# Patient Record
Sex: Male | Born: 1950 | Race: White | Hispanic: No | Marital: Married | Smoking: Current every day smoker
Health system: Southern US, Community
[De-identification: ages and names within clinical notes are randomized; demographics above are authoritative.]

## PROBLEM LIST (undated history)

## (undated) DIAGNOSIS — I10 Essential (primary) hypertension: Secondary | ICD-10-CM

## (undated) DIAGNOSIS — F32A Depression, unspecified: Secondary | ICD-10-CM

## (undated) DIAGNOSIS — J151 Pneumonia due to Pseudomonas: Secondary | ICD-10-CM

## (undated) DIAGNOSIS — R55 Syncope and collapse: Secondary | ICD-10-CM

## (undated) DIAGNOSIS — J449 Chronic obstructive pulmonary disease, unspecified: Secondary | ICD-10-CM

## (undated) DIAGNOSIS — F329 Major depressive disorder, single episode, unspecified: Secondary | ICD-10-CM

## (undated) DIAGNOSIS — E782 Mixed hyperlipidemia: Secondary | ICD-10-CM

## (undated) DIAGNOSIS — I480 Paroxysmal atrial fibrillation: Secondary | ICD-10-CM

## (undated) DIAGNOSIS — I6529 Occlusion and stenosis of unspecified carotid artery: Secondary | ICD-10-CM

## (undated) DIAGNOSIS — J9621 Acute and chronic respiratory failure with hypoxia: Secondary | ICD-10-CM

## (undated) DIAGNOSIS — I2510 Atherosclerotic heart disease of native coronary artery without angina pectoris: Secondary | ICD-10-CM

## (undated) DIAGNOSIS — R410 Disorientation, unspecified: Secondary | ICD-10-CM

## (undated) DIAGNOSIS — K219 Gastro-esophageal reflux disease without esophagitis: Secondary | ICD-10-CM

## (undated) HISTORY — DX: Essential (primary) hypertension: I10

## (undated) HISTORY — PX: INGUINAL HERNIA REPAIR: SHX194

## (undated) HISTORY — DX: Gastro-esophageal reflux disease without esophagitis: K21.9

## (undated) HISTORY — DX: Atherosclerotic heart disease of native coronary artery without angina pectoris: I25.10

## (undated) HISTORY — DX: Occlusion and stenosis of unspecified carotid artery: I65.29

## (undated) HISTORY — PX: ESOPHAGOGASTRODUODENOSCOPY (EGD) WITH ESOPHAGEAL DILATION: SHX5812

## (undated) HISTORY — PX: LEFT HEART CATH: SHX5946

## (undated) HISTORY — DX: Depression, unspecified: F32.A

## (undated) HISTORY — DX: Mixed hyperlipidemia: E78.2

## (undated) HISTORY — PX: VASECTOMY: SHX75

## (undated) HISTORY — DX: Syncope and collapse: R55

## (undated) HISTORY — DX: Chronic obstructive pulmonary disease, unspecified: J44.9

## (undated) HISTORY — PX: OTHER SURGICAL HISTORY: SHX169

## (undated) HISTORY — PX: CARDIAC CATHETERIZATION: SHX172

## (undated) HISTORY — DX: Major depressive disorder, single episode, unspecified: F32.9

---

## 1999-08-30 ENCOUNTER — Inpatient Hospital Stay (HOSPITAL_COMMUNITY): Admission: EM | Admit: 1999-08-30 | Discharge: 1999-09-03 | Payer: Self-pay | Admitting: *Deleted

## 2003-02-08 HISTORY — PX: ESOPHAGOGASTRODUODENOSCOPY (EGD) WITH ESOPHAGEAL DILATION: SHX5812

## 2003-02-08 HISTORY — PX: COLONOSCOPY: SHX174

## 2003-11-21 ENCOUNTER — Ambulatory Visit (HOSPITAL_COMMUNITY): Admission: RE | Admit: 2003-11-21 | Discharge: 2003-11-21 | Payer: Self-pay | Admitting: Internal Medicine

## 2003-12-24 ENCOUNTER — Ambulatory Visit: Payer: Self-pay | Admitting: Internal Medicine

## 2003-12-31 ENCOUNTER — Ambulatory Visit: Payer: Self-pay | Admitting: Internal Medicine

## 2004-01-07 ENCOUNTER — Ambulatory Visit: Payer: Self-pay | Admitting: Internal Medicine

## 2004-10-25 ENCOUNTER — Encounter: Payer: Self-pay | Admitting: Physician Assistant

## 2004-10-26 ENCOUNTER — Ambulatory Visit: Payer: Self-pay | Admitting: Cardiology

## 2004-10-27 ENCOUNTER — Inpatient Hospital Stay (HOSPITAL_COMMUNITY): Admission: AD | Admit: 2004-10-27 | Discharge: 2004-10-29 | Payer: Self-pay | Admitting: Internal Medicine

## 2004-10-28 ENCOUNTER — Ambulatory Visit: Payer: Self-pay | Admitting: Cardiology

## 2004-12-27 ENCOUNTER — Ambulatory Visit: Payer: Self-pay | Admitting: Cardiology

## 2005-12-27 ENCOUNTER — Encounter: Payer: Self-pay | Admitting: Cardiology

## 2005-12-27 ENCOUNTER — Ambulatory Visit: Payer: Self-pay | Admitting: Cardiology

## 2006-01-09 ENCOUNTER — Encounter: Payer: Self-pay | Admitting: Cardiology

## 2006-04-10 ENCOUNTER — Ambulatory Visit: Payer: Self-pay | Admitting: Cardiology

## 2007-03-21 ENCOUNTER — Encounter: Payer: Self-pay | Admitting: Cardiology

## 2008-11-19 ENCOUNTER — Encounter (INDEPENDENT_AMBULATORY_CARE_PROVIDER_SITE_OTHER): Payer: Self-pay | Admitting: *Deleted

## 2010-02-10 ENCOUNTER — Encounter: Payer: Self-pay | Admitting: Cardiology

## 2010-02-17 DIAGNOSIS — I10 Essential (primary) hypertension: Secondary | ICD-10-CM | POA: Insufficient documentation

## 2010-02-17 DIAGNOSIS — E785 Hyperlipidemia, unspecified: Secondary | ICD-10-CM | POA: Insufficient documentation

## 2010-02-18 ENCOUNTER — Ambulatory Visit
Admission: RE | Admit: 2010-02-18 | Discharge: 2010-02-18 | Payer: Self-pay | Source: Home / Self Care | Attending: Cardiology | Admitting: Cardiology

## 2010-02-18 ENCOUNTER — Encounter: Payer: Self-pay | Admitting: Cardiology

## 2010-02-18 ENCOUNTER — Encounter (INDEPENDENT_AMBULATORY_CARE_PROVIDER_SITE_OTHER): Payer: Self-pay | Admitting: *Deleted

## 2010-02-18 DIAGNOSIS — I200 Unstable angina: Secondary | ICD-10-CM | POA: Insufficient documentation

## 2010-02-18 DIAGNOSIS — I2510 Atherosclerotic heart disease of native coronary artery without angina pectoris: Secondary | ICD-10-CM | POA: Insufficient documentation

## 2010-02-18 DIAGNOSIS — F17200 Nicotine dependence, unspecified, uncomplicated: Secondary | ICD-10-CM | POA: Insufficient documentation

## 2010-02-19 ENCOUNTER — Encounter: Payer: Self-pay | Admitting: Cardiology

## 2010-02-25 ENCOUNTER — Encounter: Payer: Self-pay | Admitting: Cardiology

## 2010-02-25 ENCOUNTER — Ambulatory Visit
Admission: RE | Admit: 2010-02-25 | Payer: Self-pay | Source: Home / Self Care | Attending: Cardiology | Admitting: Cardiology

## 2010-03-02 ENCOUNTER — Telehealth (INDEPENDENT_AMBULATORY_CARE_PROVIDER_SITE_OTHER): Payer: Self-pay | Admitting: *Deleted

## 2010-03-02 ENCOUNTER — Encounter (INDEPENDENT_AMBULATORY_CARE_PROVIDER_SITE_OTHER): Payer: Self-pay | Admitting: *Deleted

## 2010-03-04 NOTE — H&P (Addendum)
  NAME:  Kyle Lowery, Kyle Lowery               ACCOUNT NO.:  [id number]  MEDICAL RECORD NO.:  [id number]          PATIENT TYPE:  OIB  LOCATION:  1965                         FACILITY:  [facility]  PHYSICIAN:  Rollene Rotunda, MD, FACCDATE OF BIRTH:  1950/06/16  DATE OF ADMISSION:  02/25/2010 DATE OF DISCHARGE:  02/25/2010                             HISTORY & PHYSICAL   PRIMARY:  Dr. Barbara Cower.  CARDIOLOGIST:  Jonelle Sidle, MD  PROCEDURE:  Left heart catheterization/coronary arteriography.  INDICATION:  Evaluate patient with chest pain and previous nonobstructive LAD stenosis.  PROCEDURE NOTE:  Left heart catheterization was performed via the right femoral artery.  The artery was cannulated using anterior wall puncture. A #4-French arterial sheath was inserted via the modified Seldinger technique.  Preformed Judkins and pigtail catheter were utilized.  The patient tolerated the procedure well, left the lab in stable condition. RESULTS:  Hemodynamics:  AO 109/78, LV 109/11.  Coronaries:  Left main was normal.  The LAD had a proximal 50% stenosis at the takeoff of a large first diagonal.  There was a long 25% mid to distal stenosis. First diagonal was large with ostial 80% stenosis.  The circumflex had a long proximal 60% stenosis in the AV groove.  This was after a small first obtuse marginal.  Obtuse marginal 1 and obtuse marginal 2 were small and normal.  Posterolateral was moderate-sized and normal.  The right coronary artery was dominant.  It was moderate-sized.  There was a long proximal 40% stenosis.  There was mid and distal long scattered 30% lesions.  The PDA was moderate size and normal.  Left ventriculogram: The left ventriculogram was obtained in the RAO projection.  The EF was 70% and normal.  CONCLUSION:  Single-vessel coronary artery disease at the diagonal branch.  Of note, I reviewed the previous catheterization.  There appeared to be a similar diagonal lesion though it was  difficult to view in the old images.  The LAD lesion itself appeared unchanged.  PLAN:  At this point, it would be very difficult to treat this ostial diagonal lesion with other than a cutting balloon.  The possibility for restenosis is high.  It would be prudent to further evaluate to see if there is ischemia in this distribution before proceeding with any percutaneous revascularization. If there is no ischemia on stress perfusion imaging, I would continue with aggressive medical management and only consider intervention if his symptoms worsen.     Rollene Rotunda, MD, [facility]     JH/MEDQ  D:  02/25/2010  T:  02/26/2010  Job:  [id number]  cc:   Jonelle Sidle, MD Dr. Barbara Cower  Electronically Signed by Rollene Rotunda MD [facility] on 03/04/2010 12:30:03 PM

## 2010-03-11 ENCOUNTER — Encounter: Payer: Self-pay | Admitting: Cardiology

## 2010-03-11 DIAGNOSIS — I2510 Atherosclerotic heart disease of native coronary artery without angina pectoris: Secondary | ICD-10-CM

## 2010-03-11 NOTE — Progress Notes (Signed)
Summary: PHONE:  Adrian Prows   Phone Note From Other Clinic   Caller: Trixie Rude CATH  Reason for Call: Schedule Patient Appt Summary of Call: Recv voice mail from [place] in [facility] cath Lab stating Dr. Antoine Poche wants to order Lexiscan cardiolite on Mr. Toto before his appt. on Feb. 14th with Dr. Diona Browner.  Initial call taken by: Zachary George,  March 02, 2010 11:05 AM

## 2010-03-11 NOTE — Letter (Signed)
Summary: Internal Correspondence/ FAXED PRE-CATH ORDER  Internal Correspondence/ FAXED PRE-CATH ORDER   Imported By: Dorise Hiss 02/26/2010 14:48:39  _____________________________________________________________________  External Attachment:    Type:   Image     Comment:   External Document

## 2010-03-11 NOTE — Cardiovascular Report (Signed)
Summary: CATHETERIZATION/ HISTORY & PHYSICAL  CATHETERIZATION/ HISTORY & PHYSICAL   Imported By: Zachary George 03/02/2010 10:50:07  _____________________________________________________________________  External Attachment:    Type:   Image     Comment:   External Document

## 2010-03-11 NOTE — Letter (Signed)
Summary: Lexiscan or Dobutamine Pharmacist, community at [facility]  [address]   [place], [place] [address]   Phone: [phone]  Fax: [phone]      [facility] Cardiovascular Services  Lexiscan or Dobutamine Cardiolite Strss Test    [place]  Appointment Date:_  Appointment Time:_  Your doctor has ordered a CARDIOLITE STRESS TEST using a medication to stimulate exercise so that you will not have to walk on the treadmill to determine the condition of your heart during stress. If you take blood pressure medication, ask your doctor if you should take it the day of your test. You should not have anything to eat or drink at least 4 hours before your test is scheduled, and no caffeine, including decaffeinated tea and coffee, chocolate, and soft drinks for 24 hours before your test.  You will need to register at the Outpatient/Main Entrance at the hospital 15 minutes before your appointment time. It is a good idea to bring a copy of your order with you. They will direct you to the Diagnostic Imaging (Radiology) Department.  You will be asked to undress from the waist up and given a hospital gown to wear, so dress comfortably from the waist down for example: Sweat pants, shorts, or skirt Rubber soled lace up shoes (tennis shoes)  Plan on about three hours from registration to release from the hospital  Hold Lisinopril/HCTZ  am of test. Otherwise take all meds with a sip of water.

## 2010-03-11 NOTE — Cardiovascular Report (Signed)
Summary: Cardiac Cath   Cardiac Cath   Imported By: Zachary George 02/11/2010 16:33:51  _____________________________________________________________________  External Attachment:    Type:   Image     Comment:   External Document

## 2010-03-11 NOTE — Consult Note (Signed)
Summary: CARDIOLOGY CONSULT-[facility]  [facility]ARDIOLOGY CONSULT-[facility]   Imported By: Zachary George 02/11/2010 16:33:16  _____________________________________________________________________  External Attachment:    Type:   Image     Comment:   External Document

## 2010-03-11 NOTE — Letter (Signed)
Summary: LOWELL NAPIER,MD - OFFICE NOTE  LOWELL NAPIER,MD - OFFICE NOTE   Imported By: Claudette Laws 02/12/2010 08:28:13  _____________________________________________________________________  External Attachment:    Type:   Image     Comment:   External Document

## 2010-03-11 NOTE — Letter (Signed)
Summary: Cardiac Cath Instructions - [facility]  [facility] [address]   [place], [place] [address]   Phone: [phone]  Fax: [phone]     02/18/2010 MRN: [id number]  SHAFT CORIGLIANO [address] [place], [place]  [address]  Dear [name],   You are scheduled for a Cardiac Catheterization on Thursday, February 25, 2010 with Dr. Riley Kill.  Please arrive to the 1st floor of the Heart and Vascular Center at [facility] at 0830 am on the day of your procedure. Please do not arrive before 6:30 a.m. Call the Heart and Vascular Center at [phone] if you are unable to make your appointment. The Code to get into the parking garage under the building is 0030. Take the elevators to the 1st floor. You must have someone to drive you home. Someone must be with you for the first 24 hours after you arrive home. Please wear clothes that are easy to get on and off and wear slip-on shoes. Do not eat or drink after midnight except water with your medications that morning. Bring all your medications and current insurance cards with you.  _X__ DO NOT take these medications before your procedure: Hold Lisinporil/HCTZ am of cath.  _X__ Make sure you take your aspirin.  _X__ You may take all of your remaining medications with water that morning.  ___ DO NOT take ANY medications before your procedure.  ___ Pre-med instructions:  ________________________________________________________________________  The usual length of stay after your procedure is 2 to 3 hours. This can vary.  If you have any questions, please call the office at the number listed above.  Cyril Loosen, RN, BSN           Directions to the [facility] Center [facility]  Please Note : Park in [place] under the building not the parking deck.  From [employer]: Turn onto [address] Left onto [place] (1st stoplight) Right at the brick entrance to the  hospital (Main circle drive) Bear to the right and you will see a blue sign "Heart and Vascular Center" Parking garage is a sharp right'to get through the gate out in the code _0030______. Once you park, take the elevator to the first floor. Please do not arrive before 0630am. The building will be dark before that time.   From [address] Turn onto [address] Turn left into the brick entrance to the hospital (Main circle drive) [place] to the right and you will see a blue sign "Heart and Vascular Center" Parking garage is a sharp right, to get thru the gate put in the code _0030___. Once you park, take the elevator to the first floor. Please do not arrive before 0630am. The building will be dark before that time

## 2010-03-11 NOTE — Assessment & Plan Note (Signed)
Summary: est - last seen 2008   Visit Type:  New patient visit Primary Provider:  Dr. Art Buff   History of Present Illness: 60 year old male presents to reestablish follow up. He was previously seen in our clinic, last in May of 2008 and recently established with Dr. Barbara Cower. He previously saw Dr. Sherryll Burger, although has had no regular medical followup for quite some time, on no medications at all for the last few years, reporting no health insurance until recently.  Recent followup labs showed cholesterol 235, HDL 47, LDL 165, triglycerides 113, creatinine 0.8, BUN 13, potassium 5.1, SGOT 19, SGPT 14, sodium 140, hemoglobin 15.9, platelets 266. All the medications outlined below were just started by Dr. Barbara Cower.  He reports a history of progressive shortness of breath with activity, and within the last 2 months increasing frequency in episodes of chest pain. He describes a "sharp stabbing" sensation on the left side of his chest, often with activity, however not exclusively. Symptoms last typically for a few minutes, up to 10 minutes at most, an are of moderate to severe intensity.   Prior cardiac catheterization results from 2006 are reviewed below, showing a 50% lesion within the proximal LAD, otherwise relatively mild disease.  He continues to smoke cigarettes heavily. We discussed smoking cessation. He has not been able to make any effective quit attempts as yet.  Significant blood pressure elevation also seems to be a more recent issue, the patient reporting no major problems controlling his blood pressure in the past, although as noted he has not had any regular followup until recently.  Preventive Screening-Counseling & Management  Alcohol-Tobacco     Smoking Status: current     Smoking Cessation Counseling: yes     Packs/Day: 2 PPD  Current Medications (verified): 1)  Lisinopril-Hydrochlorothiazide 20-12.5 Mg Tabs (Lisinopril-Hydrochlorothiazide) .... Take 1 Tablet By Mouth Once A  Day 2)  Nexium 40 Mg Cpdr (Esomeprazole Magnesium) .... Take 1 Tablet By Mouth Once A Day 3)  Simvastatin 20 Mg Tabs (Simvastatin) .... Take 1 Tablet By Mouth Once A Day  Allergies (verified): No Known Drug Allergies  Comments:  Nurse/Medical Assistant: The patient's medication bottles and allergies were reviewed with the patient and were updated in the Medication and Allergy Lists.  Past History:  Family History: Last updated: 02/18/2010 Father: suicide age 59 Mother: living age 19, hypertension and skin cancer Siblings: brother with hypertension  Social History: Last updated: 02/18/2010 Disabled  Married - third marriage Tobacco Use - Yes.   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  Packs/Day:  2 PPD  Review of Systems       The patient complains of chest pain, dyspnea on exertion, and headaches.  The patient denies anorexia, fever, weight gain, syncope, hemoptysis, abdominal pain, melena, and hematochezia.         Reports problems with regular indigestion, arthritis, migraine headaches, mild asthma, irritable bowel symptoms, depression. Just started on Nexium. No reported bleeding diathesis. No palpitations or syncope.   Vital Signs:  Patient profile:   60 year old male Height:  67 inches Weight:      135 pounds BMI:     21.22 Pulse rate:   99 / minute BP sitting:   162 / 95  (left arm) Cuff size:   regular  Vitals Entered By: Carlye Grippe (February 18, 2010 2:46 PM)  Physical Exam  Additional  Exam:  Chronically ill-appearing, disheveled male in no acute distress. Smells of cigarettes. HEENT: Conjunctiva and lids normal, oropharynx with poor dentition. Neck: Supple, no elevated JVP, no carotid bruits or thyromegaly. Lungs: Diminished breath sounds, and no wheezing, nonlabored breathing. Cardiac: Regular rate and rhythm, no S3 gallop or pericardial rub. S4.  Abdomen: Soft, nontender, bowel sounds present. Skin: Warm and dry. Extremities: No pitting edema, distal pulses one plus. Musculoskeletal: No kyphosis. Neuropsychiatric: Alert and oriented x3, affect appropriate.   Cardiac Cath  Procedure date:  10/28/2004  Findings:      Left main coronary artery:  The left main coronary artery was free of  significant disease.  Left anterior descending artery:  The left anterior descending gave rise to  a diagonal branch and two septal perforators.  There was 50% narrowing in  the proximal LAD.  Circumflex artery:  The circumflex artery gave rise to a marginal branch, a  second marginal branch and three posterolateral branches.  These vessels  were free of significant disease.  Right coronary artery:  The right coronary artery was a co-dominant vessel  that gave rise to a conus branch, a right ventricular branch and a posterior  descending branch.  There was 30% narrowing in the proximal right coronary  artery.   Left ventriculogram:  The left ventriculogram performed in the RAO  projection showed good wall motion with no areas of hypokinesis.  The  estimated ejection fraction was 60%.  EKG  Procedure date:  02/10/2010  Findings:      Sinus rhythm at 78 beats per minutes with increased voltage, nonspecific T wave changes.  Impression & Recommendations:  Problem # 1:  UNSTABLE ANGINA (ICD-411.1)  Progressive dyspnea on exertion and chest discomfort as outlined, symptoms increasing in frequency and intensity over the last few months to recent weeks. This is in the setting  of significant active cardiac risk factors including gender, tobacco abuse, uncontrolled hypertension, hyperlipidemia, and prior diagnosis of mild to moderate coronary artery disease by cardiac catheterization in 2006. Basic medical therapy has recently been initiated and symptoms continue. ECG is abnormal, however relatively nonspecific with evidence of increased voltage and nonspecific T wave changes. We discussed a variety of issues today including followup cardiac testing, however the main question is whether he has had progression in coronary atherosclerosis over the last several years that might benefit from revascularization in addition to resumption of basic medical therapy. Plan to add aspirin beginning at 81 mg daily, and also initiate Lopressor 25 mg p.o. b.i.d., with nitroglycerin as needed. We discussed the risk benefit profile of proceeding on to a diagnostic cardiac catheterization as an outpatient in the near future, and he is in agreement to proceed pending followup lab work. Recent chest x-ray noted.  Encouraged to seek more urgent medical attention in the ER if his symptoms suddenly worsen or become more prolonged in the interim.  His updated medication list for this problem includes:    Lisinopril-hydrochlorothiazide 20-12.5 Mg Tabs (Lisinopril-hydrochlorothiazide) .Marland Kitchen... Take 1 tablet by mouth once a day  Problem # 2:  TOBACCO ABUSE (ICD-305.1)  Importance of smoking cessation was discussed. He is not yet ready to consider a  focused quit attempt.  Problem # 3:  HYPERTENSION (ICD-401.9)  As yet not well-controlled, recently placed on medical therapy. As noted above, Lopressor is also being added. Sodium restriction discussed.  His updated medication list for this problem includes:    Lisinopril-hydrochlorothiazide 20-12.5 Mg Tabs (Lisinopril-hydrochlorothiazide) .Marland Kitchen... Take 1 tablet by mouth once a day    Metoprolol Tartrate 25 Mg Tabs (Metoprolol tartrate) .Marland Kitchen... Take one tablet by  mouth twice a day    Aspirin 81 Mg Tbec (Aspirin) .Marland Kitchen... Take one tablet by mouth daily  Problem # 4:  HYPERLIPIDEMIA (ICD-272.4)  Recent LDL 165, placed on statin therapy just recently by Dr. Barbara Cower.  His updated medication list for this problem includes:    Simvastatin 20 Mg Tabs (Simvastatin) .Marland Kitchen... Take 1 tablet by mouth once a day  Other Orders: T-Basic Metabolic Panel [id number]) T-CBC No Diff ([id number]) T-PTT ([id number]) T-Protime, Auto ([id number]) Cardiac Catheterization (Cardiac Cath)  Patient Instructions: 1)  Your physician recommends that you go to the [facility] for lab work. Do labs today, if possible. 2)  Your physician has requested that you have a cardiac catheterization.  Cardiac catheterization is used to diagnose and/or treat various heart conditions. Doctors may recommend this procedure for a number of different reasons. The most common reason is to evaluate chest pain. Chest pain can be a symptom of coronary artery disease (CAD), and cardiac catheterization can show whether plaque is narrowing or blocking your heart's arteries. This procedure is also used to evaluate the valves, as well as measure the blood flow and oxygen levels in different parts of your heart.  For further information please visit [email or web address]  Please follow instruction sheet, as given. 3)  Your physician discussed the risks, benefits and indications for preventive aspirin therapy. It is recommended that you start (or continue) taking 81 mg of aspirin a day. 4)  Your physician recommended you take 1 tablet (or 1 spray) under tongue at onset of chest pain; you may repeat every 5 minutes for up to 3 doses. If 3 or more doses are required, call 911 and proceed to the ER immediately. 5)  Start Lopressor (metoprolol) 25mg  by mouth two times a day. Prescriptions: METOPROLOL TARTRATE 25 MG TABS (METOPROLOL TARTRATE) Take one tablet by mouth twice a day  #60 x 3   Entered by:   Cyril Loosen, RN, BSN   Authorized by:   Loreli Slot, MD, [facility]   Signed by:   Cyril Loosen, RN, BSN on 02/18/2010   Method used:   Electronically to        [facility] (retail)       [address]       [place], [place]  [address]       Ph: [phone]       Fax: [phone]   RxID:   [id number]   Handout requested.

## 2010-03-15 ENCOUNTER — Telehealth (INDEPENDENT_AMBULATORY_CARE_PROVIDER_SITE_OTHER): Payer: Self-pay | Admitting: *Deleted

## 2010-03-17 NOTE — Letter (Signed)
Summary: Internal Other/ PATIENT HISTORY FORM  Internal Other/ PATIENT HISTORY FORM   Imported By: Dorise Hiss 03/10/2010 12:12:35  _____________________________________________________________________  External Attachment:    Type:   Image     Comment:   External Document

## 2010-03-23 ENCOUNTER — Encounter: Payer: Self-pay | Admitting: Cardiology

## 2010-03-23 ENCOUNTER — Ambulatory Visit (INDEPENDENT_AMBULATORY_CARE_PROVIDER_SITE_OTHER): Payer: Medicare Other | Admitting: Cardiology

## 2010-03-23 DIAGNOSIS — I10 Essential (primary) hypertension: Secondary | ICD-10-CM

## 2010-03-23 DIAGNOSIS — F17200 Nicotine dependence, unspecified, uncomplicated: Secondary | ICD-10-CM

## 2010-03-23 DIAGNOSIS — I2510 Atherosclerotic heart disease of native coronary artery without angina pectoris: Secondary | ICD-10-CM

## 2010-03-23 DIAGNOSIS — E782 Mixed hyperlipidemia: Secondary | ICD-10-CM

## 2010-03-25 NOTE — Progress Notes (Signed)
Summary: PHONE: WOULD LIKE TO S/W JENNI  Phone Note Call from Patient   Caller: CHARLOTTE-WIFE Reason for Call: Talk to Nurse Summary of Call: Would like to talk with Antony Contras regarding husbands test and phone call he received this am. [phone]  cell #  Initial call taken by: Zachary George,  March 15, 2010 12:19 PM  Follow-up for Phone Call        Pt's wife notified of results and verbalized understanding.  Follow-up by: Cyril Loosen, RN, BSN,  March 15, 2010 1:38 PM

## 2010-03-31 NOTE — Assessment & Plan Note (Signed)
Summary: eph post cath 1/19/ac   Visit Type:  Follow-up Primary Provider:  Dr. Art Buff   History of Present Illness: 60 year old male presents for followup. He was seen back in mid January with progressive shortness of breath and intermittent chest pain concerning for possible unstable angina in the setting of previously diagnosed mild to moderate coronary artery disease in 2006. He was referred for a diagnostic cardiac catheterization, outlined below. This study revealed essentially single-vessel disease involving a first diagonal branch with ostial 80% stenosis, otherwise continued evidence of mild to moderate atherosclerosis. He was referred for a followup Cardiolite to assess for potential ischemia that might respond to revascularization, and this revealed no large ischemic territories, suggesting medical therapy at this point.  Today I discussed the results of the patient's catheterization and his stress testing. He reports no progressive symptoms, and indicates a better understanding after our discussion. We reviewed the importance of medical therapy. He is cutting back smoking, now down to 4 cigarettes in 24 hours. I encouraged him to work towards complete cessation.  He has had some difficulty with medication adjustments, seemingly related to relative orthostasis and dizziness. We discussed modifications to be made, including a change in his statin regimen given ongoing use of amlodipine.  Preventive Screening-Counseling & Management  Alcohol-Tobacco     Smoking Status: current     Packs/Day: 0.25  Current Medications (verified): 1)  Lisinopril-Hydrochlorothiazide 20-12.5 Mg Tabs (Lisinopril-Hydrochlorothiazide) .... Take 1 Tablet By Mouth Once A Day 2)  Nexium 40 Mg Cpdr (Esomeprazole Magnesium) .... Take 1 Tablet By Mouth Once A Day 3)  Simvastatin 20 Mg Tabs (Simvastatin) .... Take 1 Tablet By Mouth Once A Day 4)  Metoprolol Tartrate 25 Mg Tabs (Metoprolol Tartrate) .... Take  One Tablet By Mouth Twice A Day 5)  Aspirin 81 Mg Tbec (Aspirin) .... Take One Tablet By Mouth Daily 6)  Nitrostat 0.4 Mg Subl (Nitroglycerin) .Marland Kitchen.. 1 Tablet Under Tongue At Onset of Chest Pain; You May Repeat Every 5 Minutes For Up To 3 Doses. 7)  Amlodipine Besylate 5 Mg Tabs (Amlodipine Besylate) .... Take 1 Tablet By Mouth Two Times A Day  Allergies (verified): No Known Drug Allergies  Comments:  Nurse/Medical Assistant: The patient's medications and allergies were reviewed with the patient and were updated in the Medication and Allergy Lists. Tammi Romine CMA (March 23, 2010 4:03 PM)  Past History:  Social History: Last updated: 02/18/2010 Disabled  Married - third marriage Tobacco Use - Yes.   Past Medical History: Mild to moderate nonobstructive CAD Hypertension Depression - prior suicide attempt G E R D - esophageal dilatation Possible neurocardiogenic syncope C O P D Hyperlipidemia  Social History: Packs/Day:  0.25  Review of Systems       The patient complains of dyspnea on exertion.  The patient denies anorexia, fever, weight loss, chest pain, syncope, peripheral edema, prolonged cough, melena, and hematochezia.         Otherwise reviewed and negative except as outlined.  Vital Signs:  Patient profile:   60 year old male Height:      67 inches Weight:      138 pounds BMI:     21.69 Pulse rate:   73 / minute BP sitting:   116 / 77  (left arm)  Vitals Entered By: Fuller Plan CMA (March 23, 2010 4:04 PM)  Physical Exam  Additional Exam:  Chronically ill-appearing male in no acute distress. HEENT: Conjunctiva and lids normal, oropharynx with poor dentition. Neck:  Supple, no elevated JVP, no carotid bruits or thyromegaly. Lungs: Diminished breath sounds, and no wheezing, nonlabored breathing. Cardiac: Regular rate and rhythm, no S3 gallop or pericardial rub. S4.  Abdomen: Soft, nontender, bowel sounds present. Skin: Warm and dry. Extremities: No  pitting edema, distal pulses one plus. Musculoskeletal: No kyphosis. Neuropsychiatric: Alert and oriented x3, affect appropriate.   Cardiac Cath  Procedure date:  02/26/2010  Findings:       PROCEDURE NOTE:  Left heart catheterization was performed via the right   femoral artery.  The artery was cannulated using anterior wall puncture.   A #4-French arterial sheath was inserted via the modified Seldinger   technique.  Preformed Judkins and pigtail catheter were utilized.  The   patient tolerated the procedure well, left the lab in stable condition.   RESULTS:  Hemodynamics:  AO 109/78, LV 109/11.  Coronaries:  Left main   was normal.  The LAD had a proximal 50% stenosis at the takeoff of a   large first diagonal.  There was a long 25% mid to distal stenosis.   First diagonal was large with ostial 80% stenosis.  The circumflex had a   long proximal 60% stenosis in the AV groove.  This was after a small   first obtuse marginal.  Obtuse marginal 1 and obtuse marginal 2 were   small and normal.  Posterolateral was moderate-sized and normal.  The   right coronary artery was dominant.  It was moderate-sized.  There was a   long proximal 40% stenosis.  There was mid and distal long scattered 30%   lesions.  The PDA was moderate size and normal.  Left ventriculogram:   The left ventriculogram was obtained in the RAO projection.  The EF was   70% and normal.   Nuclear Study  Procedure date:  03/11/2010  Findings:      Lexiscan Cardiolite demonstrating no diagnostic ST segment changes. Small, fixed defects noted in the mid anterior wall as well as basal to mid inferior wall, associated with normal wall motion, and LVEF 56%. No large ischemic zones were described.  Impression & Recommendations:  Problem # 1:  CORONARY ATHEROSCLEROSIS NATIVE CORONARY ARTERY (ICD-414.01)  At this point plan medical therapy. No clear indication for revascularization. He is symptomatically stable. I would  like to reduce his amlodipine to 5 mg once daily, and change Lopressor to Toprol-XL 25 mg once daily. Office followup will be arranged for the next 3 months.  The following medications were removed from the medication list:    Metoprolol Tartrate 25 Mg Tabs (Metoprolol tartrate) .Marland Kitchen... Take one tablet by mouth twice a day His updated medication list for this problem includes:    Lisinopril-hydrochlorothiazide 20-12.5 Mg Tabs (Lisinopril-hydrochlorothiazide) .Marland Kitchen... Take 1 tablet by mouth once a day    Aspirin 81 Mg Tbec (Aspirin) .Marland Kitchen... Take one tablet by mouth daily    Nitrostat 0.4 Mg Subl (Nitroglycerin) .Marland Kitchen... 1 tablet under tongue at onset of chest pain; you may repeat every 5 minutes for up to 3 doses.    Amlodipine Besylate 5 Mg Tabs (Amlodipine besylate) .Marland Kitchen... Take 1 tablet by mouth once daily.    Metoprolol Succinate 25 Mg Xr24h-tab (Metoprolol succinate) .Marland Kitchen... Take one tablet by mouth daily  Problem # 2:  TOBACCO ABUSE (ICD-305.1)  Discussed again today. Continue to work towards complete cessation.  Problem # 3:  HYPERLIPIDEMIA (ICD-272.4)  Given concurrent use of Norvasc, would suggest changing from simvastatin to generic Lipitor. Prescription is  provided. Can followup lipid profile liver function tests around the time of his next visit.  The following medications were removed from the medication list:    Simvastatin 20 Mg Tabs (Simvastatin) .Marland Kitchen... Take 1 tablet by mouth once a day His updated medication list for this problem includes:    Lipitor 10 Mg Tabs (Atorvastatin calcium) .Marland Kitchen... Take one tablet by mouth at bedtime.  Problem # 4:  HYPERTENSION (ICD-401.9)  Blood pressure is well-controlled.  The following medications were removed from the medication list:    Metoprolol Tartrate 25 Mg Tabs (Metoprolol tartrate) .Marland Kitchen... Take one tablet by mouth twice a day His updated medication list for this problem includes:    Lisinopril-hydrochlorothiazide 20-12.5 Mg Tabs  (Lisinopril-hydrochlorothiazide) .Marland Kitchen... Take 1 tablet by mouth once a day    Aspirin 81 Mg Tbec (Aspirin) .Marland Kitchen... Take one tablet by mouth daily    Amlodipine Besylate 5 Mg Tabs (Amlodipine besylate) .Marland Kitchen... Take 1 tablet by mouth once daily.    Metoprolol Succinate 25 Mg Xr24h-tab (Metoprolol succinate) .Marland Kitchen... Take one tablet by mouth daily  Patient Instructions: 1)  Change Norvasc (amlopidine) to 5mg  by mouth once daily. 2)  Finish current supply of Zocor (simvastatin) and then start Lipitor (atorvastatin) 10mg  by mouth at bedtime. 3)  Stop Lopressor (metoprolol tartrate) and start Toprol XL (metoprolol ER or succinate) 25mg  by mouth once daily. 4)  Follow up with Dr. Diona Browner on Tuesday, Jun 22, 2010 at 1:20pm. Prescriptions: METOPROLOL SUCCINATE 25 MG XR24H-TAB (METOPROLOL SUCCINATE) Take one tablet by mouth daily  #30 x 6   Entered by:   Cyril Loosen, RN, BSN   Authorized by:   Loreli Slot, MD, [facility]   Signed by:   Cyril Loosen, RN, BSN on 03/23/2010   Method used:   Electronically to        [facility] (retail)       [address]       [place], [place]  [address]       Ph: [phone]       Fax: [phone]   RxID:   [id number] LIPITOR 10 MG TABS (ATORVASTATIN CALCIUM) Take one tablet by mouth at bedtime.  #30 x 6   Entered by:   Cyril Loosen, RN, BSN   Authorized by:   Loreli Slot, MD, [facility]   Signed by:   Cyril Loosen, RN, BSN on 03/23/2010   Method used:   Electronically to        [facility] (retail)       [address]       [place], [place]  [address]       Ph: [phone]       Fax: [phone]   RxID:   [id number] AMLODIPINE BESYLATE 5 MG TABS (AMLODIPINE BESYLATE) Take 1 tablet by mouth once daily.  #30 x 6   Entered by:   Cyril Loosen, RN, BSN   Authorized by:   Loreli Slot, MD, [facility]   Signed by:   Cyril Loosen, RN, BSN on 03/23/2010   Method used:    Electronically to        [facility] (retail)       [address]       [place], [place]  [address]       Ph:  [phone]       Fax: [phone]   RxID:   [id number] NITROSTAT 0.4 MG SUBL (NITROGLYCERIN) 1 tablet under tongue at onset of chest pain; you may repeat every 5 minutes for up to 3 doses.  #25 x 3   Entered by:   Cyril Loosen, RN, BSN   Authorized by:   Loreli Slot, MD, [facility]   Signed by:   Cyril Loosen, RN, BSN on 03/23/2010   Method used:   Electronically to        [facility] (retail)       [address]       [place], [place]  [address]       Ph: [phone]       Fax: [phone]   RxID:   [id number]

## 2010-06-21 ENCOUNTER — Encounter: Payer: Self-pay | Admitting: Cardiology

## 2010-06-22 ENCOUNTER — Ambulatory Visit: Payer: Medicare Other | Admitting: Cardiology

## 2010-06-25 ENCOUNTER — Encounter: Payer: Self-pay | Admitting: Cardiology

## 2010-06-25 ENCOUNTER — Ambulatory Visit (INDEPENDENT_AMBULATORY_CARE_PROVIDER_SITE_OTHER): Payer: Medicare Other | Admitting: Cardiology

## 2010-06-25 VITALS — BP 129/85 | HR 90 | Ht 67.0 in | Wt 136.0 lb

## 2010-06-25 DIAGNOSIS — I2510 Atherosclerotic heart disease of native coronary artery without angina pectoris: Secondary | ICD-10-CM

## 2010-06-25 DIAGNOSIS — I10 Essential (primary) hypertension: Secondary | ICD-10-CM

## 2010-06-25 DIAGNOSIS — E785 Hyperlipidemia, unspecified: Secondary | ICD-10-CM

## 2010-06-25 DIAGNOSIS — F17200 Nicotine dependence, unspecified, uncomplicated: Secondary | ICD-10-CM

## 2010-06-25 MED ORDER — PRAVASTATIN SODIUM 20 MG PO TABS: 20.0000 mg | ORAL_TABLET | Freq: Every evening | ORAL | Status: DC
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
## 2010-06-25 NOTE — H&P (Signed)
[facility]  Patient:    Kyle Lowery, Kyle Lowery                        MRN: [id number] Adm. Date:  [id number] Attending:  Otilio Saber                   Psychiatric Admission Assessment  NO DICTATION. DD:  08/31/99 TD:  09/01/99 Job: [id number]

## 2010-06-25 NOTE — Discharge Summary (Signed)
NAMEBEVERLEY, SHERRARD               ACCOUNT NO.:  [id number]   MEDICAL RECORD NO.:  [id number]          PATIENT TYPE:  INP   LOCATION:  2008                         FACILITY:  [facility]   PHYSICIAN:  Charlies Constable, M.D. [facility] DATE OF BIRTH:  May 16, 1950   DATE OF ADMISSION:  10/27/2004  DATE OF DISCHARGE:  10/29/2004                                 DISCHARGE SUMMARY   PROCEDURES:  1.  Cardiac catheterization.  2.  Coronary arteriogram.  3.  Left ventriculogram.  4.  Lower extremity ultrasound.   DISCHARGE DIAGNOSES:  1.  Chest pain, no critical coronary artery disease by cath, LAD 50% and RCA      30%.  2.  Preserved left ventricular function with an ejection fraction of 60% at      cath.  3.  Hypertension.  4.  History of gastroesophageal reflux disease and stricture, status post      dilatation.  5.  History of depression and previous suicide attempts.  6.  Probable chronic obstructive pulmonary disease.  7.  Greater than 50 pack-year history of tobacco use.  8.  Status post left inguinal hernia repair and left orchiectomy.  9.  Urine drug screen positive for cannabinoids.  10. Neck discomfort.  11. Mild renal insufficiency with a creatinine of 1.7 at [facility] and 0.9 at      discharge.   HOSPITAL COURSE:  Mr. Kimmey is a 60 year old male with no known history  of coronary artery disease.  He went to [facility] for chest pain and  was evaluated there by Dr. Diona Browner.  His symptoms were concerning for  anginal pain and he was transferred to [facility] for further  evaluation and treatment.   His cardiac enzymes were negative for MI and a cardiac catheterization was  performed to further evaluate his anatomy.  Cardiac catheterization showed a  50% LAD and a 30% RCA with a  normal EF and no stenosis on the abdominal  aortogram.   Mr. Safran's chest pain resolved.  He had complaints of right groin pain  and numbness, although pinprick test was within normal  limits.  An  ultrasound was performed which showed no pseudoaneurysm or AV fistula.  His  pain had improved somewhat and he was ambulating but stated it still got  very sore when he was walking or when he coughed.  He was evaluated and  considered stable for discharge on October 29, 2004 with outpatient follow-  up arranged.   DISCHARGE INSTRUCTIONS:   ACTIVITY:  His activity level is to be per the cath discharge instruction  sheet.   FOLLOW UP:  He is to call our office for any problems with the cath site.  He is follow up with Arnette Felts for Dr. Diona Browner on October 4 at 2:15 and  with Dr. Clelia Croft as well.   DISCHARGE MEDICATIONS:  1.  AcipHex daily.  2.  Celexa daily.  3.  Lisinopril 10 mg daily.  4.  Toprol XL one half tab daily for 4 days and then increase to one tab      daily.  5.  Zocor 40 mg daily.      Theodore Demark, P.A. [facility]    ______________________________  Charlies Constable, M.D. [facility]    RB/MEDQ  D:  10/29/2004  T:  10/31/2004  Job:  [id number]   cc:   Dr. Magnus Ivan  [place], [place]

## 2010-06-25 NOTE — Assessment & Plan Note (Signed)
Continue to work towards smoking cessation. We discussed nicotine replacement, also the Quit line.

## 2010-06-25 NOTE — Assessment & Plan Note (Signed)
Blood pressure control is reasonable today. No changes made. 

## 2010-06-25 NOTE — Consult Note (Signed)
NAME:  Kyle Lowery, Kyle Lowery NO.:  [id number]   MEDICAL RECORD NO.:  [id number]           PATIENT TYPE:   LOCATION:                                 FACILITY:   PHYSICIAN:  Lionel December, M.D.    DATE OF BIRTH:  1950-11-08   DATE OF CONSULTATION:  10/30/2003  DATE OF DISCHARGE:                                   CONSULTATION   PHYSICIAN REQUESTING CONSULTATION:  Earleen Newport, Rochel Brome., with [facility].   REASON FOR CONSULTATION:  Hematochezia.   HISTORY OF PRESENT ILLNESS:  Kyle Lowery is a 60 year old Caucasian gentleman who  presents with a nine month history of hematochezia.  He states that  sometimes it is moderate in volume.  He has noted bleeding fresh blood with  bowel movements, even when his bowels are soft.  He often has sharp, ripping  type pain in the rectum with bowel movements.  He notes blood in his  undergarments at time.  He recently had a prostate exam by Dr. Jerre Simon and  said that he bled quite a bit after this exam and had a lot of pain with  this exam.  He denies any constipation, diarrhea.  He has lower abdominal  discomfort at times, which he feels is related to urinary retention.  He has  urinary hesitancy.  He is seeing Dr. Jerre Simon for this.  He also has chronic  gastroesophageal reflux of more than 10 years duration.  He has been on  Prevacid, most recently, which did not seem to help.  He currently is using  Alka-Seltzer, Rolaids or Tums p.r.n.  He denies any dysphagia or  odynophagia.  He has heartburn on a daily basis.  He denies any hematemesis,  nausea or vomiting.  He has never had an EGD or colonoscopy.  He consumes  approximately a six pack of beer weekly.  He says that previously he was a  heavy alcohol user.   CURRENT MEDICATIONS:  1.  Hydrochlorothiazide 25 mg q.d.  2.  Propranolol 10 mg q. 4h.  3.  Covera HS 240 mg q.d.  4.  Zoloft 100 mg q.d.  5.  BC Powders three weekly.   ALLERGIES:  No known drug  allergies.   PAST MEDICAL HISTORY:  1.  Hypertension.  2.  COPD.  3.  Depression.  4.  Angina.  5.  History of alcohol abuse.  6.  Status post two left inguinal hernia repairs.  7.  He had his left testicle removed after complications due to a vasectomy.   FAMILY HISTORY:  Negative for colorectal cancer or chronic GI illnesses.   SOCIAL HISTORY:  He is separated.  He is disabled due to pain related from  his testicular surgery.  He smokes two packs of cigarettes daily.  He  consumes three to four beers two times a week.   REVIEW OF SYSTEMS:  Please see HPI for GI.  CARDIOPULMONARY:  Denies any  chest pain.  Denies any shortness of breath.  GENITOURINARY:  Complains of  urinary hesitancy.  No hematuria.   PHYSICAL EXAMINATION:  VITAL SIGNS:  Weight 152.5; height 5 ft 8 in.;  temperature 98.1; blood pressure 128/82; pulse 84.  GENERAL:  Pleasant, well-nourished, well-developed, Caucasian male in no  acute distress.  SKIN:  Warm and dry.  No jaundice.  HEENT:  Pupils equal, round and reactive to light.  Conjunctivae are pink.  Sclerae nonicteric.  Oropharyngeal mucosa moist and pink.  No lesions,  erythema or exudate.  No lymphadenopathy, thyromegaly.  CHEST:  Lungs are clear to auscultation.  CARDIAC:  Regular rate and rhythm.  Normal S1, S2.  No murmurs, rubs or  gallops.  ABDOMEN:  Positive bowel sounds.  Soft, nondistended.  He has mild to  moderate tenderness in the epigastric region to deep palpation.  No  organomegaly or masses.  EXTREMITIES:  No edema.  RECTAL:  Deferred to time of colonoscopy.   LABORATORIES:  Jun 23, 2003 Sodium 140, potassium 3.9, BUN 8, creatinine  0.9.  LFTs normal.   IMPRESSION:  Kyle Lowery is a 60 year old gentleman with a nine month history of  hematochezia.  Based on symptoms of rectal pain with defecation as well as  rectal pain at time of prostate examination recently, I suspect that he may  have an anal/rectal fissure.  Given the chronicity of  his symptoms I highly  recommend colonoscopy for further evaluation.  The patient is agreeable.  In  addition, he has chronic gastroesophageal reflux disease with symptoms  poorly controlled on Prevacid.  He also has epigastric pain.  With ongoing  alcohol use, we would have to be concerned about alcoholic gastritis.  He is  also on [facility] Powders regularly.  I cannot rule out peptic ulcer disease.  I  also need to evaluate for complications such as Barrett's esophagus.   PLAN:  1.  EGD and colonoscopy in the near future.  2.  Aciphex 20 mg daily, No. 4 boxes of samples given.  3.  [facility] apply anorectally b.i.d., No. 2 samples given.  4.  Further recommendations to follow.     ____________________  Lionel December, M.D.     ____________________  Tana Coast, P.A.      ________________________________________  Tana Coast, P.A.  ___________________________________________  Lionel December, M.D.    LL/MEDQ  D:  10/30/2003  T:  10/30/2003  Job:  [id number]   cc:   Earleen Newport, P.A.  [facility]

## 2010-06-25 NOTE — Assessment & Plan Note (Signed)
[facility]                          [facility] OFFICE NOTE   NAME:Kyle Lowery, Kyle Lowery                      MRN:          [id number]  DATE:04/10/2006                            DOB:          [birth year]    PRIMARY CARE PHYSICIAN:  Kirstie Peri, M.D.   REASON FOR VISIT:  General cardiac followup.   HISTORY OF PRESENT ILLNESS:  Mr. Boakye has not been seen since  November 2006.  He was scheduled for routine visit with a prior  documented history of moderate coronary artery disease and previous  history of syncope.  He was felt to have possible neurocardiogenic  syncope in the past.  He states that he had a black out episode back  in November 2007.  He was apparently standing and became dizzy, running  into a wall according to his wife and ultimately passing out.  He was  reportedly seen in the emergency department and diagnosed with  anxiety, although he was also noted to have a low blood pressure and  was taken off of his blood pressure medications at that time.  His wife  states that she has made him stop drinking alcohol.  And, reports that  he has actually done very well in the last several months.  He denies  any problems with angina or dyspnea.  He has a 22-year-old grandson that  he chases around without limiting symptoms.  He is bothered by reflux  in the mornings.  His electrocardiogram today shows a sinus rhythm with  left atrial enlargement.  Today, we discussed his prior history of  cardiovascular disease and recommendations for continued efforts at risk  factor modification.  I let him know that I would sent recommendations  to Dr. Sherryll Burger for followup.   ALLERGIES:  NO KNOWN DRUG ALLERGIES.   PRESENT MEDICATIONS:  1. Nexium 40 mg p.o. every day.  2. Paxil 20 mg p.o. every day.  3. He uses Tylenol p.r.n.   REVIEW OF SYSTEMS:  As described in the history of present illness.   PHYSICAL EXAMINATION:  VITAL SIGNS:  Blood pressure today  is 135/96,  heart rate is 81, weight is 134 pounds.  GENERAL:  The patient is comfortable and in no acute distress.  HEENT:  Conjunctivae and lids are normal.  Pharynx is clear.  NECK:  Supple without elevated jugular venous pressure .  No bruits.  No  thyromegaly.  LUNGS:  Clear without labored breathing.  CARDIAC:  Reveals a regular rate and rhythm without rub, murmur, or  gallop.  No pericardial rub.  ABDOMEN;  Soft, nontender.  No bruits.  EXTREMITIES:  Show no significant pitting edema.   IMPRESSION/RECOMMENDATION:  1. Previously documented history of coronary artery disease, moderate      with normal ejection fraction in the 60% range.  I note a      Cardiolite study from November 2007, interpreted by Dr. Myrtis Ser to      show an ejection fraction of 64%, with a questionable area of      inferior basal ischemia, although felt not to be a marked  abnormality.  In the absence of any active chest pain and based on      his previously documented history of coronary atherosclerosis, I      would recommend general risk factor modification at this point.      Smoking cessation was discussed and I congratulated the patient on      discontinuing alcohol use.  I did ask him to follow up with Dr.      Sherryll Burger regarding his blood pressure as it may well be that he needs      further medical therapy.  He was previously taking Lisinopril HCTZ      and Toprol XL but was taken off of these medicines due to reported      hypotension.  I also think following up on his lipid profile and      considering Statin therapy as well as a low dose aspirin would be      quite reasonable.  We will otherwise plan a general followup in 59 year's time.  He will continue to follow with Dr. Sherryll Burger in the      meanwhile.  2. Possible history of neurocardiogenic syncope as discussed      previously.     Jonelle Sidle, MD  Electronically Signed    SGM/MedQ  DD: 04/10/2006  DT: 04/10/2006  Job #:  [id number]   cc:   Kirstie Peri, MD

## 2010-06-25 NOTE — Assessment & Plan Note (Signed)
Will review labs from Dr. Barbara Cower. Plan to discontinue Lipitor and try Pravachol 20 mg daily to see if he tolerates this better. If so, followup fasting lipid profile and liver function tests down the road.

## 2010-06-25 NOTE — Discharge Summary (Signed)
[facility]  Patient:    Kyle Lowery, Kyle Lowery                        MRN: [id number] Adm. Date:  [id number] Disc. Date: [id number] Attending:  Otilio Saber                           Discharge Summary  INTRODUCTION:  Nino Amano is a 60 year old white divorced male from [place] area.  Patient was admitted because of symptoms of depression, having suicidal thoughts and not being able to function.  The details of admission circumstances are available in the patients chart.  Patient prior to admission was drinking heavily and was admitted with the idea of detoxifying him from alcohol and treating him for depression.  HOSPITAL COURSE:  After admitting him to the ward, patient was placed on special observation he was started on a Librium detox regimen, from 25 mg four times a day to 25 mg daily on the fourth day.  He also received thiamine IM 100 mg and later thiamine 100 mg daily and multivitamins.  At the beginning of hospitalization, patients vital signs were slightly elevated with blood pressure ranging from 165/97 to 150/93.  In the course of hospitalization, however, patient started doing better and withdrawal symptoms subsided and his blood pressure normalized to the level of 120/80.  In the second day in the hospital, I started the patient on Prozac 20 mg daily, which gradually started helping with depressive symptoms.  Because of insomnia, he was started on Trazodone 50 mg at bedtime, which was also helpful.  Gradually, patient got better, did not have any more passive death wishes, and felt that he could handle his difficulties better.  On the day of discharge, he denied any dangerous ideations, was free from psychotic features, and showed more insight into his condition as well as willingness to stay sober.  MEDICAL INVOLVEMENT:  Patient has history of hypertension and peptic ulcer disease.  While on the unit, he developed acute abdominal pain  which warranted emergency evaluation by medical staff.  The conclusion was that patient suffers from gastroesophageal reflux disease and appropriate medications were prescribed.  Gradually, he got better on a combination of Zantac, Prilosec and Protonix.  Prior to discharge, I started patient on Revia for management of alcohol dependence, and patient tolerated this medication well.  During this stay, patient had an upper GI test done, the results of which are still pending, and will be sent to patients family doctor after discharge.  CBC was essentially normal, with increased of MCV to 102.2.  Electrolytes and liver function tests were normal.  Thyroid function tests were normal with a slightly low level of T4 at 4.3, with normal range between 4.5 and 10.9. Comprehensive metabolic panel was normal.  Patient had amylase lipase level and CKMB which showed normal results.  EKG was essentially within normal limits.  DISCHARGE RECOMMENDATIONS:  The patient has an appointment with [facility] on Tuesday, September 07, 1999 at 1:30 p.m.  He also is already involved with [facility] drug and alcohol treatment facility. He is supposed to follow with his family doctor, Dr. Anne Hahn, after discharge to check blood work, especially liver panel, since he is on Revia, within the next two to three weeks.  If any deterioration, he should call mental health center.  Patient should also attend AA meetings.  DISCHARGE MEDICATIONS:  Prescriptions were  given for the following medications: 1. Prozac 20 one in the morning. 2. Trazodone 50 mg one at bedtime. 3. Revia 50 mg daily. 4. Protonix 40 mg daily. 5. Verapamil sustained release 120 mg daily. 6. Patient has own supply of his blood pressure medication Biso5/HCTV 6.25 1    daily and Zantac 150 mg twice a day.  He may return to work on August 1.  Patient was informed about side effects of medications and about being careful to  operating motor vehicles on these medications.  DISCHARGE DIAGNOSES: Axis I:    1. Major depression, recurrent, moderate to severe.            2. Alcohol dependence. Axis II:   No diagnosis. Axis III:  Arterial hypertension, peptic ulcer disease, gastroesophageal            reflux disease. Axis IV:   Psychosocial stressors severe, problems with primary support group            and economic problems. Axis V:    Global assessment of function at discharge 50, maximum estimated            for past year was 50, upon admission 35.  PROGNOSIS:  Guarded and largely depends on patient compliance with sobriety program and compliance with prescribed treatment. DD:  09/03/99 TD:  09/06/99 Job: [id number]

## 2010-06-25 NOTE — Assessment & Plan Note (Signed)
Symptomatically stable on medical therapy. We continue to discuss diet and exercise, smoking cessation, as well as the importance of medical therapy. Continue observation.

## 2010-06-25 NOTE — Progress Notes (Signed)
Clinical Summary Kyle Lowery is a 60 y.o.male presenting for routine followup. He reports no progressive chest pain or shortness of breath. Indicates followup with Dr. Barbara Cower with lab work since our last visit.   He does report concerns about weakness and fatigue, some joint pain on low-dose Lipitor.  Followup Cardiolite is noted below. Plan is to continue medical therapy at this point for CAD.   He continues to smoke cigarettes, has cut back to a half pack per day. We continue to address strategies for smoking cessation.   No Known Allergies  Current outpatient prescriptions:amLODipine (NORVASC) 5 MG tablet, Take 5 mg by mouth daily.  , Disp: , Rfl: ;  aspirin 81 MG tablet, Take 81 mg by mouth daily.  , Disp: , Rfl: ;  doxycycline (VIBRAMYCIN) 100 MG capsule, Take 100 mg by mouth 2 (two) times daily.  , Disp: , Rfl: ;  esomeprazole (NEXIUM) 40 MG capsule, Take 40 mg by mouth daily before breakfast.  , Disp: , Rfl:  lisinopril-hydrochlorothiazide (PRINZIDE,ZESTORETIC) 20-12.5 MG per tablet, Take 1 tablet by mouth daily.  , Disp: , Rfl: ;  metoprolol succinate (TOPROL-XL) 25 MG 24 hr tablet, Take 25 mg by mouth daily.  , Disp: , Rfl: ;  nitroGLYCERIN (NITROSTAT) 0.4 MG SL tablet, Place 0.4 mg under the tongue every 5 (five) minutes as needed.  , Disp: , Rfl:  promethazine (PHENERGAN) 25 MG tablet, Take 25 mg by mouth every 6 (six) hours as needed.  , Disp: , Rfl: ;  DISCONTD: atorvastatin (LIPITOR) 10 MG tablet, Take 10 mg by mouth daily.  , Disp: , Rfl: ;  pravastatin (PRAVACHOL) 20 MG tablet, Take 1 tablet (20 mg total) by mouth every evening., Disp: 30 tablet, Rfl: 6  Past Medical History  Diagnosis Date  . Coronary atherosclerosis of native coronary artery     Mild to moderate NOCAD with 80% ostial diagonal 1/12  . Essential hypertension, benign   . Depression     Prior suicide attempt  . GERD (gastroesophageal reflux disease)     Esophageal dilatation  . Syncope     Neurocardiogenic    . COPD (chronic obstructive pulmonary disease)   . Mixed hyperlipidemia     Social History Mr. Patry reports that he has been smoking Cigarettes.  He has a 19 pack-year smoking history. He has never used smokeless tobacco. Mr. Coker reports that he does not drink alcohol.  Review of Systems No reported bleeding problems, palpitations, dizziness, syncope. Otherwise reviewed and negative except as outlined.  Physical Examination Filed Vitals:   06/25/10 0848  BP: 129/85  Pulse: 90  Chronically ill-appearing male in no acute distress.  HEENT: Conjunctiva and lids normal, oropharynx with poor dentition.  Neck: Supple, no elevated JVP, no carotid bruits or thyromegaly.  Lungs: Diminished breath sounds, and no wheezing, nonlabored breathing.  Cardiac: Regular rate and rhythm, no S3 gallop or pericardial rub. S4.  Abdomen: Soft, nontender, bowel sounds present.  Skin: Warm and dry.  Extremities: No pitting edema, distal pulses one plus.  Musculoskeletal: No kyphosis.  Neuropsychiatric: Alert and oriented x3, affect appropriate.   Studies Lexiscan Cardiolite 03/11/2010: No diagnostic ST segment changes. Small, fixed defects noted in the mid anterior wall as well as basal to mid inferior wall, associated with normal wall motion, and LVEF 56%. No large ischemic zones were described.   Problem List and Plan

## 2010-06-25 NOTE — Op Note (Signed)
NAME:  Kyle Lowery, Kyle Lowery               ACCOUNT NO.:  [id number]   MEDICAL RECORD NO.:  [id number]          PATIENT TYPE:  AMB   LOCATION:  DAY                           FACILITY:  [facility]   PHYSICIAN:  Lionel December, M.D.    DATE OF BIRTH:  1950-10-14   DATE OF PROCEDURE:  11/21/2003  DATE OF DISCHARGE:                                 OPERATIVE REPORT   PROCEDURES:  Esophagogastroduodenoscopy with esophageal dilation, followed  by colonoscopy with polypectomy.   INDICATIONS:  Fain is a 60 year old Caucasian male with a nine-month history  of intermittent hematochezia.  He also has chronic GERD.  He has been on  four different PPIs, which have not helped.  He also complains of  intermittent solid food dysphagia.  He is therefore undergoing both these  studies.  The procedure risks were reviewed with the patient.  Informed  consent was obtained.   PREMEDICATION:  Cetacaine spray for pharyngeal topical anesthesia, Demerol  50 mg IV, Versed 7 mg IV in divided dose.   FINDINGS:  Procedure performed in endoscopy suite.  The patient's vital  signs and O2 saturation were monitored during the procedure and remained  stable.   PROCEDURE #1:  Esophagogastroduodenoscopy.   The patient was placed in the left lateral recumbent position and the  Olympus video scope was passed via oropharynx without any difficulty into  esophagus.   Esophagus:  Mucosa of the esophagus was normal.  Squamocolumnar junction was  unremarkable.  No hernia was noted.  Also did not see any ring or stricture.   Stomach:  It was empty and distended very well with insufflation.  Folds of  the proximal stomach were normal.  Examination of the mucosa revealed antral  erythema and markedly swollen prepyloric fold extending into pyloric  channel.  There was no ulcer in this area.  The pyloric channel was patent,  though I was able to pass a scope across it without any difficulty.  Angularis, fundus and cardia were examined by  retroflexing the scope and  were normal.   Duodenum:  Examination of the bulb revealed normal mucosa.  The scope was  passed to the second part of the duodenum, where the mucosa and folds were  normal.  The endoscope was withdrawn.   The esophagus was dilated by passing 56 [place] Maloney dilator to full  insertion.  As the dilator was withdrawn, the endoscope was passed again and  there was no mucosal injury noted at esophagus.  Endoscope was withdrawn and  patient prepared for procedure #2.   PROCEDURE #2:  Colonoscopy.   Rectal examination performed.  No abnormality noted on external or digital  exam.  The Olympus video scope was placed in rectum and advanced under  vision into sigmoid colon and beyond.  Preparation was satisfactory.  The  scope was passed to the cecum, which was identified by the ileocecal valve  and appendiceal orifice.  Pictures taken for the record.  As the scope was  withdrawn, colonic mucosa was carefully examined.  There were two small  polyps at midtransverse colon.  One was round,  another one was somewhat  elongated.  The smaller one was about 5 mm, another one was about 4 x 6.  Both of these were snared and retrieved for histologic examination.  There  was a third polyp in the midsigmoid colon, which was cold snared.  This was  also small, 3-4 cm.  The mucosa of the rest of the colon was normal.  Rectal  mucosa similarly was normal.  The scope was retroflexed to examine the  anorectal junction, and small hemorrhoids were noted below the dentate line.  The endoscope was straightened and withdrawn.  The patient tolerated the  procedures well.   FINAL DIAGNOSES:  1.  Normal examination of the esophagus.  Esophagus dilated by passing 56      [place] Maloney dilator, given history of solid food dysphagia.  2.  Antral gastritis with markedly swollen, erythematous fold.  Pyloric      channel was also involved in this inflammatory process, possibly benign.       Biopsy taken from this fold.  3.  Three small polyps snared, two were hot snared from transverse colon and      a third one was cold snared from sigmoid colon.  4.  Small external hemorrhoids, felt to be source of intermittent      hematochezia.   RECOMMENDATIONS:  1.  Antireflux measures stressed with the patient.  2.  No ASA for 10 days.  3.  Will try him on Protonix 40 mg p.o. b.i.d.  He will pick up samples from      the office.  Note that he has already tried other PPIs and he tells me      that they have not helped.  4.  H. pylori serology will be checked today.     Naje   NR/MEDQ  D:  11/21/2003  T:  11/21/2003  Job:  [id number]   cc:   Earleen Newport, P.A.  [facility] Department

## 2010-06-25 NOTE — Patient Instructions (Signed)
Your physician wants you to follow-up in: 4 months. You will receive a reminder letter in the mail one-two months in advance. If you don't receive a letter, please call our office to schedule the follow-up appointment. Stop Lipitor. Start Pravachol (pravastatin) 20mg  every night. Your physician discussed the hazards of tobacco use. Tobacco use cessation is recommended and techniques and options to help you quit were discussed.

## 2010-06-25 NOTE — Cardiovascular Report (Signed)
NAME:  JUVENTINO, PAVONE NO.:  [id number]   MEDICAL RECORD NO.:  [id number]          PATIENT TYPE:  INP   LOCATION:  2008                         FACILITY:  [facility]   PHYSICIAN:  Charlies Constable, M.D. [facility] DATE OF BIRTH:  01-19-1951   DATE OF PROCEDURE:  10/28/2004  DATE OF DISCHARGE:                              CARDIAC CATHETERIZATION   CLINICAL HISTORY:  Mr. Jacobsen is 60 years old, has no prior history of  known heart disease.  He was mowing his lawn and developed chest pain and  then blacked out.  He was seen by Suszanne Conners. Julious Oka., and Jonelle Sidle, M.D., at [facility].  His troponin was borderline at 0.8.  His ECG was normal.  He had an echocardiogram, which showed good LV  function.  He was transferred here for further evaluation with angiography.   PROCEDURE:  The procedure was performed via the right femoral artery using  an arterial sheath and 6 French preformed coronary catheters.  A front-wall  anterior puncture was performed and Omnipaque contrast was used.  The right  femoral artery was closed with Angio-Seal at the end of the procedure.  The  patient tolerated the procedure well and left the laboratory in satisfactory  condition.  A distal aortogram was performed to rule out renovascular causes  for hypertension.  The patient tolerated the procedure well and left the  laboratory in satisfactory condition.   RESULTS:  The aortic pressure was 174/100 with a mean of 132 and left  ventricular pressure was 174/21.   Left main coronary artery:  The left main coronary artery was free of  significant disease.  Left anterior descending artery:  The left anterior descending gave rise to  a diagonal branch and two septal perforators.  There was 50% narrowing in  the proximal LAD.  Circumflex artery:  The circumflex artery gave rise to a marginal branch, a  second marginal branch and three posterolateral branches.  These vessels  were free of  significant disease.  Right coronary artery:  The right coronary artery was a co-dominant vessel  that gave rise to a conus branch, a right ventricular branch and a posterior  descending branch.  There was 30% narrowing in the proximal right coronary  artery.   Left ventriculogram:  The left ventriculogram performed in the RAO  projection showed good wall motion with no areas of hypokinesis.  The  estimated ejection fraction was 60%.   Distal aortogram:  A distal aortogram was performed, which showed patent  renal arteries and no significant aortoiliac obstruction.   CONCLUSION:  Nonobstructive coronary artery disease with 50% narrowing in  the proximal left anterior descending artery, no significant obstruction in  the circumflex artery, 30% narrowing in the proximal right coronary artery,  and normal left ventricular function.   RECOMMENDATIONS:  In view of these findings, I think the patient's recent  symptoms were not ischemic.  He most probably had a vasovagal syncope.  Will  plan reassurance and plan discharge tomorrow.           ______________________________  Smitty Cords  Juanda Chance, M.D. [facility]     BB/MEDQ  D:  10/28/2004  T:  10/29/2004  Job:  [id number]   cc:   Kirstie Peri, MD  [address]  [place] [address]  Fax: [phone]   Jonelle Sidle, M.D. [facility]  [address]  [place]  [place] [address]   [facility]   Cardiopulmonary Lab   Arvilla Meres, M.D. [facility]  [facility]  [address]  [place]  [place] [address]

## 2010-07-15 DIAGNOSIS — R079 Chest pain, unspecified: Secondary | ICD-10-CM

## 2010-08-13 ENCOUNTER — Encounter: Payer: Self-pay | Admitting: Cardiology

## 2010-08-13 ENCOUNTER — Ambulatory Visit (INDEPENDENT_AMBULATORY_CARE_PROVIDER_SITE_OTHER): Payer: Medicare Other | Admitting: Cardiology

## 2010-08-13 VITALS — BP 94/60 | HR 69 | Ht 68.0 in | Wt 141.4 lb

## 2010-08-13 DIAGNOSIS — F17200 Nicotine dependence, unspecified, uncomplicated: Secondary | ICD-10-CM

## 2010-08-13 DIAGNOSIS — I2510 Atherosclerotic heart disease of native coronary artery without angina pectoris: Secondary | ICD-10-CM

## 2010-08-13 DIAGNOSIS — I10 Essential (primary) hypertension: Secondary | ICD-10-CM

## 2010-08-13 NOTE — Assessment & Plan Note (Signed)
Blood pressure well-controlled today. 

## 2010-08-13 NOTE — Progress Notes (Signed)
Clinical Summary Kyle Lowery is a 60 y.o.male presenting for post hospital followup. He was seen in consultation at [facility] in June with atypical chest pain in the setting of known CAD. He ruled out for myocardial infarction and was referred for cardiac testing.  Lexiscan and Myoview from June 7 showed probable soft tissue attenuation LVEF 64%, no active ischemia. Carotid Dopplers demonstrated moderate plaque within the right internal carotid artery of 50-69% an approximately 50% on the left. I reviewed this with the patient and his wife today.  Today I again reminded him about the importance of smoking cessation. He reports compliance with medications.  No Known Allergies  Current outpatient prescriptions:amLODipine (NORVASC) 5 MG tablet, Take 5 mg by mouth daily.  , Disp: , Rfl: ;  aspirin 81 MG tablet, Take 81 mg by mouth daily.  , Disp: , Rfl: ;  esomeprazole (NEXIUM) 40 MG capsule, Take 40 mg by mouth daily before breakfast.  , Disp: , Rfl: ;  lisinopril-hydrochlorothiazide (PRINZIDE,ZESTORETIC) 20-12.5 MG per tablet, Take 1 tablet by mouth daily.  , Disp: , Rfl:  metoprolol succinate (TOPROL-XL) 25 MG 24 hr tablet, Take 25 mg by mouth daily.  , Disp: , Rfl: ;  PARoxetine (PAXIL) 20 MG tablet, Take 20 mg by mouth every morning.  , Disp: , Rfl: ;  potassium chloride (KLOR-CON) 10 MEQ CR tablet, Take 10 mEq by mouth daily.  , Disp: , Rfl: ;  pravastatin (PRAVACHOL) 20 MG tablet, Take 1 tablet (20 mg total) by mouth every evening., Disp: 30 tablet, Rfl: 6 nitroGLYCERIN (NITROSTAT) 0.4 MG SL tablet, Place 0.4 mg under the tongue every 5 (five) minutes as needed.  , Disp: , Rfl: ;  DISCONTD: promethazine (PHENERGAN) 25 MG tablet, Take 25 mg by mouth every 6 (six) hours as needed.  , Disp: , Rfl:   Past Medical History  Diagnosis Date  . Coronary atherosclerosis of native coronary artery     Mild to moderate NOCAD with 80% ostial diagonal 1/12  . Essential hypertension, benign   . Depression    Prior suicide attempt  . GERD (gastroesophageal reflux disease)     Esophageal dilatation  . Syncope     Neurocardiogenic  . COPD (chronic obstructive pulmonary disease)   . Mixed hyperlipidemia     Social History Kyle Lowery reports that he has been smoking Cigarettes.  He has a 19 pack-year smoking history. He has never used smokeless tobacco. Kyle Lowery reports that he does not drink alcohol.  Review of Systems No reported bleeding problems. Stable appetite. Otherwise negative.  Physical Examination Filed Vitals:   08/13/10 1506  BP: 94/60  Pulse: 69  Chronically ill-appearing male in no acute distress.  HEENT: Conjunctiva and lids normal, oropharynx with poor dentition.  Neck: Supple, no elevated JVP, no carotid bruits or thyromegaly.  Lungs: Diminished breath sounds, and no wheezing, nonlabored breathing.  Cardiac: Regular rate and rhythm, no S3 gallop or pericardial rub. S4.  Abdomen: Soft, nontender, bowel sounds present.  Skin: Warm and dry.  Extremities: No pitting edema, distal pulses one plus.  Musculoskeletal: No kyphosis.  Neuropsychiatric: Alert and oriented x3, affect appropriate.      Problem List and Plan

## 2010-08-13 NOTE — Assessment & Plan Note (Signed)
Smoking cessation was again discussed 

## 2010-08-13 NOTE — Assessment & Plan Note (Signed)
Symptomatically stable on medical therapy. Recent followup Cardiolite was low risk. Continue observation.

## 2010-08-13 NOTE — Patient Instructions (Signed)
Continue all current medications.  Labs - fasting lipid panel & liver function just before next visit.  A reminder will be mailed to you when time to do this.   Your physician wants you to follow up in: 6 months.  You will receive a reminder letter in the mail one-two months in advance.  If you don't receive a letter, please call our office to schedule the follow up appointment

## 2010-11-02 ENCOUNTER — Other Ambulatory Visit: Payer: Self-pay | Admitting: Cardiology

## 2010-11-18 ENCOUNTER — Other Ambulatory Visit: Payer: Self-pay | Admitting: Cardiology

## 2011-02-28 ENCOUNTER — Other Ambulatory Visit: Payer: Self-pay | Admitting: *Deleted

## 2011-02-28 DIAGNOSIS — E785 Hyperlipidemia, unspecified: Secondary | ICD-10-CM

## 2011-02-28 DIAGNOSIS — Z79899 Other long term (current) drug therapy: Secondary | ICD-10-CM

## 2011-03-10 ENCOUNTER — Ambulatory Visit: Payer: BC Managed Care – PPO | Admitting: Cardiology

## 2011-03-11 ENCOUNTER — Encounter: Payer: Medicare Other | Admitting: *Deleted

## 2011-03-11 ENCOUNTER — Ambulatory Visit (INDEPENDENT_AMBULATORY_CARE_PROVIDER_SITE_OTHER): Payer: Medicare Other | Admitting: Cardiology

## 2011-03-11 ENCOUNTER — Encounter: Payer: Self-pay | Admitting: Cardiology

## 2011-03-11 VITALS — BP 201/114 | HR 73 | Ht 68.0 in | Wt 137.0 lb

## 2011-03-11 DIAGNOSIS — I2510 Atherosclerotic heart disease of native coronary artery without angina pectoris: Secondary | ICD-10-CM

## 2011-03-11 DIAGNOSIS — R519 Headache, unspecified: Secondary | ICD-10-CM | POA: Insufficient documentation

## 2011-03-11 DIAGNOSIS — R51 Headache: Secondary | ICD-10-CM | POA: Insufficient documentation

## 2011-03-11 DIAGNOSIS — E785 Hyperlipidemia, unspecified: Secondary | ICD-10-CM

## 2011-03-11 DIAGNOSIS — I10 Essential (primary) hypertension: Secondary | ICD-10-CM

## 2011-03-11 DIAGNOSIS — R55 Syncope and collapse: Secondary | ICD-10-CM | POA: Insufficient documentation

## 2011-03-11 DIAGNOSIS — F17200 Nicotine dependence, unspecified, uncomplicated: Secondary | ICD-10-CM

## 2011-03-11 MED ORDER — LISINOPRIL 10 MG PO TABS: 10.0000 mg | ORAL_TABLET | Freq: Every day | ORAL | Status: DC

## 2011-03-11 MED ORDER — PRAVASTATIN SODIUM 20 MG PO TABS: 20.0000 mg | ORAL_TABLET | Freq: Every evening | ORAL | Status: DC

## 2011-03-11 MED ORDER — AMLODIPINE BESYLATE 5 MG PO TABS: 5.0000 mg | ORAL_TABLET | Freq: Every day | ORAL | Status: DC

## 2011-03-11 NOTE — Assessment & Plan Note (Signed)
No recurrence. 

## 2011-03-11 NOTE — Assessment & Plan Note (Signed)
Patient reports chronic problems with headaches. States that he has seen a neurologist in the past, although not regularly. Will make a referral to Dr. Gerilyn Pilgrim going forward.

## 2011-03-11 NOTE — Assessment & Plan Note (Signed)
We have discussed smoking cessation again.

## 2011-03-11 NOTE — Assessment & Plan Note (Signed)
Refill provided for Pravachol.

## 2011-03-11 NOTE — Assessment & Plan Note (Signed)
Blood pressure is significantly elevated. I suspect he is going to require more baseline antihypertensive therapy. Plan to reinstitute Norvasc at 5 mg daily which he had previously tolerated well. Will also start lisinopril at 10 mg daily, which is half the dose of his previous therapy, and not use concurrent HCTZ. Nurse blood pressure check in 2 weeks.

## 2011-03-11 NOTE — Assessment & Plan Note (Signed)
Clinically stable without active angina. ECG is normal. Continue observation and medical therapy.

## 2011-03-11 NOTE — Patient Instructions (Signed)
   Refills sent to pharmacy for Pravachol, Norvasc, Lisinopril  Nurse visit in 2 weeks for blood pressure check - see above  Referral to Dr. Gerilyn Pilgrim in [place] for chronic headache Follow up in  2 months - see above

## 2011-03-11 NOTE — Progress Notes (Signed)
   Clinical Summary Mr. Bassford is a 61 y.o.male presenting for followup. He was seen in July 2012. Record review finds admission to [facility] back in November following an episode of syncope that occurred after defecating. He has a known history of vasovagal syncope. He was noted to have acute renal insufficiency, most of his blood pressure medications were discontinued, and he was hydrated. Cardiac markers were normal at that time. We did not see him in consultation.  He reports no chest pain symptoms. Has had no further syncope. Blood pressure is significantly elevated today.  Recent lab work showed AST 14, ALT 9, triglycerides 97, cholesterol 157, HDL 37, LDL 101.  He has run out of his Pravachol.  He also reports problems with chronic headaches.   No Known Allergies  Current Outpatient Prescriptions  Medication Sig Dispense Refill  . aspirin 81 MG tablet Take 81 mg by mouth daily.        Marland Kitchen esomeprazole (NEXIUM) 40 MG capsule Take 40 mg by mouth daily before breakfast.        . metoprolol succinate (TOPROL-XL) 25 MG 24 hr tablet       . mirtazapine (REMERON) 30 MG tablet Take 30 mg by mouth at bedtime.      . nitroGLYCERIN (NITROSTAT) 0.4 MG SL tablet Place 0.4 mg under the tongue every 5 (five) minutes as needed.       . pravastatin (PRAVACHOL) 20 MG tablet Take 1 tablet (20 mg total) by mouth every evening.  30 tablet  6  . amLODipine (NORVASC) 5 MG tablet Take 1 tablet (5 mg total) by mouth daily.  30 tablet  6  . lisinopril (PRINIVIL,ZESTRIL) 10 MG tablet Take 1 tablet (10 mg total) by mouth daily.  30 tablet  6    Past Medical History  Diagnosis Date  . Coronary atherosclerosis of native coronary artery     Mild to moderate NOCAD with 80% ostial diagonal 1/12  . Essential hypertension, benign   . Depression     Prior suicide attempt  . GERD (gastroesophageal reflux disease)     Esophageal dilatation  . Syncope     Neurocardiogenic  . COPD (chronic obstructive pulmonary  disease)   . Mixed hyperlipidemia     Social History Mr. Veltre reports that he has been smoking Cigarettes.  He has a 38 pack-year smoking history. He has never used smokeless tobacco. Mr. Hinch reports that he does not drink alcohol.  Review of Systems No palpitations. No reported bleeding problems. Stable appetite. Still smoking cigarettes, has not been able to quit. Otherwise reviewed and negative.  Physical Examination Filed Vitals:   03/11/11 1339  BP: 201/114  Pulse: 73   Chronically ill-appearing male in no acute distress.  HEENT: Conjunctiva and lids normal, oropharynx with poor dentition.  Neck: Supple, no elevated JVP, no carotid bruits or thyromegaly.  Lungs: Diminished breath sounds, and no wheezing, nonlabored breathing.  Cardiac: Regular rate and rhythm, no S3 gallop or pericardial rub. S4.  Abdomen: Soft, nontender, bowel sounds present.  Skin: Warm and dry.  Extremities: No pitting edema, distal pulses one plus.  Musculoskeletal: No kyphosis.  Neuropsychiatric: Alert and oriented x3, affect appropriate. No nystagmus, moves all extremities equally.  ECG Normal sinus rhythm at 78.   Problem List and Plan

## 2011-03-16 ENCOUNTER — Telehealth: Payer: Self-pay | Admitting: *Deleted

## 2011-03-16 NOTE — Telephone Encounter (Signed)
Pt referred to Dr. Gerilyn Pilgrim regarding chronic headaches. Irving Burton from Dr. Ronal Fear office left message on voicemail stating pt is scheduled for 03/29/2011 at 4pm. Spoke with pt who states he was notified by Dr. Ronal Fear office.

## 2011-03-25 ENCOUNTER — Ambulatory Visit (INDEPENDENT_AMBULATORY_CARE_PROVIDER_SITE_OTHER): Payer: Medicare Other | Admitting: *Deleted

## 2011-03-25 VITALS — BP 133/89 | HR 78

## 2011-03-25 DIAGNOSIS — I10 Essential (primary) hypertension: Secondary | ICD-10-CM | POA: Diagnosis not present

## 2011-03-25 NOTE — Progress Notes (Signed)
Pt presents for BP check this am as requested at recent office visit. Pt states he did not have time to take his medications this morning.

## 2011-05-09 ENCOUNTER — Ambulatory Visit (INDEPENDENT_AMBULATORY_CARE_PROVIDER_SITE_OTHER): Payer: Medicare Other | Admitting: Cardiology

## 2011-05-09 ENCOUNTER — Encounter: Payer: Self-pay | Admitting: Cardiology

## 2011-05-09 VITALS — BP 133/81 | HR 64 | Ht 68.0 in | Wt 138.8 lb

## 2011-05-09 DIAGNOSIS — I2510 Atherosclerotic heart disease of native coronary artery without angina pectoris: Secondary | ICD-10-CM

## 2011-05-09 DIAGNOSIS — E785 Hyperlipidemia, unspecified: Secondary | ICD-10-CM | POA: Diagnosis not present

## 2011-05-09 DIAGNOSIS — I10 Essential (primary) hypertension: Secondary | ICD-10-CM

## 2011-05-09 DIAGNOSIS — F17200 Nicotine dependence, unspecified, uncomplicated: Secondary | ICD-10-CM

## 2011-05-09 NOTE — Assessment & Plan Note (Signed)
We continue to address smoking cessation strategies.

## 2011-05-09 NOTE — Assessment & Plan Note (Signed)
Followup FLP and LFT for next visit. Continue Pravachol.

## 2011-05-09 NOTE — Progress Notes (Signed)
   Clinical Summary Kyle Lowery is a 61 y.o.male presenting for followup. He was seen in February of this year. He reports no problems with syncope or chest pain in the interim. States that he has been compliant with his medications.  Patient had nursing blood pressure check following last visit, 133/89. Blood pressure trend continues to be better following medication adjustment at the last visit.  He ultimately decided to cancel his appointment with Dr. Gerilyn Lowery with his history of chronic headaches.  Also continues to struggle with smoking cessation. We discussed this again today.   No Known Allergies  Current Outpatient Prescriptions  Medication Sig Dispense Refill  . amLODipine (NORVASC) 5 MG tablet Take 1 tablet (5 mg total) by mouth daily.  30 tablet  6  . aspirin 81 MG tablet Take 81 mg by mouth daily.        Marland Kitchen esomeprazole (NEXIUM) 40 MG capsule Take 40 mg by mouth daily before breakfast.        . lisinopril (PRINIVIL,ZESTRIL) 10 MG tablet Take 1 tablet (10 mg total) by mouth daily.  30 tablet  6  . metoprolol succinate (TOPROL-XL) 25 MG 24 hr tablet Take 25 mg by mouth daily.       . mirtazapine (REMERON) 30 MG tablet Take 15 mg by mouth at bedtime as needed.       . pravastatin (PRAVACHOL) 20 MG tablet Take 1 tablet (20 mg total) by mouth every evening.  30 tablet  6  . nitroGLYCERIN (NITROSTAT) 0.4 MG SL tablet Place 0.4 mg under the tongue every 5 (five) minutes as needed.         Past Medical History  Diagnosis Date  . Coronary atherosclerosis of native coronary artery     Mild to moderate NOCAD with 80% ostial diagonal 1/12  . Essential hypertension, benign   . Depression     Prior suicide attempt  . GERD (gastroesophageal reflux disease)     Esophageal dilatation  . Syncope     Neurocardiogenic  . COPD (chronic obstructive pulmonary disease)   . Mixed hyperlipidemia     Social History Kyle Lowery reports that he has been smoking Cigarettes.  He has a 38  pack-year smoking history. He has never used smokeless tobacco. Kyle Lowery reports that he does not drink alcohol.  Review of Systems No progressive headaches, syncope, palpitations. Otherwise negative.  Physical Examination Filed Vitals:   05/09/11 0839  BP: 133/81  Pulse: 64    Chronically ill-appearing male in no acute distress.  HEENT: Conjunctiva and lids normal, oropharynx with poor dentition.  Neck: Supple, no elevated JVP, no carotid bruits or thyromegaly.  Lungs: Diminished breath sounds, and no wheezing, nonlabored breathing.  Cardiac: Regular rate and rhythm, no S3 gallop or pericardial rub. S4.  Abdomen: Soft, nontender, bowel sounds present.  Skin: Warm and dry.  Extremities: No pitting edema, distal pulses one plus.  Musculoskeletal: No kyphosis.  Neuropsychiatric: Alert and oriented x3, affect appropriate.   Problem List and Plan

## 2011-05-09 NOTE — Patient Instructions (Signed)
Continue all current medications. Your physician wants you to follow up in: 6 months.  You will receive a reminder letter in the mail one-two months in advance.  If you don't receive a letter, please call our office to schedule the follow up appointment   

## 2011-05-09 NOTE — Assessment & Plan Note (Signed)
Blood pressure trend is significantly better following recent medication adjustments. Continue same for now.

## 2011-05-09 NOTE — Assessment & Plan Note (Signed)
Symptomatically stable on medical therapy. Continue observation. Followup arranged. 

## 2011-06-30 ENCOUNTER — Other Ambulatory Visit: Payer: Self-pay | Admitting: Cardiology

## 2011-07-21 DIAGNOSIS — I10 Essential (primary) hypertension: Secondary | ICD-10-CM | POA: Diagnosis not present

## 2011-07-21 DIAGNOSIS — E7800 Pure hypercholesterolemia, unspecified: Secondary | ICD-10-CM | POA: Diagnosis not present

## 2011-07-21 DIAGNOSIS — I2510 Atherosclerotic heart disease of native coronary artery without angina pectoris: Secondary | ICD-10-CM | POA: Diagnosis not present

## 2011-10-20 DIAGNOSIS — E7800 Pure hypercholesterolemia, unspecified: Secondary | ICD-10-CM | POA: Diagnosis not present

## 2011-10-20 DIAGNOSIS — I10 Essential (primary) hypertension: Secondary | ICD-10-CM | POA: Diagnosis not present

## 2011-10-20 DIAGNOSIS — I2510 Atherosclerotic heart disease of native coronary artery without angina pectoris: Secondary | ICD-10-CM | POA: Diagnosis not present

## 2011-10-24 ENCOUNTER — Other Ambulatory Visit: Payer: Self-pay | Admitting: Cardiology

## 2011-11-07 ENCOUNTER — Other Ambulatory Visit: Payer: Self-pay | Admitting: Cardiology

## 2011-12-01 DIAGNOSIS — Z23 Encounter for immunization: Secondary | ICD-10-CM | POA: Diagnosis not present

## 2012-01-11 DIAGNOSIS — I2510 Atherosclerotic heart disease of native coronary artery without angina pectoris: Secondary | ICD-10-CM | POA: Diagnosis not present

## 2012-01-11 DIAGNOSIS — E7800 Pure hypercholesterolemia, unspecified: Secondary | ICD-10-CM | POA: Diagnosis not present

## 2012-01-11 DIAGNOSIS — I10 Essential (primary) hypertension: Secondary | ICD-10-CM | POA: Diagnosis not present

## 2012-01-19 DIAGNOSIS — I2510 Atherosclerotic heart disease of native coronary artery without angina pectoris: Secondary | ICD-10-CM | POA: Diagnosis not present

## 2012-01-19 DIAGNOSIS — I10 Essential (primary) hypertension: Secondary | ICD-10-CM | POA: Diagnosis not present

## 2012-01-23 ENCOUNTER — Ambulatory Visit (INDEPENDENT_AMBULATORY_CARE_PROVIDER_SITE_OTHER): Payer: Medicare Other | Admitting: Cardiology

## 2012-01-23 ENCOUNTER — Encounter: Payer: Self-pay | Admitting: Cardiology

## 2012-01-23 VITALS — BP 135/75 | HR 85 | Ht 68.0 in | Wt 131.0 lb

## 2012-01-23 DIAGNOSIS — I2510 Atherosclerotic heart disease of native coronary artery without angina pectoris: Secondary | ICD-10-CM

## 2012-01-23 DIAGNOSIS — I10 Essential (primary) hypertension: Secondary | ICD-10-CM

## 2012-01-23 DIAGNOSIS — F17200 Nicotine dependence, unspecified, uncomplicated: Secondary | ICD-10-CM | POA: Diagnosis not present

## 2012-01-23 DIAGNOSIS — E785 Hyperlipidemia, unspecified: Secondary | ICD-10-CM | POA: Diagnosis not present

## 2012-01-23 NOTE — Patient Instructions (Addendum)

## 2012-01-23 NOTE — Assessment & Plan Note (Signed)
No change to current antihypertensive regimen. Keep followup with Dr. Felecia Shelling.

## 2012-01-23 NOTE — Assessment & Plan Note (Signed)
Recent LDL 98. Continue current regimen.

## 2012-01-23 NOTE — Assessment & Plan Note (Signed)
Largely nonobstructive disease as outlined above. No progressive angina symptoms. Continue medical therapy and observation for now, six-month visit arranged.

## 2012-01-23 NOTE — Assessment & Plan Note (Signed)
We continue to address smoking cessation. He has not been able to quit so far.

## 2012-01-23 NOTE — Progress Notes (Signed)
   Clinical Summary Mr. Housholder is a 61 y.o.male presenting for followup. He was seen in April. He reports no progressive angina symptoms, stable dyspnea on exertion. Medications reviewed, he reports compliance.  Recent lab work reviewed from 12/4 finding BUN 11, creatinine 0.8, AST 17, ALT 13, cholesterol 156, triglycerides 81, HDL 42, LDL 98, potassium 4.1.  We completed a form regarding his cardiovascular status for the state in terms of his license to drive. Other information completed by primary care provider.  He has not been able to quit smoking. We continue to discuss this. Still remains under a lot of stress.   No Known Allergies  Current Outpatient Prescriptions  Medication Sig Dispense Refill  . amLODipine (NORVASC) 2.5 MG tablet Take 2.5 mg by mouth daily.      Marland Kitchen aspirin 81 MG tablet Take 81 mg by mouth daily.        Marland Kitchen esomeprazole (NEXIUM) 40 MG capsule Take 40 mg by mouth daily before breakfast.        . lisinopril-hydrochlorothiazide (PRINZIDE,ZESTORETIC) 20-12.5 MG per tablet Take 1 tablet by mouth daily.      . metoprolol succinate (TOPROL-XL) 25 MG 24 hr tablet TAKE ONE TABLET BY MOUTH EVERY DAY  30 tablet  6  . nitroGLYCERIN (NITROSTAT) 0.4 MG SL tablet Place 0.4 mg under the tongue every 5 (five) minutes as needed.       . pravastatin (PRAVACHOL) 20 MG tablet TAKE ONE TABLET BY MOUTH IN THE EVENING  30 tablet  5    Past Medical History  Diagnosis Date  . Coronary atherosclerosis of native coronary artery     Mild to moderate NOCAD with 80% ostial diagonal 1/12  . Essential hypertension, benign   . Depression     Prior suicide attempt  . GERD (gastroesophageal reflux disease)     Esophageal dilatation  . Syncope     Neurocardiogenic  . COPD (chronic obstructive pulmonary disease)   . Mixed hyperlipidemia     Social History Mr. Earnest reports that he has been smoking Cigarettes.  He has a 38 pack-year smoking history. He has never used smokeless  tobacco. Mr. Stecklein reports that he does not drink alcohol.  Review of Systems No palpitations, no syncope. No hospitalizations. Otherwise negative.  Physical Examination Filed Vitals:   01/23/12 0915  BP: 135/75  Pulse: 85   Filed Weights   01/23/12 0915  Weight: 131 lb (59.421 kg)    Chronically ill-appearing male in no acute distress.  HEENT: Conjunctiva and lids normal, oropharynx with poor dentition.  Neck: Supple, no elevated JVP, no carotid bruits or thyromegaly.  Lungs: Diminished breath sounds, and no wheezing, nonlabored breathing.  Cardiac: Regular rate and rhythm, no S3 gallop or pericardial rub. S4.  Abdomen: Soft, nontender, bowel sounds present.  Skin: Warm and dry.  Extremities: No pitting edema, distal pulses one plus.    Problem List and Plan   CORONARY ATHEROSCLEROSIS NATIVE CORONARY ARTERY Largely nonobstructive disease as outlined above. No progressive angina symptoms. Continue medical therapy and observation for now, six-month visit arranged.  Essential hypertension, benign No change to current antihypertensive regimen. Keep followup with Dr. Felecia Shelling.  HYPERLIPIDEMIA Recent LDL 98. Continue current regimen.  TOBACCO ABUSE We continue to address smoking cessation. He has not been able to quit so far.    Jonelle Sidle, M.D., F.A.C.C.

## 2012-01-24 ENCOUNTER — Ambulatory Visit: Payer: Medicare Other | Admitting: Cardiology

## 2012-04-26 DIAGNOSIS — E7800 Pure hypercholesterolemia, unspecified: Secondary | ICD-10-CM | POA: Diagnosis not present

## 2012-04-26 DIAGNOSIS — I2510 Atherosclerotic heart disease of native coronary artery without angina pectoris: Secondary | ICD-10-CM | POA: Diagnosis not present

## 2012-04-26 DIAGNOSIS — I10 Essential (primary) hypertension: Secondary | ICD-10-CM | POA: Diagnosis not present

## 2012-06-07 DIAGNOSIS — R109 Unspecified abdominal pain: Secondary | ICD-10-CM | POA: Diagnosis not present

## 2012-06-07 DIAGNOSIS — Z7982 Long term (current) use of aspirin: Secondary | ICD-10-CM | POA: Diagnosis not present

## 2012-06-07 DIAGNOSIS — E861 Hypovolemia: Secondary | ICD-10-CM | POA: Diagnosis not present

## 2012-06-07 DIAGNOSIS — I2510 Atherosclerotic heart disease of native coronary artery without angina pectoris: Secondary | ICD-10-CM | POA: Diagnosis not present

## 2012-06-07 DIAGNOSIS — Z538 Procedure and treatment not carried out for other reasons: Secondary | ICD-10-CM | POA: Diagnosis not present

## 2012-06-07 DIAGNOSIS — R630 Anorexia: Secondary | ICD-10-CM | POA: Diagnosis not present

## 2012-06-07 DIAGNOSIS — R1310 Dysphagia, unspecified: Secondary | ICD-10-CM | POA: Diagnosis not present

## 2012-06-07 DIAGNOSIS — Z79899 Other long term (current) drug therapy: Secondary | ICD-10-CM | POA: Diagnosis not present

## 2012-06-07 DIAGNOSIS — F17200 Nicotine dependence, unspecified, uncomplicated: Secondary | ICD-10-CM | POA: Diagnosis not present

## 2012-06-07 DIAGNOSIS — I959 Hypotension, unspecified: Secondary | ICD-10-CM | POA: Diagnosis not present

## 2012-06-07 DIAGNOSIS — J449 Chronic obstructive pulmonary disease, unspecified: Secondary | ICD-10-CM | POA: Diagnosis not present

## 2012-06-07 DIAGNOSIS — E785 Hyperlipidemia, unspecified: Secondary | ICD-10-CM | POA: Diagnosis not present

## 2012-06-07 DIAGNOSIS — R5381 Other malaise: Secondary | ICD-10-CM | POA: Diagnosis not present

## 2012-06-07 DIAGNOSIS — I10 Essential (primary) hypertension: Secondary | ICD-10-CM | POA: Diagnosis not present

## 2012-06-07 DIAGNOSIS — I9589 Other hypotension: Secondary | ICD-10-CM | POA: Diagnosis not present

## 2012-06-07 DIAGNOSIS — K219 Gastro-esophageal reflux disease without esophagitis: Secondary | ICD-10-CM | POA: Diagnosis not present

## 2012-06-08 DIAGNOSIS — R5381 Other malaise: Secondary | ICD-10-CM | POA: Diagnosis not present

## 2012-06-08 DIAGNOSIS — R1013 Epigastric pain: Secondary | ICD-10-CM | POA: Diagnosis not present

## 2012-06-08 DIAGNOSIS — I959 Hypotension, unspecified: Secondary | ICD-10-CM | POA: Diagnosis not present

## 2012-06-08 DIAGNOSIS — R1310 Dysphagia, unspecified: Secondary | ICD-10-CM | POA: Diagnosis not present

## 2012-07-04 ENCOUNTER — Other Ambulatory Visit: Payer: Self-pay | Admitting: Cardiology

## 2012-08-03 ENCOUNTER — Ambulatory Visit: Payer: BC Managed Care – PPO | Admitting: Cardiology

## 2012-08-03 ENCOUNTER — Ambulatory Visit (INDEPENDENT_AMBULATORY_CARE_PROVIDER_SITE_OTHER): Payer: Medicare Other | Admitting: Cardiology

## 2012-08-03 ENCOUNTER — Encounter: Payer: Self-pay | Admitting: Cardiology

## 2012-08-03 VITALS — BP 117/75 | HR 70 | Ht 68.0 in | Wt 124.8 lb

## 2012-08-03 DIAGNOSIS — I10 Essential (primary) hypertension: Secondary | ICD-10-CM | POA: Diagnosis not present

## 2012-08-03 DIAGNOSIS — F17200 Nicotine dependence, unspecified, uncomplicated: Secondary | ICD-10-CM

## 2012-08-03 DIAGNOSIS — I2510 Atherosclerotic heart disease of native coronary artery without angina pectoris: Secondary | ICD-10-CM

## 2012-08-03 NOTE — Assessment & Plan Note (Signed)
We continue to discuss smoking cessation. He has not been able to quit.

## 2012-08-03 NOTE — Progress Notes (Signed)
   Clinical Summary Mr. Ohalloran is a 62 y.o.male last seen in December 2013. He reports no regular angina symptoms. We reviewed his medications, he reports compliance.  He still struggles to quit smoking, although has cut back some.  ECG today shows normal sinus rhythm.  No Known Allergies  Current Outpatient Prescriptions  Medication Sig Dispense Refill  . aspirin 81 MG tablet Take 81 mg by mouth daily.        Marland Kitchen esomeprazole (NEXIUM) 40 MG capsule Take 40 mg by mouth daily before breakfast.        . lisinopril-hydrochlorothiazide (PRINZIDE,ZESTORETIC) 20-12.5 MG per tablet Take 1 tablet by mouth daily.      . metoprolol succinate (TOPROL-XL) 25 MG 24 hr tablet TAKE ONE TABLET BY MOUTH EVERY DAY  30 tablet  0  . nitroGLYCERIN (NITROSTAT) 0.4 MG SL tablet Place 0.4 mg under the tongue every 5 (five) minutes as needed.       . pravastatin (PRAVACHOL) 20 MG tablet TAKE ONE TABLET BY MOUTH IN THE EVENING  30 tablet  5   No current facility-administered medications for this visit.    Past Medical History  Diagnosis Date  . Coronary atherosclerosis of native coronary artery     Mild to moderate NOCAD with 80% ostial diagonal 1/12  . Essential hypertension, benign   . Depression     Prior suicide attempt  . GERD (gastroesophageal reflux disease)     Esophageal dilatation  . Syncope     Neurocardiogenic  . COPD (chronic obstructive pulmonary disease)   . Mixed hyperlipidemia     Social History Mr. Teutsch reports that he has been smoking Cigarettes.  He has a 38 pack-year smoking history. He has never used smokeless tobacco. Mr. Yohannes reports that he does not drink alcohol.  Review of Systems No claudication. No palpitations. No reported bleeding episodes. Otherwise negative.  Physical Examination Filed Vitals:   08/03/12 0831  BP: 117/75  Pulse: 70   Filed Weights   08/03/12 0831  Weight: 124 lb 12.8 oz (56.609 kg)    Chronically ill-appearing male, comfortable at  rest.  HEENT: Conjunctiva and lids normal, oropharynx with poor dentition.  Neck: Supple, no elevated JVP, no carotid bruits or thyromegaly.  Lungs: Diminished breath sounds, and no wheezing, nonlabored breathing.  Cardiac: Regular rate and rhythm, no S3 gallop or pericardial rub. S4.  Abdomen: Soft, nontender, bowel sounds present.  Skin: Warm and dry.  Extremities: No pitting edema, distal pulses one plus.    Problem List and Plan   CORONARY ATHEROSCLEROSIS NATIVE CORONARY ARTERY Plan to continue medical therapy and observation.  Essential hypertension, benign Blood pressure is normal today.  TOBACCO ABUSE We continue to discuss smoking cessation. He has not been able to quit.    Jonelle Sidle, M.D., F.A.C.C.

## 2012-08-03 NOTE — Assessment & Plan Note (Addendum)
Blood pressure is normal today. 

## 2012-08-03 NOTE — Patient Instructions (Addendum)

## 2012-08-03 NOTE — Assessment & Plan Note (Signed)
Plan to continue medical therapy and observation.

## 2012-08-13 ENCOUNTER — Other Ambulatory Visit: Payer: Self-pay | Admitting: Cardiology

## 2012-08-13 MED ORDER — METOPROLOL SUCCINATE ER 25 MG PO TB24: 25.0000 mg | ORAL_TABLET | Freq: Every day | ORAL | Status: DC

## 2012-08-16 DIAGNOSIS — I10 Essential (primary) hypertension: Secondary | ICD-10-CM | POA: Diagnosis not present

## 2012-08-16 DIAGNOSIS — K219 Gastro-esophageal reflux disease without esophagitis: Secondary | ICD-10-CM | POA: Diagnosis not present

## 2012-08-16 DIAGNOSIS — E7800 Pure hypercholesterolemia, unspecified: Secondary | ICD-10-CM | POA: Diagnosis not present

## 2012-11-02 DIAGNOSIS — Z23 Encounter for immunization: Secondary | ICD-10-CM | POA: Diagnosis not present

## 2013-02-26 DIAGNOSIS — E7800 Pure hypercholesterolemia, unspecified: Secondary | ICD-10-CM | POA: Diagnosis not present

## 2013-02-26 DIAGNOSIS — I10 Essential (primary) hypertension: Secondary | ICD-10-CM | POA: Diagnosis not present

## 2013-04-18 DIAGNOSIS — J449 Chronic obstructive pulmonary disease, unspecified: Secondary | ICD-10-CM | POA: Diagnosis not present

## 2013-04-18 DIAGNOSIS — J189 Pneumonia, unspecified organism: Secondary | ICD-10-CM | POA: Diagnosis not present

## 2013-04-19 ENCOUNTER — Inpatient Hospital Stay (HOSPITAL_COMMUNITY): Payer: BC Managed Care – PPO

## 2013-04-19 ENCOUNTER — Encounter (HOSPITAL_COMMUNITY): Payer: Self-pay

## 2013-04-19 ENCOUNTER — Inpatient Hospital Stay (HOSPITAL_COMMUNITY)
Admission: AD | Admit: 2013-04-19 | Discharge: 2013-04-23 | DRG: 195 | Disposition: A | Discharge status: Home / Self Care | Payer: BC Managed Care – PPO | Source: Ambulatory Visit | Attending: Internal Medicine | Admitting: Internal Medicine

## 2013-04-19 DIAGNOSIS — I10 Essential (primary) hypertension: Secondary | ICD-10-CM | POA: Diagnosis not present

## 2013-04-19 DIAGNOSIS — J449 Chronic obstructive pulmonary disease, unspecified: Secondary | ICD-10-CM | POA: Diagnosis present

## 2013-04-19 DIAGNOSIS — E876 Hypokalemia: Secondary | ICD-10-CM | POA: Diagnosis present

## 2013-04-19 DIAGNOSIS — J4489 Other specified chronic obstructive pulmonary disease: Secondary | ICD-10-CM | POA: Diagnosis not present

## 2013-04-19 DIAGNOSIS — F17200 Nicotine dependence, unspecified, uncomplicated: Secondary | ICD-10-CM | POA: Diagnosis not present

## 2013-04-19 DIAGNOSIS — K219 Gastro-esophageal reflux disease without esophagitis: Secondary | ICD-10-CM | POA: Diagnosis present

## 2013-04-19 DIAGNOSIS — Z7982 Long term (current) use of aspirin: Secondary | ICD-10-CM

## 2013-04-19 DIAGNOSIS — J189 Pneumonia, unspecified organism: Principal | ICD-10-CM | POA: Diagnosis present

## 2013-04-19 DIAGNOSIS — E782 Mixed hyperlipidemia: Secondary | ICD-10-CM | POA: Diagnosis not present

## 2013-04-19 DIAGNOSIS — Z8249 Family history of ischemic heart disease and other diseases of the circulatory system: Secondary | ICD-10-CM | POA: Diagnosis not present

## 2013-04-19 DIAGNOSIS — I2510 Atherosclerotic heart disease of native coronary artery without angina pectoris: Secondary | ICD-10-CM | POA: Diagnosis not present

## 2013-04-19 LAB — BASIC METABOLIC PANEL
BUN: 17 mg/dL (ref 6–23)
CALCIUM: 8.8 mg/dL (ref 8.4–10.5)
CO2: 31 mEq/L (ref 19–32)
CREATININE: 0.91 mg/dL (ref 0.50–1.35)
Chloride: 101 mEq/L (ref 96–112)
GFR calc Af Amer: >90 mL/min (ref >90)
GFR calc non Af Amer: 90 mL/min — ABNORMAL LOW (ref >90)
GLUCOSE: 113 mg/dL — AB (ref 70–99)
Potassium: 2.9 mEq/L — CL (ref 3.7–5.3)
Sodium: 143 mEq/L (ref 137–147)

## 2013-04-19 LAB — CBC WITH DIFFERENTIAL/PLATELET
BASOS ABS: 0 10*3/uL (ref 0.0–0.1)
Basophils Relative: 0 % (ref 0–1)
EOS PCT: 0 % (ref 0–5)
Eosinophils Absolute: 0 10*3/uL (ref 0.0–0.7)
HCT: 38.1 % — ABNORMAL LOW (ref 39.0–52.0)
Hemoglobin: 13.2 g/dL (ref 13.0–17.0)
Lymphocytes Relative: 27 % (ref 12–46)
Lymphs Abs: 2.9 10*3/uL (ref 0.7–4.0)
MCH: 33.9 pg (ref 26.0–34.0)
MCHC: 34.6 g/dL (ref 30.0–36.0)
MCV: 97.9 fL (ref 78.0–100.0)
MONO ABS: 0.8 10*3/uL (ref 0.1–1.0)
Monocytes Relative: 8 % (ref 3–12)
Neutro Abs: 7 10*3/uL (ref 1.7–7.7)
Neutrophils Relative %: 65 % (ref 43–77)
PLATELETS: 403 10*3/uL — AB (ref 150–400)
RBC: 3.89 MIL/uL — ABNORMAL LOW (ref 4.22–5.81)
RDW: 13.6 % (ref 11.5–15.5)
WBC: 10.8 10*3/uL — ABNORMAL HIGH (ref 4.0–10.5)

## 2013-04-19 MED ORDER — ASPIRIN 81 MG PO CHEW
81.0000 mg | CHEWABLE_TABLET | Freq: Every day | ORAL | Status: DC
  Administered 2013-04-20 – 2013-04-23 (×4): 81 mg via ORAL
  Filled 2013-04-19 (×5): qty 1

## 2013-04-19 MED ORDER — DEXTROSE 5 % IV SOLN
500.0000 mg | INTRAVENOUS | Status: DC
  Administered 2013-04-19 – 2013-04-20 (×2): 500 mg via INTRAVENOUS
  Filled 2013-04-19 (×3): qty 500

## 2013-04-19 MED ORDER — POLYVINYL ALCOHOL 1.4 % OP SOLN
1.0000 [drp] | OPHTHALMIC | Status: DC | PRN
  Administered 2013-04-21: 1 [drp] via OPHTHALMIC
  Filled 2013-04-19: qty 15

## 2013-04-19 MED ORDER — DEXTROSE 5 % IV SOLN
1.0000 g | INTRAVENOUS | Status: DC
  Administered 2013-04-19 – 2013-04-22 (×4): 1 g via INTRAVENOUS
  Filled 2013-04-19 (×4): qty 10

## 2013-04-19 MED ORDER — HYDROCHLOROTHIAZIDE 12.5 MG PO CAPS
12.5000 mg | ORAL_CAPSULE | Freq: Every day | ORAL | Status: DC
  Administered 2013-04-20 – 2013-04-23 (×4): 12.5 mg via ORAL
  Filled 2013-04-19 (×4): qty 1

## 2013-04-19 MED ORDER — PREDNISONE 20 MG PO TABS
30.0000 mg | ORAL_TABLET | Freq: Every day | ORAL | Status: DC
  Administered 2013-04-21 – 2013-04-22 (×2): 30 mg via ORAL
  Filled 2013-04-19 (×4): qty 1

## 2013-04-19 MED ORDER — PREDNISONE 20 MG PO TABS: 20.0000 mg | ORAL_TABLET | Freq: Every day | ORAL | Status: DC

## 2013-04-19 MED ORDER — IPRATROPIUM-ALBUTEROL 0.5-2.5 (3) MG/3ML IN SOLN
3.0000 mL | RESPIRATORY_TRACT | Status: DC
  Administered 2013-04-19: 3 mL via RESPIRATORY_TRACT
  Filled 2013-04-19: qty 3

## 2013-04-19 MED ORDER — PANTOPRAZOLE SODIUM 40 MG PO TBEC
80.0000 mg | DELAYED_RELEASE_TABLET | Freq: Every day | ORAL | Status: DC
  Administered 2013-04-20 – 2013-04-23 (×4): 80 mg via ORAL
  Filled 2013-04-19 (×4): qty 2

## 2013-04-19 MED ORDER — IPRATROPIUM-ALBUTEROL 0.5-2.5 (3) MG/3ML IN SOLN: 3.0000 mL | RESPIRATORY_TRACT | Status: DC | PRN

## 2013-04-19 MED ORDER — NITROGLYCERIN 0.4 MG SL SUBL: 0.4000 mg | SUBLINGUAL_TABLET | SUBLINGUAL | Status: DC | PRN

## 2013-04-19 MED ORDER — POTASSIUM CHLORIDE 10 MEQ/100ML IV SOLN
10.0000 meq | INTRAVENOUS | Status: AC
  Administered 2013-04-19 – 2013-04-20 (×4): 10 meq via INTRAVENOUS
  Filled 2013-04-19 (×3): qty 100

## 2013-04-19 MED ORDER — LISINOPRIL 10 MG PO TABS
20.0000 mg | ORAL_TABLET | Freq: Every day | ORAL | Status: DC
  Administered 2013-04-20 – 2013-04-23 (×4): 20 mg via ORAL
  Filled 2013-04-19 (×4): qty 2

## 2013-04-19 MED ORDER — PREDNISONE 20 MG PO TABS
40.0000 mg | ORAL_TABLET | Freq: Every day | ORAL | Status: AC
  Administered 2013-04-19 – 2013-04-20 (×2): 40 mg via ORAL
  Filled 2013-04-19: qty 2
  Filled 2013-04-19: qty 4

## 2013-04-19 MED ORDER — ENOXAPARIN SODIUM 40 MG/0.4ML ~~LOC~~ SOLN
40.0000 mg | Freq: Every day | SUBCUTANEOUS | Status: DC
  Administered 2013-04-19 – 2013-04-22 (×4): 40 mg via SUBCUTANEOUS
  Filled 2013-04-19 (×4): qty 0.4

## 2013-04-19 MED ORDER — IPRATROPIUM-ALBUTEROL 0.5-2.5 (3) MG/3ML IN SOLN
3.0000 mL | Freq: Three times a day (TID) | RESPIRATORY_TRACT | Status: DC
  Administered 2013-04-19 – 2013-04-23 (×11): 3 mL via RESPIRATORY_TRACT
  Filled 2013-04-19 (×11): qty 3

## 2013-04-19 MED ORDER — SIMVASTATIN 10 MG PO TABS
10.0000 mg | ORAL_TABLET | Freq: Every day | ORAL | Status: DC
Start: 2013-04-20 — End: 2013-04-23
  Administered 2013-04-20 – 2013-04-22 (×3): 10 mg via ORAL
  Filled 2013-04-19 (×3): qty 1

## 2013-04-19 MED ORDER — NICOTINE 21 MG/24HR TD PT24
21.0000 mg | MEDICATED_PATCH | Freq: Every day | TRANSDERMAL | Status: DC
  Administered 2013-04-19 – 2013-04-23 (×5): 21 mg via TRANSDERMAL
  Filled 2013-04-19 (×5): qty 1

## 2013-04-19 MED ORDER — PREDNISONE 10 MG PO TABS: 10.0000 mg | ORAL_TABLET | Freq: Every day | ORAL | Status: DC

## 2013-04-19 MED ORDER — LISINOPRIL-HYDROCHLOROTHIAZIDE 20-12.5 MG PO TABS: 1.0000 | ORAL_TABLET | Freq: Every day | ORAL | Status: DC

## 2013-04-19 MED ORDER — PREDNISONE 10 MG PO TABS
10.0000 mg | ORAL_TABLET | Freq: Every day | ORAL | Status: DC
Start: 2013-04-19 — End: 2013-04-19

## 2013-04-19 MED ORDER — HYPROMELLOSE (GONIOSCOPIC) 2.5 % OP SOLN: 1.0000 [drp] | Freq: Two times a day (BID) | OPHTHALMIC | Status: DC

## 2013-04-19 MED ORDER — GUAIFENESIN 100 MG/5ML PO SOLN
200.0000 mg | Freq: Three times a day (TID) | ORAL | Status: DC | PRN
  Administered 2013-04-20 – 2013-04-21 (×2): 200 mg via ORAL
  Filled 2013-04-19 (×3): qty 5

## 2013-04-19 NOTE — Progress Notes (Addendum)
CRITICAL VALUE ALERT  Critical value received:  K  2.9  Date of notification:  04/19/13  Time of notification:  3151  Critical value read back:yes  Nurse who received alert:  j Sireen Halk rn  MD notified (1st page):  fanta  Time of first page:  1557  Responding MD:  Legrand Rams  Time MD responded:  7616  Also, read report from Chest Xray to MD

## 2013-04-20 LAB — BASIC METABOLIC PANEL
BUN: 15 mg/dL (ref 6–23)
CALCIUM: 8.5 mg/dL (ref 8.4–10.5)
CO2: 26 mEq/L (ref 19–32)
Chloride: 106 mEq/L (ref 96–112)
Creatinine, Ser: 0.92 mg/dL (ref 0.50–1.35)
GFR, EST NON AFRICAN AMERICAN: 89 mL/min — AB (ref >90)
GLUCOSE: 134 mg/dL — AB (ref 70–99)
Potassium: 4.1 mEq/L (ref 3.7–5.3)
Sodium: 143 mEq/L (ref 137–147)

## 2013-04-20 LAB — CBC
HCT: 38 % — ABNORMAL LOW (ref 39.0–52.0)
HEMOGLOBIN: 13.1 g/dL (ref 13.0–17.0)
MCH: 33.9 pg (ref 26.0–34.0)
MCHC: 34.5 g/dL (ref 30.0–36.0)
MCV: 98.2 fL (ref 78.0–100.0)
PLATELETS: 383 10*3/uL (ref 150–400)
RBC: 3.87 MIL/uL — ABNORMAL LOW (ref 4.22–5.81)
RDW: 13.9 % (ref 11.5–15.5)
WBC: 7.1 10*3/uL (ref 4.0–10.5)

## 2013-04-20 MED ORDER — HYDROCODONE-ACETAMINOPHEN 5-325 MG PO TABS
1.0000 | ORAL_TABLET | ORAL | Status: DC | PRN
  Administered 2013-04-20 – 2013-04-22 (×7): 1 via ORAL
  Filled 2013-04-20 (×6): qty 1

## 2013-04-20 NOTE — Progress Notes (Signed)
Pharmacy Consult: Lovenox for VTE prophylaxis.  Patient Measurements: Height: 5\' 8"  (172.7 cm) Weight: 120 lb 14.4 oz (54.84 kg) IBW/kg (Calculated) : 68.4 Body mass index is 18.39 kg/(m^2).   VITALS Filed Vitals:   04/20/13 0500  BP: 131/80  Pulse: 87  Temp: 97.7 F (36.5 C)  Resp: 20    INR Last Three Days: No results found for this basename: INR,  in the last 72 hours  CBC:    Component Value Date/Time   WBC 7.1 04/20/2013 0603   RBC 3.87* 04/20/2013 0603   HGB 13.1 04/20/2013 0603   HCT 38.0* 04/20/2013 0603   PLT 383 04/20/2013 0603   MCV 98.2 04/20/2013 0603   MCH 33.9 04/20/2013 0603   MCHC 34.5 04/20/2013 0603   RDW 13.9 04/20/2013 0603   LYMPHSABS 2.9 04/19/2013 1446   MONOABS 0.8 04/19/2013 1446   EOSABS 0.0 04/19/2013 1446   BASOSABS 0.0 04/19/2013 1446    RENAL FUNCTION: Estimated Creatinine Clearance: 65.4 ml/min (by C-G formula based on Cr of 0.92).  Assessment: Dose stable for age, weight, renal function and indication.  Plan: Sign off.  Pricilla Larsson, [facility] 04/20/2013 11:50 AM

## 2013-04-20 NOTE — H&P (Signed)
GENEVA PALLAS MRN: [id number] DOB/AGE: August 31, 1950 63 y.o. Primary [place], MD Admit date: 04/19/2013 Chief Complaint:  cough and congestion HPI: This is 63 years old male patient with history of multiple medical illnesses was seen in the office. He was started on oral antibiotics and inhalers. Patient is a heavy tobacco smoker. His chest x-ray showed rt middle lobe infiltrate. Patient was admitted and was started on combination Iv antibiotics. No fever, chills, chest pain, nausea, vomiting or abdominal pain. He has wheezing and shortness of breath.  Past Medical History  Diagnosis Date  . Coronary atherosclerosis of native coronary artery     Mild to moderate NOCAD with 80% ostial diagonal 1/12  . Essential hypertension, benign   . Depression     Prior suicide attempt  . GERD (gastroesophageal reflux disease)     Esophageal dilatation  . Syncope     Neurocardiogenic  . COPD (chronic obstructive pulmonary disease)   . Mixed hyperlipidemia    Past Surgical History  Procedure Laterality Date  . Inguinal hernia repair    . Vasectomy    . Left orchiectomy          Family History  Problem Relation Age of Onset  . Depression Father     Suicide age 37  . Hypertension Mother   . Hypertension Brother     Social History:  reports that he has been smoking Cigarettes.  He has a 38 pack-year smoking history. He has never used smokeless tobacco. He reports that he does not drink alcohol or use illicit drugs.   Allergies: No Known Allergies  Medications Prior to Admission  Medication Sig Dispense Refill  . albuterol (PROVENTIL HFA;VENTOLIN HFA) 108 (90 BASE) MCG/ACT inhaler Inhale 2 puffs into the lungs every 6 (six) hours as needed for wheezing or shortness of breath.      Marland Kitchen aspirin 81 MG tablet Take 81 mg by mouth daily.        . Aspirin-Acetaminophen (GOODY BODY PAIN) 500-325 MG PACK Take 1 packet by mouth every 6 (six) hours as needed (Pain).      Marland Kitchen  azithromycin (ZITHROMAX) 250 MG tablet Take 250-500 mg by mouth daily. Take 2 tablets on day 1 then 1 tablet daily for 4 days.      Marland Kitchen esomeprazole (NEXIUM) 40 MG capsule Take 40 mg by mouth daily before breakfast.        . guaifenesin (ROBITUSSIN) 100 MG/5ML syrup Take 200 mg by mouth 3 (three) times daily as needed for cough.      . hydroxypropyl methylcellulose (ISOPTO TEARS) 2.5 % ophthalmic solution Place 1 drop into both eyes 2 (two) times daily.      Marland Kitchen lisinopril-hydrochlorothiazide (PRINZIDE,ZESTORETIC) 20-12.5 MG per tablet Take 1 tablet by mouth daily.      . pravastatin (PRAVACHOL) 20 MG tablet Take 20 mg by mouth daily.      . predniSONE (DELTASONE) 10 MG tablet Take 10-40 mg by mouth at bedtime. Take 4 tabs daily for 3 days, then 3 tablets daily for 3 days, then 2 tabs daily for 3 days, then 1 tablet daily for 3 days.      . nitroGLYCERIN (NITROSTAT) 0.4 MG SL tablet Place 0.4 mg under the tongue every 5 (five) minutes as needed.            BJS:EGBTD from the symptoms mentioned above,there are no other symptoms referable to all systems reviewed.  Physical Exam: Blood pressure 131/80, pulse 87, temperature 97.7 F (36.5  C), temperature source Oral, resp. rate 20, height 5\' 8"  (1.727 m), weight 120 lb 14.4 oz (54.84 kg), SpO2 94.00%. General Condition Sick looking but in distress HE ENT- pupils equal and reactive, neck supple, Respiratory- decreased air entry, bilateral expiratory wheezes and rhonchi CVS- S1 and S2 heard, no murmur or gallop ABD- soft and lax, bowel sound ++ EXT- no leg edema    Recent Labs  04/19/13 1446 04/20/13 0603  WBC 10.8* 7.1  NEUTROABS 7.0  --   HGB 13.2 13.1  HCT 38.1* 38.0*  MCV 97.9 98.2  PLT 403* 383    Recent Labs  04/19/13 1446 04/20/13 0603  NA 143 143  K 2.9* 4.1  CL 101 106  CO2 31 26  GLUCOSE 113* 134*  BUN 17 15  CREATININE 0.91 0.92  CALCIUM 8.8 8.5  lablast2(ast:2,ALT:2,alkphos:2,bilitot:2,prot:2,albumin:2)@    No  results found for this or any previous visit (from the past 240 hour(s)).   Dg Chest 2 View  04/19/2013   CLINICAL DATA:  Pneumonia.  EXAM: CHEST  2 VIEW  COMPARISON:  DG CHEST 2V dated 04/18/2013  FINDINGS: Mediastinum and hilar structures are normal. Persistent infiltrate present in the right middle lobe. No interim clearing. Heart size and pulmonary vascularity stable. No pleural effusion or pneumothorax. Apical pleural thickening noted consistent with scarring. No acute osseous abnormality.  IMPRESSION: Persistent unchanged right middle lobe infiltrate consistent with pneumonia.   Electronically Signed   By: Marcello Moores  Register   On: 04/19/2013 15:33   Impression: 1. Community acquired Pneumonia 2. COPD 3. CAD 4. Hypertension 5. GERD 6. Tobacco smoking  Active Problems:   * No active hospital problems. *     Plan: Medications reviewed Will continue combination IV antibiotic Continue Nebulizer Continur regular treatment      Christella App   04/20/2013, 9:18 AM

## 2013-04-20 NOTE — Progress Notes (Signed)
Subjective:  Patient was admitted due rt middle lobe pneumonia. Patient is started on antibiotics.  Objective: Vital signs in last 24 hours: Temp:  [97.6 F (36.4 C)-98.2 F (36.8 C)] 97.7 F (36.5 C) (03/14 0500) Pulse Rate:  [75-91] 87 (03/14 0500) Resp:  [20] 20 (03/14 0500) BP: (125-137)/(63-80) 131/80 mmHg (03/14 0500) SpO2:  [94 %-100 %] 94 % (03/14 0725) Weight:  [120 lb 14.4 oz (54.84 kg)] 120 lb 14.4 oz (54.84 kg) (03/14 0500) Weight change:  Last BM Date: 04/18/13  Intake/Output from previous day: 03/13 0701 - 03/14 0700 In: 480 [P.O.:480] Out: -   PHYSICAL EXAM General appearance: alert and no distress Resp: diminished breath sounds bilaterally and wheezes bilaterally Cardio: S1, S2 normal GI: soft, non-tender; bowel sounds normal; no masses,  no organomegaly Extremities: extremities normal, atraumatic, no cyanosis or edema  Lab Results:    @labtest @ ABGS No results found for this basename: PHART, PCO2, PO2ART, TCO2, HCO3,  in the last 72 hours CULTURES No results found for this or any previous visit (from the past 240 hour(s)). Studies/Results: Dg Chest 2 View  04/19/2013   CLINICAL DATA:  Pneumonia.  EXAM: CHEST  2 VIEW  COMPARISON:  DG CHEST 2V dated 04/18/2013  FINDINGS: Mediastinum and hilar structures are normal. Persistent infiltrate present in the right middle lobe. No interim clearing. Heart size and pulmonary vascularity stable. No pleural effusion or pneumothorax. Apical pleural thickening noted consistent with scarring. No acute osseous abnormality.  IMPRESSION: Persistent unchanged right middle lobe infiltrate consistent with pneumonia.   Electronically Signed   By: Marcello Moores  Register   On: 04/19/2013 15:33    Medications: I have reviewed the patient's current medications.  Assesment: 1. Community acquired Pneumonia  2. COPD  3. CAD  4. Hypertension  5. GERD  6. Tobacco smoking  7. hypokalemia   Active Problems:   * No active hospital  problems. *    Plan: Continue iv antibiotics K+ replaced Continue nebulizer and oral steroid   LOS: 1 day   Delisha Peaden 04/20/2013, 9:31 AM

## 2013-04-21 MED ORDER — AZITHROMYCIN 250 MG PO TABS
500.0000 mg | ORAL_TABLET | Freq: Every day | ORAL | Status: DC
  Administered 2013-04-21 – 2013-04-23 (×3): 500 mg via ORAL
  Filled 2013-04-21 (×3): qty 2

## 2013-04-21 MED ORDER — GUAIFENESIN ER 600 MG PO TB12
1200.0000 mg | ORAL_TABLET | Freq: Two times a day (BID) | ORAL | Status: DC
  Administered 2013-04-21 – 2013-04-23 (×4): 1200 mg via ORAL
  Filled 2013-04-21 (×4): qty 2

## 2013-04-21 NOTE — Progress Notes (Signed)
Subjective: Patient feels better today. His cough and congestion is better. No fever or chills.  He is still wheezing.  Objective: Vital signs in last 24 hours: Temp:  [97.6 F (36.4 C)-98.3 F (36.8 C)] 97.6 F (36.4 C) (03/15 0441) Pulse Rate:  [66-87] 66 (03/15 0441) Resp:  [20] 20 (03/15 0441) BP: (132-159)/(70-80) 132/70 mmHg (03/15 0441) SpO2:  [92 %-96 %] 92 % (03/15 0745) Weight change:  Last BM Date: 04/20/13  Intake/Output from previous day: 03/14 0701 - 03/15 0700 In: 720 [P.O.:720] Out: -   PHYSICAL EXAM General appearance: alert and no distress Resp: diminished breath sounds bilaterally and wheezes bilaterally Cardio: S1, S2 normal GI: soft, non-tender; bowel sounds normal; no masses,  no organomegaly Extremities: extremities normal, atraumatic, no cyanosis or edema  Lab Results:    @labtest @ ABGS No results found for this basename: PHART, PCO2, PO2ART, TCO2, HCO3,  in the last 72 hours CULTURES No results found for this or any previous visit (from the past 240 hour(s)). Studies/Results: Dg Chest 2 View  04/19/2013   CLINICAL DATA:  Pneumonia.  EXAM: CHEST  2 VIEW  COMPARISON:  DG CHEST 2V dated 04/18/2013  FINDINGS: Mediastinum and hilar structures are normal. Persistent infiltrate present in the right middle lobe. No interim clearing. Heart size and pulmonary vascularity stable. No pleural effusion or pneumothorax. Apical pleural thickening noted consistent with scarring. No acute osseous abnormality.  IMPRESSION: Persistent unchanged right middle lobe infiltrate consistent with pneumonia.   Electronically Signed   By: Marcello Moores  Register   On: 04/19/2013 15:33    Medications: I have reviewed the patient's current medications.  Assesment: 1. Community acquired Pneumonia  2. COPD  3. CAD  4. Hypertension  5. GERD  6. Tobacco smoking  7. hypokalemia   Active Problems:   * No active hospital problems. *    Plan: Continue iv antibiotics Continue  nebulizer and oral steroid Continue regular treatment   LOS: 2 days   Kyle Lowery 04/21/2013, 8:42 AM

## 2013-04-21 NOTE — Progress Notes (Signed)
PHARMACIST - PHYSICIAN COMMUNICATION CONCERNING: Antibiotic IV to Oral Route Change Policy  RECOMMENDATION: This patient is receiving Zithromax by the intravenous route.  Based on criteria approved by the Pharmacy and Therapeutics Committee, the antibiotic(s) is/are being converted to the equivalent oral dose form(s).   DESCRIPTION: These criteria include:  Patient being treated for a respiratory tract infection, urinary tract infection, cellulitis or clostridium difficile associated diarrhea if on metronidazole  The patient is not neutropenic and does not exhibit a GI malabsorption state  The patient is eating (either orally or via tube) and/or has been taking other orally administered medications for a least 24 hours  The patient is improving clinically and has a Tmax < 100.5  If you have questions about this conversion, please contact the Pharmacy Department  [x]   [phone] )  [facility] []   [phone] )  [facility]  []   [phone] )  [facility] []   [phone] )  [place], [place], [facility] 04/21/2013 2:03 PM

## 2013-04-21 NOTE — Progress Notes (Signed)
Utilization review Completed Xoey Warmoth RN BSN   

## 2013-04-22 DIAGNOSIS — J449 Chronic obstructive pulmonary disease, unspecified: Secondary | ICD-10-CM | POA: Diagnosis present

## 2013-04-22 NOTE — Progress Notes (Signed)
Subjective:  Patient is over all improving. The cough and wheezing is less. Objective: Vital signs in last 24 hours: Temp:  [97.3 F (36.3 C)-98 F (36.7 C)] 97.7 F (36.5 C) (03/16 0456) Pulse Rate:  [85-88] 85 (03/16 0456) Resp:  [20] 20 (03/16 0456) BP: (97-151)/(69-74) 137/69 mmHg (03/16 0456) SpO2:  [94 %-97 %] 97 % (03/16 0456) Weight change:  Last BM Date: 04/20/13  Intake/Output from previous day: 03/15 0701 - 03/16 0700 In: 720 [P.O.:720] Out: -   PHYSICAL EXAM General appearance: alert and no distress Resp: diminished breath sounds bilaterally and wheezes bilaterally Cardio: S1, S2 normal GI: soft, non-tender; bowel sounds normal; no masses,  no organomegaly Extremities: extremities normal, atraumatic, no cyanosis or edema  Lab Results:    @labtest @ ABGS No results found for this basename: PHART, PCO2, PO2ART, TCO2, HCO3,  in the last 72 hours CULTURES No results found for this or any previous visit (from the past 240 hour(s)). Studies/Results: No results found.  Medications: I have reviewed the patient's current medications.  Assesment: 1. Community acquired Pneumonia  2. COPD  3. CAD  4. Hypertension  5. GERD  6. Tobacco smoking  7. hypokalemia   Active Problems:   COPD (chronic obstructive pulmonary disease)    Plan: Continue iv antibiotics Continue nebulizer and oral steroid Continue regular treatment   LOS: 3 days   Derwin Reddy 04/22/2013, 8:18 AM

## 2013-04-22 NOTE — Care Management Note (Addendum)
    Page 1 of 1   04/23/2013     9:03:39 AM   CARE MANAGEMENT NOTE 04/23/2013  Patient:  Kyle Lowery, Kyle Lowery   Account Number:  [id number]  Date Initiated:  04/22/2013  Documentation initiated by:  Theophilus Kinds  Subjective/Objective Assessment:   Pt admitted from home with pneumonia. Pt lives with his wife and will return home at discharge. Pt is independent with ADL's.     Action/Plan:   No CM needs noted.   Anticipated DC Date:  04/24/2013   Anticipated DC Plan:  [place]  CM consult      Choice offered to / List presented to:             Status of service:  Completed, signed off Medicare Important Message given?  YES (If response is "NO", the following Medicare IM given date fields will be blank) Date Medicare IM given:  04/23/2013 Date Additional Medicare IM given:    Discharge Disposition:  HOME/SELF CARE  Per UR Regulation:    If discussed at Long Length of Stay Meetings, dates discussed:    Comments:  04/23/13 0900 Christinia Gully, RN BSN CM Pt discharged home today. no Cm needs noted.  04/22/13 [place], RN BSN CM

## 2013-04-23 MED ORDER — PREDNISONE 10 MG PO TABS: 10.0000 mg | ORAL_TABLET | Freq: Every day | ORAL | Status: DC

## 2013-04-23 MED ORDER — PREDNISONE 20 MG PO TABS: 20.0000 mg | ORAL_TABLET | Freq: Every day | ORAL | Status: DC

## 2013-04-23 MED ORDER — AMOXICILLIN-POT CLAVULANATE 500-125 MG PO TABS: 1.0000 | ORAL_TABLET | Freq: Three times a day (TID) | ORAL | Status: DC

## 2013-04-23 NOTE — Discharge Summary (Signed)
Physician Discharge Summary  Patient ID: Kyle Lowery MRN: [id number] DOB/AGE: 62-03-1950 62 y.o. Primary Care Physician:Nimah Uphoff, MD Admit date: 04/19/2013 Discharge date: 04/23/2013    Discharge Diagnoses:  1. Community acquired Pneumonia  2. COPD  3. CAD  4. Hypertension  5. GERD  6. Tobacco smoking  7. hypokalemia  Active Problems:   COPD (chronic obstructive pulmonary disease)     Medication List    STOP taking these medications       azithromycin 250 MG tablet  Commonly known as:  ZITHROMAX      TAKE these medications       albuterol 108 (90 BASE) MCG/ACT inhaler  Commonly known as:  PROVENTIL HFA;VENTOLIN HFA  Inhale 2 puffs into the lungs every 6 (six) hours as needed for wheezing or shortness of breath.     amoxicillin-clavulanate 500-125 MG per tablet  Commonly known as:  AUGMENTIN  Take 1 tablet (500 mg total) by mouth 3 (three) times daily.     aspirin 81 MG tablet  Take 81 mg by mouth daily.     esomeprazole 40 MG capsule  Commonly known as:  NEXIUM  Take 40 mg by mouth daily before breakfast.     GOODY BODY PAIN 500-325 MG Pack  Generic drug:  Aspirin-Acetaminophen  Take 1 packet by mouth every 6 (six) hours as needed (Pain).     guaifenesin 100 MG/5ML syrup  Commonly known as:  ROBITUSSIN  Take 200 mg by mouth 3 (three) times daily as needed for cough.     hydroxypropyl methylcellulose 2.5 % ophthalmic solution  Commonly known as:  ISOPTO TEARS  Place 1 drop into both eyes 2 (two) times daily.     lisinopril-hydrochlorothiazide 20-12.5 MG per tablet  Commonly known as:  PRINZIDE,ZESTORETIC  Take 1 tablet by mouth daily.     nitroGLYCERIN 0.4 MG SL tablet  Commonly known as:  NITROSTAT  Place 0.4 mg under the tongue every 5 (five) minutes as needed.     pravastatin 20 MG tablet  Commonly known as:  PRAVACHOL  Take 20 mg by mouth daily.     predniSONE 10 MG tablet  Commonly known as:  DELTASONE  Take 10-40 mg by mouth at  bedtime. Take 4 tabs daily for 3 days, then 3 tablets daily for 3 days, then 2 tabs daily for 3 days, then 1 tablet daily for 3 days.     predniSONE 20 MG tablet  Commonly known as:  DELTASONE  Take 1 tablet (20 mg total) by mouth at bedtime.  Start taking on:  04/24/2013     predniSONE 10 MG tablet  Commonly known as:  DELTASONE  Take 1 tablet (10 mg total) by mouth at bedtime.  Start taking on:  04/27/2013        Discharged Condition: improved    Consults: none  Significant Diagnostic Studies: Dg Chest 2 View  04/19/2013   CLINICAL DATA:  Pneumonia.  EXAM: CHEST  2 VIEW  COMPARISON:  DG CHEST 2V dated 04/18/2013  FINDINGS: Mediastinum and hilar structures are normal. Persistent infiltrate present in the right middle lobe. No interim clearing. Heart size and pulmonary vascularity stable. No pleural effusion or pneumothorax. Apical pleural thickening noted consistent with scarring. No acute osseous abnormality.  IMPRESSION: Persistent unchanged right middle lobe infiltrate consistent with pneumonia.   Electronically Signed   By: Marcello Moores  Register   On: 04/19/2013 15:33    Lab Results: Basic Metabolic Panel: No results found for this basename:  NA, K, CL, CO2, GLUCOSE, BUN, CREATININE, CALCIUM, MG, PHOS,  in the last 72 hours Liver Function Tests: No results found for this basename: AST, ALT, ALKPHOS, BILITOT, PROT, ALBUMIN,  in the last 72 hours   CBC: No results found for this basename: WBC, NEUTROABS, HGB, HCT, MCV, PLT,  in the last 72 hours  No results found for this or any previous visit (from the past 240 hour(s)).   Hospital Course:  This is a 63 years old male patient with history of multiple medical illnesses was admitted due to rt middle lobe pneumonia. Patient received combinations Iv antibiotics and improved.  Discharge Exam: Blood pressure 130/92, pulse 77, temperature 97.5 F (36.4 C), temperature source Oral, resp. rate 20, height 5\' 8"  (1.727 m), weight 120 lb  14.4 oz (54.84 kg), SpO2 95.00%.   Disposition:  Home        Follow-up Information   Follow up with [facility], MD In 1 week.   Specialty:  Internal Medicine   Contact information:   [address] [address] [phone]       Signed: Rosita Fire   04/23/2013, 8:22 AM

## 2013-04-23 NOTE — Progress Notes (Signed)
Discharge summary sent to payer through [facility]  

## 2013-07-10 ENCOUNTER — Ambulatory Visit (INDEPENDENT_AMBULATORY_CARE_PROVIDER_SITE_OTHER): Payer: BC Managed Care – PPO | Admitting: Cardiology

## 2013-07-10 ENCOUNTER — Encounter: Payer: Self-pay | Admitting: Cardiology

## 2013-07-10 VITALS — BP 93/60 | HR 71 | Ht 68.0 in | Wt 122.4 lb

## 2013-07-10 DIAGNOSIS — E785 Hyperlipidemia, unspecified: Secondary | ICD-10-CM | POA: Diagnosis not present

## 2013-07-10 DIAGNOSIS — I10 Essential (primary) hypertension: Secondary | ICD-10-CM | POA: Diagnosis not present

## 2013-07-10 DIAGNOSIS — I2510 Atherosclerotic heart disease of native coronary artery without angina pectoris: Secondary | ICD-10-CM | POA: Diagnosis not present

## 2013-07-10 DIAGNOSIS — F17200 Nicotine dependence, unspecified, uncomplicated: Secondary | ICD-10-CM

## 2013-07-10 MED ORDER — LISINOPRIL-HYDROCHLOROTHIAZIDE 20-12.5 MG PO TABS: 0.5000 | ORAL_TABLET | Freq: Every day | ORAL | Status: DC

## 2013-07-10 NOTE — Assessment & Plan Note (Signed)
Blood pressure has been low, he reports intermittent dizziness. Cut Prinzide dose to half a pill a day.

## 2013-07-10 NOTE — Progress Notes (Signed)
Clinical Summary  [name] is a 63 y.o.male last seen in June 2014. Record review finds hospitalization at [facility] in March of this year with community-acquired pneumonia and COPD. and he states that he is back to baseline. Still smoking, has not been able to quit. He has tried to cut back. He does report being dizzy when he stands, blood pressure is somewhat low today. He has had to cut back on antihypertensives with Dr. Legrand Rams.  ECG from March of this year showed sinus rhythm with borderline prolonged QT.  He is not reporting any angina symptoms or nitroglycerin use.  No Known Allergies  Current Outpatient Prescriptions  Medication Sig Dispense Refill  . albuterol (PROVENTIL HFA;VENTOLIN HFA) 108 (90 BASE) MCG/ACT inhaler Inhale 2 puffs into the lungs every 6 (six) hours as needed for wheezing or shortness of breath.      Marland Kitchen aspirin 81 MG tablet Take 81 mg by mouth daily.        Marland Kitchen esomeprazole (NEXIUM) 40 MG capsule Take 40 mg by mouth daily before breakfast.        . hydroxypropyl methylcellulose (ISOPTO TEARS) 2.5 % ophthalmic solution Place 1 drop into both eyes 2 (two) times daily.      Marland Kitchen lisinopril-hydrochlorothiazide (PRINZIDE,ZESTORETIC) 20-12.5 MG per tablet Take 1 tablet by mouth daily.      . nitroGLYCERIN (NITROSTAT) 0.4 MG SL tablet Place 0.4 mg under the tongue every 5 (five) minutes as needed.       . pravastatin (PRAVACHOL) 20 MG tablet Take 20 mg by mouth daily.       No current facility-administered medications for this visit.    Past Medical History  Diagnosis Date  . Coronary atherosclerosis of native coronary artery     Mild to moderate NOCAD with 80% ostial diagonal 1/12  . Essential hypertension, benign   . Depression     Prior suicide attempt  . GERD (gastroesophageal reflux disease)     Esophageal dilatation  . Syncope     Neurocardiogenic  . COPD (chronic obstructive pulmonary disease)   . Mixed hyperlipidemia     Social History Kyle Lowery  reports that he has been smoking Cigarettes.  He has a 38 pack-year smoking history. He has never used smokeless tobacco. Kyle Lowery reports that he does not drink alcohol.  Review of Systems No palpitations or syncope. No bleeding problems. Stable appetite. Otherwise as outlined.  Physical Examination Filed Vitals:   07/10/13 1028  BP: 93/60  Pulse: 71   Filed Weights   07/10/13 1028  Weight: 122 lb 6.4 oz (55.52 kg)    Chronically ill-appearing male, comfortable at rest.  HEENT: Conjunctiva and lids normal, oropharynx with poor dentition.  Neck: Supple, no elevated JVP, no carotid bruits or thyromegaly.  Lungs: Diminished breath sounds, and no wheezing, nonlabored breathing.  Cardiac: Regular rate and rhythm, no S3 gallop or pericardial rub. S4.  Abdomen: Soft, nontender, bowel sounds present.  Skin: Warm and dry.  Extremities: No pitting edema, distal pulses one plus.    Problem List and Plan   CORONARY ATHEROSCLEROSIS NATIVE CORONARY ARTERY Symptomatically stable on medical therapy, ECG from March reviewed. Continue observation.  Essential hypertension, benign Blood pressure has been low, he reports intermittent dizziness. Cut Prinzide dose to half a pill a day.  TOBACCO ABUSE Has not been able to quit smoking. We continue to discuss smoking cessation strategies.  HYPERLIPIDEMIA He continues on Pravachol, followed by Dr. Legrand Rams.    Kyle Lowery  Kyle Lowery, M.D., F.A.C.C.

## 2013-07-10 NOTE — Addendum Note (Signed)
Addended by: Merlene Laughter on: 07/10/2013 10:54 AM   Modules accepted: Orders

## 2013-07-10 NOTE — Assessment & Plan Note (Signed)
Has not been able to quit smoking. We continue to discuss smoking cessation strategies.

## 2013-07-10 NOTE — Assessment & Plan Note (Signed)
Symptomatically stable on medical therapy, ECG from March reviewed. Continue observation.

## 2013-07-10 NOTE — Patient Instructions (Signed)
Your physician recommends that you schedule a follow-up appointment in: 6 months. You will receive a reminder letter in the mail in about 4 months reminding you to call and schedule your appointment. If you don't receive this letter, please contact our office. Your physician has recommended you make the following change in your medication:  Decrease your lisinopril/hct 20/12.5 mg to half tablet daily. Your new prescription was sent to your pharmacy. Continue all other medications the same.

## 2013-07-10 NOTE — Assessment & Plan Note (Signed)
He continues on Pravachol, followed by Dr. Legrand Rams.

## 2013-11-13 DIAGNOSIS — Z23 Encounter for immunization: Secondary | ICD-10-CM | POA: Diagnosis not present

## 2014-04-22 ENCOUNTER — Encounter: Payer: Self-pay | Admitting: Cardiology

## 2014-04-22 ENCOUNTER — Ambulatory Visit (INDEPENDENT_AMBULATORY_CARE_PROVIDER_SITE_OTHER): Payer: BLUE CROSS/BLUE SHIELD | Admitting: Cardiology

## 2014-04-22 VITALS — BP 122/68 | HR 71 | Ht 68.0 in | Wt 124.0 lb

## 2014-04-22 DIAGNOSIS — E782 Mixed hyperlipidemia: Secondary | ICD-10-CM

## 2014-04-22 DIAGNOSIS — Z720 Tobacco use: Secondary | ICD-10-CM

## 2014-04-22 DIAGNOSIS — I6529 Occlusion and stenosis of unspecified carotid artery: Secondary | ICD-10-CM

## 2014-04-22 DIAGNOSIS — I10 Essential (primary) hypertension: Secondary | ICD-10-CM | POA: Diagnosis not present

## 2014-04-22 DIAGNOSIS — I2510 Atherosclerotic heart disease of native coronary artery without angina pectoris: Secondary | ICD-10-CM | POA: Diagnosis not present

## 2014-04-22 MED ORDER — NITROGLYCERIN 0.4 MG SL SUBL: 0.4000 mg | SUBLINGUAL_TABLET | SUBLINGUAL | Status: DC | PRN

## 2014-04-22 NOTE — Patient Instructions (Signed)
Your physician recommends that you schedule a follow-up appointment in: 6 months. You will receive a reminder letter in the mail in about 4 months reminding you to call and schedule your appointment. If you don't receive this letter, please contact our office. Your physician recommends that you continue on your current medications as directed. Please refer to the Current Medication list given to you today. Your physician has requested that you have a carotid duplex. This test is an ultrasound of the carotid arteries in your neck. It looks at blood flow through these arteries that supply the brain with blood. Allow one hour for this exam. There are no restrictions or special instructions.

## 2014-04-22 NOTE — Progress Notes (Signed)
Cardiology Office Note  Date: 04/22/2014   ID: Kyle Lowery, DOB [birth year], MRN [id number]  PCP: Rosita Fire, MD  Primary Cardiologist: Rozann Lesches, MD   Chief Complaint  Patient presents with  . Coronary Artery Disease  . Hypertension  . Hyperlipidemia    History of Present Illness: Kyle Lowery is a 64 y.o. male last seen in June 2015. He presents for a routine follow-up visit. Reports no significant angina symptoms or nitroglycerin use, he has an old bottle. We talked about getting a refill. ECG done today and reviewed below shows normal sinus rhythm. He reports no change in his medications since last visit. At the last visit, Prinzide dose was cut in half, due to dizziness and relatively low blood pressure. Blood pressure is normal today. He states he will be seeing Dr. Legrand Rams in the next one to 2 months for a physical with lab work.  Today we reviewed his cardiac testing and carotid Dopplers from 2012, outlined below. He is due for a follow-up carotid Doppler which we will arrange.  We continue to address smoking cessation, he has not been able to quit however.  He continues on Pravachol, has tolerated this without side effects, and will be having a follow-up lipid panel when he sees Dr. Legrand Rams.  Past Medical History  Diagnosis Date  . Coronary atherosclerosis of native coronary artery     Mild to moderate NOCAD with 80% ostial diagonal 1/12  . Essential hypertension, benign   . Depression     Prior suicide attempt  . GERD (gastroesophageal reflux disease)     Esophageal dilatation  . Syncope     Neurocardiogenic  . COPD (chronic obstructive pulmonary disease)   . Mixed hyperlipidemia     Past Surgical History  Procedure Laterality Date  . Inguinal hernia repair    . Vasectomy    . Left orchiectomy      Current Outpatient Prescriptions  Medication Sig Dispense Refill  . albuterol (PROVENTIL HFA;VENTOLIN HFA) 108 (90 BASE) MCG/ACT inhaler Inhale 2  puffs into the lungs every 6 (six) hours as needed for wheezing or shortness of breath.    Marland Kitchen aspirin 81 MG tablet Take 81 mg by mouth daily.      Marland Kitchen esomeprazole (NEXIUM) 40 MG capsule Take 40 mg by mouth daily before breakfast.      . hydroxypropyl methylcellulose (ISOPTO TEARS) 2.5 % ophthalmic solution Place 1 drop into both eyes 2 (two) times daily.    Marland Kitchen lisinopril-hydrochlorothiazide (PRINZIDE,ZESTORETIC) 20-12.5 MG per tablet Take 0.5 tablets by mouth daily. 45 tablet 3  . nitroGLYCERIN (NITROSTAT) 0.4 MG SL tablet Place 1 tablet (0.4 mg total) under the tongue every 5 (five) minutes x 3 doses as needed. 25 tablet 3  . pravastatin (PRAVACHOL) 20 MG tablet Take 20 mg by mouth daily.     No current facility-administered medications for this visit.    Allergies:  Review of patient's allergies indicates no known allergies.   Social History: The patient  reports that he has been smoking Cigarettes.  He has a 38 pack-year smoking history. He has never used smokeless tobacco. He reports that he does not drink alcohol or use illicit drugs.    ROS:  Please see the history of present illness. Otherwise, complete review of systems is positive for occasional headaches, aches and pains.  All other systems are reviewed and negative.    Physical Exam: VS:  BP 122/68 mmHg  Pulse 71  Ht 5\' 8"  (  1.727 m)  Wt 124 lb (56.246 kg)  BMI 18.86 kg/m2  SpO2 99%, BMI Body mass index is 18.86 kg/(m^2).  Wt Readings from Last 3 Encounters:  04/22/14 124 lb (56.246 kg)  07/10/13 122 lb 6.4 oz (55.52 kg)  04/20/13 120 lb 14.4 oz (54.84 kg)     Appears comfortable at rest.  HEENT: Conjunctiva and lids normal, oropharynx with poor dentition.  Neck: Supple, no elevated JVP, no carotid bruits or thyromegaly.  Lungs: Diminished breath sounds, and no wheezing, nonlabored breathing.  Cardiac: Regular rate and rhythm, no S3 gallop or pericardial rub. S4.  Abdomen: Soft, nontender, bowel sounds present.   Skin: Warm and dry.  Extremities: No pitting edema, distal pulses one plus.  Musculoskeletal: No kyphosis. Neuropsychiatric: Alert and oriented 3, affect appropriate.   ECG: ECG is ordered today and reviewed showing normal sinus rhythm.   Recent Labwork:  Lab work from March 2015 showed potassium 4.1, BUN 15, creatinine 0.9, hemoglobin 13.1, platelets 383.  Other Studies Reviewed Today:  Carotid Dopplers ([facility]) from June 3149 showed 70% LICA stenosis and 26-37% RCA stenosis.  Lexiscan Cardiolite [facility]) 07/15/2010 showed no diagnostic ST segment changes, inferior attenuation artifact without ischemia, LVEF 64%.  Assessment and Plan:  1. Symptomatically stable CAD as documented above. Continue medical therapy, refill given for fresh bottle of nitroglycerin.  2. Carotid artery disease, moderate by carotid Dopplers in 2012. Repeat study will be obtained.  3. Ongoing tobacco abuse. We continue to discuss smoking cessation.  4. Hyperlipidemia, on Pravachol. He will get repeat lipid panel when he sees Dr. Legrand Rams.  5. Blood pressure well controlled. No change in current regimen.  Current medicines are reviewed at length with the patient today.  The patient does not have concerns regarding medicines.   Orders Placed This Encounter  Procedures  . EKG 12-Lead  . Doppler carotid    Disposition: FU with me in 6 months.   Signed, Satira Sark, MD, [facility] 04/22/2014 9:27 AM    [place] at [place], [place], [place] [address] Phone: [phone]; Fax: [phone]

## 2014-04-30 ENCOUNTER — Encounter (INDEPENDENT_AMBULATORY_CARE_PROVIDER_SITE_OTHER): Payer: BLUE CROSS/BLUE SHIELD

## 2014-04-30 DIAGNOSIS — I6523 Occlusion and stenosis of bilateral carotid arteries: Secondary | ICD-10-CM | POA: Diagnosis not present

## 2014-04-30 DIAGNOSIS — I6529 Occlusion and stenosis of unspecified carotid artery: Secondary | ICD-10-CM

## 2014-05-02 ENCOUNTER — Telehealth: Payer: Self-pay | Admitting: *Deleted

## 2014-05-02 NOTE — Telephone Encounter (Signed)
-----   Message from Satira Sark, MD sent at 04/30/2014 11:59 AM EDT ----- Reviewed report. There has been progression in carotid atherosclerosis since 2012. There is 40-59% RICA stenosis and 08-81% LICA stenosis. Continue medical therapy for now, he will need to have a follow-up carotid Doppler in about 6 months.

## 2014-05-02 NOTE — Telephone Encounter (Signed)
Patient informed. 

## 2014-08-27 ENCOUNTER — Other Ambulatory Visit: Payer: Self-pay | Admitting: Cardiology

## 2014-08-27 DIAGNOSIS — I6523 Occlusion and stenosis of bilateral carotid arteries: Secondary | ICD-10-CM

## 2014-10-08 DIAGNOSIS — J449 Chronic obstructive pulmonary disease, unspecified: Secondary | ICD-10-CM | POA: Diagnosis not present

## 2014-10-08 DIAGNOSIS — I2510 Atherosclerotic heart disease of native coronary artery without angina pectoris: Secondary | ICD-10-CM | POA: Diagnosis not present

## 2014-10-08 DIAGNOSIS — F17200 Nicotine dependence, unspecified, uncomplicated: Secondary | ICD-10-CM | POA: Diagnosis not present

## 2014-10-08 DIAGNOSIS — I10 Essential (primary) hypertension: Secondary | ICD-10-CM | POA: Diagnosis not present

## 2014-10-15 ENCOUNTER — Ambulatory Visit (INDEPENDENT_AMBULATORY_CARE_PROVIDER_SITE_OTHER): Payer: BLUE CROSS/BLUE SHIELD

## 2014-10-15 DIAGNOSIS — I6523 Occlusion and stenosis of bilateral carotid arteries: Secondary | ICD-10-CM

## 2014-10-16 ENCOUNTER — Telehealth: Payer: Self-pay | Admitting: *Deleted

## 2014-10-16 NOTE — Telephone Encounter (Signed)
-----   Message from Satira Sark, MD sent at 10/16/2014  9:39 AM EDT ----- Reviewed report. Stable, 40-59% bilateral ICA stenoses. Continue medical therapy.

## 2014-10-16 NOTE — Telephone Encounter (Signed)
Notes Recorded by Laurine Blazer, LPN on 08/16/9870 at 1:58 PM Patient notified. Copy fwd to pmd. 6 mo recall due this September. Scheduled for 10/31/2014 with Dr. Domenic Polite.

## 2014-10-31 ENCOUNTER — Encounter: Payer: Self-pay | Admitting: *Deleted

## 2014-10-31 ENCOUNTER — Encounter: Payer: Self-pay | Admitting: Cardiology

## 2014-10-31 ENCOUNTER — Ambulatory Visit (INDEPENDENT_AMBULATORY_CARE_PROVIDER_SITE_OTHER): Payer: BLUE CROSS/BLUE SHIELD | Admitting: Cardiology

## 2014-10-31 VITALS — BP 122/78 | HR 81 | Ht 68.0 in | Wt 126.0 lb

## 2014-10-31 DIAGNOSIS — I10 Essential (primary) hypertension: Secondary | ICD-10-CM

## 2014-10-31 DIAGNOSIS — E782 Mixed hyperlipidemia: Secondary | ICD-10-CM | POA: Diagnosis not present

## 2014-10-31 DIAGNOSIS — I2510 Atherosclerotic heart disease of native coronary artery without angina pectoris: Secondary | ICD-10-CM | POA: Diagnosis not present

## 2014-10-31 DIAGNOSIS — I6523 Occlusion and stenosis of bilateral carotid arteries: Secondary | ICD-10-CM

## 2014-10-31 NOTE — Progress Notes (Signed)
Cardiology Office Note  Date: 10/31/2014   ID: Kyle Lowery, DOB 05/11/1950, MRN [id number]  PCP: Rosita Fire, MD  Primary Cardiologist: Rozann Lesches, MD   Chief Complaint  Patient presents with  . Coronary Artery Disease    History of Present Illness: Kyle Lowery is a 64 y.o. male last seen in March. He presents for routine follow-up visit. Reports no angina symptoms with any increasing frequency. He has not used any nitroglycerin recently.  We reviewed his medications, stable from a cardiac perspective. He reports having recent lab work with Dr. Legrand Rams with assessment of lipids. He continues on Pravachol.  Follow-up carotid Dopplers were recently done showing stable bilateral ICA disease of moderate degree.  Still struggles with trying to quit smoking cigarettes.   Past Medical History  Diagnosis Date  . Coronary atherosclerosis of native coronary artery     Mild to moderate NOCAD with 80% ostial diagonal 1/12  . Essential hypertension, benign   . Depression     Prior suicide attempt  . GERD (gastroesophageal reflux disease)     Esophageal dilatation  . Syncope     Neurocardiogenic  . COPD (chronic obstructive pulmonary disease)   . Mixed hyperlipidemia     Current Outpatient Prescriptions  Medication Sig Dispense Refill  . albuterol (PROVENTIL HFA;VENTOLIN HFA) 108 (90 BASE) MCG/ACT inhaler Inhale 2 puffs into the lungs every 6 (six) hours as needed for wheezing or shortness of breath.    Marland Kitchen aspirin 81 MG tablet Take 81 mg by mouth daily.      . hydroxypropyl methylcellulose (ISOPTO TEARS) 2.5 % ophthalmic solution Place 1 drop into both eyes 2 (two) times daily.    Marland Kitchen lisinopril-hydrochlorothiazide (PRINZIDE,ZESTORETIC) 20-12.5 MG per tablet Take 0.5 tablets by mouth daily. 45 tablet 3  . nitroGLYCERIN (NITROSTAT) 0.4 MG SL tablet Place 1 tablet (0.4 mg total) under the tongue every 5 (five) minutes x 3 doses as needed. 25 tablet 3  . omeprazole  (PRILOSEC) 20 MG capsule Take 20 mg by mouth daily.    . pravastatin (PRAVACHOL) 20 MG tablet Take 20 mg by mouth daily.     No current facility-administered medications for this visit.    Allergies:  Review of patient's allergies indicates no known allergies.   Social History: The patient  reports that he has been smoking Cigarettes.  He has a 38 pack-year smoking history. He has never used smokeless tobacco. He reports that he does not drink alcohol or use illicit drugs.   ROS:  Please see the history of present illness. Otherwise, complete review of systems is positive for none.  All other systems are reviewed and negative.   Physical Exam: VS:  BP 122/78 mmHg  Pulse 81  Ht 5\' 8"  (1.727 m)  Wt 126 lb (57.153 kg)  BMI 19.16 kg/m2  SpO2 99%, BMI Body mass index is 19.16 kg/(m^2).  Wt Readings from Last 3 Encounters:  10/31/14 126 lb (57.153 kg)  04/22/14 124 lb (56.246 kg)  07/10/13 122 lb 6.4 oz (55.52 kg)     Appears comfortable at rest.  HEENT: Conjunctiva and lids normal, oropharynx with poor dentition.  Neck: Supple, no elevated JVP, no carotid bruits or thyromegaly.  Lungs: Diminished breath sounds, and no wheezing, nonlabored breathing.  Cardiac: Regular rate and rhythm, no S3 gallop or pericardial rub. S4.  Abdomen: Soft, nontender, bowel sounds present.  Skin: Warm and dry.  Extremities: No pitting edema, distal pulses one plus.  Musculoskeletal: No kyphosis. Neuropsychiatric:  Alert and oriented 3, affect appropriate.   ECG: ECG is not ordered today.  Other Studies Reviewed Today:  Lexiscan Cardiolite [facility]) 07/15/2010 showed no diagnostic ST segment changes, inferior attenuation artifact without ischemia, LVEF 64%.  Carotid Dopplers 10/15/2014 revealed stable 40-59% bilateral ICA stenoses.  Assessment and Plan:  1. History of CAD as outlined above, largely nonobstructive, and symptomatically stable at this time. Plan is to continue medical  therapy.  2. Hyperlipidemia, on Pravachol. Requesting most recent lab work from Dr. Legrand Rams.  3. Essential hypertension, blood pressure control is good today.  Current medicines were reviewed with the patient today.  Disposition: FU with me in 6 months.   Signed, Satira Sark, MD, [facility] 10/31/2014 10:54 AM    [place] at [place], [place], [place] [address] Phone: [phone]; Fax: [phone]

## 2014-10-31 NOTE — Patient Instructions (Signed)
Your physician recommends that you continue on your current medications as directed. Please refer to the Current Medication list given to you today. Your physician recommends that you schedule a follow-up appointment in: 6 months. You will receive a reminder letter in the mail in about 4 months reminding you to call and schedule your appointment. If you don't receive this letter, please contact our office. 

## 2015-04-07 DIAGNOSIS — H25813 Combined forms of age-related cataract, bilateral: Secondary | ICD-10-CM | POA: Diagnosis not present

## 2015-04-16 DIAGNOSIS — H2512 Age-related nuclear cataract, left eye: Secondary | ICD-10-CM | POA: Diagnosis not present

## 2015-04-16 DIAGNOSIS — H25812 Combined forms of age-related cataract, left eye: Secondary | ICD-10-CM | POA: Diagnosis not present

## 2015-12-23 DIAGNOSIS — Z23 Encounter for immunization: Secondary | ICD-10-CM | POA: Diagnosis not present

## 2016-01-12 ENCOUNTER — Other Ambulatory Visit (HOSPITAL_COMMUNITY): Payer: Self-pay | Admitting: Internal Medicine

## 2016-01-12 DIAGNOSIS — F17200 Nicotine dependence, unspecified, uncomplicated: Secondary | ICD-10-CM

## 2016-01-12 DIAGNOSIS — Z23 Encounter for immunization: Secondary | ICD-10-CM | POA: Diagnosis not present

## 2016-01-12 DIAGNOSIS — E784 Other hyperlipidemia: Secondary | ICD-10-CM | POA: Diagnosis not present

## 2016-01-12 DIAGNOSIS — I2510 Atherosclerotic heart disease of native coronary artery without angina pectoris: Secondary | ICD-10-CM | POA: Diagnosis not present

## 2016-01-12 DIAGNOSIS — J449 Chronic obstructive pulmonary disease, unspecified: Secondary | ICD-10-CM | POA: Diagnosis not present

## 2016-01-12 DIAGNOSIS — I10 Essential (primary) hypertension: Secondary | ICD-10-CM | POA: Diagnosis not present

## 2016-01-12 DIAGNOSIS — Z0001 Encounter for general adult medical examination with abnormal findings: Secondary | ICD-10-CM | POA: Diagnosis not present

## 2016-01-21 ENCOUNTER — Ambulatory Visit (HOSPITAL_COMMUNITY)
Admission: RE | Admit: 2016-01-21 | Discharge: 2016-01-21 | Disposition: A | Discharge status: Home / Self Care | Payer: Medicare Other | Source: Ambulatory Visit | Attending: Internal Medicine | Admitting: Internal Medicine

## 2016-01-21 DIAGNOSIS — Z87891 Personal history of nicotine dependence: Secondary | ICD-10-CM | POA: Diagnosis not present

## 2016-01-21 DIAGNOSIS — R918 Other nonspecific abnormal finding of lung field: Secondary | ICD-10-CM | POA: Diagnosis not present

## 2016-01-21 DIAGNOSIS — J439 Emphysema, unspecified: Secondary | ICD-10-CM | POA: Diagnosis not present

## 2016-01-21 DIAGNOSIS — I2510 Atherosclerotic heart disease of native coronary artery without angina pectoris: Secondary | ICD-10-CM | POA: Diagnosis not present

## 2016-01-21 DIAGNOSIS — F17200 Nicotine dependence, unspecified, uncomplicated: Secondary | ICD-10-CM | POA: Diagnosis not present

## 2016-01-21 DIAGNOSIS — I700 Atherosclerosis of aorta: Secondary | ICD-10-CM | POA: Insufficient documentation

## 2016-01-22 DIAGNOSIS — F17200 Nicotine dependence, unspecified, uncomplicated: Secondary | ICD-10-CM | POA: Diagnosis not present

## 2016-01-22 DIAGNOSIS — R634 Abnormal weight loss: Secondary | ICD-10-CM | POA: Diagnosis not present

## 2016-01-22 DIAGNOSIS — J984 Other disorders of lung: Secondary | ICD-10-CM | POA: Diagnosis not present

## 2016-01-22 DIAGNOSIS — J449 Chronic obstructive pulmonary disease, unspecified: Secondary | ICD-10-CM | POA: Diagnosis not present

## 2016-02-12 ENCOUNTER — Encounter: Payer: Self-pay | Admitting: Pulmonary Disease

## 2016-02-12 ENCOUNTER — Telehealth: Payer: Self-pay | Admitting: Pulmonary Disease

## 2016-02-12 ENCOUNTER — Other Ambulatory Visit: Payer: Medicare Other

## 2016-02-12 ENCOUNTER — Ambulatory Visit (INDEPENDENT_AMBULATORY_CARE_PROVIDER_SITE_OTHER): Payer: BLUE CROSS/BLUE SHIELD | Admitting: Pulmonary Disease

## 2016-02-12 VITALS — BP 132/84 | HR 53 | Ht 68.0 in | Wt 130.0 lb

## 2016-02-12 DIAGNOSIS — F17200 Nicotine dependence, unspecified, uncomplicated: Secondary | ICD-10-CM

## 2016-02-12 DIAGNOSIS — R1310 Dysphagia, unspecified: Secondary | ICD-10-CM

## 2016-02-12 DIAGNOSIS — F32A Depression, unspecified: Secondary | ICD-10-CM | POA: Insufficient documentation

## 2016-02-12 DIAGNOSIS — R1319 Other dysphagia: Secondary | ICD-10-CM

## 2016-02-12 DIAGNOSIS — J439 Emphysema, unspecified: Secondary | ICD-10-CM | POA: Diagnosis not present

## 2016-02-12 DIAGNOSIS — K219 Gastro-esophageal reflux disease without esophagitis: Secondary | ICD-10-CM | POA: Diagnosis not present

## 2016-02-12 DIAGNOSIS — R918 Other nonspecific abnormal finding of lung field: Secondary | ICD-10-CM | POA: Diagnosis not present

## 2016-02-12 DIAGNOSIS — F329 Major depressive disorder, single episode, unspecified: Secondary | ICD-10-CM | POA: Insufficient documentation

## 2016-02-12 DIAGNOSIS — F17210 Nicotine dependence, cigarettes, uncomplicated: Secondary | ICD-10-CM

## 2016-02-12 NOTE — Patient Instructions (Signed)
   Try using Nicotine 14mg  patches daily along with nicotine gum for your intermittent nicotine cravings to help you quit.  I will see you back after you have had your chest CT scan done and your breathing test done.  I'm also checking a blood test day to see if that may be the cause for the emphysema I am seen on your CT scan.  I will see you back in 6 weeks but call if you have any questions or concerns.  TESTS ORDERED: 1. LD CT CHEST W/O in 4 weeks 2. Serum Alpha-1 Antitrypsin Phenotype today 3. Full PFTs before followup appointment

## 2016-02-12 NOTE — Progress Notes (Signed)
Subjective:    Patient ID: Kyle Lowery, male    DOB: 12-03-1950, 66 y.o.   MRN: [id number]  HPI He reports he was seen in early December 2017 for repeat cases of "bronchitis" starting in November. He was treated with Prednisone, an inhaler and an antibiotic. He improved somewhat from November but then became sick again. He was not treated with his episode but was for the second. He reports he was wheezing and had a cough productive of a "dark yellow-green" phlegm. He denies any hemoptysis. He does feel his cough has improved but he still is producing a "yellow-green" mucus. He is still wheezing some. He reports only occasional dyspnea on exertion. No chest pain, tightness, or pressure. No fever, chills, or sweats. He has lost 15 pounds since November. No joint swelling, stiffness, or pain. No rashes but does bruise easily. He reports occasional headaches. No focal weakness, numbness, or tingling. He uses his albuterol inhaler 1-2 times a day for his cough but it doesn't seem to help. Doesn't recall ever having been diagnosed with COPD. He reports he previously tried patches to help him quit.   Review of Systems No adenopathy in his neck, groin, or axilla. Does have occasional nausea. No abdominal pain or diarrhea. He reports he has had dysphagia and has prior esophageal dilation. His last dilation was approximately 2 years ago. Does have recurrent reflux. A pertinent 14 point review of systems is negative except as per the history of presenting illness.  No Known Allergies  Current Outpatient Prescriptions on File Prior to Visit  Medication Sig Dispense Refill  . albuterol (PROVENTIL HFA;VENTOLIN HFA) 108 (90 BASE) MCG/ACT inhaler Inhale 2 puffs into the lungs every 6 (six) hours as needed for wheezing or shortness of breath.    Marland Kitchen aspirin 81 MG tablet Take 81 mg by mouth daily.      Marland Kitchen lisinopril-hydrochlorothiazide (PRINZIDE,ZESTORETIC) 20-12.5 MG per tablet Take 0.5 tablets by mouth daily. 45  tablet 3  . nitroGLYCERIN (NITROSTAT) 0.4 MG SL tablet Place 1 tablet (0.4 mg total) under the tongue every 5 (five) minutes x 3 doses as needed. 25 tablet 3  . omeprazole (PRILOSEC) 20 MG capsule Take 20 mg by mouth daily.    . pravastatin (PRAVACHOL) 20 MG tablet Take 20 mg by mouth daily.     No current facility-administered medications on file prior to visit.     Past Medical History:  Diagnosis Date  . Carotid artery stenosis   . COPD (chronic obstructive pulmonary disease) ([place])   . Coronary atherosclerosis of native coronary artery    Mild to moderate NOCAD with 80% ostial diagonal 1/12  . Depression    Prior suicide attempt  . Essential hypertension, benign   . GERD (gastroesophageal reflux disease)    Esophageal dilatation  . Mixed hyperlipidemia   . Syncope    Neurocardiogenic    Past Surgical History:  Procedure Laterality Date  . ESOPHAGOGASTRODUODENOSCOPY (EGD) WITH ESOPHAGEAL DILATION    . INGUINAL HERNIA REPAIR    . LEFT HEART CATH     Unable to Stent Blockage  . Left orchiectomy    . VASECTOMY      Family History  Problem Relation Age of Onset  . Depression Father     Suicide age 12  . Hypercholesterolemia Father   . Hypertension Father   . Hypertension Mother   . Melanoma Mother   . Hypertension Brother   . Lung disease Neg Hx  Social History   Social History  . Marital status: Married    Spouse name: N/A  . Number of children: N/A  . Years of education: N/A   Occupational History  . Disabled    Social History Main Topics  . Smoking status: Current Every Day Smoker    Packs/day: 1.00    Years: 53.00    Types: Cigarettes    Start date: 08/23/1963  . Smokeless tobacco: Never Used     Comment: Peak rate of 2ppd  . Alcohol use No  . Drug use: No  . Sexual activity: Not Asked   Other Topics Concern  . None   Social History Narrative   Disabled - mainly Architect    Married - third marriage   Tobacco Use - Yes.    1 child       [facility] (02/12/16):   Originally from [place]. He has lived in [place] most of his life. Previously has worked as a Dealer and also in Architect. Unsure of asbestos exposure. No mold exposure. Currently has an outside cat. No bird exposure. Denies any travel.          Objective:   Physical Exam Pulse (!) 53   SpO2 96%  General:  Awake. Alert. No acute distress. Thin, Caucasian male. Integument:  Warm & dry. No rash on exposed skin. No bruising on exposed skin. Extremities:  No cyanosis or clubbing.  Lymphatics:  No appreciated cervical or supraclavicular lymphadenoapthy. HEENT:  Moist mucus membranes. No oral ulcers. No scleral injection or icterus. Poor dentition.  Cardiovascular:  Regular rate. No edema. No appreciable JVD.  Pulmonary:  Good aeration & clear to auscultation bilaterally. Symmetric chest wall expansion. No accessory muscle use. Abdomen: Soft. Normal bowel sounds. Nondistended. Grossly nontender. Musculoskeletal:  Normal bulk and tone. Hand grip strength 5/5 bilaterally. No joint deformity or effusion appreciated. Neurological:  CN 2-12 grossly in tact. No meningismus. Moving all 4 extremities equally. Symmetric brachioradialis deep tendon reflexes. Psychiatric:  Mood and affect congruent. Speech normal rhythm, rate & tone.   IMAGING CT CHEST LD W/O 01/21/16 (personally reviewed by me):  No pleural effusion or thickening. No pericardial effusion. No pathologic mediastinal adenopathy. Bilateral lung nodules with partial groundglass component that her centrilobular and most prominent within the mid lung zones. Nodules at times appear cavitary. Largest nodule measures approximately 12 mm in the right lower lobe. Mild centrilobular & paraseptal emphysema with diffuse bronchial wall thickening that is apical predominant. There is also some subpleural bleb formation in bilateral apices.    Assessment & Plan:  66 y.o. male with long-standing history of tobacco use. With his  prior "bronchitis" around the time of his CT imaging I do question whether or not an infectious process may be causing more were seeing regarding his nodularity. Alternatively, he could be having some underlying smoking-related lung disease changes. Certainly the eczema on his CT imaging could be consistent with tobacco use but he will need to be screened for alpha-1 antitrypsin deficiency. I did spend over 3 minutes counseling the patient and the need for complete tobacco cessation to prevent worsening of his underlying lung function. I also cautioned him against the potential for chest discomfort and worsening/new angina with excess of negative exposure and has known underlying coronary artery disease. I instructed the patient contact my office if he had any new breathing problems or questions before his next appointment.   1. Multiple Lung Nodules: Plan for repeat low dose chest CT imaging without  contrast in 4 weeks. Holding off on further serum workup or biopsy at this time. 2. Emphysema: Screening for alpha-1 antitrypsin deficiency. Checking full pulmonary function testing before next appointment. 3. Tobacco Use Disorder: Spent over 3 minutes counseling the patient on the need for complete tobacco cessation. Recommended nicotine replacement with patches and gum for intermittent cravings. Patient constant against potential worsening/new angina with coronary artery disease. 4. Dysphagia/GERD: Currently on Prilosec. Plan to discuss further at his follow-up appointment and consider esophagram for imaging. 5. Follow-up: Return to clinic in 6 weeks or sooner if needed.  Sonia Baller Ashok Cordia, M.D. [facility] Pager:  [phone] After 3pm or if no response, call [phone] 10:58 AM 02/12/16

## 2016-02-12 NOTE — Telephone Encounter (Signed)
IMAGING CT CHEST LD W/O 01/21/16 (personally reviewed by me):  No pleural effusion or thickening. No pericardial effusion. No pathologic mediastinal adenopathy. Bilateral lung nodules with partial groundglass component that her centrilobular and most prominent within the mid lung zones. Nodules at times appear cavitary. Largest nodule measures approximately 12 mm in the right lower lobe. Mild centrilobular & paraseptal emphysema with diffuse bronchial wall thickening that is apical predominant. There is also some subpleural bleb formation in bilateral apices.

## 2016-02-17 LAB — ALPHA-1 ANTITRYPSIN PHENOTYPE: A-1 Antitrypsin: 187 mg/dL (ref 83–199)

## 2016-02-29 ENCOUNTER — Telehealth: Payer: Self-pay | Admitting: Cardiology

## 2016-02-29 NOTE — Telephone Encounter (Signed)
Numerous attempts to contact patient with recall letters. Unable to reach by telephone. with no success.    User: Time: StatusChanda Lowery [id number] 11/05/2014 12:00 PM New [10]   [System] 08/08/2015 11:00 PM Notification Sent [20]   Kyle Lowery [id number] 10/08/2015 1:58 PM Notification Sent [20]   Kyle Lowery [id number] 10/15/2015 4:33 PM Notification Sent [20]   Kyle Lowery [id number] 01/28/2016 10:08 AM Notification Sent [20]   Kyle Lowery [id number] 01/28/2016 10:11 AM Notification Sent [20

## 2016-03-11 ENCOUNTER — Ambulatory Visit (HOSPITAL_COMMUNITY): Admission: RE | Admit: 2016-03-11 | Payer: Medicare Other | Source: Ambulatory Visit

## 2016-03-11 ENCOUNTER — Ambulatory Visit (HOSPITAL_COMMUNITY)
Admission: RE | Admit: 2016-03-11 | Discharge: 2016-03-11 | Disposition: A | Discharge status: Home / Self Care | Payer: BLUE CROSS/BLUE SHIELD | Source: Ambulatory Visit | Attending: Pulmonary Disease | Admitting: Pulmonary Disease

## 2016-03-11 DIAGNOSIS — J439 Emphysema, unspecified: Secondary | ICD-10-CM | POA: Diagnosis not present

## 2016-03-11 DIAGNOSIS — R918 Other nonspecific abnormal finding of lung field: Secondary | ICD-10-CM | POA: Insufficient documentation

## 2016-03-11 DIAGNOSIS — F17200 Nicotine dependence, unspecified, uncomplicated: Secondary | ICD-10-CM | POA: Diagnosis not present

## 2016-03-11 LAB — PULMONARY FUNCTION TEST
DL/VA % pred: 53 %
DL/VA: 2.32 ml/min/mmHg/L
DLCO unc % pred: 50 %
DLCO unc: 14.08 ml/min/mmHg
FEF 25-75 Post: 1.84 L/sec
FEF 25-75 Pre: 1.12 L/sec
FEF2575-%Change-Post: 63 %
FEF2575-%Pred-Post: 75 %
FEF2575-%Pred-Pre: 46 %
FEV1-%Change-Post: 21 %
FEV1-%Pred-Post: 73 %
FEV1-%Pred-Pre: 60 %
FEV1-POST: 2.22 L
FEV1-PRE: 1.82 L
FEV1FVC-%Change-Post: -3 %
FEV1FVC-%Pred-Pre: 85 %
FEV6-%Change-Post: 19 %
FEV6-%PRED-PRE: 73 %
FEV6-%Pred-Post: 88 %
FEV6-POST: 3.39 L
FEV6-Pre: 2.83 L
FEV6FVC-%CHANGE-POST: -4 %
FEV6FVC-%PRED-POST: 99 %
FEV6FVC-%Pred-Pre: 104 %
FVC-%Change-Post: 26 %
FVC-%Pred-Post: 88 %
FVC-%Pred-Pre: 70 %
FVC-PRE: 2.85 L
FVC-Post: 3.6 L
POST FEV6/FVC RATIO: 94 %
PRE FEV6/FVC RATIO: 99 %
Post FEV1/FVC ratio: 62 %
Pre FEV1/FVC ratio: 64 %
RV % PRED: 241 %
RV: 5.18 L
TLC % PRED: 131 %
TLC: 8.3 L

## 2016-03-11 MED ORDER — ALBUTEROL SULFATE (2.5 MG/3ML) 0.083% IN NEBU
2.5000 mg | INHALATION_SOLUTION | Freq: Once | RESPIRATORY_TRACT | Status: AC
  Administered 2016-03-11: 2.5 mg via RESPIRATORY_TRACT

## 2016-03-20 ENCOUNTER — Telehealth: Payer: Self-pay | Admitting: Pulmonary Disease

## 2016-03-20 NOTE — Telephone Encounter (Signed)
PFT 03/11/16: FVC 2.85 L (70%) FEV1 1.82 L (60%) FEV1/FVC 0.64 FEF 25-55 1.12 L per this 46%) positive bronchodilator response TLC 8.30 L (131%) RV 241% ERV 87% DLCO uncorrected 50%  LABS 02/12/16 Alpha-1 antitrypsin: MM (187)

## 2016-03-22 ENCOUNTER — Encounter: Payer: Self-pay | Admitting: Pulmonary Disease

## 2016-03-22 ENCOUNTER — Ambulatory Visit (INDEPENDENT_AMBULATORY_CARE_PROVIDER_SITE_OTHER): Payer: BLUE CROSS/BLUE SHIELD | Admitting: Pulmonary Disease

## 2016-03-22 VITALS — BP 150/64 | HR 57 | Ht 68.0 in | Wt 122.2 lb

## 2016-03-22 DIAGNOSIS — R918 Other nonspecific abnormal finding of lung field: Secondary | ICD-10-CM

## 2016-03-22 DIAGNOSIS — K219 Gastro-esophageal reflux disease without esophagitis: Secondary | ICD-10-CM

## 2016-03-22 DIAGNOSIS — F17200 Nicotine dependence, unspecified, uncomplicated: Secondary | ICD-10-CM | POA: Diagnosis not present

## 2016-03-22 DIAGNOSIS — R1319 Other dysphagia: Secondary | ICD-10-CM

## 2016-03-22 DIAGNOSIS — Z23 Encounter for immunization: Secondary | ICD-10-CM

## 2016-03-22 DIAGNOSIS — J449 Chronic obstructive pulmonary disease, unspecified: Secondary | ICD-10-CM

## 2016-03-22 MED ORDER — PREDNISONE 20 MG PO TABS: 40.0000 mg | ORAL_TABLET | Freq: Every day | ORAL | 0 refills | Status: DC

## 2016-03-22 MED ORDER — BUDESONIDE-FORMOTEROL FUMARATE 160-4.5 MCG/ACT IN AERO: 2.0000 | INHALATION_SPRAY | Freq: Two times a day (BID) | RESPIRATORY_TRACT | 0 refills | Status: DC

## 2016-03-22 NOTE — Progress Notes (Signed)
Subjective:    Patient ID: Kyle Lowery, male    DOB: May 22, 1950, 66 y.o.   MRN: [id number]  C.C.:  Follow-up for Moderate COPD w/ Emphysema, Multiple Lung Nodules, Tobacco Use Disorder, & GERD w/ Dysphagia.   HPI Moderate COPD w/ Emphysema:  He reports his breathing did seem to improve with the bronchodilator challenge during his PFTs. He does have intermittent. He reports his cough produces a minimal amount of mucus. He doe wake up at night coughing & wheezing at times. Previously was on Spiriva but unable to afford it. Neither he nor his wife felt it helped significantly.   Multiple Lung Nodules:  Seen on low dose chest CT in December 2017 but patient was told not to get his repeat CT.   Tobacco Use Disorder:  He continues to smoke cigarettes. He is using 1ppd. He hasn't yet tried nicotine replacement.   GERD w/ Dysphagia:  He reports continued intermittent reflux & dyspepsia. He is continuing to have dysphagia and intemrittent odynophagia.   Review of Systems The patient reports he has had increased sinus congestion & drainage lately. He has also had some low-grade fever. No chills or sweats. No chest pain or tightness.  No Known Allergies  Current Outpatient Prescriptions on File Prior to Visit  Medication Sig Dispense Refill  . albuterol (PROVENTIL HFA;VENTOLIN HFA) 108 (90 BASE) MCG/ACT inhaler Inhale 2 puffs into the lungs every 6 (six) hours as needed for wheezing or shortness of breath.    Marland Kitchen aspirin 81 MG tablet Take 81 mg by mouth daily.      Marland Kitchen lisinopril-hydrochlorothiazide (PRINZIDE,ZESTORETIC) 20-12.5 MG per tablet Take 0.5 tablets by mouth daily. 45 tablet 3  . metoprolol succinate (TOPROL-XL) 25 MG 24 hr tablet Take 25 mg by mouth daily.    . nitroGLYCERIN (NITROSTAT) 0.4 MG SL tablet Place 1 tablet (0.4 mg total) under the tongue every 5 (five) minutes x 3 doses as needed. 25 tablet 3  . omeprazole (PRILOSEC) 20 MG capsule Take 20 mg by mouth daily.    Marland Kitchen PARoxetine  (PAXIL) 20 MG tablet Take 20 mg by mouth daily.    . pravastatin (PRAVACHOL) 20 MG tablet Take 20 mg by mouth daily.     No current facility-administered medications on file prior to visit.     Past Medical History:  Diagnosis Date  . Carotid artery stenosis   . COPD (chronic obstructive pulmonary disease) ([place])   . Coronary atherosclerosis of native coronary artery    Mild to moderate NOCAD with 80% ostial diagonal 1/12  . Depression    Prior suicide attempt  . Essential hypertension, benign   . GERD (gastroesophageal reflux disease)    Esophageal dilatation  . Mixed hyperlipidemia   . Syncope    Neurocardiogenic    Past Surgical History:  Procedure Laterality Date  . ESOPHAGOGASTRODUODENOSCOPY (EGD) WITH ESOPHAGEAL DILATION    . INGUINAL HERNIA REPAIR    . LEFT HEART CATH     Unable to Stent Blockage  . Left orchiectomy    . VASECTOMY      Family History  Problem Relation Age of Onset  . Depression Father     Suicide age 29  . Hypercholesterolemia Father   . Hypertension Father   . Hypertension Mother   . Melanoma Mother   . Hypertension Brother   . Lung disease Neg Hx     Social History   Social History  . Marital status: Married    Spouse name:  N/A  . Number of children: N/A  . Years of education: N/A   Occupational History  . Disabled    Social History Main Topics  . Smoking status: Current Every Day Smoker    Packs/day: 1.00    Years: 53.00    Types: Cigarettes    Start date: 08/23/1963  . Smokeless tobacco: Never Used     Comment: Peak rate of 2ppd  . Alcohol use No  . Drug use: No  . Sexual activity: Not Asked   Other Topics Concern  . None   Social History Narrative   Disabled - mainly Architect    Married - third marriage   Tobacco Use - Yes.    1 child      [facility] (02/12/16):   Originally from [place]. He has lived in [place] most of his life. Previously has worked as a Dealer and also in Architect. Unsure of asbestos  exposure. No mold exposure. Currently has an outside cat. No bird exposure. Denies any travel.          Objective:   Physical Exam BP (!) 150/64 (BP Location: Right Arm, Patient Position: Sitting, Cuff Size: Normal)   Pulse (!) 57   Ht 5\' 8"  (1.727 m)   Wt 122 lb 3.2 oz (55.4 kg)   SpO2 99%   BMI 18.58 kg/m   General:  Awake. Alert. Thin, Caucasian male. Integument:  Warm & dry. No rash on exposed skin.  Extremities:  No cyanosis or clubbing.  HEENT:  Moist mucus membranes. No oral ulcers. Poor dentition. Cardiovascular:  Regular rate and rhythm. No edema. No appreciable JVD.  Pulmonary:  Good aeration bilaterally. Patient does have expiratory wheezing in the mid and upper lung zones bilaterally. No accessory muscle use with normal work of breathing on room air. Abdomen: Soft. Normal bowel sounds. Nondistended.  Musculoskeletal:  Normal bulk and tone. No joint deformity or effusion appreciated.  PFT 03/11/16: FVC 2.85 L (70%) FEV1 1.82 L (60%) FEV1/FVC 0.64 FEF 25-55 1.12 L (46%) positive bronchodilator response TLC 8.30 L (131%) RV 241% ERV 87% DLCO uncorrected 50%  IMAGING CT CHEST LD W/O 01/21/16 (previously reviewed by me):  No pleural effusion or thickening. No pericardial effusion. No pathologic mediastinal adenopathy. Bilateral lung nodules with partial groundglass component that her centrilobular and most prominent within the mid lung zones. Nodules at times appear cavitary. Largest nodule measures approximately 12 mm in the right lower lobe. Mild centrilobular & paraseptal emphysema with diffuse bronchial wall thickening that is apical predominant. There is also some subpleural bleb formation in bilateral apices.  LABS 02/12/16 Alpha-1 antitrypsin: MM (187)    Assessment & Plan:  66 y.o. male with moderate COPD with emphysema by my review of his pulmonary function testing. Patient does have a significant bronchodilator response and I believe would benefit from inhaled  bronchodilator therapy. With lack of symptomatic response on Spiriva previously he may require inhaled corticosteroid therapy. I reviewed his serum test for alpha-1 antitrypsin which was normal. We did spend over 3 minutes discussing his need for tobacco cessation to prevent worsening of his lung function any further. It's unclear to me who discontinued his previous chest CT scan but this will be reordered to reassess his multiple lung nodules some of which were cavitary. With his ongoing dysphagia I do question whether or not he could have an esophageal stricture or possible mass with his chronic tobacco use and history of stricture with requirement of diet aeration. I instructed the  patient contact my office if he had any new breathing problems or questions before his next appointment.   1. Moderate COPD w/ Emphysema:  Patient with mild exacerbation today. Prescribing prednisone 40 mg by mouth daily 4 days. Starting the patient on Symbicort 160/4.5 sample. Patient to contact my office for a prescription and papers for drug assistance if this helps his symptoms significantly. 2. Multiple Lung Nodules: Repeat low dose CT chest without contrast now. Holding on further serum workup and biopsy.  3. Tobacco Use Disorder:   Again counseled the patient for over 3 minutes on the need for complete tobacco cessation. Again recommended nicotine replacement with 14 mg patches and nicotine gum for intermittent cravings. 4. Dysphagia/GERD: Continuing Prilosec. Checking esophagogram/barium swallow. 5. Health Maintenance:  Administering Prevnar 13 Vaccine today. Reports previously had Pneumovax. Had Influenza Vaccine in November 2017. 6. Follow-up: Return to clinic in 2 months or sooner if needed.  Sonia Baller Ashok Cordia, M.D. [facility] Pager:  [phone] After 3pm or if no response, call [phone] 5:22 PM 03/22/16

## 2016-03-22 NOTE — Addendum Note (Signed)
Addended by: Collier Salina on: 03/22/2016 06:00 PM   Modules accepted: Orders

## 2016-03-22 NOTE — Progress Notes (Signed)
Patient seen in the office today and instructed on use of Symbicort 160.  Patient expressed understanding and demonstrated technique. 

## 2016-03-22 NOTE — Patient Instructions (Addendum)
   Try using the Symbicort inhaler we are giving you today. Inhale 2 puffs twice daily to see if it helps. Remember to rinse, gargle, brush your teeth, brush your tongue & spit afterward to keep from getting thrush.  If the Symbicort seems to help let me know and we will get you some prescription assistance paperwork to decrease the cost of the inhaler for you.  You can continue to use your Albuterol inhaler as needed.  Let me know if your cough doesn't get better after the Prednisone.  I will see you back in 2 months or sooner if needed.  TESTS ORDERED: 1. Esophagram 2. Low Dose CT Chest w/o ASAP

## 2016-03-22 NOTE — Addendum Note (Signed)
Addended by: Tyson Dense on: 03/22/2016 06:01 PM   Modules accepted: Orders

## 2016-03-23 NOTE — Addendum Note (Signed)
Addended by: Len Blalock on: 03/23/2016 08:55 AM   Modules accepted: Orders

## 2016-03-23 NOTE — Addendum Note (Signed)
Addended by: Tyson Dense on: 03/23/2016 09:16 AM   Modules accepted: Orders

## 2016-03-25 ENCOUNTER — Ambulatory Visit (HOSPITAL_COMMUNITY)
Admission: RE | Admit: 2016-03-25 | Discharge: 2016-03-25 | Disposition: A | Discharge status: Home / Self Care | Payer: Medicare Other | Source: Ambulatory Visit | Attending: Pulmonary Disease | Admitting: Pulmonary Disease

## 2016-03-25 DIAGNOSIS — I700 Atherosclerosis of aorta: Secondary | ICD-10-CM | POA: Diagnosis not present

## 2016-03-25 DIAGNOSIS — R918 Other nonspecific abnormal finding of lung field: Secondary | ICD-10-CM | POA: Diagnosis not present

## 2016-03-25 DIAGNOSIS — I2510 Atherosclerotic heart disease of native coronary artery without angina pectoris: Secondary | ICD-10-CM | POA: Diagnosis not present

## 2016-03-25 DIAGNOSIS — J439 Emphysema, unspecified: Secondary | ICD-10-CM | POA: Diagnosis not present

## 2016-03-28 ENCOUNTER — Ambulatory Visit (HOSPITAL_COMMUNITY)
Admission: RE | Admit: 2016-03-28 | Discharge: 2016-03-28 | Disposition: A | Discharge status: Home / Self Care | Payer: BLUE CROSS/BLUE SHIELD | Source: Ambulatory Visit | Attending: Pulmonary Disease | Admitting: Pulmonary Disease

## 2016-03-28 DIAGNOSIS — R1319 Other dysphagia: Secondary | ICD-10-CM

## 2016-03-28 DIAGNOSIS — K224 Dyskinesia of esophagus: Secondary | ICD-10-CM | POA: Insufficient documentation

## 2016-04-12 ENCOUNTER — Other Ambulatory Visit: Payer: Self-pay

## 2016-04-12 DIAGNOSIS — R918 Other nonspecific abnormal finding of lung field: Secondary | ICD-10-CM

## 2016-05-17 DIAGNOSIS — F17200 Nicotine dependence, unspecified, uncomplicated: Secondary | ICD-10-CM | POA: Diagnosis not present

## 2016-05-17 DIAGNOSIS — I10 Essential (primary) hypertension: Secondary | ICD-10-CM | POA: Diagnosis not present

## 2016-05-17 DIAGNOSIS — J449 Chronic obstructive pulmonary disease, unspecified: Secondary | ICD-10-CM | POA: Diagnosis not present

## 2016-05-17 DIAGNOSIS — F17210 Nicotine dependence, cigarettes, uncomplicated: Secondary | ICD-10-CM | POA: Diagnosis not present

## 2016-05-17 DIAGNOSIS — F329 Major depressive disorder, single episode, unspecified: Secondary | ICD-10-CM | POA: Diagnosis not present

## 2016-05-30 ENCOUNTER — Ambulatory Visit: Payer: BLUE CROSS/BLUE SHIELD | Admitting: Pulmonary Disease

## 2016-08-23 DIAGNOSIS — J449 Chronic obstructive pulmonary disease, unspecified: Secondary | ICD-10-CM | POA: Diagnosis not present

## 2016-08-23 DIAGNOSIS — F329 Major depressive disorder, single episode, unspecified: Secondary | ICD-10-CM | POA: Diagnosis not present

## 2016-08-23 DIAGNOSIS — I10 Essential (primary) hypertension: Secondary | ICD-10-CM | POA: Diagnosis not present

## 2016-08-23 DIAGNOSIS — I2510 Atherosclerotic heart disease of native coronary artery without angina pectoris: Secondary | ICD-10-CM | POA: Diagnosis not present

## 2016-11-24 DIAGNOSIS — F17210 Nicotine dependence, cigarettes, uncomplicated: Secondary | ICD-10-CM | POA: Diagnosis not present

## 2016-11-24 DIAGNOSIS — I10 Essential (primary) hypertension: Secondary | ICD-10-CM | POA: Diagnosis not present

## 2016-11-24 DIAGNOSIS — F17200 Nicotine dependence, unspecified, uncomplicated: Secondary | ICD-10-CM | POA: Diagnosis not present

## 2016-11-24 DIAGNOSIS — J441 Chronic obstructive pulmonary disease with (acute) exacerbation: Secondary | ICD-10-CM | POA: Diagnosis not present

## 2016-11-24 DIAGNOSIS — F339 Major depressive disorder, recurrent, unspecified: Secondary | ICD-10-CM | POA: Diagnosis not present

## 2017-02-23 DIAGNOSIS — I2510 Atherosclerotic heart disease of native coronary artery without angina pectoris: Secondary | ICD-10-CM | POA: Diagnosis not present

## 2017-02-23 DIAGNOSIS — I10 Essential (primary) hypertension: Secondary | ICD-10-CM | POA: Diagnosis not present

## 2017-02-23 DIAGNOSIS — F17200 Nicotine dependence, unspecified, uncomplicated: Secondary | ICD-10-CM | POA: Diagnosis not present

## 2017-02-23 DIAGNOSIS — Z23 Encounter for immunization: Secondary | ICD-10-CM | POA: Diagnosis not present

## 2017-02-23 DIAGNOSIS — F329 Major depressive disorder, single episode, unspecified: Secondary | ICD-10-CM | POA: Diagnosis not present

## 2017-02-23 DIAGNOSIS — Z0000 Encounter for general adult medical examination without abnormal findings: Secondary | ICD-10-CM | POA: Diagnosis not present

## 2017-02-23 DIAGNOSIS — J449 Chronic obstructive pulmonary disease, unspecified: Secondary | ICD-10-CM | POA: Diagnosis not present

## 2017-05-25 DIAGNOSIS — I2510 Atherosclerotic heart disease of native coronary artery without angina pectoris: Secondary | ICD-10-CM | POA: Diagnosis not present

## 2017-05-25 DIAGNOSIS — F17200 Nicotine dependence, unspecified, uncomplicated: Secondary | ICD-10-CM | POA: Diagnosis not present

## 2017-05-25 DIAGNOSIS — J441 Chronic obstructive pulmonary disease with (acute) exacerbation: Secondary | ICD-10-CM | POA: Diagnosis not present

## 2017-09-04 DIAGNOSIS — I2510 Atherosclerotic heart disease of native coronary artery without angina pectoris: Secondary | ICD-10-CM | POA: Diagnosis not present

## 2017-09-04 DIAGNOSIS — I10 Essential (primary) hypertension: Secondary | ICD-10-CM | POA: Diagnosis not present

## 2017-09-04 DIAGNOSIS — J449 Chronic obstructive pulmonary disease, unspecified: Secondary | ICD-10-CM | POA: Diagnosis not present

## 2017-09-04 DIAGNOSIS — F339 Major depressive disorder, recurrent, unspecified: Secondary | ICD-10-CM | POA: Diagnosis not present

## 2018-01-02 DIAGNOSIS — I10 Essential (primary) hypertension: Secondary | ICD-10-CM | POA: Diagnosis not present

## 2018-01-02 DIAGNOSIS — I2510 Atherosclerotic heart disease of native coronary artery without angina pectoris: Secondary | ICD-10-CM | POA: Diagnosis not present

## 2018-01-02 DIAGNOSIS — J449 Chronic obstructive pulmonary disease, unspecified: Secondary | ICD-10-CM | POA: Diagnosis not present

## 2018-01-02 DIAGNOSIS — Z23 Encounter for immunization: Secondary | ICD-10-CM | POA: Diagnosis not present

## 2018-01-02 DIAGNOSIS — F17210 Nicotine dependence, cigarettes, uncomplicated: Secondary | ICD-10-CM | POA: Diagnosis not present

## 2018-01-02 DIAGNOSIS — F339 Major depressive disorder, recurrent, unspecified: Secondary | ICD-10-CM | POA: Diagnosis not present

## 2018-04-03 DIAGNOSIS — I2510 Atherosclerotic heart disease of native coronary artery without angina pectoris: Secondary | ICD-10-CM | POA: Diagnosis not present

## 2018-04-03 DIAGNOSIS — I10 Essential (primary) hypertension: Secondary | ICD-10-CM | POA: Diagnosis not present

## 2018-04-03 DIAGNOSIS — F17200 Nicotine dependence, unspecified, uncomplicated: Secondary | ICD-10-CM | POA: Diagnosis not present

## 2018-04-03 DIAGNOSIS — J441 Chronic obstructive pulmonary disease with (acute) exacerbation: Secondary | ICD-10-CM | POA: Diagnosis not present

## 2018-04-17 DIAGNOSIS — I2510 Atherosclerotic heart disease of native coronary artery without angina pectoris: Secondary | ICD-10-CM | POA: Diagnosis not present

## 2018-04-17 DIAGNOSIS — E876 Hypokalemia: Secondary | ICD-10-CM | POA: Diagnosis not present

## 2018-04-17 DIAGNOSIS — J101 Influenza due to other identified influenza virus with other respiratory manifestations: Secondary | ICD-10-CM | POA: Diagnosis not present

## 2018-04-17 DIAGNOSIS — F329 Major depressive disorder, single episode, unspecified: Secondary | ICD-10-CM | POA: Diagnosis not present

## 2018-04-17 DIAGNOSIS — K219 Gastro-esophageal reflux disease without esophagitis: Secondary | ICD-10-CM | POA: Diagnosis not present

## 2018-04-17 DIAGNOSIS — F17200 Nicotine dependence, unspecified, uncomplicated: Secondary | ICD-10-CM | POA: Diagnosis not present

## 2018-04-17 DIAGNOSIS — E871 Hypo-osmolality and hyponatremia: Secondary | ICD-10-CM | POA: Diagnosis not present

## 2018-04-17 DIAGNOSIS — I10 Essential (primary) hypertension: Secondary | ICD-10-CM | POA: Diagnosis not present

## 2018-04-17 DIAGNOSIS — E878 Other disorders of electrolyte and fluid balance, not elsewhere classified: Secondary | ICD-10-CM | POA: Diagnosis not present

## 2018-04-17 DIAGNOSIS — R0902 Hypoxemia: Secondary | ICD-10-CM | POA: Diagnosis not present

## 2018-04-17 DIAGNOSIS — R112 Nausea with vomiting, unspecified: Secondary | ICD-10-CM | POA: Diagnosis not present

## 2018-04-17 DIAGNOSIS — J209 Acute bronchitis, unspecified: Secondary | ICD-10-CM | POA: Diagnosis not present

## 2018-04-17 DIAGNOSIS — E785 Hyperlipidemia, unspecified: Secondary | ICD-10-CM | POA: Diagnosis not present

## 2018-04-17 DIAGNOSIS — J111 Influenza due to unidentified influenza virus with other respiratory manifestations: Secondary | ICD-10-CM | POA: Diagnosis not present

## 2018-04-22 ENCOUNTER — Inpatient Hospital Stay (HOSPITAL_COMMUNITY): Payer: BLUE CROSS/BLUE SHIELD

## 2018-04-22 ENCOUNTER — Encounter (HOSPITAL_COMMUNITY): Payer: Self-pay | Admitting: Emergency Medicine

## 2018-04-22 ENCOUNTER — Emergency Department (HOSPITAL_COMMUNITY): Payer: BLUE CROSS/BLUE SHIELD

## 2018-04-22 ENCOUNTER — Other Ambulatory Visit: Payer: Self-pay

## 2018-04-22 ENCOUNTER — Inpatient Hospital Stay (HOSPITAL_COMMUNITY)
Admission: EM | Admit: 2018-04-22 | Discharge: 2018-05-09 | DRG: 004 | Disposition: A | Discharge status: Other Acute Inpatient Hospital | Payer: BLUE CROSS/BLUE SHIELD | Attending: Pulmonary Disease | Admitting: Pulmonary Disease

## 2018-04-22 DIAGNOSIS — R451 Restlessness and agitation: Secondary | ICD-10-CM | POA: Diagnosis not present

## 2018-04-22 DIAGNOSIS — Z4682 Encounter for fitting and adjustment of non-vascular catheter: Secondary | ICD-10-CM | POA: Diagnosis not present

## 2018-04-22 DIAGNOSIS — J9621 Acute and chronic respiratory failure with hypoxia: Secondary | ICD-10-CM | POA: Diagnosis present

## 2018-04-22 DIAGNOSIS — J189 Pneumonia, unspecified organism: Secondary | ICD-10-CM | POA: Diagnosis not present

## 2018-04-22 DIAGNOSIS — I480 Paroxysmal atrial fibrillation: Secondary | ICD-10-CM | POA: Diagnosis not present

## 2018-04-22 DIAGNOSIS — J1008 Influenza due to other identified influenza virus with other specified pneumonia: Secondary | ICD-10-CM | POA: Diagnosis not present

## 2018-04-22 DIAGNOSIS — I4892 Unspecified atrial flutter: Secondary | ICD-10-CM | POA: Diagnosis not present

## 2018-04-22 DIAGNOSIS — Z808 Family history of malignant neoplasm of other organs or systems: Secondary | ICD-10-CM

## 2018-04-22 DIAGNOSIS — Z931 Gastrostomy status: Secondary | ICD-10-CM | POA: Diagnosis not present

## 2018-04-22 DIAGNOSIS — J9622 Acute and chronic respiratory failure with hypercapnia: Secondary | ICD-10-CM | POA: Diagnosis not present

## 2018-04-22 DIAGNOSIS — E876 Hypokalemia: Secondary | ICD-10-CM | POA: Diagnosis present

## 2018-04-22 DIAGNOSIS — J9602 Acute respiratory failure with hypercapnia: Secondary | ICD-10-CM | POA: Diagnosis not present

## 2018-04-22 DIAGNOSIS — I10 Essential (primary) hypertension: Secondary | ICD-10-CM | POA: Diagnosis present

## 2018-04-22 DIAGNOSIS — J14 Pneumonia due to Hemophilus influenzae: Secondary | ICD-10-CM | POA: Diagnosis present

## 2018-04-22 DIAGNOSIS — J9690 Respiratory failure, unspecified, unspecified whether with hypoxia or hypercapnia: Secondary | ICD-10-CM

## 2018-04-22 DIAGNOSIS — R197 Diarrhea, unspecified: Secondary | ICD-10-CM | POA: Diagnosis present

## 2018-04-22 DIAGNOSIS — E782 Mixed hyperlipidemia: Secondary | ICD-10-CM | POA: Diagnosis present

## 2018-04-22 DIAGNOSIS — J151 Pneumonia due to Pseudomonas: Secondary | ICD-10-CM | POA: Diagnosis not present

## 2018-04-22 DIAGNOSIS — Z781 Physical restraint status: Secondary | ICD-10-CM

## 2018-04-22 DIAGNOSIS — R1110 Vomiting, unspecified: Secondary | ICD-10-CM | POA: Diagnosis present

## 2018-04-22 DIAGNOSIS — R531 Weakness: Secondary | ICD-10-CM | POA: Diagnosis not present

## 2018-04-22 DIAGNOSIS — J111 Influenza due to unidentified influenza virus with other respiratory manifestations: Secondary | ICD-10-CM | POA: Diagnosis not present

## 2018-04-22 DIAGNOSIS — J9600 Acute respiratory failure, unspecified whether with hypoxia or hypercapnia: Secondary | ICD-10-CM | POA: Diagnosis not present

## 2018-04-22 DIAGNOSIS — F329 Major depressive disorder, single episode, unspecified: Secondary | ICD-10-CM | POA: Diagnosis present

## 2018-04-22 DIAGNOSIS — E870 Hyperosmolality and hypernatremia: Secondary | ICD-10-CM | POA: Diagnosis not present

## 2018-04-22 DIAGNOSIS — Z915 Personal history of self-harm: Secondary | ICD-10-CM

## 2018-04-22 DIAGNOSIS — J441 Chronic obstructive pulmonary disease with (acute) exacerbation: Secondary | ICD-10-CM | POA: Diagnosis present

## 2018-04-22 DIAGNOSIS — Z978 Presence of other specified devices: Secondary | ICD-10-CM

## 2018-04-22 DIAGNOSIS — I2510 Atherosclerotic heart disease of native coronary artery without angina pectoris: Secondary | ICD-10-CM | POA: Diagnosis present

## 2018-04-22 DIAGNOSIS — R633 Feeding difficulties, unspecified: Secondary | ICD-10-CM

## 2018-04-22 DIAGNOSIS — T4275XA Adverse effect of unspecified antiepileptic and sedative-hypnotic drugs, initial encounter: Secondary | ICD-10-CM | POA: Diagnosis not present

## 2018-04-22 DIAGNOSIS — D6489 Other specified anemias: Secondary | ICD-10-CM | POA: Diagnosis not present

## 2018-04-22 DIAGNOSIS — Z930 Tracheostomy status: Secondary | ICD-10-CM

## 2018-04-22 DIAGNOSIS — Y848 Other medical procedures as the cause of abnormal reaction of the patient, or of later complication, without mention of misadventure at the time of the procedure: Secondary | ICD-10-CM | POA: Diagnosis not present

## 2018-04-22 DIAGNOSIS — G9349 Other encephalopathy: Secondary | ICD-10-CM | POA: Diagnosis not present

## 2018-04-22 DIAGNOSIS — G9341 Metabolic encephalopathy: Secondary | ICD-10-CM | POA: Diagnosis not present

## 2018-04-22 DIAGNOSIS — G9340 Encephalopathy, unspecified: Secondary | ICD-10-CM | POA: Diagnosis not present

## 2018-04-22 DIAGNOSIS — F29 Unspecified psychosis not due to a substance or known physiological condition: Secondary | ICD-10-CM | POA: Diagnosis not present

## 2018-04-22 DIAGNOSIS — Z8249 Family history of ischemic heart disease and other diseases of the circulatory system: Secondary | ICD-10-CM

## 2018-04-22 DIAGNOSIS — Z9911 Dependence on respirator [ventilator] status: Secondary | ICD-10-CM

## 2018-04-22 DIAGNOSIS — E440 Moderate protein-calorie malnutrition: Secondary | ICD-10-CM | POA: Diagnosis not present

## 2018-04-22 DIAGNOSIS — Z9981 Dependence on supplemental oxygen: Secondary | ICD-10-CM

## 2018-04-22 DIAGNOSIS — Y95 Nosocomial condition: Secondary | ICD-10-CM | POA: Diagnosis present

## 2018-04-22 DIAGNOSIS — Z8349 Family history of other endocrine, nutritional and metabolic diseases: Secondary | ICD-10-CM

## 2018-04-22 DIAGNOSIS — J440 Chronic obstructive pulmonary disease with acute lower respiratory infection: Secondary | ICD-10-CM | POA: Diagnosis present

## 2018-04-22 DIAGNOSIS — R918 Other nonspecific abnormal finding of lung field: Secondary | ICD-10-CM | POA: Diagnosis not present

## 2018-04-22 DIAGNOSIS — J9601 Acute respiratory failure with hypoxia: Secondary | ICD-10-CM

## 2018-04-22 DIAGNOSIS — I952 Hypotension due to drugs: Secondary | ICD-10-CM | POA: Diagnosis not present

## 2018-04-22 DIAGNOSIS — N179 Acute kidney failure, unspecified: Secondary | ICD-10-CM | POA: Diagnosis not present

## 2018-04-22 DIAGNOSIS — I4891 Unspecified atrial fibrillation: Secondary | ICD-10-CM | POA: Diagnosis present

## 2018-04-22 DIAGNOSIS — K219 Gastro-esophageal reflux disease without esophagitis: Secondary | ICD-10-CM | POA: Diagnosis present

## 2018-04-22 DIAGNOSIS — E872 Acidosis: Secondary | ICD-10-CM | POA: Diagnosis present

## 2018-04-22 DIAGNOSIS — F17210 Nicotine dependence, cigarettes, uncomplicated: Secondary | ICD-10-CM | POA: Diagnosis present

## 2018-04-22 DIAGNOSIS — Z0189 Encounter for other specified special examinations: Secondary | ICD-10-CM

## 2018-04-22 DIAGNOSIS — J95851 Ventilator associated pneumonia: Secondary | ICD-10-CM | POA: Diagnosis not present

## 2018-04-22 DIAGNOSIS — R002 Palpitations: Secondary | ICD-10-CM | POA: Diagnosis not present

## 2018-04-22 DIAGNOSIS — R000 Tachycardia, unspecified: Secondary | ICD-10-CM | POA: Diagnosis not present

## 2018-04-22 DIAGNOSIS — J1100 Influenza due to unidentified influenza virus with unspecified type of pneumonia: Secondary | ICD-10-CM

## 2018-04-22 DIAGNOSIS — I4821 Permanent atrial fibrillation: Secondary | ICD-10-CM | POA: Diagnosis not present

## 2018-04-22 DIAGNOSIS — Z79899 Other long term (current) drug therapy: Secondary | ICD-10-CM

## 2018-04-22 DIAGNOSIS — Z4659 Encounter for fitting and adjustment of other gastrointestinal appliance and device: Secondary | ICD-10-CM

## 2018-04-22 DIAGNOSIS — R410 Disorientation, unspecified: Secondary | ICD-10-CM | POA: Diagnosis not present

## 2018-04-22 DIAGNOSIS — Z9289 Personal history of other medical treatment: Secondary | ICD-10-CM

## 2018-04-22 DIAGNOSIS — A498 Other bacterial infections of unspecified site: Secondary | ICD-10-CM | POA: Diagnosis not present

## 2018-04-22 DIAGNOSIS — D72810 Lymphocytopenia: Secondary | ICD-10-CM | POA: Diagnosis present

## 2018-04-22 DIAGNOSIS — J129 Viral pneumonia, unspecified: Secondary | ICD-10-CM | POA: Diagnosis present

## 2018-04-22 DIAGNOSIS — I6529 Occlusion and stenosis of unspecified carotid artery: Secondary | ICD-10-CM | POA: Diagnosis present

## 2018-04-22 DIAGNOSIS — I351 Nonrheumatic aortic (valve) insufficiency: Secondary | ICD-10-CM | POA: Diagnosis present

## 2018-04-22 DIAGNOSIS — R069 Unspecified abnormalities of breathing: Secondary | ICD-10-CM

## 2018-04-22 DIAGNOSIS — I248 Other forms of acute ischemic heart disease: Secondary | ICD-10-CM | POA: Diagnosis present

## 2018-04-22 DIAGNOSIS — Z818 Family history of other mental and behavioral disorders: Secondary | ICD-10-CM

## 2018-04-22 DIAGNOSIS — Z7901 Long term (current) use of anticoagulants: Secondary | ICD-10-CM | POA: Diagnosis not present

## 2018-04-22 LAB — URINALYSIS, ROUTINE W REFLEX MICROSCOPIC
Bacteria, UA: NONE SEEN
Bilirubin Urine: NEGATIVE
Glucose, UA: NEGATIVE mg/dL
Ketones, ur: 20 mg/dL — AB
Leukocytes,Ua: NEGATIVE
Nitrite: NEGATIVE
Protein, ur: NEGATIVE mg/dL
Specific Gravity, Urine: 1.008 (ref 1.005–1.030)
pH: 6 (ref 5.0–8.0)

## 2018-04-22 LAB — INFLUENZA PANEL BY PCR (TYPE A & B)
Influenza A By PCR: POSITIVE — AB
Influenza B By PCR: NEGATIVE

## 2018-04-22 LAB — COMPREHENSIVE METABOLIC PANEL
ALT: 15 U/L (ref 0–44)
AST: 21 U/L (ref 15–41)
Albumin: 2.8 g/dL — ABNORMAL LOW (ref 3.5–5.0)
Alkaline Phosphatase: 45 U/L (ref 38–126)
Anion gap: 15 (ref 5–15)
BUN: 21 mg/dL (ref 8–23)
CO2: 25 mmol/L (ref 22–32)
Calcium: 7.9 mg/dL — ABNORMAL LOW (ref 8.9–10.3)
Chloride: 95 mmol/L — ABNORMAL LOW (ref 98–111)
Creatinine, Ser: 0.87 mg/dL (ref 0.61–1.24)
GFR calc Af Amer: >60 mL/min (ref >60)
GFR calc non Af Amer: >60 mL/min (ref >60)
GLUCOSE: 113 mg/dL — AB (ref 70–99)
Potassium: 2.5 mmol/L — CL (ref 3.5–5.1)
SODIUM: 135 mmol/L (ref 135–145)
Total Bilirubin: 1.8 mg/dL — ABNORMAL HIGH (ref 0.3–1.2)
Total Protein: 6.6 g/dL (ref 6.5–8.1)

## 2018-04-22 LAB — CBC WITH DIFFERENTIAL/PLATELET
Abs Immature Granulocytes: 0.14 10*3/uL — ABNORMAL HIGH (ref 0.00–0.07)
Basophils Absolute: 0 10*3/uL (ref 0.0–0.1)
Basophils Relative: 0 %
Eosinophils Absolute: 0 10*3/uL (ref 0.0–0.5)
Eosinophils Relative: 0 %
HCT: 40.6 % (ref 39.0–52.0)
Hemoglobin: 14.1 g/dL (ref 13.0–17.0)
Immature Granulocytes: 2 %
Lymphocytes Relative: 9 %
Lymphs Abs: 0.6 10*3/uL — ABNORMAL LOW (ref 0.7–4.0)
MCH: 33.6 pg (ref 26.0–34.0)
MCHC: 34.7 g/dL (ref 30.0–36.0)
MCV: 96.7 fL (ref 80.0–100.0)
Monocytes Absolute: 0.4 10*3/uL (ref 0.1–1.0)
Monocytes Relative: 7 %
Neutro Abs: 5.2 10*3/uL (ref 1.7–7.7)
Neutrophils Relative %: 82 %
PLATELETS: 214 10*3/uL (ref 150–400)
RBC: 4.2 MIL/uL — AB (ref 4.22–5.81)
RDW: 14.3 % (ref 11.5–15.5)
WBC: 6.4 10*3/uL (ref 4.0–10.5)
nRBC: 0.3 % — ABNORMAL HIGH (ref 0.0–0.2)

## 2018-04-22 LAB — RESPIRATORY PANEL BY PCR
Adenovirus: NOT DETECTED
Bordetella pertussis: NOT DETECTED
CORONAVIRUS NL63-RVPPCR: NOT DETECTED
Chlamydophila pneumoniae: NOT DETECTED
Coronavirus 229E: NOT DETECTED
Coronavirus HKU1: NOT DETECTED
Coronavirus OC43: NOT DETECTED
Influenza A: NOT DETECTED
Influenza B: NOT DETECTED
Metapneumovirus: NOT DETECTED
Mycoplasma pneumoniae: NOT DETECTED
PARAINFLUENZA VIRUS 2-RVPPCR: NOT DETECTED
Parainfluenza Virus 1: NOT DETECTED
Parainfluenza Virus 3: NOT DETECTED
Parainfluenza Virus 4: NOT DETECTED
Respiratory Syncytial Virus: NOT DETECTED
Rhinovirus / Enterovirus: NOT DETECTED

## 2018-04-22 LAB — BLOOD GAS, ARTERIAL
Acid-base deficit: 3.6 mmol/L — ABNORMAL HIGH (ref 0.0–2.0)
Acid-base deficit: 3.6 mmol/L — ABNORMAL HIGH (ref 0.0–2.0)
Bicarbonate: 19.5 mmol/L — ABNORMAL LOW (ref 20.0–28.0)
Bicarbonate: 20.4 mmol/L (ref 20.0–28.0)
Delivery systems: POSITIVE
Drawn by: 331001
Expiratory PAP: 6
FIO2: 50
FIO2: 100
INSPIRATORY PAP: 12
Mode: POSITIVE
O2 Saturation: 95.1 %
O2 Saturation: 98.8 %
Patient temperature: 36
Patient temperature: 37
pCO2 arterial: 68.5 mmHg (ref 32.0–48.0)
pCO2 arterial: 57.1 mmHg — ABNORMAL HIGH (ref 32.0–48.0)
pH, Arterial: 7.166 — CL (ref 7.350–7.450)
pH, Arterial: 7.228 — ABNORMAL LOW (ref 7.350–7.450)
pO2, Arterial: 103 mmHg (ref 83.0–108.0)
pO2, Arterial: 320 mmHg — ABNORMAL HIGH (ref 83.0–108.0)

## 2018-04-22 LAB — LACTIC ACID, PLASMA: Lactic Acid, Venous: 5.8 mmol/L (ref 0.5–1.9)

## 2018-04-22 LAB — TROPONIN I
TROPONIN I: 0.16 ng/mL — AB (ref <0.03)
Troponin I: 0.03 ng/mL (ref <0.03)

## 2018-04-22 LAB — BRAIN NATRIURETIC PEPTIDE: B Natriuretic Peptide: 433 pg/mL — ABNORMAL HIGH (ref 0.0–100.0)

## 2018-04-22 LAB — PROCALCITONIN: PROCALCITONIN: 2.93 ng/mL

## 2018-04-22 LAB — MAGNESIUM: Magnesium: 1.6 mg/dL — ABNORMAL LOW (ref 1.7–2.4)

## 2018-04-22 MED ORDER — POTASSIUM CHLORIDE IN NACL 40-0.9 MEQ/L-% IV SOLN
INTRAVENOUS | Status: DC
  Filled 2018-04-22: qty 1000

## 2018-04-22 MED ORDER — DILTIAZEM HCL 25 MG/5ML IV SOLN
10.0000 mg | Freq: Once | INTRAVENOUS | Status: AC
  Administered 2018-04-22: 10 mg via INTRAVENOUS

## 2018-04-22 MED ORDER — DILTIAZEM HCL 100 MG IV SOLR
INTRAVENOUS | Status: AC
  Administered 2018-04-22: 5 mg/h via INTRAVENOUS
  Filled 2018-04-22: qty 100

## 2018-04-22 MED ORDER — POTASSIUM CHLORIDE CRYS ER 20 MEQ PO TBCR
40.0000 meq | EXTENDED_RELEASE_TABLET | Freq: Once | ORAL | Status: AC
  Administered 2018-04-22: 40 meq via ORAL
  Filled 2018-04-22: qty 2

## 2018-04-22 MED ORDER — LACTATED RINGERS IV BOLUS
2000.0000 mL | Freq: Once | INTRAVENOUS | Status: AC
  Administered 2018-04-22: 2000 mL via INTRAVENOUS

## 2018-04-22 MED ORDER — FENTANYL BOLUS VIA INFUSION
25.0000 ug | INTRAVENOUS | Status: DC | PRN
  Filled 2018-04-22: qty 25

## 2018-04-22 MED ORDER — SODIUM CHLORIDE 0.9 % IV SOLN
2.0000 g | Freq: Three times a day (TID) | INTRAVENOUS | Status: DC
  Administered 2018-04-22 – 2018-04-24 (×5): 2 g via INTRAVENOUS
  Filled 2018-04-22 (×6): qty 2

## 2018-04-22 MED ORDER — SODIUM CHLORIDE 0.9 % IV BOLUS: 1000.0000 mL | Freq: Once | INTRAVENOUS | Status: DC

## 2018-04-22 MED ORDER — IPRATROPIUM-ALBUTEROL 0.5-2.5 (3) MG/3ML IN SOLN: 3.0000 mL | Freq: Four times a day (QID) | RESPIRATORY_TRACT | Status: DC

## 2018-04-22 MED ORDER — MIDAZOLAM HCL 2 MG/2ML IJ SOLN
1.0000 mg | INTRAMUSCULAR | Status: AC | PRN
  Administered 2018-04-22 (×3): 1 mg via INTRAVENOUS
  Filled 2018-04-22 (×2): qty 2

## 2018-04-22 MED ORDER — FENTANYL 2500MCG IN NS 250ML (10MCG/ML) PREMIX INFUSION
25.0000 ug/h | INTRAVENOUS | Status: DC
  Administered 2018-04-22: 50 ug/h via INTRAVENOUS
  Administered 2018-04-23: 75 ug/h via INTRAVENOUS
  Filled 2018-04-22 (×2): qty 250

## 2018-04-22 MED ORDER — IPRATROPIUM BROMIDE 0.02 % IN SOLN: 0.5000 mg | RESPIRATORY_TRACT | Status: DC | PRN

## 2018-04-22 MED ORDER — IOHEXOL 350 MG/ML SOLN: 100.0000 mL | Freq: Once | INTRAVENOUS | Status: DC | PRN

## 2018-04-22 MED ORDER — LACTATED RINGERS IV BOLUS: 1000.0000 mL | Freq: Once | INTRAVENOUS | Status: DC

## 2018-04-22 MED ORDER — NITROGLYCERIN IN D5W 200-5 MCG/ML-% IV SOLN
5.0000 ug/min | INTRAVENOUS | Status: DC
  Administered 2018-04-22: 5 ug/min via INTRAVENOUS
  Filled 2018-04-22: qty 250

## 2018-04-22 MED ORDER — FAMOTIDINE IN NACL 20-0.9 MG/50ML-% IV SOLN
20.0000 mg | Freq: Two times a day (BID) | INTRAVENOUS | Status: DC
  Administered 2018-04-22: 20 mg via INTRAVENOUS
  Filled 2018-04-22 (×3): qty 50

## 2018-04-22 MED ORDER — MAGNESIUM SULFATE 2 GM/50ML IV SOLN
2.0000 g | Freq: Once | INTRAVENOUS | Status: AC
  Administered 2018-04-22: 2 g via INTRAVENOUS
  Filled 2018-04-22: qty 50

## 2018-04-22 MED ORDER — MIDAZOLAM HCL 2 MG/2ML IJ SOLN: 1.0000 mg | INTRAMUSCULAR | Status: DC | PRN

## 2018-04-22 MED ORDER — LORAZEPAM 2 MG/ML IJ SOLN
0.5000 mg | Freq: Once | INTRAMUSCULAR | Status: AC
  Administered 2018-04-22: 0.5 mg via INTRAVENOUS
  Filled 2018-04-22: qty 1

## 2018-04-22 MED ORDER — IPRATROPIUM-ALBUTEROL 0.5-2.5 (3) MG/3ML IN SOLN
3.0000 mL | Freq: Four times a day (QID) | RESPIRATORY_TRACT | Status: DC
  Administered 2018-04-22 – 2018-04-25 (×14): 3 mL via RESPIRATORY_TRACT
  Filled 2018-04-22 (×14): qty 3

## 2018-04-22 MED ORDER — FENTANYL CITRATE (PF) 100 MCG/2ML IJ SOLN: 50.0000 ug | INTRAMUSCULAR | Status: DC | PRN

## 2018-04-22 MED ORDER — VANCOMYCIN HCL IN DEXTROSE 750-5 MG/150ML-% IV SOLN
750.0000 mg | Freq: Two times a day (BID) | INTRAVENOUS | Status: DC
  Filled 2018-04-22: qty 150

## 2018-04-22 MED ORDER — POTASSIUM CHLORIDE 10 MEQ/100ML IV SOLN
10.0000 meq | Freq: Once | INTRAVENOUS | Status: AC
  Administered 2018-04-22: 10 meq via INTRAVENOUS
  Filled 2018-04-22: qty 100

## 2018-04-22 MED ORDER — MIDAZOLAM HCL 2 MG/2ML IJ SOLN
2.0000 mg | Freq: Once | INTRAMUSCULAR | Status: AC
  Administered 2018-04-22: 2 mg via INTRAVENOUS

## 2018-04-22 MED ORDER — LORAZEPAM 2 MG/ML IJ SOLN
INTRAMUSCULAR | Status: AC
  Administered 2018-04-22: 0.5 mg
  Filled 2018-04-22: qty 1

## 2018-04-22 MED ORDER — MIDAZOLAM 50MG/50ML (1MG/ML) PREMIX INFUSION
0.5000 mg/h | INTRAVENOUS | Status: DC
  Administered 2018-04-22: 0.5 mg/h via INTRAVENOUS
  Filled 2018-04-22: qty 50

## 2018-04-22 MED ORDER — DILTIAZEM HCL 100 MG IV SOLR
5.0000 mg/h | INTRAVENOUS | Status: DC
  Administered 2018-04-22: 5 mg/h via INTRAVENOUS

## 2018-04-22 MED ORDER — VANCOMYCIN HCL 1.25 G IV SOLR
1250.0000 mg | Freq: Once | INTRAVENOUS | Status: AC
  Administered 2018-04-22: 1250 mg via INTRAVENOUS
  Filled 2018-04-22: qty 1250

## 2018-04-22 MED ORDER — PROPOFOL 1000 MG/100ML IV EMUL
5.0000 ug/kg/min | INTRAVENOUS | Status: DC
  Administered 2018-04-22: 5 ug/kg/min via INTRAVENOUS

## 2018-04-22 MED ORDER — FENTANYL CITRATE (PF) 100 MCG/2ML IJ SOLN
50.0000 ug | Freq: Once | INTRAMUSCULAR | Status: AC
  Administered 2018-04-22: 50 ug via INTRAVENOUS

## 2018-04-22 MED ORDER — FENTANYL CITRATE (PF) 100 MCG/2ML IJ SOLN
50.0000 ug | INTRAMUSCULAR | Status: DC | PRN
  Administered 2018-04-22 (×2): 50 ug via INTRAVENOUS
  Filled 2018-04-22 (×2): qty 2

## 2018-04-22 MED ORDER — SODIUM CHLORIDE 0.9 % IV BOLUS (SEPSIS)
500.0000 mL | Freq: Once | INTRAVENOUS | Status: AC
  Administered 2018-04-22: 500 mL via INTRAVENOUS

## 2018-04-22 MED ORDER — PROPOFOL 1000 MG/100ML IV EMUL
INTRAVENOUS | Status: AC
  Filled 2018-04-22: qty 100

## 2018-04-22 MED ORDER — DILTIAZEM HCL 25 MG/5ML IV SOLN: 10.0000 mg | Freq: Once | INTRAVENOUS | Status: DC

## 2018-04-22 MED ORDER — FUROSEMIDE 10 MG/ML IJ SOLN
40.0000 mg | Freq: Once | INTRAMUSCULAR | Status: AC
  Administered 2018-04-22: 40 mg via INTRAVENOUS
  Filled 2018-04-22: qty 4

## 2018-04-22 MED ORDER — DILTIAZEM HCL 25 MG/5ML IV SOLN
INTRAVENOUS | Status: AC
  Administered 2018-04-22: 10 mg via INTRAVENOUS
  Filled 2018-04-22: qty 5

## 2018-04-22 NOTE — ED Notes (Signed)
Report to Hubbard, RN [facility]

## 2018-04-22 NOTE — ED Notes (Signed)
Ativan 0.5 wasted after Dr Earnest Conroy suggested that we hold due to pulse ox of 83 per cent

## 2018-04-22 NOTE — ED Notes (Signed)
Pt has pulled out 2nd IV

## 2018-04-22 NOTE — ED Notes (Signed)
Pt intubated with 7.5 ET  23 at the lip   tital vol 550   resp rate 28   Peep 5   At 100 percent

## 2018-04-22 NOTE — ED Notes (Signed)
Bottle of profofol wasted insink  Kim RT witness

## 2018-04-22 NOTE — ED Notes (Signed)
R IV pulled out during lab draw

## 2018-04-22 NOTE — ED Notes (Signed)
CRITICAL VALUE ALERT  Critical Value:  Potassium 2.5, trop 0.03  Date & Time Notied:  1515, 04/22/2018  Provider Notified: Dr. Roderic Palau  Orders Received/Actions taken: no orders received at this time

## 2018-04-22 NOTE — H&P (Addendum)
History and Physical    SEVILLE DOWNS [id number] DOB: Feb 03, 1951 DOA: 04/22/2018  PCP: Rosita Fire, MD   Patient coming from: Home  I have personally briefly reviewed patient's old medical records in [place]  Chief Complaint: SOB, Cough, loose stools  HPI: Kyle Lowery is a 68 y.o. male with medical history significant for CAD, COPD, depression, who presented to the ED with complaints of flulike symptoms of 2 weeks duration.  Has a baseline productive cough but cough is much worse and much less productive.  Patient reports fevers, reports maximum temperature of 99, he has been taking Advil.  Reports generalized body aches, with loose stools.  He has had 3 loose stools today.  Patient saw his primary care provider and was prescribed a course of Z-Pak which he was compliant with.  But as his symptoms worsened he presented to [facility], where he was subsequently admitted from Tuesday through Thursday- 3/10- 3/12, for flulike symptoms, prescribed Tamiflu, spouse is unaware if patient was given antibiotics, but she said they were told he did not have a pneumonia. 2 grandchildren home patient was in contact with tested positive for influenza.  Patient denies travels, or contact with any other ill persons.  ED Course: Blood pressure systolic 287O to 676H.  Heart rate initially 180s.  Tachypneic to 32.  WBC 6.4, with mildly reduced absolute lymphocyte count- 0.6, relative count 9%, BNP elevated 433, no old to compare.  Potassium low 2.5.  Initial portable chest x-ray-nonspecific diffuse interstitial opacities in the lungs.  Consider atypical infection/pneumonia.  Pulmonary edema is possible as well but there is no evidence of cardiomegaly.  Clear short of breath in the ED with increased work of breathing, O2 increased to 6 L to maintain sats above 91%.  Patient placed on BiPAP.  On repeat portable chest x-ray showed mild worsening of diffuse bilateral pulmonary opacities.  An atypical  infection should be considered.  Early developing multifocal bilateral pneumonia is not completely excluded.  Patient was given IV Lasix 40 mg x 1 as patient had received 800 mils of fluid in the ED.  And also started on Cardizem drip for initial tachycardia, HR repeat EKG showed atrial fibrillation.  And subsequently converted to sinus rhythm.   Review of Systems: As per HPI all other systems reviewed and negative.  Past Medical History:  Diagnosis Date  . Carotid artery stenosis   . COPD (chronic obstructive pulmonary disease) ([place])   . Coronary atherosclerosis of native coronary artery    Mild to moderate NOCAD with 80% ostial diagonal 1/12  . Depression    Prior suicide attempt  . Essential hypertension, benign   . GERD (gastroesophageal reflux disease)    Esophageal dilatation  . Mixed hyperlipidemia   . Syncope    Neurocardiogenic    Past Surgical History:  Procedure Laterality Date  . ESOPHAGOGASTRODUODENOSCOPY (EGD) WITH ESOPHAGEAL DILATION    . INGUINAL HERNIA REPAIR    . LEFT HEART CATH     Unable to Stent Blockage  . Left orchiectomy    . VASECTOMY       reports that he has been smoking cigarettes. He started smoking about 54 years ago. He has a 53.00 pack-year smoking history. He has never used smokeless tobacco. He reports that he does not drink alcohol or use drugs.  No Known Allergies  Family History  Problem Relation Age of Onset  . Depression Father        Suicide age  33  . Hypercholesterolemia Father   . Hypertension Father   . Hypertension Mother   . Melanoma Mother   . Hypertension Brother   . Lung disease Neg Hx     Prior to Admission medications   Medication Sig Start Date End Date Taking? Authorizing Provider  albuterol (PROVENTIL HFA;VENTOLIN HFA) 108 (90 BASE) MCG/ACT inhaler Inhale 2 puffs into the lungs every 6 (six) hours as needed for wheezing or shortness of breath.   Yes [provider]  amLODipine (NORVASC) 5 MG tablet Take 5  mg by mouth daily.   Yes [provider]  lisinopril-hydrochlorothiazide (PRINZIDE,ZESTORETIC) 20-12.5 MG per tablet Take 0.5 tablets by mouth daily. Patient taking differently: Take 1 tablet by mouth daily.  07/10/13  Yes Satira Sark, MD  metoprolol succinate (TOPROL-XL) 25 MG 24 hr tablet Take 25 mg by mouth daily.   Yes [provider]  omeprazole (PRILOSEC) 20 MG capsule Take 20 mg by mouth daily.   Yes [provider]  oseltamivir (TAMIFLU) 75 MG capsule Take 75 mg by mouth 2 (two) times daily.   Yes [provider]  PARoxetine (PAXIL) 20 MG tablet Take 20 mg by mouth daily.   Yes [provider]  pravastatin (PRAVACHOL) 20 MG tablet Take 20 mg by mouth daily.   Yes [provider]    Physical Exam: Vitals:   04/22/18 1551 04/22/18 1600 04/22/18 1630 04/22/18 1700  BP:  133/71 139/81 120/70  Pulse: 100 (!) 102  93  Resp: (!) 21 17 (!) 23 (!) 29  Temp:      TempSrc:      SpO2: 97% 96%  96%  Weight:      Height:        Constitutional: Increased work of breathing moderate to severe respiratory distress Vitals:   04/22/18 1551 04/22/18 1600 04/22/18 1630 04/22/18 1700  BP:  133/71 139/81 120/70  Pulse: 100 (!) 102  93  Resp: (!) 21 17 (!) 23 (!) 29  Temp:      TempSrc:      SpO2: 97% 96%  96%  Weight:      Height:       Eyes: PERRL, lids and conjunctivae normal ENMT: Mucous membranes are moist. Posterior pharynx clear of any exudate or lesions.  Neck: normal, supple, no masses, no thyromegaly Respiratory: Marked decreased air entry bilaterally, no wheezing or crackles appreciated, accessory muscle use. Cardiovascular: Regular rate and rhythm, no murmurs / rubs / gallops. No extremity edema. 2+ pedal pulses. Abdomen: no tenderness, no masses palpated. No hepatosplenomegaly. Bowel sounds positive.  Musculoskeletal: no clubbing / cyanosis. No joint deformity upper and lower extremities. Good ROM, no contractures. Normal  muscle tone.  Skin: no rashes, lesions, ulcers. No induration Neurologic: CN 2-12 grossly intact.  Strength 5/5 in all 4.  Psychiatric: Normal judgment and insight. Alert and oriented x 3. Normal mood.   Labs on Admission: I have personally reviewed following labs and imaging studies  CBC: Recent Labs  Lab 04/22/18 1430  WBC 6.4  NEUTROABS 5.2  HGB 14.1  HCT 40.6  MCV 96.7  PLT 759   Basic Metabolic Panel: Recent Labs  Lab 04/22/18 1430  NA 135  K 2.5*  CL 95*  CO2 25  GLUCOSE 113*  BUN 21  CREATININE 0.87  CALCIUM 7.9*   Liver Function Tests: Recent Labs  Lab 04/22/18 1430  AST 21  ALT 15  ALKPHOS 45  BILITOT 1.8*  PROT 6.6  ALBUMIN 2.8*   Cardiac Enzymes: Recent Labs  Lab 04/22/18 1430  TROPONINI 0.03*    Radiological Exams on Admission: Dg Chest Portable 1 View  Result Date: 04/22/2018 CLINICAL DATA:  High heart rate. EXAM: PORTABLE CHEST 1 VIEW COMPARISON:  April 17, 2018 FINDINGS: The heart size is normal. The hila and mediastinum are unremarkable. Diffuse bilateral pulmonary interstitial opacities are identified. No focal infiltrates, nodules, or masses. No pneumothorax. No other acute abnormalities. IMPRESSION: Diffuse interstitial opacities in the lungs are nonspecific. An atypical infection/pneumonia should be considered. Pulmonary edema is possible as well but there is no evidence of cardiomegaly. Electronically Signed   By: Dorise Bullion III M.D   On: 04/22/2018 15:15    EKG: Independently reviewed.   Assessment/Plan Active Problems:   Pneumonia   Acute respiratory failure-currently requiring BiPAP.  Chest x-ray shows bilateral pulmonary opacities, worse on repeat xray.  Flulike symptoms but negative influenza, treated with Tamiflu.  WBC 6.4 , but with mild lymphopenia. Diarrhea.  Reported temperature maximum 99, taking ibuprofen. Recent hospitalization meets criteria for HCAP. Possible bacterial versus viral infectious etiology, COPD  contributing to hypoxemia.  -With concern for Viral infection, Co-VID 19, Talked to ID on call Dr. Dallie Dad not exactly meeting criteria for testing, no sick contacts, no travel history, other possible diagnosis could explain symptoms, CHF considering elevated BNP and emphysema.  Also long duration of symptoms- 2 weeks, not exactly consistent with viral etiology.  Obtain respiratory virus panel.  Continue droplet precautions. - Trial of BiPAP, failed, with worsening respiratory status while on BiPAP, increased work of breathing, appearing to be exhausted, talked to ED provider decision was made to intubate patient. - Talked to spouse at bedside, patient is a full code. - Mechanical Ventilation , Respiratory Protocol,  -PRN fentanyl and Versed gtt. -IV Famotidine -Talked to PCCM, Dr. Silvano Rusk who graciously accepted patient, he will be transferred to [facility] hospital.  Recommended procalcitonin- ordered, also agreed with droplet precautions.  Negative pressure room. -Obtain echocardiogram -Will start Broad-spectrum antibiotics IV vancomycin and cefepime - CTA chest deferred with decline in respiratory status. Reconsider pending clinical course. - CBC BMP a.m. - gentle fluids- N/s + 40kcl 50cc/hr  -Addendum- Air borne precautions ordered, Co-vid 19 testing to be done, after conversation with Dr. Baxter Flattery.  Atria Fibrillation with RVR- no prior hx,  likely driven by acute illness with infectious etiology.  Rates up to 180s converted to sinus on IV Cardizem, now off cardizem. Blood pressure soft.  -held off on anticoagulation for now - PRN Metoprolol with holding parameters  Diarrhea-around days, likely related to viral infection.  Avril days episodes today, no Abdominal pain.  Hypokalemia, mild hypomag.   - Stool C. Difficile with recent hospitalization. - Enteric precuations  COPD-on home O2. Likely  exacerbated with marked reduced air entry bilaterally, no wheezing. - Held off on  steroids at this time with possible viral etiology. - Duonebs, sch and PRN  CAD hx- initial EKG tachycardia, but nonspecific T wave changes.  Troponin  0.03 >0.16, Likely demand ischemia.  Patient denied chest pain. - Trend Troponin  Prolonged QTC, hypokalemia- 537.  Likely 2/2 hypokalemia - K- 2.5- Likely 2/2 diarrhea and possibly HCTZ. Mag- 1.6 - Replete K - BMP a.m  HTN- blood pressure range 104- 180s on cardizem and sedatives.  - Hold Lisinopril- HCTZ, metoprolol  DVT prophylaxis: Lovenox Code Status: Full Family Communication: Spouse at bedside Disposition Plan:  Per rounding team Consults called: PCCM, ID Admission status: Inpt,  Step down   Bethena Roys MD [facility]  04/22/2018, 8:57 PM

## 2018-04-22 NOTE — ED Notes (Signed)
meds not holding pt   He is agitated and pulling at lines

## 2018-04-22 NOTE — ED Provider Notes (Signed)
[facility] EMERGENCY DEPARTMENT Provider Note   CSN: [id number] Arrival date & time: 04/22/18  1354    History   Chief Complaint Chief Complaint  Patient presents with   Emesis    HPI Kyle Lowery is a 68 y.o. male.     Patient presents to the emergency department with a two-week history of cough and congestion he was admitted to [facility] 3 days ago and told his flu test was negative but was sent home on Tamiflu anyway he was also told he did not have pneumonia.  Patient complains of palpitation and weakness  The history is provided by the patient. No language interpreter was used.  Weakness  Severity:  Severe Onset quality:  Sudden Timing:  Constant Progression:  Worsening Chronicity:  New Context: not alcohol use   Relieved by:  Nothing Worsened by:  Nothing Ineffective treatments:  None tried Associated symptoms: no abdominal pain, no chest pain, no cough, no diarrhea, no frequency, no headaches and no seizures   Risk factors: no anemia     Past Medical History:  Diagnosis Date   Carotid artery stenosis    COPD (chronic obstructive pulmonary disease) ([place])    Coronary atherosclerosis of native coronary artery    Mild to moderate NOCAD with 80% ostial diagonal 1/12   Depression    Prior suicide attempt   Essential hypertension, benign    GERD (gastroesophageal reflux disease)    Esophageal dilatation   Mixed hyperlipidemia    Syncope    Neurocardiogenic    Patient Active Problem List   Diagnosis Date Noted   Pneumonia 04/22/2018   Multiple lung nodules on CT 02/12/2016   Dysphagia 02/12/2016   Depression 02/12/2016   GERD (gastroesophageal reflux disease) 02/12/2016   Pulmonary emphysema ([place]) 02/12/2016   COPD, moderate ([place]) 04/22/2013   Headache(784.0) 03/11/2011   Vasovagal syncope 03/11/2011   Tobacco use disorder 02/18/2010   CORONARY ATHEROSCLEROSIS NATIVE CORONARY ARTERY 02/18/2010   HYPERLIPIDEMIA 02/17/2010     Essential hypertension, benign 02/17/2010    Past Surgical History:  Procedure Laterality Date   ESOPHAGOGASTRODUODENOSCOPY (EGD) WITH ESOPHAGEAL DILATION     INGUINAL HERNIA REPAIR     LEFT HEART CATH     Unable to Stent Blockage   Left orchiectomy     VASECTOMY          Home Medications    Prior to Admission medications   Medication Sig Start Date End Date Taking? Authorizing Provider  albuterol (PROVENTIL HFA;VENTOLIN HFA) 108 (90 BASE) MCG/ACT inhaler Inhale 2 puffs into the lungs every 6 (six) hours as needed for wheezing or shortness of breath.   Yes [provider]  amLODipine (NORVASC) 5 MG tablet Take 5 mg by mouth daily.   Yes [provider]  lisinopril-hydrochlorothiazide (PRINZIDE,ZESTORETIC) 20-12.5 MG per tablet Take 0.5 tablets by mouth daily. Patient taking differently: Take 1 tablet by mouth daily.  07/10/13  Yes Satira Sark, MD  metoprolol succinate (TOPROL-XL) 25 MG 24 hr tablet Take 25 mg by mouth daily.   Yes [provider]  omeprazole (PRILOSEC) 20 MG capsule Take 20 mg by mouth daily.   Yes [provider]  oseltamivir (TAMIFLU) 75 MG capsule Take 75 mg by mouth 2 (two) times daily.   Yes [provider]  PARoxetine (PAXIL) 20 MG tablet Take 20 mg by mouth daily.   Yes [provider]  pravastatin (PRAVACHOL) 20 MG tablet Take 20 mg by mouth daily.   Yes  [provider]    Family History Family History  Problem Relation Age of Onset   Depression Father        Suicide age 47   Hypercholesterolemia Father    Hypertension Father    Hypertension Mother    Melanoma Mother    Hypertension Brother    Lung disease Neg Hx     Social History Social History   Tobacco Use   Smoking status: Current Every Day Smoker    Packs/day: 1.00    Years: 53.00    Pack years: 53.00    Types: Cigarettes    Start date: 08/23/1963   Smokeless tobacco: Never Used   Tobacco comment:  Peak rate of 2ppd  Substance Use Topics   Alcohol use: No    Alcohol/week: 0.0 standard drinks   Drug use: No     Allergies   Patient has no known allergies.   Review of Systems Review of Systems  Constitutional: Negative for appetite change and fatigue.  HENT: Negative for congestion, ear discharge and sinus pressure.   Eyes: Negative for discharge.  Respiratory: Negative for cough.   Cardiovascular: Positive for palpitations. Negative for chest pain.  Gastrointestinal: Negative for abdominal pain and diarrhea.  Genitourinary: Negative for frequency and hematuria.  Musculoskeletal: Negative for back pain.  Skin: Negative for rash.  Neurological: Positive for weakness. Negative for seizures and headaches.  Psychiatric/Behavioral: Negative for hallucinations.     Physical Exam Updated Vital Signs BP (!) 188/148    Pulse (!) 128    Temp 98 F (36.7 C) (Oral)    Resp 14    Ht 5\' 8"  (1.727 m)    Wt 59 kg    SpO2 100%    BMI 19.77 kg/m   Physical Exam Vitals signs and nursing note reviewed.  Constitutional:      Appearance: He is well-developed.  HENT:     Head: Normocephalic.     Nose: Nose normal.  Eyes:     General: No scleral icterus.    Conjunctiva/sclera: Conjunctivae normal.  Neck:     Musculoskeletal: Neck supple.     Thyroid: No thyromegaly.  Cardiovascular:     Heart sounds: No murmur. No friction rub. No gallop.      Comments: Rapid irregular rate. Pulmonary:     Breath sounds: No stridor. Wheezing present. No rales.  Chest:     Chest wall: No tenderness.  Abdominal:     General: There is no distension.     Tenderness: There is no abdominal tenderness. There is no rebound.  Musculoskeletal: Normal range of motion.  Lymphadenopathy:     Cervical: No cervical adenopathy.  Skin:    Findings: No erythema or rash.  Neurological:     Mental Status: He is oriented to person, place, and time.     Motor: No abnormal muscle tone.     Coordination:  Coordination normal.  Psychiatric:        Behavior: Behavior normal.      ED Treatments / Results  Labs (all labs ordered are listed, but only abnormal results are displayed) Labs Reviewed  CBC WITH DIFFERENTIAL/PLATELET - Abnormal; Notable for the following components:      Result Value   RBC 4.20 (*)    nRBC 0.3 (*)    Lymphs Abs 0.6 (*)    Abs Immature Granulocytes 0.14 (*)    All other components within normal limits  COMPREHENSIVE METABOLIC PANEL - Abnormal; Notable for the following components:  Potassium 2.5 (*)    Chloride 95 (*)    Glucose, Bld 113 (*)    Calcium 7.9 (*)    Albumin 2.8 (*)    Total Bilirubin 1.8 (*)    All other components within normal limits  TROPONIN I - Abnormal; Notable for the following components:   Troponin I 0.03 (*)    All other components within normal limits  BRAIN NATRIURETIC PEPTIDE - Abnormal; Notable for the following components:   B Natriuretic Peptide 433.0 (*)    All other components within normal limits  MAGNESIUM - Abnormal; Notable for the following components:   Magnesium 1.6 (*)    All other components within normal limits  BLOOD GAS, ARTERIAL - Abnormal; Notable for the following components:   pH, Arterial 7.166 (*)    pCO2 arterial 68.5 (*)    Bicarbonate 19.5 (*)    Acid-base deficit 3.6 (*)    All other components within normal limits  RESPIRATORY PANEL BY PCR  CULTURE, BLOOD (ROUTINE X 2)  CULTURE, BLOOD (ROUTINE X 2)  C DIFFICILE QUICK SCREEN W PCR REFLEX  INFLUENZA PANEL BY PCR (TYPE A & B)  LACTIC ACID, PLASMA  BLOOD GAS, ARTERIAL    EKG None  Radiology Dg Chest Portable 1 View  Result Date: 04/22/2018 CLINICAL DATA:  Flu like symptoms. EXAM: PORTABLE CHEST 1 VIEW COMPARISON:  April 22, 2018 and April 17, 2018 FINDINGS: The cardiomediastinal silhouette is normal. No pneumothorax. Diffuse bilateral pulmonary opacities appear to be a little more confluent in the bases than earlier today. No other acute  abnormalities. IMPRESSION: 1. Mild worsening of diffuse bilateral pulmonary opacities. An atypical infection should be considered. Early developing multifocal bacterial pneumonia is not completely excluded. Recommend clinical correlation and close attention on follow-up. Electronically Signed   By: Dorise Bullion III M.D   On: 04/22/2018 18:34   Dg Chest Portable 1 View  Result Date: 04/22/2018 CLINICAL DATA:  High heart rate. EXAM: PORTABLE CHEST 1 VIEW COMPARISON:  April 17, 2018 FINDINGS: The heart size is normal. The hila and mediastinum are unremarkable. Diffuse bilateral pulmonary interstitial opacities are identified. No focal infiltrates, nodules, or masses. No pneumothorax. No other acute abnormalities. IMPRESSION: Diffuse interstitial opacities in the lungs are nonspecific. An atypical infection/pneumonia should be considered. Pulmonary edema is possible as well but there is no evidence of cardiomegaly. Electronically Signed   By: Dorise Bullion III M.D   On: 04/22/2018 15:15    Procedures Procedure Name: Intubation Date/Time: 04/22/2018 7:52 PM Performed by: [name] Ferguson, MD Pre-anesthesia Checklist: Patient identified Oxygen Delivery Method: Ambu bag Preoxygenation: Pre-oxygenation with 100% oxygen Induction Type: IV induction and Rapid sequence Ventilation: Mask ventilation without difficulty Laryngoscope Size: Glidescope Grade View: Grade I Tube type: Non-subglottic suction tube Tube size: 7.5 mm Number of attempts: 1 Placement Confirmation: ETT inserted through vocal cords under direct vision,  Positive ETCO2 and Breath sounds checked- equal and bilateral Secured at: 23 cm Tube secured with: Tape Comments: Patient was intubated without difficulty.      (including critical care time)  Medications Ordered in ED Medications  diltiazem (CARDIZEM) 100 mg in dextrose 5 % 100 mL (1 mg/mL) infusion (10 mg/hr Intravenous Rate/Dose Change 04/22/18 1446)  ipratropium-albuterol  (DUONEB) 0.5-2.5 (3) MG/3ML nebulizer solution 3 mL (3 mLs Nebulization Given 04/22/18 1810)  iohexol (OMNIPAQUE) 350 MG/ML injection 100 mL (has no administration in time range)  vancomycin (VANCOCIN) 1,250 mg in sodium chloride 0.9 % 250 mL IVPB (has no administration  in time range)    Followed by  vancomycin (VANCOCIN) IVPB 750 mg/150 ml premix (has no administration in time range)  ceFEPIme (MAXIPIME) 2 g in sodium chloride 0.9 % 100 mL IVPB (2 g Intravenous New Bag/Given 04/22/18 1931)  nitroGLYCERIN 50 mg in dextrose 5 % 250 mL (0.2 mg/mL) infusion (5 mcg/min Intravenous New Bag/Given 04/22/18 1901)  magnesium sulfate IVPB 2 g 50 mL (has no administration in time range)  famotidine (PEPCID) IVPB 20 mg premix (has no administration in time range)  ipratropium (ATROVENT) nebulizer solution 0.5 mg (has no administration in time range)  fentaNYL (SUBLIMAZE) injection 50 mcg (has no administration in time range)  fentaNYL (SUBLIMAZE) injection 50 mcg (has no administration in time range)  midazolam (VERSED) injection 1 mg (has no administration in time range)  midazolam (VERSED) injection 1 mg (has no administration in time range)  diltiazem (CARDIZEM) injection 10 mg (10 mg Intravenous Given 04/22/18 1425)  sodium chloride 0.9 % bolus 500 mL (0 mLs Intravenous Stopped 04/22/18 1505)  diltiazem (CARDIZEM) injection 10 mg (10 mg Intravenous Given 04/22/18 1440)  potassium chloride 10 mEq in 100 mL IVPB (0 mEq Intravenous Stopped 04/22/18 1806)  potassium chloride 10 mEq in 100 mL IVPB ( Intravenous Stopped 04/22/18 1703)  potassium chloride SA (K-DUR,KLOR-CON) CR tablet 40 mEq (40 mEq Oral Given 04/22/18 1601)  furosemide (LASIX) injection 40 mg (40 mg Intravenous Given 04/22/18 1748)  LORazepam (ATIVAN) injection 0.5 mg (0.5 mg Intravenous Given 04/22/18 1904)  LORazepam (ATIVAN) 2 MG/ML injection (0.5 mg  Given 04/22/18 1918)     Initial Impression / Assessment and Plan / ED Course  I have reviewed  the triage vital signs and the nursing notes.  Pertinent labs & imaging results that were available during my care of the patient were reviewed by me and considered in my medical decision making (see chart for details).       CRITICAL CARE Performed by: [name] Ferguson Total critical care time: 60 minutes Critical care time was exclusive of separately billable procedures and treating other patients. Critical care was necessary to treat or prevent imminent or life-threatening deterioration. Critical care was time spent personally by me on the following activities: development of treatment plan with patient and/or surrogate as well as nursing, discussions with consultants, evaluation of patient's response to treatment, examination of patient, obtaining history from patient or surrogate, ordering and performing treatments and interventions, ordering and review of laboratory studies, ordering and review of radiographic studies, pulse oximetry and re-evaluation of patient's condition.  The patient presented in rapid atrial fib flutter.  He responded to the 20 of Cardizem and at 10 mg a minute drip.  Patient felt much better.  Arrangement was made for him to be admitted to the hospitalist for rapid atrial fib along with hypokalemia.  While the patient was waiting in the emergency department to be admitted.  He decompensated.  Patient became more short of breath and diaphoretic.  Repeat chest x-ray shows worsening of potential infiltrates.  Possible atypical pneumonia or viral.  Or possible congestive heart failure but more consistent with infection.  Patient need to be intubated.  Patient tolerated the procedure well Final Clinical Impressions(s) / ED Diagnoses   Final diagnoses:  Endotracheal tube present  Endotracheal tube present    ED Discharge Orders    None       [name] Ferguson, MD 04/22/18 1954

## 2018-04-22 NOTE — ED Notes (Addendum)
Pt is mottled in his extremities   Per his spouse  Neither pt nor family have been out of country, at [facility], nor have any of the [place], [place], [place]

## 2018-04-22 NOTE — ED Notes (Signed)
CRITICAL VALUE ALERT  Critical Value:  Troponin - 0.16  Date & Time Notied:  04/22/18   2236  Provider Notified: Dr Denton Brick  Orders Received/Actions taken:

## 2018-04-22 NOTE — H&P (Addendum)
NAME:  Kyle Lowery, MRN:  [id number], DOB:  12/12/1950, LOS: 0 ADMISSION DATE:  04/22/2018, CONSULTATION DATE:  04/22/2018 REFERRING MD:  Dr. Roderic Palau, CHIEF COMPLAINT:  Respiratory Distress    History of present illness   68 year old male with reported 2 weeks of cough, congestion, febrile, recently went to PCP, given Z-Pak. Admitted to [facility] 3/10-3/12 with flu-like symptoms, given Tamiflu. Arrives to [facility] ED on 3/15 with A.Fib RVR, hypokalemia, hypoxia, and dyspnea. CXR with bilateral infiltrates requiring intubation. Transferred to [facility] for further evaluation.   Per wife patient went home on 3/12 however was still not feeling normal (she thinks he left against medical advice). For the past few days patient has been weak and short of breath with very poor appetite.   Past Medical History  CAD, COPD, Depression   Significant Hospital Events   3/15 > Presents to [facility] ED > Transferred to [facility]   Consults:  PCCM   Procedures:  ETT 3/15   Significant Diagnostic Tests:  CXR 3/15 > Portion of lateral right base not visualized. Infiltrate noted in visualized right base. Endotracheal tube as described without pneumothorax. Stable cardiac silhouette.  Micro Data:  Tracheal Asp 3/15 >> Flu PCR 3/15 > FLU A positive  Blood 3/15 >> Urine 3/15 >>   Antimicrobials:  Cefepime 3/15  Vancomycin 3/15   Azithromycin 3/16   Interim history/subjective:  As above   Objective   Blood pressure (!) 97/58, pulse (!) 103, temperature 98 F (36.7 C), temperature source Oral, resp. rate (!) 23, height 5\' 8"  (1.727 m), weight 59 kg, SpO2 100 %.    Vent Mode: BIPAP;PCV FiO2 (%):  [50 %] 50 % Set Rate:  [8 bmp] 8 bmp PEEP:  [6 cmH20] 6 cmH20   Intake/Output Summary (Last 24 hours) at 04/22/2018 2144 Last data filed at 04/22/2018 1907 Gross per 24 hour  Intake 771.95 ml  Output -  Net 771.95 ml   Filed Weights   04/22/18 1408  Weight: 59 kg    Examination: General: Thin adult  male, on vent, lying in bed  HENT: ETT in place  Lungs: Exp Wheeze, tachypnea, no crackles  Cardiovascular: Tachy, Irregular, no MRG  Abdomen: soft, non-distended, active bowel sounds  Extremities: -edema, mottling noted to lower extremities  Neuro: sedated, pupils intact, does not follow commands  GU: foley in place   Resolved Hospital Problem list     Assessment & Plan:   Acute Hypoxic/Hypercarbic Respiratory Distress, Secondary to Presumed Viral PNA and AECOPD  -FLU A positive -Recent Sick exposures, grandchildren +Flu -CXR with Bilateral Infiltrates  H/O COPD  Plan  -Vent Support -Trend ABG/CXR -Pulmonary Hygiene  -Currently on Cefepime/Vancomycin > Follow Culture Data, add Azithromycin  -Scheduled Duoneb, PRN Albuterol  -Solu-medrol 60 mg q6h   A.Fib RVR in setting of Sepsis vs Hypovolemia > Resolved  H/O CAD, HTN  Plan -Cardiac Monitoring -ECHO pending  -Hold Norvasc, Lisinopril, Metoprolol given borderline hypotension   Hypokalemia  Lactic Acidosis  -LA 5.8, S/P 2.5 L  Plan  -Trend BMP  -Trend LA  -Replace electrolytes as indicated   GERD  Plan  -Pepcid   Sedation Needs H/O Depression  Plan  -RASS goal 0/-1 -Titrate Fentanyl gtt to achieve RASS goal  -PRN Versed    Best practice:  Diet: NPO Pain/Anxiety/Delirium protocol (if indicated) VAP protocol (if indicated) DVT prophylaxis: Heparin SQ GI prophylaxis: PPI Glucose control: SSI Mobility: Bedrest  Code Status: FC Family Communication: Wife  and daughter updated at bedside   Labs   CBC: Recent Labs  Lab 04/22/18 1430  WBC 6.4  NEUTROABS 5.2  HGB 14.1  HCT 40.6  MCV 96.7  PLT 016    Basic Metabolic Panel: Recent Labs  Lab 04/22/18 1430  NA 135  K 2.5*  CL 95*  CO2 25  GLUCOSE 113*  BUN 21  CREATININE 0.87  CALCIUM 7.9*  MG 1.6*   GFR: Estimated Creatinine Clearance: 69.7 mL/min (by C-G formula based on SCr of 0.87 mg/dL). Recent Labs  Lab 04/22/18 1430  WBC 6.4     Liver Function Tests: Recent Labs  Lab 04/22/18 1430  AST 21  ALT 15  ALKPHOS 45  BILITOT 1.8*  PROT 6.6  ALBUMIN 2.8*   No results for input(s): LIPASE, AMYLASE in the last 168 hours. No results for input(s): AMMONIA in the last 168 hours.  ABG    Component Value Date/Time   PHART 7.228 (L) 04/22/2018 2037   PCO2ART 57.1 (H) 04/22/2018 2037   PO2ART 320 (H) 04/22/2018 2037   HCO3 20.4 04/22/2018 2037   ACIDBASEDEF 3.6 (H) 04/22/2018 2037   O2SAT 98.8 04/22/2018 2037     Coagulation Profile: No results for input(s): INR, PROTIME in the last 168 hours.  Cardiac Enzymes: Recent Labs  Lab 04/22/18 1430  TROPONINI 0.03*    HbA1C: No results found for: HGBA1C  CBG: No results for input(s): GLUCAP in the last 168 hours.  Review of Systems:   Unable to review as patient is intubated/sedated   Past Medical History  He,  has a past medical history of Carotid artery stenosis, COPD (chronic obstructive pulmonary disease) ([place]), Coronary atherosclerosis of native coronary artery, Depression, Essential hypertension, benign, GERD (gastroesophageal reflux disease), Mixed hyperlipidemia, and Syncope.   Surgical History    Past Surgical History:  Procedure Laterality Date  . ESOPHAGOGASTRODUODENOSCOPY (EGD) WITH ESOPHAGEAL DILATION    . INGUINAL HERNIA REPAIR    . LEFT HEART CATH     Unable to Stent Blockage  . Left orchiectomy    . VASECTOMY       Social History   reports that he has been smoking cigarettes. He started smoking about 54 years ago. He has a 53.00 pack-year smoking history. He has never used smokeless tobacco. He reports that he does not drink alcohol or use drugs.   Family History   His family history includes Depression in his father; Hypercholesterolemia in his father; Hypertension in his brother, father, and mother; Melanoma in his mother. There is no history of Lung disease.   Allergies No Known Allergies   Home Medications  Prior to  Admission medications   Medication Sig Start Date End Date Taking? Authorizing Provider  albuterol (PROVENTIL HFA;VENTOLIN HFA) 108 (90 BASE) MCG/ACT inhaler Inhale 2 puffs into the lungs every 6 (six) hours as needed for wheezing or shortness of breath.   Yes [provider]  amLODipine (NORVASC) 5 MG tablet Take 5 mg by mouth daily.   Yes [provider]  lisinopril-hydrochlorothiazide (PRINZIDE,ZESTORETIC) 20-12.5 MG per tablet Take 0.5 tablets by mouth daily. Patient taking differently: Take 1 tablet by mouth daily.  07/10/13  Yes Satira Sark, MD  metoprolol succinate (TOPROL-XL) 25 MG 24 hr tablet Take 25 mg by mouth daily.   Yes [provider]  omeprazole (PRILOSEC) 20 MG capsule Take 20 mg by mouth daily.   Yes [provider]  oseltamivir (TAMIFLU) 75 MG capsule Take 75 mg by  mouth 2 (two) times daily.   Yes [provider]  PARoxetine (PAXIL) 20 MG tablet Take 20 mg by mouth daily.   Yes [provider]  pravastatin (PRAVACHOL) 20 MG tablet Take 20 mg by mouth daily.   Yes [provider]     Critical care time: 45 minutes     Hayden Pedro, AGACNP-BC [facility]  PCCM Pgr: [phone]

## 2018-04-22 NOTE — Progress Notes (Signed)
Pharmacy Antibiotic Note  Kyle Lowery is a 68 y.o. male admitted on 04/22/2018 with HCAP/ pneumonia.  Pharmacy has been consulted for Vancomycin and Cefepime dosing.  Plan: Vancomycin 1250mg  IV loading dose, then 750mg  IV every 12 hours.  Goal trough 15-20 mcg/mL.  Cefepime 2gm IV q8h F/U cxs and clinical progress Monitor V/S, labs, and levels as indicated  Height: 5\' 8"  (172.7 cm) Weight: 130 lb (59 kg) IBW/kg (Calculated) : 68.4  Temp (24hrs), Avg:98 F (36.7 C), Min:98 F (36.7 C), Max:98 F (36.7 C)  Recent Labs  Lab 04/22/18 1430  WBC 6.4  CREATININE 0.87    Estimated Creatinine Clearance: 69.7 mL/min (by C-G formula based on SCr of 0.87 mg/dL).    No Known Allergies  Antimicrobials this admission: Vancomycin 3/15 >>  Cefepime 3/15 >>   Dose adjustments this admission: N/A  Microbiology results:   BCx:   MRSA PCR:   Thank you for allowing pharmacy to be a part of this patient's care.  Isac Sarna, BS [place], [place] Clinical Pharmacist Pager [phone] 04/22/2018 6:21 PM

## 2018-04-22 NOTE — ED Triage Notes (Signed)
Patient c/o flu-like symptoms that started x2 weeks. Patient seen by PCP 1.5 weeks ago and treated for bronchitis with z-pack, inhaler and prednisone in which he did not improve.  Patient seen at [facility] ER on Tuesday and admitted, released on Thursday. Per wife patient checked for flu but was negative, patient given tamiflu in which he is still taking with no improvement. Per patient generalized body aches, nausea, vomiting, diarrhea, cough, and shortness of breath. Grandchildren recently have had flu. Patient taking advil with tamiflu with last dose yesterday.

## 2018-04-22 NOTE — ED Notes (Signed)
CRITICAL VALUE ALERT  Critical Value:  Lactic acid 5.8  Date & Time Notied:  04/22/2018 2201  Provider Notified: Dr. Denton Brick  Orders Received/Actions taken: See chart

## 2018-04-22 NOTE — ED Notes (Signed)
Pt fights vent with profofol  At 5   Any increase results in hypotension

## 2018-04-22 NOTE — ED Notes (Signed)
Intubation with 7.5 ET   atomidate 20 mg   sux 125 mg

## 2018-04-22 NOTE — ED Notes (Addendum)
Pt began to have breathing difficulties. Pt very diaphoretic. Labored breathing, tachypnea.  O2 sat 88% on 3L O2 via [place]. Respiratory called to bedside. EDP notified. Orders given for Lasix IV, bipap and breathing treatment.

## 2018-04-23 ENCOUNTER — Inpatient Hospital Stay (HOSPITAL_COMMUNITY): Payer: BLUE CROSS/BLUE SHIELD

## 2018-04-23 DIAGNOSIS — Z978 Presence of other specified devices: Secondary | ICD-10-CM

## 2018-04-23 DIAGNOSIS — J9601 Acute respiratory failure with hypoxia: Secondary | ICD-10-CM

## 2018-04-23 DIAGNOSIS — R451 Restlessness and agitation: Secondary | ICD-10-CM

## 2018-04-23 DIAGNOSIS — J9602 Acute respiratory failure with hypercapnia: Secondary | ICD-10-CM

## 2018-04-23 DIAGNOSIS — Z9911 Dependence on respirator [ventilator] status: Secondary | ICD-10-CM

## 2018-04-23 DIAGNOSIS — J1100 Influenza due to unidentified influenza virus with unspecified type of pneumonia: Secondary | ICD-10-CM

## 2018-04-23 DIAGNOSIS — J441 Chronic obstructive pulmonary disease with (acute) exacerbation: Secondary | ICD-10-CM

## 2018-04-23 DIAGNOSIS — J9600 Acute respiratory failure, unspecified whether with hypoxia or hypercapnia: Secondary | ICD-10-CM

## 2018-04-23 LAB — PHOSPHORUS
Phosphorus: 3.8 mg/dL (ref 2.5–4.6)
Phosphorus: 4.2 mg/dL (ref 2.5–4.6)

## 2018-04-23 LAB — GLUCOSE, CAPILLARY
GLUCOSE-CAPILLARY: 112 mg/dL — AB (ref 70–99)
GLUCOSE-CAPILLARY: 169 mg/dL — AB (ref 70–99)
Glucose-Capillary: 96 mg/dL (ref 70–99)
Glucose-Capillary: 114 mg/dL — ABNORMAL HIGH (ref 70–99)
Glucose-Capillary: 110 mg/dL — ABNORMAL HIGH (ref 70–99)
Glucose-Capillary: 106 mg/dL — ABNORMAL HIGH (ref 70–99)
Glucose-Capillary: 118 mg/dL — ABNORMAL HIGH (ref 70–99)
Glucose-Capillary: 166 mg/dL — ABNORMAL HIGH (ref 70–99)

## 2018-04-23 LAB — POCT I-STAT 7, (LYTES, BLD GAS, ICA,H+H)
Acid-base deficit: 2 mmol/L (ref 0.0–2.0)
Bicarbonate: 25.1 mmol/L (ref 20.0–28.0)
Calcium, Ion: 1.08 mmol/L — ABNORMAL LOW (ref 1.15–1.40)
HCT: 31 % — ABNORMAL LOW (ref 39.0–52.0)
Hemoglobin: 10.5 g/dL — ABNORMAL LOW (ref 13.0–17.0)
O2 Saturation: 99 %
Patient temperature: 36.3
Potassium: 3 mmol/L — ABNORMAL LOW (ref 3.5–5.1)
SODIUM: 137 mmol/L (ref 135–145)
TCO2: 27 mmol/L (ref 22–32)
pCO2 arterial: 50.2 mmHg — ABNORMAL HIGH (ref 32.0–48.0)
pH, Arterial: 7.303 — ABNORMAL LOW (ref 7.350–7.450)
pO2, Arterial: 165 mmHg — ABNORMAL HIGH (ref 83.0–108.0)

## 2018-04-23 LAB — CBC
HCT: 36.4 % — ABNORMAL LOW (ref 39.0–52.0)
Hemoglobin: 12.3 g/dL — ABNORMAL LOW (ref 13.0–17.0)
MCH: 33.7 pg (ref 26.0–34.0)
MCHC: 33.8 g/dL (ref 30.0–36.0)
MCV: 99.7 fL (ref 80.0–100.0)
PLATELETS: 151 10*3/uL (ref 150–400)
RBC: 3.65 MIL/uL — ABNORMAL LOW (ref 4.22–5.81)
RDW: 14.8 % (ref 11.5–15.5)
WBC: 9.4 10*3/uL (ref 4.0–10.5)
nRBC: 0.4 % — ABNORMAL HIGH (ref 0.0–0.2)

## 2018-04-23 LAB — BASIC METABOLIC PANEL
Anion gap: 14 (ref 5–15)
BUN: 23 mg/dL (ref 8–23)
CALCIUM: 7.2 mg/dL — AB (ref 8.9–10.3)
CO2: 20 mmol/L — ABNORMAL LOW (ref 22–32)
Chloride: 102 mmol/L (ref 98–111)
Creatinine, Ser: 1.2 mg/dL (ref 0.61–1.24)
GFR calc Af Amer: >60 mL/min (ref >60)
GFR calc non Af Amer: >60 mL/min (ref >60)
Glucose, Bld: 103 mg/dL — ABNORMAL HIGH (ref 70–99)
Potassium: 3.6 mmol/L (ref 3.5–5.1)
SODIUM: 136 mmol/L (ref 135–145)

## 2018-04-23 LAB — LACTIC ACID, PLASMA
Lactic Acid, Venous: 4.6 mmol/L (ref 0.5–1.9)
Lactic Acid, Venous: 2.5 mmol/L (ref 0.5–1.9)

## 2018-04-23 LAB — ECHOCARDIOGRAM COMPLETE
Height: 68 in
Weight: 2080 oz

## 2018-04-23 LAB — MRSA PCR SCREENING: MRSA by PCR: NEGATIVE

## 2018-04-23 LAB — MAGNESIUM
Magnesium: 2 mg/dL (ref 1.7–2.4)
Magnesium: 1.9 mg/dL (ref 1.7–2.4)

## 2018-04-23 MED ORDER — SODIUM CHLORIDE 0.9 % IV BOLUS
1000.0000 mL | Freq: Once | INTRAVENOUS | Status: AC
  Administered 2018-04-23: 1000 mL via INTRAVENOUS

## 2018-04-23 MED ORDER — INSULIN ASPART 100 UNIT/ML ~~LOC~~ SOLN
2.0000 [IU] | SUBCUTANEOUS | Status: DC
  Administered 2018-04-23 (×2): 4 [IU] via SUBCUTANEOUS
  Administered 2018-04-24 (×4): 2 [IU] via SUBCUTANEOUS
  Administered 2018-04-25 (×2): 4 [IU] via SUBCUTANEOUS
  Administered 2018-04-26: 2 [IU] via SUBCUTANEOUS

## 2018-04-23 MED ORDER — ONDANSETRON HCL 4 MG PO TABS: 4.0000 mg | ORAL_TABLET | Freq: Four times a day (QID) | ORAL | Status: DC | PRN

## 2018-04-23 MED ORDER — SODIUM CHLORIDE 0.9 % IV SOLN
500.0000 mg | Freq: Every day | INTRAVENOUS | Status: AC
  Administered 2018-04-23 – 2018-04-26 (×5): 500 mg via INTRAVENOUS
  Filled 2018-04-23 (×5): qty 500

## 2018-04-23 MED ORDER — POTASSIUM CHLORIDE 10 MEQ/100ML IV SOLN
10.0000 meq | INTRAVENOUS | Status: AC
  Administered 2018-04-23 (×2): 10 meq via INTRAVENOUS
  Filled 2018-04-23 (×2): qty 100

## 2018-04-23 MED ORDER — IPRATROPIUM-ALBUTEROL 0.5-2.5 (3) MG/3ML IN SOLN: 3.0000 mL | RESPIRATORY_TRACT | Status: DC | PRN

## 2018-04-23 MED ORDER — ONDANSETRON HCL 4 MG/2ML IJ SOLN: 4.0000 mg | Freq: Four times a day (QID) | INTRAMUSCULAR | Status: DC | PRN

## 2018-04-23 MED ORDER — VANCOMYCIN HCL 10 G IV SOLR
1250.0000 mg | INTRAVENOUS | Status: AC
  Administered 2018-04-23 – 2018-04-24 (×2): 1250 mg via INTRAVENOUS
  Filled 2018-04-23 (×2): qty 1250

## 2018-04-23 MED ORDER — ORAL CARE MOUTH RINSE
15.0000 mL | OROMUCOSAL | Status: DC
  Administered 2018-04-23 – 2018-04-24 (×18): 15 mL via OROMUCOSAL

## 2018-04-23 MED ORDER — METOPROLOL TARTRATE 5 MG/5ML IV SOLN: 5.0000 mg | INTRAVENOUS | Status: DC | PRN

## 2018-04-23 MED ORDER — FAMOTIDINE 20 MG IN NS 100 ML IVPB
20.0000 mg | Freq: Two times a day (BID) | INTRAVENOUS | Status: DC
  Administered 2018-04-23 (×2): 20 mg via INTRAVENOUS
  Filled 2018-04-23 (×3): qty 100

## 2018-04-23 MED ORDER — CHLORHEXIDINE GLUCONATE 0.12% ORAL RINSE (MEDLINE KIT)
15.0000 mL | Freq: Two times a day (BID) | OROMUCOSAL | Status: DC
  Administered 2018-04-23 – 2018-04-26 (×6): 15 mL via OROMUCOSAL

## 2018-04-23 MED ORDER — POLYETHYLENE GLYCOL 3350 17 G PO PACK: 17.0000 g | PACK | Freq: Every day | ORAL | Status: DC | PRN

## 2018-04-23 MED ORDER — OSELTAMIVIR PHOSPHATE 6 MG/ML PO SUSR
30.0000 mg | Freq: Two times a day (BID) | ORAL | Status: DC
  Administered 2018-04-23 – 2018-04-24 (×4): 30 mg
  Filled 2018-04-23 (×5): qty 12.5

## 2018-04-23 MED ORDER — DEXMEDETOMIDINE HCL IN NACL 200 MCG/50ML IV SOLN
0.4000 ug/kg/h | INTRAVENOUS | Status: DC
  Administered 2018-04-23 – 2018-04-24 (×4): 0.4 ug/kg/h via INTRAVENOUS
  Administered 2018-04-25: 0.3 ug/kg/h via INTRAVENOUS
  Administered 2018-04-25: 0.5 ug/kg/h via INTRAVENOUS
  Administered 2018-04-25 (×2): 0.4 ug/kg/h via INTRAVENOUS
  Filled 2018-04-23: qty 100
  Filled 2018-04-23 (×6): qty 50

## 2018-04-23 MED ORDER — METHYLPREDNISOLONE SODIUM SUCC 125 MG IJ SOLR
60.0000 mg | Freq: Four times a day (QID) | INTRAMUSCULAR | Status: DC
  Administered 2018-04-23 – 2018-04-24 (×5): 60 mg via INTRAVENOUS
  Filled 2018-04-23 (×5): qty 2

## 2018-04-23 MED ORDER — ENOXAPARIN SODIUM 40 MG/0.4ML ~~LOC~~ SOLN
40.0000 mg | Freq: Every day | SUBCUTANEOUS | Status: DC
  Administered 2018-04-23 – 2018-05-03 (×11): 40 mg via SUBCUTANEOUS
  Filled 2018-04-23 (×12): qty 0.4

## 2018-04-23 MED ORDER — ACETAMINOPHEN 325 MG PO TABS
650.0000 mg | ORAL_TABLET | Freq: Four times a day (QID) | ORAL | Status: DC | PRN
  Administered 2018-05-04 – 2018-05-06 (×2): 650 mg via ORAL
  Filled 2018-04-23 (×2): qty 2

## 2018-04-23 MED ORDER — POTASSIUM CHLORIDE 20 MEQ/15ML (10%) PO SOLN
40.0000 meq | Freq: Once | ORAL | Status: AC
  Administered 2018-04-23: 40 meq
  Filled 2018-04-23: qty 30

## 2018-04-23 MED ORDER — SODIUM CHLORIDE 0.9 % IV SOLN
INTRAVENOUS | Status: DC
  Administered 2018-04-23: 02:00:00 via INTRAVENOUS

## 2018-04-23 MED ORDER — PRAVASTATIN SODIUM 10 MG PO TABS
20.0000 mg | ORAL_TABLET | Freq: Every day | ORAL | Status: DC
  Administered 2018-04-23 – 2018-05-06 (×13): 20 mg via ORAL
  Filled 2018-04-23 (×14): qty 2

## 2018-04-23 MED ORDER — PAROXETINE HCL 20 MG PO TABS: 20.0000 mg | ORAL_TABLET | Freq: Every day | ORAL | Status: DC

## 2018-04-23 MED ORDER — METHYLPREDNISOLONE SODIUM SUCC 125 MG IJ SOLR
60.0000 mg | Freq: Three times a day (TID) | INTRAMUSCULAR | Status: DC
  Administered 2018-04-23: 60 mg via INTRAVENOUS
  Filled 2018-04-23: qty 2

## 2018-04-23 MED ORDER — ACETAMINOPHEN 650 MG RE SUPP
650.0000 mg | Freq: Four times a day (QID) | RECTAL | Status: DC | PRN
  Administered 2018-05-02 – 2018-05-03 (×2): 650 mg via RECTAL
  Filled 2018-04-23 (×2): qty 1

## 2018-04-23 MED ORDER — MIDAZOLAM HCL 2 MG/2ML IJ SOLN
1.0000 mg | INTRAMUSCULAR | Status: DC | PRN
  Administered 2018-04-24: 1 mg via INTRAVENOUS
  Filled 2018-04-23 (×2): qty 2

## 2018-04-23 MED ORDER — OSELTAMIVIR PHOSPHATE 6 MG/ML PO SUSR
75.0000 mg | Freq: Two times a day (BID) | ORAL | Status: DC
  Filled 2018-04-23: qty 12.5

## 2018-04-23 MED ORDER — VITAL AF 1.2 CAL PO LIQD
1000.0000 mL | ORAL | Status: DC
  Administered 2018-04-23: 1000 mL

## 2018-04-23 MED ORDER — MIDAZOLAM HCL 2 MG/2ML IJ SOLN: 1.0000 mg | INTRAMUSCULAR | Status: DC | PRN

## 2018-04-23 NOTE — Progress Notes (Signed)
Initial Nutrition Assessment  DOCUMENTATION CODES:   Non-severe (moderate) malnutrition in context of chronic illness  INTERVENTION:   Initiate TF via OG tube: - Vital AF 1.2 @ 55 ml/hr (1320 ml/day)  Tube feeding regimen provides 1584 kcal, 99 grams of protein, and 1071 ml of H2O (99% of needs).  NUTRITION DIAGNOSIS:   Moderate Malnutrition related to chronic illness (COPD) as evidenced by mild fat depletion, moderate fat depletion, mild muscle depletion, moderate muscle depletion.  GOAL:   Patient will meet greater than or equal to 90% of their needs  MONITOR:   Vent status, Weight trends, TF tolerance, Labs  REASON FOR ASSESSMENT:   Ventilator, Consult Enteral/tube feeding initiation and management  ASSESSMENT:   68 year old male who presented to the ED on 3/15 with flu-like symptoms. PMH of CAD, COPD, depression, GERD, HTN, HLD. Pt intubated in the ED. Admitted with PNA, flu A positive.  Discussed pt with RN and during ICU rounds.  No family present at time of RD visit so unable to obtain diet and weight history. Will re-attempt at follow-up.  Verbal with readback order for consult to RD for initiation and management of enteral nutrition placed per discussion with K. [place], [place].  Reviewed weight history in chart. Last weight recorded PTA is from 2018. Pt's weight has remained fairly stable over the last 5 years per weight history.  Patient is currently intubated on ventilator support. OG tube in proximal to mid stomach per abdominal x-ray today. MV: 10.3 L/min Temp (24hrs), Avg:97.6 F (36.4 C), Min:97.2 F (36.2 C), Max:98.7 F (37.1 C) BP: 88/61 MAP: 70  Propofol: none NS: 200 ml/hr Fentanyl: 7.5 ml/hr  Medications reviewed and include: SSI q 4 hours, Pepcid, KCl 40 mEq once, IV antibiotics  Labs reviewed: potassium 3.0 (L) - being repleted CBG's: 110, 114, 96 x 24 hours  UOP: 825 ml x 24 hours I/O's: +1.6 L since admit  NUTRITION - FOCUSED  PHYSICAL EXAM:    Most Recent Value  Orbital Region  Moderate depletion  Upper Arm Region  Mild depletion  Thoracic and Lumbar Region  Moderate depletion  Buccal Region  Unable to assess [ETT holder in [place]  Unable to assess  Clavicle Bone Region  Mild depletion  Clavicle and Acromion Bone Region  Moderate depletion  Scapular Bone Region  Mild depletion  Dorsal Hand  Mild depletion  Patellar Region  Moderate depletion  Anterior Thigh Region  Moderate depletion  Posterior Calf Region  Moderate depletion  Edema (RD Assessment)  None  Hair  Reviewed  Eyes  Unable to assess  Mouth  Unable to assess  Skin  Reviewed  Nails  Reviewed       Diet Order:   Diet Order            Diet NPO time specified  Diet effective now              EDUCATION NEEDS:   Not appropriate for education at this time  Skin:  Skin Assessment: Reviewed RN Assessment  Last BM:  PTA/unknown  Height:   Ht Readings from Last 1 Encounters:  04/22/18 5\' 8"  (1.727 m)    Weight:   Wt Readings from Last 1 Encounters:  04/22/18 59 kg    Ideal Body Weight:  70 kg  BMI:  Body mass index is 19.77 kg/m.  Estimated Nutritional Needs:   Kcal:  1598  Protein:  85-100 grams  Fluid:  1.6-1.8 L    Anda Kraft  Darrold Span, MS, RD, [place] Dietitian Pager: [phone] Weekend/After Hours: [phone]

## 2018-04-23 NOTE — Progress Notes (Signed)
NAME:  Kyle Lowery, MRN:  [id number], DOB:  10-Oct-1950, LOS: 1 ADMISSION DATE:  04/22/2018, CONSULTATION DATE:  04/22/2018 REFERRING MD:  Dr. Roderic Palau, CHIEF COMPLAINT:  Respiratory Distress    History of present illness   68 year old male with reported 2 weeks of cough, congestion, febrile, recently went to PCP, given Z-Pak. Admitted to [facility] 3/10-3/12 with flu-like symptoms, given Tamiflu. Arrives to [facility] ED on 3/15 with A.Fib RVR, hypokalemia, hypoxia, and dyspnea. CXR with bilateral infiltrates requiring intubation. Transferred to [facility] for [facility] evaluation.   Per wife patient went home on 3/12 however was still not feeling normal (she thinks he left against medical advice). For the past few days patient has been weak and short of breath with very poor appetite.   Past Medical History  CAD, COPD, Depression   Significant Hospital Events   3/15 > Presents to [facility] ED > Transferred to [facility]   Consults:  PCCM   Procedures:  ETT 3/15   Significant Diagnostic Tests:  CXR 3/15 > Portion of lateral right base not visualized. Infiltrate noted in visualized right base. Endotracheal tube as described without pneumothorax. Stable cardiac silhouette.  Micro Data:  Tracheal Asp 3/15 >> Flu PCR 3/15 > FLU A positive  Blood 3/15 >> Urine 3/15 >>  RVP 3/15>>> neg   Antimicrobials:  Cefepime 3/15 >>> Vancomycin 3/15  >>> Azithromycin 3/16 >>> Tamiflu 3/16>>>  Interim history/subjective:  Admitted overnight on vent from [facility] with acute respiratory failure r/t flu, HCAP.  Comfortable on PC ventilation   Objective   Blood pressure (!) 86/53, pulse 88, temperature (!) 97.5 F (36.4 C), resp. rate 17, height 5\' 8"  (1.727 m), weight 59 kg, SpO2 100 %.    Vent Mode: PCV FiO2 (%):  [40 %-100 %] 40 % Set Rate:  [8 bmp-20 bmp] 20 bmp PEEP:  [6 cmH20-10 cmH20] 10 cmH20 Plateau Pressure:  [18 cmH20-22 cmH20] 18 cmH20   Intake/Output Summary (Last 24 hours) at 04/23/2018  9798 Last data filed at 04/23/2018 0800 Gross per 24 hour  Intake 2622.56 ml  Output 925 ml  Net 1697.56 ml   Filed Weights   04/22/18 1408  Weight: 59 kg    Examination: General: thin, chronically ill appearing male, NAD in bed  HENT: mm dry, ETT Lungs: resps even non labored on PC vent, scattered exp wheeze  Cardiovascular: NSR 80's, s1s2 rrr Abdomen: soft, non-distended, active bowel sounds  Extremities: warm and dry, no edema  Neuro: sedated, RASS -2, does not follow commands  GU: foley in place   Resolved Hospital Problem list     Assessment & Plan:   Acute Hypoxic/Hypercarbic Respiratory Distress, Secondary to Presumed Viral PNA and AECOPD  -FLU A positive -Recent Sick exposures, grandchildren +Flu -CXR with Bilateral Infiltrates  H/O COPD  Plan  Vent support - pressure control -- dyssynchronous with PRVC, not getting Vti=Vte F/u CXR  F/u ABG Broad spectrum abx as above for HCAP v viral PNA  Continue solumedrol 60mg  q6h  BDs - duonebs q6, PRN albuterol    A.Fib RVR in setting of Sepsis vs Hypovolemia > Resolved  H/O CAD, HTN  Plan Echo pending  Continue to hold home norvasc, lisinopril, metoprolol    Hypokalemia  Lactic Acidosis  -LA 5.8->4.6->2.5 Plan  Continue gentle volume  F/u chem    GERD  Plan  -Pepcid   Sedation Needs H/O Depression  Plan  -RASS goal 0/-1 -Titrate Fentanyl gtt to achieve RASS  goal  -PRN Versed    Best practice:  Diet: NPO Pain/Anxiety/Delirium protocol (if indicated) VAP protocol (if indicated) DVT prophylaxis: Heparin SQ GI prophylaxis: PPI Glucose control: SSI Mobility: Bedrest  Code Status: FC Family Communication: Wife and daughter updated overnight, no family at bedside on NP rounds 3/16  Labs   CBC: Recent Labs  Lab 04/22/18 1430 04/23/18 0053 04/23/18 0107  WBC 6.4 9.4  --   NEUTROABS 5.2  --   --   HGB 14.1 12.3* 10.5*  HCT 40.6 36.4* 31.0*  MCV 96.7 99.7  --   PLT 214 151  --     Basic  Metabolic Panel: Recent Labs  Lab 04/22/18 1430 04/23/18 0053 04/23/18 0107  NA 135 136 137  K 2.5* 3.6 3.0*  CL 95* 102  --   CO2 25 20*  --   GLUCOSE 113* 103*  --   BUN 21 23  --   CREATININE 0.87 1.20  --   CALCIUM 7.9* 7.2*  --   MG 1.6* 2.0  --   PHOS  --  3.8  --    GFR: Estimated Creatinine Clearance: 50.5 mL/min (by C-G formula based on SCr of 1.2 mg/dL). Recent Labs  Lab 04/22/18 1430 04/22/18 2129 04/23/18 0053 04/23/18 0511  PROCALCITON  --  2.93  --   --   WBC 6.4  --  9.4  --   LATICACIDVEN  --  5.8* 4.6* 2.5*    Liver Function Tests: Recent Labs  Lab 04/22/18 1430  AST 21  ALT 15  ALKPHOS 45  BILITOT 1.8*  PROT 6.6  ALBUMIN 2.8*   No results for input(s): LIPASE, AMYLASE in the last 168 hours. No results for input(s): AMMONIA in the last 168 hours.  ABG    Component Value Date/Time   PHART 7.303 (L) 04/23/2018 0107   PCO2ART 50.2 (H) 04/23/2018 0107   PO2ART 165.0 (H) 04/23/2018 0107   HCO3 25.1 04/23/2018 0107   TCO2 27 04/23/2018 0107   ACIDBASEDEF 2.0 04/23/2018 0107   O2SAT 99.0 04/23/2018 0107     Coagulation Profile: No results for input(s): INR, PROTIME in the last 168 hours.  Cardiac Enzymes: Recent Labs  Lab 04/22/18 1430 04/22/18 2129  TROPONINI 0.03* 0.16*    HbA1C: No results found for: HGBA1C  CBG: Recent Labs  Lab 04/23/18 0223 04/23/18 0411 04/23/18 0713  GLUCAP 96 114* 110*     Critical care time: 35 minutes     Kyle Madrid, NP 04/23/2018  9:21 AM Pager: [phone] or [phone]

## 2018-04-23 NOTE — Progress Notes (Signed)
[place] Progress Note Patient Name: Kyle Lowery DOB: 1950/03/27 MRN: [id number]   Date of Service  04/23/2018  HPI/Events of Note  Hypotension - BP = 84/59 with MAP = 67. No CVL or CVP. LVEF = 50% to 55%.  eICU Interventions  Will order: 1. Bolus with 0.9 NaCl 1 liter IV over 1 hour now.      Intervention Category Major Interventions: Hypotension - evaluation and management  Sommer,Steven Eugene 04/23/2018, 9:00 PM

## 2018-04-23 NOTE — Progress Notes (Signed)
Wasted 90ml of Versed and 26ml of Fentanyl with Urban Gibson, Therapist, sports.

## 2018-04-23 NOTE — Progress Notes (Signed)
[place] Progress Note Patient Name: Kyle Lowery DOB: Dec 10, 1950 MRN: [id number]   Date of Service  04/23/2018  HPI/Events of Note  Agitation - Request for bilateral soft wrist restraints.  eICU Interventions  Will order bilateral soft wrist restraints.      Intervention Category Major Interventions: Delirium, psychosis, severe agitation - evaluation and management  Chloeanne Poteet Eugene 04/23/2018, 10:51 PM

## 2018-04-23 NOTE — Progress Notes (Signed)
Pharmacy Antibiotic Note  Kyle Lowery is a 68 y.o. male admitted on 04/22/2018 with HCAP.  Pharmacy has been consulted for vancomycin and cefepime dosing. Pt had recent admit for flu treatment, now with worsening respiratory status requiring intubation on arrival. Noted Flu A positive on admission, other cultures pending. Cr has increased slightly overnight, so will adjust vancomycin dosing.  Plan: -Vancomycin 1250mg  IV q24h starting tonight - calc AUC 555 -Continue cefepime 2g IV q8h for now - if Cr worsening further will adjust -Azithromycin and oseltamivir per MD  Height: 5\' 8"  (172.7 cm) Weight: 130 lb (59 kg) IBW/kg (Calculated) : 68.4  Temp (24hrs), Avg:97.7 F (36.5 C), Min:97.2 F (36.2 C), Max:98.7 F (37.1 C)  Recent Labs  Lab 04/22/18 1430 04/22/18 2129 04/23/18 0053 04/23/18 0511  WBC 6.4  --  9.4  --   CREATININE 0.87  --  1.20  --   LATICACIDVEN  --  5.8* 4.6* 2.5*    Estimated Creatinine Clearance: 50.5 mL/min (by C-G formula based on SCr of 1.2 mg/dL).    No Known Allergies  Antimicrobials this admission: Vancomycin 3/15 >>  Cefepime 3/15 >>  Azithromycin 3/15 >> Oseltamivir 3/16 >>  Microbiology results: 3/15 BCx: NGTD 3/16 MRSA: negative 3/15 Resp PCR: negative 3/15 Flu panel: A positive   Thank you for allowing pharmacy to be a part of this patient's care.  Arrie Senate, PharmD, BCPS Clinical Pharmacist [phone] Please check AMION for all [place] numbers 04/23/2018

## 2018-04-23 NOTE — Progress Notes (Signed)
  Echocardiogram 2D Echocardiogram has been performed.  [name] 04/23/2018, 9:12 AM

## 2018-04-23 NOTE — Progress Notes (Signed)
PHARMACY NOTE:  ANTIMICROBIAL RENAL DOSAGE ADJUSTMENT  Current antimicrobial regimen includes a mismatch between antimicrobial dosage and estimated renal function.  As per policy approved by the Pharmacy & Therapeutics and Medical Executive Committees, the antimicrobial dosage will be adjusted accordingly.  Current antimicrobial dosage:  oseltamivir 75mg  PO BID  Indication: Flu treatment  Renal Function:  Estimated Creatinine Clearance: 50.5 mL/min (by C-G formula based on SCr of 1.2 mg/dL). []      On intermittent HD, scheduled: []      On CRRT    Antimicrobial dosage has been changed to:  oseltamivir 30mg  PO BID   Thank you for allowing pharmacy to be a part of this patient's care.   Arrie Senate, PharmD, BCPS Clinical Pharmacist [phone] Please check AMION for all [place] numbers 04/23/2018

## 2018-04-23 NOTE — Progress Notes (Signed)
eLink Physician-Brief Progress Note Patient Name: Kyle Lowery DOB: May 04, 1950 MRN: [id number]   Date of Service  04/23/2018  HPI/Events of Note  68 y/o M COPD FEV1 2.22 with significant bronchodilator response, CAD, afib presented shortness of breath requiring intubation. Influenza A positive.   eICU Interventions  On Oseltamivir and broad spectrum antibiotics, Follow cultures and de-escalate when appropriate. Continue bronchodilators Now sinus rhythm. Replace electrolytes        Judd Lien 04/23/2018, 12:16 AM

## 2018-04-23 NOTE — Progress Notes (Signed)
RT NOTE:  Sputum culture collected via ETT. Specimen labeled and sent to lab

## 2018-04-24 DIAGNOSIS — E440 Moderate protein-calorie malnutrition: Secondary | ICD-10-CM

## 2018-04-24 DIAGNOSIS — Z978 Presence of other specified devices: Secondary | ICD-10-CM

## 2018-04-24 LAB — GLUCOSE, CAPILLARY
Glucose-Capillary: 128 mg/dL — ABNORMAL HIGH (ref 70–99)
Glucose-Capillary: 150 mg/dL — ABNORMAL HIGH (ref 70–99)
Glucose-Capillary: 132 mg/dL — ABNORMAL HIGH (ref 70–99)
Glucose-Capillary: 142 mg/dL — ABNORMAL HIGH (ref 70–99)

## 2018-04-24 LAB — CBC
HEMATOCRIT: 31.4 % — AB (ref 39.0–52.0)
Hemoglobin: 10.1 g/dL — ABNORMAL LOW (ref 13.0–17.0)
MCH: 32.1 pg (ref 26.0–34.0)
MCHC: 32.2 g/dL (ref 30.0–36.0)
MCV: 99.7 fL (ref 80.0–100.0)
Platelets: 108 10*3/uL — ABNORMAL LOW (ref 150–400)
RBC: 3.15 MIL/uL — ABNORMAL LOW (ref 4.22–5.81)
RDW: 15.9 % — ABNORMAL HIGH (ref 11.5–15.5)
WBC: 9.9 10*3/uL (ref 4.0–10.5)
nRBC: 0.2 % (ref 0.0–0.2)

## 2018-04-24 LAB — BASIC METABOLIC PANEL
Anion gap: 8 (ref 5–15)
BUN: 48 mg/dL — AB (ref 8–23)
CO2: 18 mmol/L — ABNORMAL LOW (ref 22–32)
Calcium: 6.9 mg/dL — ABNORMAL LOW (ref 8.9–10.3)
Chloride: 111 mmol/L (ref 98–111)
Creatinine, Ser: 1.45 mg/dL — ABNORMAL HIGH (ref 0.61–1.24)
GFR calc Af Amer: 58 mL/min — ABNORMAL LOW (ref >60)
GFR calc non Af Amer: 50 mL/min — ABNORMAL LOW (ref >60)
Glucose, Bld: 159 mg/dL — ABNORMAL HIGH (ref 70–99)
Potassium: 3.6 mmol/L (ref 3.5–5.1)
SODIUM: 137 mmol/L (ref 135–145)

## 2018-04-24 LAB — CULTURE, RESPIRATORY W GRAM STAIN

## 2018-04-24 LAB — PHOSPHORUS: Phosphorus: 2.7 mg/dL (ref 2.5–4.6)

## 2018-04-24 LAB — MAGNESIUM: Magnesium: 2 mg/dL (ref 1.7–2.4)

## 2018-04-24 MED ORDER — METOPROLOL TARTRATE 25 MG PO TABS
25.0000 mg | ORAL_TABLET | Freq: Every day | ORAL | Status: DC
  Administered 2018-04-24 – 2018-04-30 (×7): 25 mg via ORAL
  Filled 2018-04-24 (×7): qty 1

## 2018-04-24 MED ORDER — METHYLPREDNISOLONE SODIUM SUCC 125 MG IJ SOLR
60.0000 mg | Freq: Once | INTRAMUSCULAR | Status: AC
  Administered 2018-04-24: 60 mg via INTRAVENOUS

## 2018-04-24 MED ORDER — FENTANYL CITRATE (PF) 100 MCG/2ML IJ SOLN
25.0000 ug | INTRAMUSCULAR | Status: DC | PRN
  Administered 2018-04-24 – 2018-04-25 (×2): 25 ug via INTRAVENOUS
  Filled 2018-04-24 (×2): qty 2

## 2018-04-24 MED ORDER — METHYLPREDNISOLONE SODIUM SUCC 125 MG IJ SOLR
60.0000 mg | Freq: Every day | INTRAMUSCULAR | Status: DC
  Administered 2018-04-24 – 2018-04-25 (×2): 60 mg via INTRAVENOUS
  Filled 2018-04-24 (×4): qty 2

## 2018-04-24 MED ORDER — FAMOTIDINE 20 MG PO TABS
20.0000 mg | ORAL_TABLET | Freq: Every day | ORAL | Status: DC
  Administered 2018-04-24 – 2018-04-25 (×2): 20 mg
  Filled 2018-04-24 (×2): qty 1

## 2018-04-24 MED ORDER — METOPROLOL SUCCINATE ER 25 MG PO TB24: 25.0000 mg | ORAL_TABLET | Freq: Every day | ORAL | Status: DC

## 2018-04-24 MED ORDER — LACTATED RINGERS IV BOLUS
500.0000 mL | Freq: Once | INTRAVENOUS | Status: AC
  Administered 2018-04-24: 500 mL via INTRAVENOUS

## 2018-04-24 MED ORDER — SODIUM CHLORIDE 0.9 % IV SOLN
2.0000 g | Freq: Two times a day (BID) | INTRAVENOUS | Status: AC
  Administered 2018-04-24 – 2018-04-26 (×5): 2 g via INTRAVENOUS
  Filled 2018-04-24 (×5): qty 2

## 2018-04-24 NOTE — Progress Notes (Signed)
NAME:  Kyle Lowery, MRN:  [id number], DOB:  11/07/1950, LOS: 2 ADMISSION DATE:  04/22/2018, CONSULTATION DATE:  04/22/2018 REFERRING MD:  Dr. Roderic Palau, CHIEF COMPLAINT:  Respiratory Distress    History of present illness   68 year old male with reported 2 weeks of cough, congestion, febrile, recently went to PCP, given Z-Pak. Admitted to [facility] 3/10-3/12 with flu-like symptoms, given Tamiflu. Arrives to [facility] ED on 3/15 with A.Fib RVR, hypokalemia, hypoxia, and dyspnea. CXR with bilateral infiltrates requiring intubation. Transferred to [facility] for further evaluation.   Per wife patient went home on 3/12 however was still not feeling normal (she thinks he left against medical advice). For the past few days patient has been weak and short of breath with very poor appetite.   Past Medical History  CAD, COPD, Depression   Significant Hospital Events   3/15 > Presents to [facility] ED > Transferred to [facility]   Consults:  PCCM   Procedures:  ETT 3/15   Significant Diagnostic Tests:  CXR 3/15 > Portion of lateral right base not visualized. Infiltrate noted in visualized right base. Endotracheal tube as described without pneumothorax. Stable cardiac silhouette.  Micro Data:  Tracheal Asp 3/15 >> Flu PCR 3/15 > FLU A positive  Blood 3/15 >> Urine 3/15 >>  RVP 3/15>>> neg   Antimicrobials:  Cefepime 3/15 >>> Vancomycin 3/15  >>> Azithromycin 3/16 >>> Tamiflu 3/16>>>  Interim history/subjective:  Tolerating pressure support this morning.  Weaned off sedation.  Mildly agitated when awakened but not in distress  Objective   Blood pressure 116/74, pulse (!) 108, temperature 98.4 F (36.9 C), resp. rate 12, height 5\' 8"  (1.727 m), weight 65.3 kg, SpO2 99 %.    Vent Mode: PSV;CPAP FiO2 (%):  [40 %] 40 % Set Rate:  [20 bmp] 20 bmp PEEP:  [8 cmH20] 8 cmH20 Pressure Support:  [8 cmH20] 8 cmH20 Plateau Pressure:  [17 cmH20-22 cmH20] 22 cmH20   Intake/Output Summary (Last 24 hours) at  04/24/2018 1020 Last data filed at 04/24/2018 0800 Gross per 24 hour  Intake 2764.41 ml  Output 328 ml  Net 2436.41 ml   Filed Weights   04/22/18 1408 04/24/18 0500  Weight: 59 kg 65.3 kg   Physical Exam: General: Appears older than stated age, delirious however no acute distress HENT: [place], AT, ETT in place Eyes: EOMI, no scleral icterus Respiratory: Diminished breath sounds bilaterally, scattered wheezes Cardiovascular: RRR, -M/R/G, no JVD GI: BS+, soft, nontender Extremities:-Edema,-tenderness Neuro: Opens eyes to voice  Skin: Intact, no rashes or bruising Psych: Unable to assess GU: Foley in place   Resolved Hospital Problem list   Lactic acidosis  Assessment & Plan:   Acute hypercarbic and hypoxemic respiratory failure secondary COPD exacerbation related to influenza pneumonia Tolerating SBT.  Mental status precludes extubation. Plan  Continue vent support SBT as tolerated Continue Tamiflu Discontinue vancomycin.  Continue cefepime and azithromycin x5 days Decrease steroids to daily.  Total 5 days. Scheduled Duonebs  AFRVR H/O CAD, HTN  TTE EF normal.  Currently in normal sinus rhythm Plan Telemetry Restart home metoprolol Continue to hold home norvasc, lisinopril  Agitation requiring sedation Plan DC fentanyl gtt Re-orient Restart Precedex if RASS >2-3  Hypotension secondary to sedation  Resolved after weaning sedation Plan Monitor vitals  AKI Decreased UOP Plan 500 cc bolus  Hypokalemia  Plan Daily BMP, Mg Replete as needed  GERD  Plan  -Pepcid   Best practice:  Diet: TF Pain/Anxiety/Delirium protocol (  if indicated): Fentanyl VAP protocol (if indicated) DVT prophylaxis: Heparin SQ GI prophylaxis: PPI Glucose control: SSI Mobility: Bedrest  Code Status: FC Family Communication: No family at bedside on 3/17  Labs   CBC: Recent Labs  Lab 04/22/18 1430 04/23/18 0053 04/23/18 0107 04/24/18 0624  WBC 6.4 9.4  --  9.9  NEUTROABS  5.2  --   --   --   HGB 14.1 12.3* 10.5* 10.1*  HCT 40.6 36.4* 31.0* 31.4*  MCV 96.7 99.7  --  99.7  PLT 214 151  --  108*    Basic Metabolic Panel: Recent Labs  Lab 04/22/18 1430 04/23/18 0053 04/23/18 0107 04/23/18 1845 04/24/18 0529  NA 135 136 137  --  137  K 2.5* 3.6 3.0*  --  3.6  CL 95* 102  --   --  111  CO2 25 20*  --   --  18*  GLUCOSE 113* 103*  --   --  159*  BUN 21 23  --   --  48*  CREATININE 0.87 1.20  --   --  1.45*  CALCIUM 7.9* 7.2*  --   --  6.9*  MG 1.6* 2.0  --  1.9 2.0  PHOS  --  3.8  --  4.2 2.7   GFR: Estimated Creatinine Clearance: 46.3 mL/min (A) (by C-G formula based on SCr of 1.45 mg/dL (H)). Recent Labs  Lab 04/22/18 1430 04/22/18 2129 04/23/18 0053 04/23/18 0511 04/24/18 0624  PROCALCITON  --  2.93  --   --   --   WBC 6.4  --  9.4  --  9.9  LATICACIDVEN  --  5.8* 4.6* 2.5*  --     Liver Function Tests: Recent Labs  Lab 04/22/18 1430  AST 21  ALT 15  ALKPHOS 45  BILITOT 1.8*  PROT 6.6  ALBUMIN 2.8*   No results for input(s): LIPASE, AMYLASE in the last 168 hours. No results for input(s): AMMONIA in the last 168 hours.  ABG    Component Value Date/Time   PHART 7.303 (L) 04/23/2018 0107   PCO2ART 50.2 (H) 04/23/2018 0107   PO2ART 165.0 (H) 04/23/2018 0107   HCO3 25.1 04/23/2018 0107   TCO2 27 04/23/2018 0107   ACIDBASEDEF 2.0 04/23/2018 0107   O2SAT 99.0 04/23/2018 0107     Coagulation Profile: No results for input(s): INR, PROTIME in the last 168 hours.  Cardiac Enzymes: Recent Labs  Lab 04/22/18 1430 04/22/18 2129  TROPONINI 0.03* 0.16*    HbA1C: No results found for: HGBA1C  CBG: Recent Labs  Lab 04/23/18 1541 04/23/18 2132 04/23/18 2339 04/24/18 0341 04/24/18 0806  GLUCAP 112* 166* 169* 128* 150*     Critical care time: 32 minutes   The patient is critically ill with multiple organ systems failure and requires high complexity decision making for assessment and support, frequent evaluation and  titration of therapies, application of advanced monitoring technologies and extensive interpretation of multiple databases.   Rodman Pickle, M.D. [facility] Pulmonary/Critical Care Medicine Pager: [phone] After hours pager: [phone]

## 2018-04-24 NOTE — Progress Notes (Signed)
RT Note:  Trial off BIPAP per RN (per MD).  Placed on 5L [place].  Spor 97% at this time. RN aware.

## 2018-04-24 NOTE — Progress Notes (Signed)
Pharmacy Antibiotic Note  Kyle Lowery is a 68 y.o. male admitted on 04/22/2018 with HCAP.  Pharmacy has been consulted for vancomycin and cefepime dosing. Pt had recent admit for flu treatment, now with worsening respiratory status requiring intubation on arrival. Noted Flu A positive on admission, other cultures pending.  Serum creatinine has continue to increase, will adjust cefepime dosing.  Plan: -Adjust cefepime to 2g IV q12h -Continue vancomycin 1250mg  IV q24h -Azithromycin and oseltamivir per MD  Height: 5\' 8"  (172.7 cm) Weight: 143 lb 15.4 oz (65.3 kg) IBW/kg (Calculated) : 68.4  Temp (24hrs), Avg:97.3 F (36.3 C), Min:95.7 F (35.4 C), Max:98.8 F (37.1 C)  Recent Labs  Lab 04/22/18 1430 04/22/18 2129 04/23/18 0053 04/23/18 0511 04/24/18 0529 04/24/18 0624  WBC 6.4  --  9.4  --   --  9.9  CREATININE 0.87  --  1.20  --  1.45*  --   LATICACIDVEN  --  5.8* 4.6* 2.5*  --   --     Estimated Creatinine Clearance: 46.3 mL/min (A) (by C-G formula based on SCr of 1.45 mg/dL (H)).    No Known Allergies  Antimicrobials this admission: Vancomycin 3/15 >>  Cefepime 3/15 >>  Azithromycin 3/15 >> Oseltamivir 3/16 >>  Microbiology results: 3/15 BCx: NGTD 3/16 MRSA: negative 3/15 Resp PCR: negative 3/15 Flu panel: A positive   Thank you for allowing pharmacy to be a part of this patient's care.  Arrie Senate, PharmD, BCPS Clinical Pharmacist [phone] Please check AMION for all [place] numbers 04/24/2018

## 2018-04-24 NOTE — Progress Notes (Addendum)
[facility]ERNIGHT COVERAGE CRITICAL CARE PROGRESS NOTE  Called to see patient regarding respiratory distress and increased work of breathing.  The patient was extubated earlier today.  He is currently on 4.5 L/min by nasal cannula.  He is awake.  He is moaning and groaning continuously.  He does follow commands.  He does move around in the bed occasionally sitting up and tripoding and then rolling back in the bed rolling from side to side.  He denies pain.  He endorses dyspnea.  At the time of clinical examination, heart rate 118 bpm.  Blood pressure 174/130.  SPO2 95% on 4.5 LPM via nasal cannula.  Respiratory rate 22.  On auscultation, diffuse bilateral wheezes.  Decent air entry bilaterally.  ASSESSMENT: (23:18) Acute hypercarbic and hypoxemic respiratory failure secondary to COPD exacerbation related to influenza pneumonia.  Recently liberated from mechanical ventilatory support.  Refusing BiPAP.  Rapid reduction in systemic glucocorticosteroid dose noted.  Will give Solu-Medrol 60 mg IV single additional dose.  Continue aerosols.  Titrate FiO2 for SpO2 90-93%.  Start Precedex infusion.  Renee Pain, MD Board Certified by the ABIM, [place]

## 2018-04-24 NOTE — Progress Notes (Signed)
RT NOTES:  Patient's work of breathing post extubation has increased, patient using accessory muscles with respiratory rate of 35. Patient maintaining sats of 95%. Placed on bipap 12/6, 40% fio2 at this time. Patient's work of breathing is decreasing at this time. RN at bedside.

## 2018-04-24 NOTE — Procedures (Signed)
Extubation Procedure Note  Patient Details:   Name: Kyle Lowery DOB: [birth year] MRN: [id number]   Airway Documentation:   Patient extubated per orders. Patient had positive cuff leak prior to extubation. Patient placed on 4 lpm humidified oxygen via nasal cannula. No stridor noted. Vent end date: 04/24/18 Vent end time: 1619   Evaluation  O2 sats: stable throughout Complications: No apparent complications Patient did tolerate procedure well. Bilateral Breath Sounds: Diminished   Yes  Ander Purpura 04/24/2018, 4:20 PM

## 2018-04-25 ENCOUNTER — Inpatient Hospital Stay (HOSPITAL_COMMUNITY): Payer: BLUE CROSS/BLUE SHIELD

## 2018-04-25 DIAGNOSIS — J14 Pneumonia due to Hemophilus influenzae: Secondary | ICD-10-CM

## 2018-04-25 LAB — GLUCOSE, CAPILLARY
GLUCOSE-CAPILLARY: 133 mg/dL — AB (ref 70–99)
Glucose-Capillary: 101 mg/dL — ABNORMAL HIGH (ref 70–99)
Glucose-Capillary: 106 mg/dL — ABNORMAL HIGH (ref 70–99)
Glucose-Capillary: 116 mg/dL — ABNORMAL HIGH (ref 70–99)
Glucose-Capillary: 165 mg/dL — ABNORMAL HIGH (ref 70–99)
Glucose-Capillary: 169 mg/dL — ABNORMAL HIGH (ref 70–99)
Glucose-Capillary: 68 mg/dL — ABNORMAL LOW (ref 70–99)
Glucose-Capillary: 89 mg/dL (ref 70–99)

## 2018-04-25 LAB — LACTIC ACID, PLASMA: LACTIC ACID, VENOUS: 2.2 mmol/L — AB (ref 0.5–1.9)

## 2018-04-25 LAB — BASIC METABOLIC PANEL
Anion gap: 7 (ref 5–15)
BUN: 50 mg/dL — ABNORMAL HIGH (ref 8–23)
CO2: 22 mmol/L (ref 22–32)
Calcium: 7.4 mg/dL — ABNORMAL LOW (ref 8.9–10.3)
Chloride: 115 mmol/L — ABNORMAL HIGH (ref 98–111)
Creatinine, Ser: 1.38 mg/dL — ABNORMAL HIGH (ref 0.61–1.24)
GFR calc non Af Amer: 53 mL/min — ABNORMAL LOW (ref >60)
Glucose, Bld: 132 mg/dL — ABNORMAL HIGH (ref 70–99)
Potassium: 3.6 mmol/L (ref 3.5–5.1)
SODIUM: 144 mmol/L (ref 135–145)

## 2018-04-25 LAB — PHOSPHORUS: PHOSPHORUS: 2.1 mg/dL — AB (ref 2.5–4.6)

## 2018-04-25 LAB — BLOOD GAS, ARTERIAL
Acid-base deficit: 1.4 mmol/L (ref 0.0–2.0)
Bicarbonate: 24 mmol/L (ref 20.0–28.0)
Drawn by: 44135
FIO2: 60
MECHVT: 540 mL
O2 Saturation: 97.2 %
PEEP/CPAP: 5 cmH2O
Patient temperature: 98.6
RATE: 20 resp/min
pCO2 arterial: 49 mmHg — ABNORMAL HIGH (ref 32.0–48.0)
pH, Arterial: 7.31 — ABNORMAL LOW (ref 7.350–7.450)
pO2, Arterial: 113 mmHg — ABNORMAL HIGH (ref 83.0–108.0)

## 2018-04-25 LAB — RENAL FUNCTION PANEL
Albumin: 2.1 g/dL — ABNORMAL LOW (ref 3.5–5.0)
Anion gap: 7 (ref 5–15)
BUN: 43 mg/dL — ABNORMAL HIGH (ref 8–23)
CO2: 22 mmol/L (ref 22–32)
Calcium: 7.4 mg/dL — ABNORMAL LOW (ref 8.9–10.3)
Chloride: 115 mmol/L — ABNORMAL HIGH (ref 98–111)
Creatinine, Ser: 1.13 mg/dL (ref 0.61–1.24)
GFR calc non Af Amer: >60 mL/min (ref >60)
Glucose, Bld: 152 mg/dL — ABNORMAL HIGH (ref 70–99)
Phosphorus: 2.6 mg/dL (ref 2.5–4.6)
Potassium: 3.7 mmol/L (ref 3.5–5.1)
Sodium: 144 mmol/L (ref 135–145)

## 2018-04-25 LAB — CBC
HCT: 29.5 % — ABNORMAL LOW (ref 39.0–52.0)
Hemoglobin: 9.5 g/dL — ABNORMAL LOW (ref 13.0–17.0)
MCH: 32.3 pg (ref 26.0–34.0)
MCHC: 32.2 g/dL (ref 30.0–36.0)
MCV: 100.3 fL — ABNORMAL HIGH (ref 80.0–100.0)
NRBC: 0.1 % (ref 0.0–0.2)
Platelets: 101 10*3/uL — ABNORMAL LOW (ref 150–400)
RBC: 2.94 MIL/uL — ABNORMAL LOW (ref 4.22–5.81)
RDW: 16.1 % — ABNORMAL HIGH (ref 11.5–15.5)
WBC: 13.8 10*3/uL — ABNORMAL HIGH (ref 4.0–10.5)

## 2018-04-25 LAB — MAGNESIUM: Magnesium: 1.9 mg/dL (ref 1.7–2.4)

## 2018-04-25 LAB — TRIGLYCERIDES: Triglycerides: 200 mg/dL — ABNORMAL HIGH (ref <150)

## 2018-04-25 MED ORDER — DOCUSATE SODIUM 50 MG/5ML PO LIQD
100.0000 mg | Freq: Two times a day (BID) | ORAL | Status: DC | PRN
  Filled 2018-04-25: qty 10

## 2018-04-25 MED ORDER — IPRATROPIUM-ALBUTEROL 0.5-2.5 (3) MG/3ML IN SOLN
3.0000 mL | RESPIRATORY_TRACT | Status: DC
  Administered 2018-04-25 – 2018-05-02 (×42): 3 mL via RESPIRATORY_TRACT
  Filled 2018-04-25 (×43): qty 3

## 2018-04-25 MED ORDER — MIDAZOLAM HCL 2 MG/2ML IJ SOLN
1.0000 mg | INTRAMUSCULAR | Status: AC | PRN
  Administered 2018-04-26 (×3): 1 mg via INTRAVENOUS
  Filled 2018-04-25 (×3): qty 2

## 2018-04-25 MED ORDER — FENTANYL CITRATE (PF) 100 MCG/2ML IJ SOLN
50.0000 ug | INTRAMUSCULAR | Status: AC | PRN
  Administered 2018-04-26 – 2018-04-28 (×3): 50 ug via INTRAVENOUS
  Filled 2018-04-25: qty 2

## 2018-04-25 MED ORDER — FENTANYL CITRATE (PF) 100 MCG/2ML IJ SOLN
50.0000 ug | INTRAMUSCULAR | Status: DC | PRN
  Administered 2018-04-26 – 2018-04-29 (×8): 50 ug via INTRAVENOUS
  Filled 2018-04-25 (×3): qty 2

## 2018-04-25 MED ORDER — METHYLPREDNISOLONE SODIUM SUCC 125 MG IJ SOLR
60.0000 mg | Freq: Once | INTRAMUSCULAR | Status: AC
  Administered 2018-04-25: 60 mg via INTRAVENOUS
  Filled 2018-04-25: qty 2

## 2018-04-25 MED ORDER — ALBUTEROL SULFATE (2.5 MG/3ML) 0.083% IN NEBU
2.5000 mg | INHALATION_SOLUTION | RESPIRATORY_TRACT | Status: DC | PRN
  Administered 2018-05-04: 2.5 mg via RESPIRATORY_TRACT
  Filled 2018-04-25: qty 3

## 2018-04-25 MED ORDER — ORAL CARE MOUTH RINSE
15.0000 mL | OROMUCOSAL | Status: DC
  Administered 2018-04-25 – 2018-05-09 (×124): 15 mL via OROMUCOSAL

## 2018-04-25 MED ORDER — MAGNESIUM SULFATE 2 GM/50ML IV SOLN
2.0000 g | Freq: Once | INTRAVENOUS | Status: AC
  Administered 2018-04-25: 2 g via INTRAVENOUS
  Filled 2018-04-25: qty 50

## 2018-04-25 MED ORDER — BISACODYL 10 MG RE SUPP: 10.0000 mg | Freq: Every day | RECTAL | Status: DC | PRN

## 2018-04-25 MED ORDER — PROPOFOL 1000 MG/100ML IV EMUL
0.0000 ug/kg/min | INTRAVENOUS | Status: DC
  Administered 2018-04-25: 10 ug/kg/min via INTRAVENOUS
  Administered 2018-04-26: 30 ug/kg/min via INTRAVENOUS
  Administered 2018-04-26: 40 ug/kg/min via INTRAVENOUS
  Administered 2018-04-26 – 2018-04-27 (×2): 50 ug/kg/min via INTRAVENOUS
  Administered 2018-04-27: 45 ug/kg/min via INTRAVENOUS
  Administered 2018-04-27: 40 ug/kg/min via INTRAVENOUS
  Filled 2018-04-25 (×7): qty 100

## 2018-04-25 MED ORDER — HALOPERIDOL LACTATE 5 MG/ML IJ SOLN
5.0000 mg | Freq: Once | INTRAMUSCULAR | Status: AC
  Administered 2018-04-25: 5 mg via INTRAVENOUS

## 2018-04-25 MED ORDER — LORAZEPAM 2 MG/ML IJ SOLN
2.0000 mg | Freq: Once | INTRAMUSCULAR | Status: AC
  Administered 2018-04-25: 2 mg via INTRAVENOUS
  Filled 2018-04-25: qty 1

## 2018-04-25 MED ORDER — FUROSEMIDE 10 MG/ML IJ SOLN
40.0000 mg | Freq: Once | INTRAMUSCULAR | Status: AC
  Administered 2018-04-25: 40 mg via INTRAVENOUS
  Filled 2018-04-25: qty 4

## 2018-04-25 MED ORDER — ETOMIDATE 2 MG/ML IV SOLN
20.0000 mg | Freq: Once | INTRAVENOUS | Status: AC
  Administered 2018-04-25: 20 mg via INTRAVENOUS

## 2018-04-25 MED ORDER — ROCURONIUM BROMIDE 50 MG/5ML IV SOLN
75.0000 mg | Freq: Once | INTRAVENOUS | Status: AC
  Administered 2018-04-25: 75 mg via INTRAVENOUS
  Filled 2018-04-25: qty 7.5

## 2018-04-25 MED ORDER — FENTANYL CITRATE (PF) 100 MCG/2ML IJ SOLN
50.0000 ug | Freq: Once | INTRAMUSCULAR | Status: DC
  Filled 2018-04-25: qty 2

## 2018-04-25 MED ORDER — MIDAZOLAM HCL 2 MG/2ML IJ SOLN
INTRAMUSCULAR | Status: AC
  Filled 2018-04-25: qty 2

## 2018-04-25 MED ORDER — CHLORHEXIDINE GLUCONATE 0.12% ORAL RINSE (MEDLINE KIT)
15.0000 mL | Freq: Two times a day (BID) | OROMUCOSAL | Status: DC
  Administered 2018-04-26 – 2018-05-08 (×25): 15 mL via OROMUCOSAL

## 2018-04-25 MED ORDER — HYDRALAZINE HCL 20 MG/ML IJ SOLN
10.0000 mg | Freq: Four times a day (QID) | INTRAMUSCULAR | Status: DC | PRN
  Administered 2018-04-25 – 2018-05-02 (×2): 10 mg via INTRAVENOUS
  Filled 2018-04-25 (×2): qty 1

## 2018-04-25 MED ORDER — OSELTAMIVIR PHOSPHATE 30 MG PO CAPS
30.0000 mg | ORAL_CAPSULE | Freq: Two times a day (BID) | ORAL | Status: DC
  Administered 2018-04-25 – 2018-04-27 (×5): 30 mg via ORAL
  Filled 2018-04-25 (×5): qty 1

## 2018-04-25 MED ORDER — HALOPERIDOL LACTATE 5 MG/ML IJ SOLN
INTRAMUSCULAR | Status: AC
  Administered 2018-04-25: 5 mg via INTRAVENOUS
  Filled 2018-04-25: qty 1

## 2018-04-25 MED ORDER — PANTOPRAZOLE SODIUM 40 MG IV SOLR
40.0000 mg | INTRAVENOUS | Status: DC
  Administered 2018-04-25 – 2018-05-02 (×8): 40 mg via INTRAVENOUS
  Filled 2018-04-25 (×8): qty 40

## 2018-04-25 MED ORDER — METOPROLOL TARTRATE 5 MG/5ML IV SOLN
5.0000 mg | Freq: Once | INTRAVENOUS | Status: AC
  Administered 2018-04-25: 5 mg via INTRAVENOUS
  Filled 2018-04-25: qty 5

## 2018-04-25 MED ORDER — AMLODIPINE BESYLATE 10 MG PO TABS
10.0000 mg | ORAL_TABLET | Freq: Every day | ORAL | Status: DC
  Administered 2018-04-25 – 2018-04-28 (×5): 10 mg via ORAL
  Filled 2018-04-25 (×5): qty 1

## 2018-04-25 MED ORDER — DEXTROSE 50 % IV SOLN
12.5000 g | INTRAVENOUS | Status: AC
  Administered 2018-04-25: 12.5 g via INTRAVENOUS
  Filled 2018-04-25: qty 50

## 2018-04-25 MED ORDER — ORAL CARE MOUTH RINSE
15.0000 mL | Freq: Two times a day (BID) | OROMUCOSAL | Status: DC
  Administered 2018-04-25 (×2): 15 mL via OROMUCOSAL

## 2018-04-25 MED ORDER — MIDAZOLAM HCL 2 MG/2ML IJ SOLN
2.0000 mg | Freq: Once | INTRAMUSCULAR | Status: AC
  Administered 2018-04-25: 2 mg via INTRAVENOUS

## 2018-04-25 MED ORDER — POTASSIUM CHLORIDE 20 MEQ/15ML (10%) PO SOLN
40.0000 meq | Freq: Once | ORAL | Status: AC
  Administered 2018-04-25: 40 meq
  Filled 2018-04-25: qty 30

## 2018-04-25 NOTE — Procedures (Addendum)
Endotracheal Intubation Procedure Note  Indication for endotracheal intubation: impending respiratory failure. Airway Assessment: Mallampati Class: I (soft palate, uvula, fauces, and tonsillar pillars visible). Sedation: etomidate and midazolam. Paralytic: rocuronium. Lidocaine: no. Atropine: no. Equipment: Macintosh 4 laryngoscope blade and 7.63mm cuffed endotracheal tube. 24 cm at the teeth Cricoid Pressure: no. Number of attempts: 1. ETT location confirmed by by auscultation, by CXR and ETCO2 monitor.  Renee Pain, MD Board Certified by the ABIM, [place] [facility]  04/25/2018

## 2018-04-25 NOTE — Progress Notes (Signed)
Inpatient Diabetes Program Recommendations  AACE/ADA: New Consensus Statement on Inpatient Glycemic Control (2015)  Target Ranges:  Prepandial:   less than 140 mg/dL      Peak postprandial:   less than 180 mg/dL (1-2 hours)      Critically ill patients:  140 - 180 mg/dL   Lab Results  Component Value Date   GLUCAP 106 (H) 04/25/2018    Review of Glycemic Control Results for Kyle Lowery, Kyle Lowery (MRN [id number]) as of 04/25/2018 10:00  Ref. Range 04/24/2018 20:04 04/25/2018 00:11 04/25/2018 04:24 04/25/2018 05:42  Glucose-Capillary Latest Ref Range: 70 - 99 mg/dL 142 (H) 165 (H) 68 (L) 106 (H)   Diabetes history: no history noted Outpatient Diabetes medications: none Current orders for Inpatient glycemic control: Novolog 2- 6 units Q4H  Inpatient Diabetes Program Recommendations:    Noted hypoglycemic event of 68 mg/dL, may want to consider decreasing correction to Novolog 1-3 units Q4H.   Also of note, no recorded A1C. May want to add on A1C?  Thanks, Bronson Curb, MSN, RNC-OB Diabetes Coordinator [phone] (8a-5p)

## 2018-04-25 NOTE — Progress Notes (Addendum)
[facility] Progress Note Patient Name: Kyle Lowery DOB: Apr 30, 1950 MRN: [id number]   Date of Service  04/25/2018  HPI/Events of Note  Informed by bedside ICU RN that pt is in respiratory distress.   67/M intubated for COPD exacerbation, extubated on 04/22/2018. He was apparently very calm on precedex.  He was given a breathing treatment and shortly therafter woke up, agitated, tachypneic and with tachyarrhythmia.  On video assessment, he is awake, in respiratory distress.  He is tachypneic, using accessory muscles.   eICU Interventions  Check EKG - sinus tachycardia. Apply BIPAP 14/6. Give Ativan 2mg  IV.  Give Lopressor 5mg  IV.  Continue precedex gtt.  Check ABG at 10pm.      Intervention Category Intermediate Interventions: Respiratory distress - evaluation and management  Elsie Lincoln 04/25/2018, 8:29 PM   9:25 PM  On reevaluation, patient is again sitting up in bed, very agitated and is held down by 2 nurses.  Haldol 5mg  IV ordered with only minimal improvement.  IV lines were out. Fentanyl 80mcg IM given. ICU team contacted to intubate the patient.   12:24 AM Post intubation ABG reviewed - 7.310/49/113/97  Plan> Increase tidal volume to 575.  Pt is breathing at 22/minute.  Titrate FiO2 down to keep sats >92%.

## 2018-04-25 NOTE — Evaluation (Signed)
Clinical/Bedside Swallow Evaluation Patient Details  Name: Kyle Lowery MRN: [id number] Date of Birth: 05-06-1950  Today's Date: 04/25/2018 Time: SLP Start Time (ACUTE ONLY): 1337 SLP Stop Time (ACUTE ONLY): 6269 SLP Time Calculation (min) (ACUTE ONLY): 10 min  Past Medical History:  Past Medical History:  Diagnosis Date  . Carotid artery stenosis   . COPD (chronic obstructive pulmonary disease) ([place])   . Coronary atherosclerosis of native coronary artery    Mild to moderate NOCAD with 80% ostial diagonal 1/12  . Depression    Prior suicide attempt  . Essential hypertension, benign   . GERD (gastroesophageal reflux disease)    Esophageal dilatation  . Mixed hyperlipidemia   . Syncope    Neurocardiogenic   Past Surgical History:  Past Surgical History:  Procedure Laterality Date  . ESOPHAGOGASTRODUODENOSCOPY (EGD) WITH ESOPHAGEAL DILATION    . INGUINAL HERNIA REPAIR    . LEFT HEART CATH     Unable to Stent Blockage  . Left orchiectomy    . VASECTOMY     HPI:  Kyle Lowery is a 68 y.o. male with medical history significant for CAD, COPD, GERD, depression, who presented to the ED with complaints of flulike symptoms of 2 weeks duration. Seen in [facility] ED 3/15, transferred and intubated; extubated 3/17. Found to have acute hypercarbic and hypoxemic respiratory failure secondary COPD exacerbation related to influenza pneumonia and Haemophilus influenzae.   Assessment / Plan / Recommendation Clinical Impression  Pt is not a safe candidate for po's presently given lethargy, decreased awareness and subtle s/s aspiration. Poor attention requiring verbal/tactile cues throughout and intermittent moaning although denies pain. His vocal quality was not significantly hoarse but mildly decreased intensity. Reduced lingual ROM although suspect the result of cognitive challenges at present. He held ice chip in oral cavity manipulating once cued by therapist. Teaspoon sips thin with  suspected pharyngeal delay but  unable to objectify clinically. Recommend continue NPO status with oral care; he may have several ice chips after oral care if desired. ST will follow for appropriateness to initiate po's versus instrumental.     SLP Visit Diagnosis: Dysphagia, unspecified (R13.10)    Aspiration Risk  Moderate aspiration risk    Diet Recommendation Ice chips PRN after oral care;NPO(NPO except ice chips with full supervision)   Medication Administration: Via alternative means    Other  Recommendations Oral Care Recommendations: Oral care QID   Follow up Recommendations Other (comment)(TBD)      Frequency and Duration min 2x/week  2 weeks       Prognosis Prognosis for Safe Diet Advancement: Good      Swallow Study   General HPI: Kyle Lowery is a 68 y.o. male with medical history significant for CAD, COPD, GERD, depression, who presented to the ED with complaints of flulike symptoms of 2 weeks duration. Seen in [facility] ED 3/15, transferred and intubated; extubated 3/17. Found to have acute hypercarbic and hypoxemic respiratory failure secondary COPD exacerbation related to influenza pneumonia and Haemophilus influenzae. Type of Study: Bedside Swallow Evaluation Previous Swallow Assessment: (none) Diet Prior to this Study: NPO Temperature Spikes Noted: No Respiratory Status: Nasal cannula History of Recent Intubation: Yes Length of Intubations (days): 3 days Date extubated: 04/24/18 Behavior/Cognition: Lethargic/Drowsy;Requires cueing;Distractible;Confused Oral Cavity Assessment: Dry Oral Care Completed by SLP: Yes Oral Cavity - Dentition: Poor condition;Missing dentition Vision: Functional for self-feeding Self-Feeding Abilities: Needs set up;Needs assist Patient Positioning: Upright in bed Baseline Vocal Quality: Low vocal intensity Volitional Cough:  Strong Volitional Swallow: Able to elicit    Oral/Motor/Sensory Function Overall Oral Motor/Sensory  Function: Other (comment)(unable to fully participate)   Ice Chips Ice chips: Impaired Presentation: Spoon Oral Phase Impairments: Poor awareness of bolus;Reduced lingual movement/coordination Oral Phase Functional Implications: Oral holding Pharyngeal Phase Impairments: (swallow not initiated)   Thin Liquid Thin Liquid: Impaired Presentation: Spoon Pharyngeal  Phase Impairments: Suspected delayed Swallow    Nectar Thick Nectar Thick Liquid: Not tested   Honey Thick Honey Thick Liquid: Not tested   Puree Puree: Not tested   Solid     Solid: Not tested      [name] 04/25/2018,3:10 PM  Orbie Pyo Colvin Caroli.Ed Risk analyst [phone] Office [phone]

## 2018-04-25 NOTE — Progress Notes (Signed)
OVERNIGHT COVERAGE CRITICAL CARE PROGRESS NOTE  CTSP re: increased work of breathing, respiratory distress and altered mental status.  At the time of clinical interview, the patient is obtunded.  He does move all 4 extremities spontaneously but does not follow commands.  He is demonstrating obviously labored breathing with use of accessory muscles despite BiPAP support.  HR 110s, BP 170s/110s.  RR 21.  SPO2 98%.  On BiPAP 14/6, FiO2 40%.  On auscultation, diminished air entry despite BiPAP support bilaterally.  No wheezes appreciated.  In fact, nearly silent chest.  Intubate. Continue aerosols. Additional dose of Solu-Medrol 60 mg IV single dose. Ventilator settings will be titrated based on ABG results.  Renee Pain, MD Board Certified by the ABIM, [place]

## 2018-04-25 NOTE — Progress Notes (Signed)
NAME:  Kyle Lowery, MRN:  [id number], DOB:  Dec 02, 1950, LOS: 3 ADMISSION DATE:  04/22/2018, CONSULTATION DATE:  04/22/2018 REFERRING MD:  Dr. Roderic Palau, CHIEF COMPLAINT:  Respiratory Distress    History of present illness   68 year old male with reported 2 weeks of cough, congestion, febrile, recently went to PCP, given Z-Pak. Admitted to [facility] 3/10-3/12 with flu-like symptoms, given Tamiflu. Arrives to [facility] ED on 3/15 with A.Fib RVR, hypokalemia, hypoxia, and dyspnea. CXR with bilateral infiltrates requiring intubation. Transferred to [facility] for further evaluation.   Per wife patient went home on 3/12 however was still not feeling normal (she thinks he left against medical advice). For the past few days patient has been weak and short of breath with very poor appetite.   Past Medical History  CAD, COPD, Depression   Significant Hospital Events   3/15 > Presents to [facility] ED > Transferred to [facility]  3/17 > Extubated Consults:  PCCM   Procedures:  ETT 3/15 > 3/17  Significant Diagnostic Tests:  CXR 3/15 > Portion of lateral right base not visualized. Infiltrate noted in visualized right base. Endotracheal tube as described without pneumothorax. Stable cardiac silhouette.  Micro Data:  Tracheal Asp 3/15 >> Moderate Haemophilus influenzae Flu PCR 3/15 > FLU A positive  Blood 3/15 >> Urine 3/15 >>  RVP 3/15>>> neg   Antimicrobials:  Cefepime 3/15 >>> Vancomycin 3/15  >>> Azithromycin 3/16 >>> Tamiflu 3/16>>>  Interim history/subjective:  Extubated. Intermittently on BiPAP but tolerating [place] now. Agitated overnight. Started on Precedex.  Objective   Blood pressure (!) 150/84, pulse (!) 107, temperature (!) 96.1 F (35.6 C), temperature source Axillary, resp. rate (!) 21, height 5\' 8"  (1.727 m), weight 68.1 kg, SpO2 100 %.    Vent Mode: PCV;BIPAP FiO2 (%):  [40 %] 40 % Set Rate:  [15 bmp] 15 bmp PEEP:  [5 cmH20-6 cmH20] 6 cmH20 Pressure Support:  [5 cmH20-6 cmH20] 6  cmH20   Intake/Output Summary (Last 24 hours) at 04/25/2018 1019 Last data filed at 04/25/2018 0900 Gross per 24 hour  Intake 1169.05 ml  Output 725 ml  Net 444.05 ml   Filed Weights   04/22/18 1408 04/24/18 0500 04/25/18 0429  Weight: 59 kg 65.3 kg 68.1 kg   Physical Exam: General: Appears older than stated age, no acute distress HENT: [place], AT, OP clear, MMM Eyes: EOMI, no scleral icterus Respiratory: Scattered bilateral wheezes. No crackles Cardiovascular: RRR, -M/R/G, no JVD GI: BS+, soft, nontender Extremities:-Edema,-tenderness Neuro: Awake, follows commands, moves extremities x 4 Skin: Intact, no rashes or bruising Psych: Normal mood, normal affect GU: Foley in place  Resolved Hospital Problem list   Lactic acidosis Hypotension secondary to sedation  Assessment & Plan:  68 year old male with AHRF secondary to influenza and Haemophilus influenzae in setting of COPD who remains in ICU for agitation requiring sedation. Extubated yesterday  Acute hypercarbic and hypoxemic respiratory failure secondary COPD exacerbation related to influenza pneumonia and Haemophilus influenzae Extubated 3/17. Currently on [place] Plan  Wean supplemental oxygen for goal saturation 88-92% Continue Tamiflu x 5d Continue cefepime and azithromycin x 5 days Decrease steroids to daily.  Total 5 days. Scheduled [place] PT and Speech evaluation  AFRVR H/O CAD, HTN  TTE EF normal.  Currently in normal sinus rhythm Plan Telemetry Restart home metoprolol and amlodipine Continue to hold home lisinopril  Agitation requiring sedation ICU delirium Plan Wean Precedex   AKI Decreased UOP Plan Monitor UOP/Cr  Hypokalemia  Plan Daily BMP, Mg Replete as needed  Best practice:  Diet: Speech eval and diet if able Pain/Anxiety/Delirium protocol (if indicated): Fentanyl VAP protocol (if indicated) DVT prophylaxis: Lovenox GI prophylaxis:  Glucose control: SSI Mobility: PT/OT Code Status: FC  Family Communication: No family at bedside on 3/18  Labs   CBC: Recent Labs  Lab 04/22/18 1430 04/23/18 0053 04/23/18 0107 04/24/18 0624 04/25/18 0049  WBC 6.4 9.4  --  9.9 13.8*  NEUTROABS 5.2  --   --   --   --   HGB 14.1 12.3* 10.5* 10.1* 9.5*  HCT 40.6 36.4* 31.0* 31.4* 29.5*  MCV 96.7 99.7  --  99.7 100.3*  PLT 214 151  --  108* 101*    Basic Metabolic Panel: Recent Labs  Lab 04/22/18 1430 04/23/18 0053 04/23/18 0107 04/23/18 1845 04/24/18 0529 04/25/18 0049  NA 135 136 137  --  137 144  K 2.5* 3.6 3.0*  --  3.6 3.6  CL 95* 102  --   --  111 115*  CO2 25 20*  --   --  18* 22  GLUCOSE 113* 103*  --   --  159* 132*  BUN 21 23  --   --  48* 50*  CREATININE 0.87 1.20  --   --  1.45* 1.38*  CALCIUM 7.9* 7.2*  --   --  6.9* 7.4*  MG 1.6* 2.0  --  1.9 2.0 1.9  PHOS  --  3.8  --  4.2 2.7 2.1*   GFR: Estimated Creatinine Clearance: 50.7 mL/min (A) (by C-G formula based on SCr of 1.38 mg/dL (H)). Recent Labs  Lab 04/22/18 1430 04/22/18 2129 04/23/18 0053 04/23/18 0511 04/24/18 0624 04/25/18 0049  PROCALCITON  --  2.93  --   --   --   --   WBC 6.4  --  9.4  --  9.9 13.8*  LATICACIDVEN  --  5.8* 4.6* 2.5*  --  2.2*    Liver Function Tests: Recent Labs  Lab 04/22/18 1430  AST 21  ALT 15  ALKPHOS 45  BILITOT 1.8*  PROT 6.6  ALBUMIN 2.8*   No results for input(s): LIPASE, AMYLASE in the last 168 hours. No results for input(s): AMMONIA in the last 168 hours.  ABG    Component Value Date/Time   PHART 7.303 (L) 04/23/2018 0107   PCO2ART 50.2 (H) 04/23/2018 0107   PO2ART 165.0 (H) 04/23/2018 0107   HCO3 25.1 04/23/2018 0107   TCO2 27 04/23/2018 0107   ACIDBASEDEF 2.0 04/23/2018 0107   O2SAT 99.0 04/23/2018 0107     Coagulation Profile: No results for input(s): INR, PROTIME in the last 168 hours.  Cardiac Enzymes: Recent Labs  Lab 04/22/18 1430 04/22/18 2129  TROPONINI 0.03* 0.16*    HbA1C: No results found for: HGBA1C  CBG: Recent Labs   Lab 04/24/18 1555 04/24/18 2004 04/25/18 0011 04/25/18 0424 04/25/18 0542  GLUCAP 89 142* 165* 68* 106*     Critical care time: 31 minutes  The patient is critically ill with multiple organ systems failure and requires high complexity decision making for assessment and support, frequent evaluation and titration of therapies, application of advanced monitoring technologies and extensive interpretation of multiple databases.   Rodman Pickle, M.D. [facility] Pulmonary/Critical Care Medicine Pager: [phone] After hours pager: [phone]

## 2018-04-25 NOTE — Progress Notes (Signed)
CRITICAL VALUE ALERT  Critical Value:  Lactic acid 2.2  Date & Time Notied:  04/25/18 @ 0142 Provider Notified: Dr Carson Myrtle  Orders Received/Actions taken: No new orders.

## 2018-04-26 ENCOUNTER — Inpatient Hospital Stay (HOSPITAL_COMMUNITY): Payer: BLUE CROSS/BLUE SHIELD

## 2018-04-26 DIAGNOSIS — R410 Disorientation, unspecified: Secondary | ICD-10-CM

## 2018-04-26 LAB — POCT I-STAT 7, (LYTES, BLD GAS, ICA,H+H)
Acid-Base Excess: 1 mmol/L (ref 0.0–2.0)
Bicarbonate: 26.2 mmol/L (ref 20.0–28.0)
Calcium, Ion: 1.07 mmol/L — ABNORMAL LOW (ref 1.15–1.40)
HCT: 30 % — ABNORMAL LOW (ref 39.0–52.0)
Hemoglobin: 10.2 g/dL — ABNORMAL LOW (ref 13.0–17.0)
O2 Saturation: 99 %
PO2 ART: 151 mmHg — AB (ref 83.0–108.0)
Patient temperature: 98.6
Potassium: 3.7 mmol/L (ref 3.5–5.1)
Sodium: 147 mmol/L — ABNORMAL HIGH (ref 135–145)
TCO2: 27 mmol/L (ref 22–32)
pCO2 arterial: 42.1 mmHg (ref 32.0–48.0)
pH, Arterial: 7.402 (ref 7.350–7.450)

## 2018-04-26 LAB — BASIC METABOLIC PANEL
ANION GAP: 10 (ref 5–15)
BUN: 43 mg/dL — ABNORMAL HIGH (ref 8–23)
CHLORIDE: 114 mmol/L — AB (ref 98–111)
CO2: 22 mmol/L (ref 22–32)
Calcium: 7.2 mg/dL — ABNORMAL LOW (ref 8.9–10.3)
Creatinine, Ser: 1.18 mg/dL (ref 0.61–1.24)
GFR calc Af Amer: >60 mL/min (ref >60)
GFR calc non Af Amer: >60 mL/min (ref >60)
Glucose, Bld: 124 mg/dL — ABNORMAL HIGH (ref 70–99)
POTASSIUM: 3.9 mmol/L (ref 3.5–5.1)
Sodium: 146 mmol/L — ABNORMAL HIGH (ref 135–145)

## 2018-04-26 LAB — CBC
HCT: 32.9 % — ABNORMAL LOW (ref 39.0–52.0)
Hemoglobin: 10.9 g/dL — ABNORMAL LOW (ref 13.0–17.0)
MCH: 33.3 pg (ref 26.0–34.0)
MCHC: 33.1 g/dL (ref 30.0–36.0)
MCV: 100.6 fL — ABNORMAL HIGH (ref 80.0–100.0)
Platelets: 116 10*3/uL — ABNORMAL LOW (ref 150–400)
RBC: 3.27 MIL/uL — ABNORMAL LOW (ref 4.22–5.81)
RDW: 16.9 % — ABNORMAL HIGH (ref 11.5–15.5)
WBC: 13.9 10*3/uL — AB (ref 4.0–10.5)
nRBC: 0.5 % — ABNORMAL HIGH (ref 0.0–0.2)

## 2018-04-26 LAB — GLUCOSE, CAPILLARY
Glucose-Capillary: 96 mg/dL (ref 70–99)
Glucose-Capillary: 61 mg/dL — ABNORMAL LOW (ref 70–99)
Glucose-Capillary: 150 mg/dL — ABNORMAL HIGH (ref 70–99)
Glucose-Capillary: 122 mg/dL — ABNORMAL HIGH (ref 70–99)
Glucose-Capillary: 119 mg/dL — ABNORMAL HIGH (ref 70–99)
Glucose-Capillary: 140 mg/dL — ABNORMAL HIGH (ref 70–99)
Glucose-Capillary: 154 mg/dL — ABNORMAL HIGH (ref 70–99)

## 2018-04-26 LAB — MAGNESIUM: Magnesium: 2.1 mg/dL (ref 1.7–2.4)

## 2018-04-26 LAB — PHOSPHORUS: PHOSPHORUS: 1.8 mg/dL — AB (ref 2.5–4.6)

## 2018-04-26 MED ORDER — ADULT MULTIVITAMIN W/MINERALS CH
1.0000 | ORAL_TABLET | Freq: Every day | ORAL | Status: DC
  Administered 2018-04-26 – 2018-05-05 (×9): 1 via ORAL
  Filled 2018-04-26 (×10): qty 1

## 2018-04-26 MED ORDER — DEXTROSE 50 % IV SOLN
25.0000 mL | Freq: Once | INTRAVENOUS | Status: AC
  Administered 2018-04-26: 25 mL via INTRAVENOUS

## 2018-04-26 MED ORDER — DEXMEDETOMIDINE HCL IN NACL 200 MCG/50ML IV SOLN
0.0000 ug/kg/h | INTRAVENOUS | Status: DC
  Administered 2018-04-26: 1.2 ug/kg/h via INTRAVENOUS
  Administered 2018-04-26: 0.4 ug/kg/h via INTRAVENOUS
  Filled 2018-04-26 (×2): qty 50

## 2018-04-26 MED ORDER — MIDAZOLAM HCL 2 MG/2ML IJ SOLN
1.0000 mg | INTRAMUSCULAR | Status: DC | PRN
  Administered 2018-04-26 – 2018-05-04 (×36): 2 mg via INTRAVENOUS
  Filled 2018-04-26 (×38): qty 2

## 2018-04-26 MED ORDER — VITAL HIGH PROTEIN PO LIQD
1000.0000 mL | ORAL | Status: DC
  Administered 2018-04-26 – 2018-04-29 (×3): 1000 mL

## 2018-04-26 MED ORDER — FOLIC ACID 1 MG PO TABS
1.0000 mg | ORAL_TABLET | Freq: Every day | ORAL | Status: DC
  Administered 2018-04-26 – 2018-05-06 (×10): 1 mg via ORAL
  Filled 2018-04-26 (×11): qty 1

## 2018-04-26 MED ORDER — FENTANYL 2500MCG IN NS 250ML (10MCG/ML) PREMIX INFUSION
0.0000 ug/h | INTRAVENOUS | Status: DC
  Administered 2018-04-26: 25 ug/h via INTRAVENOUS
  Administered 2018-04-27: 325 ug/h via INTRAVENOUS
  Administered 2018-04-27: 350 ug/h via INTRAVENOUS
  Administered 2018-04-28: 225 ug/h via INTRAVENOUS
  Administered 2018-04-28: 350 ug/h via INTRAVENOUS
  Administered 2018-04-29: 375 ug/h via INTRAVENOUS
  Administered 2018-04-29 (×2): 350 ug/h via INTRAVENOUS
  Administered 2018-04-30: 325 ug/h via INTRAVENOUS
  Administered 2018-04-30: 350 ug/h via INTRAVENOUS
  Filled 2018-04-26 (×11): qty 250

## 2018-04-26 MED ORDER — HALOPERIDOL LACTATE 5 MG/ML IJ SOLN
INTRAMUSCULAR | Status: AC
  Administered 2018-04-26: 5 mg via INTRAVENOUS
  Filled 2018-04-26: qty 1

## 2018-04-26 MED ORDER — VITAMIN B-1 100 MG PO TABS
100.0000 mg | ORAL_TABLET | Freq: Every day | ORAL | Status: DC
  Administered 2018-04-26 – 2018-05-06 (×10): 100 mg via ORAL
  Filled 2018-04-26 (×11): qty 1

## 2018-04-26 MED ORDER — HALOPERIDOL LACTATE 5 MG/ML IJ SOLN
INTRAMUSCULAR | Status: AC
  Filled 2018-04-26: qty 1

## 2018-04-26 MED ORDER — METHYLPREDNISOLONE SODIUM SUCC 125 MG IJ SOLR
60.0000 mg | Freq: Three times a day (TID) | INTRAMUSCULAR | Status: DC
  Administered 2018-04-26 – 2018-04-27 (×4): 60 mg via INTRAVENOUS
  Filled 2018-04-26 (×3): qty 2

## 2018-04-26 MED ORDER — QUETIAPINE FUMARATE 25 MG PO TABS
25.0000 mg | ORAL_TABLET | Freq: Every day | ORAL | Status: DC
  Administered 2018-04-26: 25 mg via ORAL
  Filled 2018-04-26: qty 1

## 2018-04-26 MED ORDER — HALOPERIDOL LACTATE 5 MG/ML IJ SOLN
5.0000 mg | Freq: Once | INTRAMUSCULAR | Status: AC
  Administered 2018-04-26: 5 mg via INTRAVENOUS

## 2018-04-26 MED ORDER — DEXTROSE 50 % IV SOLN
INTRAVENOUS | Status: AC
  Filled 2018-04-26: qty 50

## 2018-04-26 MED ORDER — SODIUM CHLORIDE 0.9 % IV SOLN
INTRAVENOUS | Status: DC | PRN
  Administered 2018-04-26: 250 mL via INTRAVENOUS
  Administered 2018-04-27 – 2018-04-29 (×2): 500 mL via INTRAVENOUS
  Administered 2018-05-04: 1000 mL via INTRAVENOUS
  Administered 2018-05-06: 250 mL via INTRAVENOUS
  Administered 2018-05-07: 1000 mL via INTRAVENOUS

## 2018-04-26 MED ORDER — INSULIN ASPART 100 UNIT/ML ~~LOC~~ SOLN
1.0000 [IU] | SUBCUTANEOUS | Status: DC
  Administered 2018-04-26: 2 [IU] via SUBCUTANEOUS
  Administered 2018-04-26 – 2018-04-27 (×2): 1 [IU] via SUBCUTANEOUS
  Administered 2018-04-27: 3 [IU] via SUBCUTANEOUS
  Administered 2018-04-27 (×2): 2 [IU] via SUBCUTANEOUS

## 2018-04-26 NOTE — Progress Notes (Signed)
PT Cancellation Note  Patient Details Name: Kyle Lowery MRN: [id number] DOB: 26-Feb-1950   Cancelled Treatment:    Reason Eval/Treat Not Completed: Medical issues which prohibited therapy. Pt re-intubated 3/18 and requiring sedation. Spoke with RN. No plans to wean off vent today but may be appropriate for extubation tomorrow. PT to follow and proceed with eval when pt medically able.   Lorriane Shire 04/26/2018, 11:06 AM   Lorrin Goodell, PT  Office # [phone] Pager [phone]

## 2018-04-26 NOTE — Progress Notes (Signed)
Speech Language Pathology Discharge Patient Details Name: Kyle Lowery MRN: [id number] DOB: 1950-10-21 Today's Date: 04/26/2018 Time:  -     Patient discharged from SLP services secondary to medical decline - will need to re-order SLP to resume therapy services. Intubated. Please re-consult when/if appropriate  Please see latest therapy progress note for current level of functioning and progress toward goals.    Progress and discharge plan discussed with patient and/or caregiver: Patient unable to participate in discharge planning and no caregivers available  GO     [name] 04/26/2018, 9:04 AM   Orbie Pyo Colvin Caroli.Ed Risk analyst [phone] Office [phone]

## 2018-04-26 NOTE — Progress Notes (Signed)
Nutrition Follow-up  DOCUMENTATION CODES:   Non-severe (moderate) malnutrition in context of chronic illness  INTERVENTION:  Initiate TF via OG tube: - Vital High Protein @ 50 ml/hr (1200 ml/day)  Tube feeding regimen provides 1200 kcal, 105 grams of protein, and 1003 ml of H2O.   Tube feeding regimen and current propofol provides 1525 total kcal (97% of needs).  NUTRITION DIAGNOSIS:   Moderate Malnutrition related to chronic illness (COPD) as evidenced by mild fat depletion, moderate fat depletion, mild muscle depletion, moderate muscle depletion.  Ongoing, being addressed via TF  GOAL:   Patient will meet greater than or equal to 90% of their needs  Met via TF  MONITOR:   Vent status, Weight trends, TF tolerance, Labs, Propofol  REASON FOR ASSESSMENT:   Ventilator, Consult Enteral/tube feeding initiation and management  ASSESSMENT:   68 year old male who presented to the ED on 3/15 with flu-like symptoms. PMH of CAD, COPD, depression, GERD, HTN, HLD. Pt intubated in the ED. Admitted with PNA, flu A positive.  3/17 - extubated 3/18 - reintubated  Discussed pt with RN and during ICU rounds. Pt reintubated overnight due to increased WOB. RD consulted to restart tube feeds.  Pt now on propofol for sedation. Per critical care MD note, SBT when mental status improves.  Patient is currently intubated on ventilator support. Per x-ray, OG tube in stable position. MV: 11.3 L/min Temp (24hrs), Avg:97.6 F (36.4 C), Min:97.5 F (36.4 C), Max:97.7 F (36.5 C) BP: 113/62 MAP: 78  Propofol: 12.3 ml/hr (provides 325 kcal daily)  Medications reviewed and include: SSI q 4 hours, Solu-medrol, Tamiflu, Protonix, IV antibiotics  Labs reviewed: sodium 146 (H), BUN 43 (H), phosphorus 1.8 (L) CBG's: 61, 133, 169, 116 x 24 hours  UOP: 2925 ml x 24 hours I/O's: +2 L since admit  Diet Order:   Diet Order            Diet NPO time specified  Diet effective now              EDUCATION NEEDS:   Not appropriate for education at this time  Skin:  Skin Assessment: Reviewed RN Assessment  Last BM:  04/25/18 medium type 7  Height:   Ht Readings from Last 1 Encounters:  04/22/18 _0  (1.727 m)    Weight:   Wt Readings from Last 1 Encounters:  04/26/18 63.5 kg    Ideal Body Weight:  70 kg  BMI:  Body mass index is 21.29 kg/m.  Estimated Nutritional Needs:   Kcal:  9622  Protein:  85-100 grams  Fluid:  1.6-1.8 L    Gaynell Face, MS, RD, LDN Inpatient Clinical Dietitian Pager: [phone] Weekend/After Hours: [phone]

## 2018-04-26 NOTE — Progress Notes (Signed)
Inpatient Diabetes Program Recommendations  AACE/ADA: New Consensus Statement on Inpatient Glycemic Control (2015)  Target Ranges:  Prepandial:   less than 140 mg/dL      Peak postprandial:   less than 180 mg/dL (1-2 hours)      Critically ill patients:  140 - 180 mg/dL   Lab Results  Component Value Date   GLUCAP 119 (H) 04/26/2018    Review of Glycemic Control Results for Kyle Lowery, Kyle Lowery (MRN [id number]) as of 04/26/2018 11:30  Ref. Range 04/25/2018 20:23 04/25/2018 23:47 04/26/2018 03:43 04/26/2018 11:11  Glucose-Capillary Latest Ref Range: 70 - 99 mg/dL 169 (H) 133 (H) 61 (L) 119 (H)   Diabetes history: no history noted Outpatient Diabetes medications: none Current orders for Inpatient glycemic control: Novolog 2- 6 units Q4H  Inpatient Diabetes Program Recommendations:    Noted hypoglycemic event of 61 mg/dL, may want to consider decreasing correction to Novolog 1-3 units Q4H.   Also of note, no recorded A1C. May want to add on A1C?  Thanks, Bronson Curb, MSN, RNC-OB Diabetes Coordinator [phone] (8a-5p)

## 2018-04-26 NOTE — Progress Notes (Signed)
NAME:  Kyle Lowery, MRN:  [id number], DOB:  Mar 07, 1950, LOS: 4 ADMISSION DATE:  04/22/2018, CONSULTATION DATE:  04/22/2018 REFERRING MD:  Dr. Roderic Palau, CHIEF COMPLAINT:  Respiratory Distress    History of present illness   68 year old male with reported 2 weeks of cough, congestion, febrile, recently went to PCP, given Z-Pak. Admitted to [facility] 3/10-3/12 with flu-like symptoms, given Tamiflu. Arrives to [facility] ED on 3/15 with A.Fib RVR, hypokalemia, hypoxia, and dyspnea. CXR with bilateral infiltrates requiring intubation. Transferred to [facility] for further evaluation.   Per wife patient went home on 3/12 however was still not feeling normal (she thinks he left against medical advice). For the past few days patient has been weak and short of breath with very poor appetite.   Past Medical History  CAD, COPD, Depression   Significant Hospital Events   3/15 > Presents to [facility] ED > Transferred to [facility]  3/17 > Extubated 3/18 > Re-intubated for increased WOB Consults:  PCCM   Procedures:  ETT 3/15 > 3/17  Significant Diagnostic Tests:  CXR 3/15 > Portion of lateral right base not visualized. Infiltrate noted in visualized right base. Endotracheal tube as described without pneumothorax. Stable cardiac silhouette.  Micro Data:  Tracheal Asp 3/15 >> Moderate Haemophilus influenzae Flu PCR 3/15 > FLU A positive  Blood 3/15 >> Urine 3/15 >>  RVP 3/15>>> neg   Antimicrobials:  Cefepime 3/15 >>> Vancomycin 3/15  >>> Azithromycin 3/16 >>> Tamiflu 3/16>>>  Interim history/subjective:  Extubated on 3/17. Overnight increased WOB, failed BiPAP and was intubated. On minimal vent requirements. Currently sedated but followed commands with nursing.  Objective   Blood pressure (!) 151/85, pulse 100, temperature 97.7 F (36.5 C), temperature source Axillary, resp. rate (!) 30, height 5\' 8"  (1.727 m), weight 63.5 kg, SpO2 96 %.    Vent Mode: PRVC FiO2 (%):  [40 %-60 %] 40 % Set Rate:   [15 bmp-20 bmp] 20 bmp Vt Set:  [540 mL-580 mL] 580 mL PEEP:  [5 cmH20-6 cmH20] 5 cmH20 Plateau Pressure:  [16 cmH20-23 cmH20] 16 cmH20   Intake/Output Summary (Last 24 hours) at 04/26/2018 0928 Last data filed at 04/26/2018 0730 Gross per 24 hour  Intake 522.39 ml  Output 3025 ml  Net -2502.61 ml   Filed Weights   04/24/18 0500 04/25/18 0429 04/26/18 0414  Weight: 65.3 kg 68.1 kg 63.5 kg    Physical Exam: General: Well-appearing, no acute distress HENT: [place], AT, ETT in place Eyes: EOMI, no scleral icterus Respiratory: Clear to auscultation bilaterally.  No crackles, wheezing or rales Cardiovascular: RRR, -M/R/G, no JVD GI: BS+, soft, nontender Extremities:-Edema,-tenderness Neuro: Sedated Skin: Intact, no rashes or bruising Psych: Normal mood, normal affect GU: Foley in place   Resolved Hospital Problem list   Lactic acidosis Hypotension secondary to sedation  Assessment & Plan:  68 year old male with AHRF secondary to influenza and Haemophilus influenzae in setting of COPD who remains in ICU for agitation requiring sedation. Extubated yesterday  Acute hypercarbic and hypoxemic respiratory failure secondary COPD exacerbation related to influenza pneumonia and Haemophilus influenzae Extubated 3/17. Reintubated on 3/18.  Plan  Full vent support SBT when mental status improves Wean supplemental oxygen for goal saturation 88-92% Continue Tamiflu x 5d Continue cefepime and azithromycin x 5 days Increased Solumedrol to q8h Scheduled Duonebs  AFRVR H/O CAD, HTN  TTE EF normal.  Currently in normal sinus rhythm Plan Telemetry Continue home metoprolol and amlodipine Hold home lisinopril  Agitation requiring sedation ICU delirium Plan Wean propofol Seroquel nightly  AKI - resolved Improved UOP Plan Monitor UOP/Cr   Best practice:  Diet: TF Pain/Anxiety/Delirium protocol (if indicated): Fentanyl VAP protocol (if indicated) DVT prophylaxis: Lovenox GI  prophylaxis:  Glucose control: SSI Mobility: PT/OT Code Status: FC Family Communication: No family at bedside. I discussed plan with RN who will contact patient's status including recent intubation  Labs   CBC: Recent Labs  Lab 04/22/18 1430 04/23/18 0053 04/23/18 0107 04/24/18 0624 04/25/18 0049 04/26/18 0248 04/26/18 0336  WBC 6.4 9.4  --  9.9 13.8*  --  13.9*  NEUTROABS 5.2  --   --   --   --   --   --   HGB 14.1 12.3* 10.5* 10.1* 9.5* 10.2* 10.9*  HCT 40.6 36.4* 31.0* 31.4* 29.5* 30.0* 32.9*  MCV 96.7 99.7  --  99.7 100.3*  --  100.6*  PLT 214 151  --  108* 101*  --  116*    Basic Metabolic Panel: Recent Labs  Lab 04/23/18 0053  04/23/18 1845 04/24/18 0529 04/25/18 0049 04/25/18 2146 04/26/18 0248 04/26/18 0336  NA 136   < >  --  137 144 144 147* 146*  K 3.6   < >  --  3.6 3.6 3.7 3.7 3.9  CL 102  --   --  111 115* 115*  --  114*  CO2 20*  --   --  18* 22 22  --  22  GLUCOSE 103*  --   --  159* 132* 152*  --  124*  BUN 23  --   --  48* 50* 43*  --  43*  CREATININE 1.20  --   --  1.45* 1.38* 1.13  --  1.18  CALCIUM 7.2*  --   --  6.9* 7.4* 7.4*  --  7.2*  MG 2.0  --  1.9 2.0 1.9  --   --  2.1  PHOS 3.8  --  4.2 2.7 2.1* 2.6  --  1.8*   < > = values in this interval not displayed.   GFR: Estimated Creatinine Clearance: 55.3 mL/min (by C-G formula based on SCr of 1.18 mg/dL). Recent Labs  Lab 04/22/18 2129 04/23/18 0053 04/23/18 0511 04/24/18 0624 04/25/18 0049 04/26/18 0336  PROCALCITON 2.93  --   --   --   --   --   WBC  --  9.4  --  9.9 13.8* 13.9*  LATICACIDVEN 5.8* 4.6* 2.5*  --  2.2*  --     Liver Function Tests: Recent Labs  Lab 04/22/18 1430 04/25/18 2146  AST 21  --   ALT 15  --   ALKPHOS 45  --   BILITOT 1.8*  --   PROT 6.6  --   ALBUMIN 2.8* 2.1*   No results for input(s): LIPASE, AMYLASE in the last 168 hours. No results for input(s): AMMONIA in the last 168 hours.  ABG    Component Value Date/Time   PHART 7.402 04/26/2018  0248   PCO2ART 42.1 04/26/2018 0248   PO2ART 151.0 (H) 04/26/2018 0248   HCO3 26.2 04/26/2018 0248   TCO2 27 04/26/2018 0248   ACIDBASEDEF 1.4 04/25/2018 2320   O2SAT 99.0 04/26/2018 0248     Coagulation Profile: No results for input(s): INR, PROTIME in the last 168 hours.  Cardiac Enzymes: Recent Labs  Lab 04/22/18 1430 04/22/18 2129  TROPONINI 0.03* 0.16*    HbA1C: No results found for:  HGBA1C  CBG: Recent Labs  Lab 04/25/18 1143 04/25/18 1638 04/25/18 2023 04/25/18 2347 04/26/18 0343  GLUCAP 101* 116* 169* 133* 61*     Critical care time: 32 minutes  The patient is critically ill with multiple organ systems failure and requires high complexity decision making for assessment and support, frequent evaluation and titration of therapies, application of advanced monitoring technologies and extensive interpretation of multiple databases.   Rodman Pickle, M.D. [facility] Pulmonary/Critical Care Medicine Pager: [phone] After hours pager: [phone]

## 2018-04-26 NOTE — Care Management (Signed)
Patient remains acutely ill with vent support, unable to complete readmission risk screening at this time. CM team will continue to follow for dispositional needs.  Johntavious Francom RN, BSN, NCM-BC, ACM-RN [phone]  

## 2018-04-27 LAB — CBC
HCT: 33.6 % — ABNORMAL LOW (ref 39.0–52.0)
Hemoglobin: 10.7 g/dL — ABNORMAL LOW (ref 13.0–17.0)
MCH: 32.5 pg (ref 26.0–34.0)
MCHC: 31.8 g/dL (ref 30.0–36.0)
MCV: 102.1 fL — AB (ref 80.0–100.0)
Platelets: 118 10*3/uL — ABNORMAL LOW (ref 150–400)
RBC: 3.29 MIL/uL — ABNORMAL LOW (ref 4.22–5.81)
RDW: 17.1 % — AB (ref 11.5–15.5)
WBC: 10.1 10*3/uL (ref 4.0–10.5)
nRBC: 0.3 % — ABNORMAL HIGH (ref 0.0–0.2)

## 2018-04-27 LAB — GLUCOSE, CAPILLARY
Glucose-Capillary: 108 mg/dL — ABNORMAL HIGH (ref 70–99)
Glucose-Capillary: 126 mg/dL — ABNORMAL HIGH (ref 70–99)
Glucose-Capillary: 155 mg/dL — ABNORMAL HIGH (ref 70–99)
Glucose-Capillary: 170 mg/dL — ABNORMAL HIGH (ref 70–99)
Glucose-Capillary: 201 mg/dL — ABNORMAL HIGH (ref 70–99)
Glucose-Capillary: 93 mg/dL (ref 70–99)

## 2018-04-27 LAB — CULTURE, BLOOD (ROUTINE X 2)
Culture: NO GROWTH
Culture: NO GROWTH

## 2018-04-27 LAB — BASIC METABOLIC PANEL
ANION GAP: 12 (ref 5–15)
BUN: 46 mg/dL — ABNORMAL HIGH (ref 8–23)
CO2: 22 mmol/L (ref 22–32)
Calcium: 7.5 mg/dL — ABNORMAL LOW (ref 8.9–10.3)
Chloride: 116 mmol/L — ABNORMAL HIGH (ref 98–111)
Creatinine, Ser: 0.96 mg/dL (ref 0.61–1.24)
GFR calc Af Amer: >60 mL/min (ref >60)
GFR calc non Af Amer: >60 mL/min (ref >60)
GLUCOSE: 118 mg/dL — AB (ref 70–99)
Potassium: 3.6 mmol/L (ref 3.5–5.1)
Sodium: 150 mmol/L — ABNORMAL HIGH (ref 135–145)

## 2018-04-27 LAB — MAGNESIUM: Magnesium: 2.1 mg/dL (ref 1.7–2.4)

## 2018-04-27 LAB — PHOSPHORUS: Phosphorus: 1.7 mg/dL — ABNORMAL LOW (ref 2.5–4.6)

## 2018-04-27 MED ORDER — QUETIAPINE FUMARATE 25 MG PO TABS
25.0000 mg | ORAL_TABLET | Freq: Two times a day (BID) | ORAL | Status: DC
  Administered 2018-04-28: 25 mg via ORAL
  Filled 2018-04-27 (×3): qty 1

## 2018-04-27 MED ORDER — DEXMEDETOMIDINE HCL IN NACL 200 MCG/50ML IV SOLN
0.0000 ug/kg/h | INTRAVENOUS | Status: DC
  Administered 2018-04-27 (×2): 1.2 ug/kg/h via INTRAVENOUS
  Administered 2018-04-27: 0.4 ug/kg/h via INTRAVENOUS
  Administered 2018-04-27 – 2018-04-28 (×5): 1.2 ug/kg/h via INTRAVENOUS
  Filled 2018-04-27 (×9): qty 50

## 2018-04-27 MED ORDER — FREE WATER
200.0000 mL | Freq: Four times a day (QID) | Status: DC
  Administered 2018-04-27 – 2018-04-28 (×5): 200 mL

## 2018-04-27 MED ORDER — POTASSIUM PHOSPHATES 15 MMOLE/5ML IV SOLN
15.0000 mmol | Freq: Once | INTRAVENOUS | Status: AC
  Administered 2018-04-27: 15 mmol via INTRAVENOUS
  Filled 2018-04-27: qty 5

## 2018-04-27 MED ORDER — CLONAZEPAM 0.5 MG PO TABS
0.5000 mg | ORAL_TABLET | Freq: Two times a day (BID) | ORAL | Status: DC
  Administered 2018-04-27 – 2018-04-29 (×5): 0.5 mg
  Filled 2018-04-27 (×5): qty 1

## 2018-04-27 MED ORDER — OSELTAMIVIR PHOSPHATE 75 MG PO CAPS
75.0000 mg | ORAL_CAPSULE | Freq: Two times a day (BID) | ORAL | Status: AC
  Administered 2018-04-27: 75 mg via ORAL
  Filled 2018-04-27: qty 1

## 2018-04-27 MED ORDER — BUDESONIDE 0.25 MG/2ML IN SUSP
0.2500 mg | Freq: Two times a day (BID) | RESPIRATORY_TRACT | Status: DC
  Administered 2018-04-27 – 2018-04-29 (×5): 0.25 mg via RESPIRATORY_TRACT
  Filled 2018-04-27 (×5): qty 2

## 2018-04-27 NOTE — Progress Notes (Signed)
PT Cancellation Note  Patient Details Name: Kyle Lowery MRN: [id number] DOB: [birth year]   Cancelled Treatment:    Reason Eval/Treat Not Completed: Medical issues which prohibited therapy. Pt remains intubated/sedated. PT to proceed with eval when pt medically ready/able to participate.    Lorriane Shire 04/27/2018, 11:29 AM  Lorrin Goodell, PT  Office # [phone] Pager [phone]

## 2018-04-27 NOTE — Plan of Care (Signed)
  Problem: Clinical Measurements: Goal: Ability to maintain clinical measurements within normal limits will improve Outcome: Progressing Goal: Will remain free from infection Outcome: Progressing Goal: Respiratory complications will improve Outcome: Progressing Goal: Cardiovascular complication will be avoided Outcome: Progressing   Problem: Safety: Goal: Ability to remain free from injury will improve Outcome: Progressing   Problem: Respiratory: Goal: Ability to maintain a clear airway will improve Outcome: Progressing Goal: Ability to maintain adequate ventilation will improve Outcome: Progressing   Problem: Respiratory: Goal: Ability to maintain a clear airway and adequate ventilation will improve Outcome: Progressing

## 2018-04-27 NOTE — Progress Notes (Signed)
NAME:  Kyle Lowery, MRN:  [id number], DOB:  10/06/1950, LOS: 5 ADMISSION DATE:  04/22/2018, CONSULTATION DATE:  04/22/2018 REFERRING MD:  Dr. Roderic Palau, CHIEF COMPLAINT:  Respiratory Distress    History of present illness   68 year old male with reported 2 weeks of cough, congestion, febrile, recently went to PCP, given Z-Pak. Admitted to [facility] 3/10-3/12 with flu-like symptoms, given Tamiflu. Arrives to [facility] ED on 3/15 with A.Fib RVR, hypokalemia, hypoxia, and dyspnea. CXR with bilateral infiltrates requiring intubation. Transferred to [facility] for further evaluation.   Per wife patient went home on 3/12 however was still not feeling normal (she thinks he left against medical advice). For the past few days patient has been weak and short of breath with very poor appetite.   Past Medical History  CAD, COPD, Depression   Significant Hospital Events   3/15 > Presents to [facility] ED > Transferred to [facility]  3/17 > Extubated 3/18 > Re-intubated for increased WOB Consults:  PCCM   Procedures:  ETT 3/15 > 3/17 Reintubated 04/25/2018>>  Significant Diagnostic Tests:  CXR 3/15 > Portion of lateral right base not visualized. Infiltrate noted in visualized right base. Endotracheal tube as described without pneumothorax. Stable cardiac silhouette.  Micro Data:  Tracheal Asp 3/15 >> Moderate Haemophilus influenzae Flu PCR 3/15 > FLU A positive  Blood 3/15 >> negative Urine 3/15 >> not done RVP 3/15>>> neg   Antimicrobials:  Cefepime 3/15 >>> Vancomycin 3/15  >>> 04/26/2018 Azithromycin 3/16 >>> 04/26/2018 Tamiflu 3/16>>> stop date 04/29/2018  Interim history/subjective:  Extubated on 3/17. Overnight increased WOB, failed BiPAP and was intubated. On minimal vent requirements. Currently sedated but followed commands with nursing.  Objective   Blood pressure (!) 98/52, pulse (!) 127, temperature 97.8 F (36.6 C), temperature source Oral, resp. rate 20, height 5\' 8"  (1.727 m), weight 62.3  kg, SpO2 100 %.    Vent Mode: PSV;CPAP FiO2 (%):  [40 %] 40 % Set Rate:  [20 bmp] 20 bmp Vt Set:  [580 mL] 580 mL PEEP:  [5 cmH20] 5 cmH20 Pressure Support:  [10 cmH20] 10 cmH20 Plateau Pressure:  [12 cmH20-18 cmH20] 16 cmH20   Intake/Output Summary (Last 24 hours) at 04/27/2018 1008 Last data filed at 04/27/2018 0600 Gross per 24 hour  Intake 1263.6 ml  Output 795 ml  Net 468.6 ml   Filed Weights   04/25/18 0429 04/26/18 0414 04/27/18 0238  Weight: 68.1 kg 63.5 kg 62.3 kg    Physical Exam: General: Thin male who is heavily sedated reported to be agitated combative when not sedated HEENT: Endotracheal tube gastric tube in place no JVD or lymphadenopathy is appreciated Neuro: Currently heavily sedated CV: Heart sounds are regular regular rate and rhythm currently normal sinus rhythm PULM: Diminished breath sounds in the bases SW:NIOE, non-tender, bsx4 active  Extremities: warm/dry, negative edema  Skin: no rashes or lesions    Resolved Hospital Problem list   Lactic acidosis Hypotension secondary to sedation  Assessment & Plan:  68 year old male with AHRF secondary to influenza and Haemophilus influenzae in setting of COPD who remains in ICU for agitation requiring sedation. Extubated and reintubated 04/25/2018  Acute hypercarbic and hypoxemic respiratory failure secondary COPD exacerbation related to influenza pneumonia and Haemophilus influenzae Extubated 3/17. Reintubated on 3/18.  Plan  Continue full ventilatory support Increased sedation will try Precedex to mediate his combativeness Add Klonopin twice daily Increase Seroquel to twice daily Wean per protocol   AFRVR H/O  CAD, HTN  TTE EF normal.  Currently in normal sinus rhythm Plan Cardiac monitoring Continue home medications of metoprolol and Norvasc Holding lisinopril  Agitation requiring sedation ICU delirium Plan Add Precedex to hopefully be able to wean propofol Continue Seroquel at twice daily      AKI -  Lab Results  Component Value Date   CREATININE 0.96 04/27/2018   CREATININE 1.18 04/26/2018   CREATININE 1.13 04/25/2018  Hyponatremia hypo phosphatemia Monitor creatinine and urine output  Plan Monitor UOP/Cr Replete electrolytes Add free water for elevated sodium   Best practice:  Diet: TF Pain/Anxiety/Delirium protocol (if indicated): Fentanyl VAP protocol (if indicated) DVT prophylaxis: Lovenox GI prophylaxis:  Glucose control: SSI Mobility: PT/OT Code Status: FC Family Communication: 04/27/2018 no family at bedside  Labs   CBC: Recent Labs  Lab 04/22/18 1430 04/23/18 0053  04/24/18 0624 04/25/18 0049 04/26/18 0248 04/26/18 0336 04/27/18 0724  WBC 6.4 9.4  --  9.9 13.8*  --  13.9* 10.1  NEUTROABS 5.2  --   --   --   --   --   --   --   HGB 14.1 12.3*   < > 10.1* 9.5* 10.2* 10.9* 10.7*  HCT 40.6 36.4*   < > 31.4* 29.5* 30.0* 32.9* 33.6*  MCV 96.7 99.7  --  99.7 100.3*  --  100.6* 102.1*  PLT 214 151  --  108* 101*  --  116* 118*   < > = values in this interval not displayed.    Basic Metabolic Panel: Recent Labs  Lab 04/23/18 1845 04/24/18 0529 04/25/18 0049 04/25/18 2146 04/26/18 0248 04/26/18 0336 04/27/18 0724  NA  --  137 144 144 147* 146* 150*  K  --  3.6 3.6 3.7 3.7 3.9 3.6  CL  --  111 115* 115*  --  114* 116*  CO2  --  18* 22 22  --  22 22  GLUCOSE  --  159* 132* 152*  --  124* 118*  BUN  --  48* 50* 43*  --  43* 46*  CREATININE  --  1.45* 1.38* 1.13  --  1.18 0.96  CALCIUM  --  6.9* 7.4* 7.4*  --  7.2* 7.5*  MG 1.9 2.0 1.9  --   --  2.1 2.1  PHOS 4.2 2.7 2.1* 2.6  --  1.8* 1.7*   GFR: Estimated Creatinine Clearance: 66.7 mL/min (by C-G formula based on SCr of 0.96 mg/dL). Recent Labs  Lab 04/22/18 2129 04/23/18 0053 04/23/18 0511 04/24/18 0624 04/25/18 0049 04/26/18 0336 04/27/18 0724  PROCALCITON 2.93  --   --   --   --   --   --   WBC  --  9.4  --  9.9 13.8* 13.9* 10.1  LATICACIDVEN 5.8* 4.6* 2.5*  --  2.2*   --   --     Liver Function Tests: Recent Labs  Lab 04/22/18 1430 04/25/18 2146  AST 21  --   ALT 15  --   ALKPHOS 45  --   BILITOT 1.8*  --   PROT 6.6  --   ALBUMIN 2.8* 2.1*   No results for input(s): LIPASE, AMYLASE in the last 168 hours. No results for input(s): AMMONIA in the last 168 hours.  ABG    Component Value Date/Time   PHART 7.402 04/26/2018 0248   PCO2ART 42.1 04/26/2018 0248   PO2ART 151.0 (H) 04/26/2018 0248   HCO3 26.2 04/26/2018 0248   TCO2 27  04/26/2018 0248   ACIDBASEDEF 1.4 04/25/2018 2320   O2SAT 99.0 04/26/2018 0248     Coagulation Profile: No results for input(s): INR, PROTIME in the last 168 hours.  Cardiac Enzymes: Recent Labs  Lab 04/22/18 1430 04/22/18 2129  TROPONINI 0.03* 0.16*    HbA1C: No results found for: HGBA1C  CBG: Recent Labs  Lab 04/26/18 1546 04/26/18 2005 04/27/18 0058 04/27/18 0414 04/27/18 0755  GLUCAP 140* 154* 201* 126* 108*     App cct 30 min   Kyle Lowery ACNP [facility] PCCM Pager [phone] till 1 pm If no answer page [phone] 04/27/2018, 10:08 AM

## 2018-04-27 NOTE — Progress Notes (Signed)
CRITICAL VALUE ALERT  Critical Value:  EKG  Critical Test Result : Long QTC 550  Date & Time Notied: 04/27/2018  Provider Notified: Gaylyn Lambert, NP   Orders Received/Actions taken: "Stop Seroquel". Will Stop Seroquel and Continue to monitor.   Lucius Conn, RN

## 2018-04-27 NOTE — Progress Notes (Signed)
Of note... Pt calm and HR finally in the 80's when pt listens to [facility] on computer. Will try to titrate sedation down and maintain RASS goal. Will continue to monitor.  Lucius Conn, RN

## 2018-04-28 ENCOUNTER — Inpatient Hospital Stay (HOSPITAL_COMMUNITY): Payer: BLUE CROSS/BLUE SHIELD

## 2018-04-28 ENCOUNTER — Inpatient Hospital Stay: Payer: Self-pay

## 2018-04-28 LAB — BASIC METABOLIC PANEL
Anion gap: 6 (ref 5–15)
BUN: 45 mg/dL — AB (ref 8–23)
CO2: 25 mmol/L (ref 22–32)
CREATININE: 0.89 mg/dL (ref 0.61–1.24)
Calcium: 7.3 mg/dL — ABNORMAL LOW (ref 8.9–10.3)
Chloride: 116 mmol/L — ABNORMAL HIGH (ref 98–111)
GFR calc Af Amer: >60 mL/min (ref >60)
GFR calc non Af Amer: >60 mL/min (ref >60)
Glucose, Bld: 92 mg/dL (ref 70–99)
POTASSIUM: 3.5 mmol/L (ref 3.5–5.1)
Sodium: 147 mmol/L — ABNORMAL HIGH (ref 135–145)

## 2018-04-28 LAB — GLUCOSE, CAPILLARY
Glucose-Capillary: 86 mg/dL (ref 70–99)
Glucose-Capillary: 115 mg/dL — ABNORMAL HIGH (ref 70–99)
Glucose-Capillary: 103 mg/dL — ABNORMAL HIGH (ref 70–99)
Glucose-Capillary: 79 mg/dL (ref 70–99)
Glucose-Capillary: 90 mg/dL (ref 70–99)

## 2018-04-28 LAB — CBC WITH DIFFERENTIAL/PLATELET
Abs Immature Granulocytes: 0.23 10*3/uL — ABNORMAL HIGH (ref 0.00–0.07)
Basophils Absolute: 0 10*3/uL (ref 0.0–0.1)
Basophils Relative: 0 %
Eosinophils Absolute: 0 10*3/uL (ref 0.0–0.5)
Eosinophils Relative: 0 %
HEMATOCRIT: 28.1 % — AB (ref 39.0–52.0)
Hemoglobin: 9.3 g/dL — ABNORMAL LOW (ref 13.0–17.0)
Immature Granulocytes: 2 %
Lymphocytes Relative: 10 %
Lymphs Abs: 1.1 10*3/uL (ref 0.7–4.0)
MCH: 33.5 pg (ref 26.0–34.0)
MCHC: 33.1 g/dL (ref 30.0–36.0)
MCV: 101.1 fL — ABNORMAL HIGH (ref 80.0–100.0)
MONOS PCT: 5 %
Monocytes Absolute: 0.6 10*3/uL (ref 0.1–1.0)
Neutro Abs: 8.6 10*3/uL — ABNORMAL HIGH (ref 1.7–7.7)
Neutrophils Relative %: 83 %
Platelets: 114 10*3/uL — ABNORMAL LOW (ref 150–400)
RBC: 2.78 MIL/uL — ABNORMAL LOW (ref 4.22–5.81)
RDW: 16.8 % — AB (ref 11.5–15.5)
WBC: 10.5 10*3/uL (ref 4.0–10.5)
nRBC: 0.2 % (ref 0.0–0.2)

## 2018-04-28 LAB — PHOSPHORUS: Phosphorus: 2.4 mg/dL — ABNORMAL LOW (ref 2.5–4.6)

## 2018-04-28 LAB — MAGNESIUM: Magnesium: 1.8 mg/dL (ref 1.7–2.4)

## 2018-04-28 MED ORDER — SODIUM CHLORIDE 0.9 % IV BOLUS
500.0000 mL | Freq: Once | INTRAVENOUS | Status: AC
  Administered 2018-04-28: 500 mL via INTRAVENOUS

## 2018-04-28 MED ORDER — ALBUMIN HUMAN 25 % IV SOLN
25.0000 g | Freq: Once | INTRAVENOUS | Status: DC
  Filled 2018-04-28: qty 50

## 2018-04-28 MED ORDER — WHITE PETROLATUM EX OINT
TOPICAL_OINTMENT | CUTANEOUS | Status: AC
  Administered 2018-04-28: 0.2
  Filled 2018-04-28: qty 28.35

## 2018-04-28 MED ORDER — SODIUM CHLORIDE 0.9% FLUSH: 10.0000 mL | INTRAVENOUS | Status: DC | PRN

## 2018-04-28 MED ORDER — ASPIRIN 81 MG PO CHEW
81.0000 mg | CHEWABLE_TABLET | Freq: Every day | ORAL | Status: DC
  Administered 2018-04-28 – 2018-04-30 (×3): 81 mg
  Filled 2018-04-28 (×4): qty 1

## 2018-04-28 MED ORDER — SODIUM CHLORIDE 0.9% FLUSH
10.0000 mL | Freq: Two times a day (BID) | INTRAVENOUS | Status: DC
  Administered 2018-04-29 – 2018-05-06 (×9): 10 mL
  Administered 2018-05-07: 30 mL
  Administered 2018-05-07 – 2018-05-08 (×3): 10 mL
  Administered 2018-05-09: 20 mL

## 2018-04-28 MED ORDER — DEXMEDETOMIDINE HCL IN NACL 400 MCG/100ML IV SOLN
0.4000 ug/kg/h | INTRAVENOUS | Status: DC
  Administered 2018-04-28: 0.8 ug/kg/h via INTRAVENOUS
  Administered 2018-04-28 (×2): 1.2 ug/kg/h via INTRAVENOUS
  Administered 2018-04-29 (×2): 1 ug/kg/h via INTRAVENOUS
  Administered 2018-04-29: 1.2 ug/kg/h via INTRAVENOUS
  Administered 2018-04-30: 1 ug/kg/h via INTRAVENOUS
  Administered 2018-04-30: 0.8 ug/kg/h via INTRAVENOUS
  Administered 2018-04-30 (×2): 1 ug/kg/h via INTRAVENOUS
  Administered 2018-05-01 (×2): 1.669 ug/kg/h via INTRAVENOUS
  Administered 2018-05-01 (×2): 1 ug/kg/h via INTRAVENOUS
  Administered 2018-05-01 – 2018-05-02 (×2): 1.7 ug/kg/h via INTRAVENOUS
  Administered 2018-05-02: 1.3 ug/kg/h via INTRAVENOUS
  Administered 2018-05-02: 0.6 ug/kg/h via INTRAVENOUS
  Administered 2018-05-03 (×2): 0.5 ug/kg/h via INTRAVENOUS
  Filled 2018-04-28 (×3): qty 100
  Filled 2018-04-28: qty 200
  Filled 2018-04-28 (×15): qty 100

## 2018-04-28 MED ORDER — POTASSIUM CHLORIDE 20 MEQ/15ML (10%) PO SOLN
20.0000 meq | ORAL | Status: AC
  Administered 2018-04-28 (×2): 20 meq
  Filled 2018-04-28 (×2): qty 15

## 2018-04-28 MED ORDER — CHLORHEXIDINE GLUCONATE CLOTH 2 % EX PADS
6.0000 | MEDICATED_PAD | Freq: Every day | CUTANEOUS | Status: DC
  Administered 2018-04-29 – 2018-04-30 (×3): 6 via TOPICAL

## 2018-04-28 MED ORDER — FREE WATER
300.0000 mL | Freq: Four times a day (QID) | Status: DC
  Administered 2018-04-28 – 2018-04-30 (×10): 300 mL

## 2018-04-28 NOTE — Progress Notes (Signed)
Peripherally Inserted Central Catheter/Midline Placement  The IV Nurse has discussed with the patient and/or persons authorized to consent for the patient, the purpose of this procedure and the potential benefits and risks involved with this procedure.  The benefits include less needle sticks, lab draws from the catheter, and the patient may be discharged home with the catheter. Risks include, but not limited to, infection, bleeding, blood clot (thrombus formation), and puncture of an artery; nerve damage and irregular heartbeat and possibility to perform a PICC exchange if needed/ordered by physician.  Alternatives to this procedure were also discussed.  Bard Power PICC patient education guide, fact sheet on infection prevention and patient information card has been provided to patient /or left at bedside.    PICC/Midline Placement Documentation  PICC Double Lumen 04/28/18 PICC Left Brachial 38 cm 0 cm (Active)  Indication for Insertion or Continuance of Line Poor Vasculature-patient has had multiple peripheral attempts or PIVs lasting less than 24 hours 04/28/2018  5:00 PM  Exposed Catheter (cm) 0 cm 04/28/2018  5:00 PM  Site Assessment Clean;Dry;Intact 04/28/2018  5:00 PM  Lumen #1 Status Flushed;Blood return noted;Saline locked 04/28/2018  5:00 PM  Lumen #2 Status Flushed;Blood return noted;Saline locked 04/28/2018  5:00 PM  Dressing Type Transparent 04/28/2018  5:00 PM  Dressing Status Clean;Dry;Intact;Antimicrobial disc in place 04/28/2018  5:00 PM  Line Care Connections checked and tightened 04/28/2018  5:00 PM  Dressing Change Due 05/05/18 04/28/2018  5:00 PM       Lorenza Cambridge 04/28/2018, 5:12 PM

## 2018-04-28 NOTE — Progress Notes (Addendum)
Pt  Agitated/anxious/ combative, unable to be redirected byRNs/ unable to follow commands, Pt attempting to pull ET tube/ lines, arms swinging/ trying to get OOB. Rass of 3/4. HR-140s. Safety issue for Pt and RN. Notified E-link RN-- MD orders given for bilateral soft wrist restraints.  @0440 - Pt extremely agitated/ combative with turns, bathing & mouthcare. Rass 3/4. Pt becomes tachypnic- HR 140s-  despite pre medicating with PRNS/bolus/ (gtts all maxed). The  increased agitation/ anxiety Pt experiences with outside stimulus may require sedation change.

## 2018-04-28 NOTE — Progress Notes (Signed)
PT Cancellation Note  Patient Details Name: Kyle Lowery MRN: [id number] DOB: 1950-12-09   Cancelled Treatment:    Reason Eval/Treat Not Completed: Medical issues which prohibited therapy. Patient remains intubated and sedated. Please reconsult for PT evaluation when appropriate. Thank you.  Mabeline Caras, PT, DPT Acute Rehabilitation Services  Pager [phone] Office [place] 04/28/2018, 9:45 AM

## 2018-04-28 NOTE — Progress Notes (Signed)
CRITICAL VALUE ALERT  Critical Value:  Potassium 3.5, Magnesium 1.8, Phosphorus 2.4   Date & Time Notied:  04/28/18 0352  Provider Notified: Warren Lacy   Orders Received/Actions taken: Awaiting orders electrolyte replacement.

## 2018-04-28 NOTE — Progress Notes (Signed)
Sedation vacation done patient awoke and sitting up, as time went on ( 15 minutes) became more agitated, flinging arms up and sitting up/laying down, work of breathing elevated, saturations decreased, started stacking breaths, turned on precedex at 0.6 hoping to calm patient, but, was unable to calm. Placed back on full sedation, gave bolus of fentanyl, increasing fentanyl in increments. Medicated with versed, able to decrease agitation. Increased FI02 to 60%. Took approx 45 minutes to get patient comfortable again.where he was not stacking breaths and o2 level was > 90% sats.

## 2018-04-28 NOTE — Plan of Care (Signed)
Unable to wean failed sedation vacation  stacking breaths, deoxygenation  hemodynamics fairly stable decrease BP with versed, saturation borderline on 40 % any turning or movement causes agitation which then increases oxygen consumption. Received PICC line today

## 2018-04-28 NOTE — Progress Notes (Signed)
NAME:  Kyle Lowery, MRN:  [id number], DOB:  1950-06-15, LOS: 6 ADMISSION DATE:  04/22/2018, CONSULTATION DATE:  04/22/2018 REFERRING MD:  Dr. Roderic Lowery, CHIEF COMPLAINT:  Respiratory Distress    History of present illness   68 year old male with reported 2 weeks of cough, congestion, febrile, recently went to PCP, given Z-Pak. Admitted to [facility] 3/10-3/12 with flu-like symptoms, given Tamiflu. Arrives to [facility] ED on 3/15 with A.Fib RVR, hypokalemia, hypoxia, and dyspnea. CXR with bilateral infiltrates requiring intubation. Transferred to [facility] for further evaluation.   Per wife patient went home on 3/12 however was still not feeling normal (she thinks he left against medical advice). For the past few days patient has been weak and short of breath with very poor appetite.   Who was extubated on 3/17 and had to be reintubated on 3/18  Past Medical History  CAD, COPD, Depression   Significant Hospital Events   3/15 > Presents to [facility] ED > Transferred to [facility]  3/17 > Extubated 3/18 > Re-intubated for increased WOB Consults:  PCCM   Procedures:  ETT 3/15 > 3/17  Significant Diagnostic Tests:  CXR 3/15 > Portion of lateral right base not visualized. Infiltrate noted in visualized right base. Endotracheal tube as described without pneumothorax. Stable cardiac silhouette.  Micro Data:  Tracheal Asp 3/15 >> Moderate Haemophilus influenzae Flu PCR 3/15 > FLU A positive  Blood 3/15 >> Urine 3/15 >>  RVP 3/15>>> neg   Antimicrobials:  Cefepime 3/15 >>> Vancomycin 3/15  >>> Azithromycin 3/16 >>> Tamiflu 3/16>>>  Interim history/subjective:  Reintubated on 3/18 On vent, gets agitated, reorients okay for the nurses  Objective   Blood pressure 111/61, pulse 83, temperature (!) 96.1 F (35.6 C), temperature source Oral, resp. rate 20, height 5\' 8"  (1.727 m), weight 64.4 kg, SpO2 98 %.    Vent Mode: PRVC FiO2 (%):  [40 %] 40 % Set Rate:  [20 bmp] 20 bmp Vt Set:  [580 mL] 580  mL PEEP:  [5 cmH20] 5 cmH20 Plateau Pressure:  [18 cmH20-22 cmH20] 21 cmH20   Intake/Output Summary (Last 24 hours) at 04/28/2018 1129 Last data filed at 04/28/2018 0800 Gross per 24 hour  Intake 4162.03 ml  Output 1120 ml  Net 3042.03 ml   Filed Weights   04/26/18 0414 04/27/18 0238 04/28/18 0232  Weight: 63.5 kg 62.3 kg 64.4 kg   Underweight, not in distress Moist oral mucosa Chest equal movement, decreased air movement at the bases S1-S2 appreciated Bowel sounds appreciated Extremities shows no clubbing, no edema He is sedated at present   Resolved Hospital Problem list   Lactic acidosis Hypotension secondary to sedation  Assessment & Plan:  68 year old male with AHRF secondary to influenza and Haemophilus influenzae in setting of COPD who remains in ICU for agitation requiring sedation. Extubated yesterday  Acute hypercarbic and hypoxemic respiratory failure COPD exacerbation Influenza pneumonia Haemophilus influenza pneumonia  Continue full vent support We will complete course of Tamiflu Complete course of antibiotics Continue steroids Continue bronchodilators  ICU agitation/delirium Started on Seroquel-started on 25 twice daily Cautiously wean sedation   Acute kidney injury Improving Adequate urine output  History of coronary artery disease Hypertension -On metoprolol On amlodipine Start aspirin  Hypernatremia increase free water via OG tube 300 cc every 6  Best practice:  Diet: TF Pain/Anxiety/Delirium protocol (if indicated): Fentanyl/Precedex VAP protocol (if indicated) DVT prophylaxis: Lovenox GI prophylaxis:  Glucose control: SSI Mobility: PT/OT Code Status: FC  Family Communication: No family at bedside. Continue ICU management  Labs   CBC: Recent Labs  Lab 04/22/18 1430  04/24/18 0624 04/25/18 0049 04/26/18 0248 04/26/18 0336 04/27/18 0724 04/28/18 0226  WBC 6.4   < > 9.9 13.8*  --  13.9* 10.1 10.5  NEUTROABS 5.2  --   --    --   --   --   --  8.6*  HGB 14.1   < > 10.1* 9.5* 10.2* 10.9* 10.7* 9.3*  HCT 40.6   < > 31.4* 29.5* 30.0* 32.9* 33.6* 28.1*  MCV 96.7   < > 99.7 100.3*  --  100.6* 102.1* 101.1*  PLT 214   < > 108* 101*  --  116* 118* 114*   < > = values in this interval not displayed.    Basic Metabolic Panel: Recent Labs  Lab 04/24/18 0529 04/25/18 0049 04/25/18 2146 04/26/18 0248 04/26/18 0336 04/27/18 0724 04/28/18 0226  NA 137 144 144 147* 146* 150* 147*  K 3.6 3.6 3.7 3.7 3.9 3.6 3.5  CL 111 115* 115*  --  114* 116* 116*  CO2 18* 22 22  --  22 22 25   GLUCOSE 159* 132* 152*  --  124* 118* 92  BUN 48* 50* 43*  --  43* 46* 45*  CREATININE 1.45* 1.38* 1.13  --  1.18 0.96 0.89  CALCIUM 6.9* 7.4* 7.4*  --  7.2* 7.5* 7.3*  MG 2.0 1.9  --   --  2.1 2.1 1.8  PHOS 2.7 2.1* 2.6  --  1.8* 1.7* 2.4*   GFR: Estimated Creatinine Clearance: 74.4 mL/min (by C-G formula based on SCr of 0.89 mg/dL). Recent Labs  Lab 04/22/18 2129 04/23/18 0053 04/23/18 0511  04/25/18 0049 04/26/18 0336 04/27/18 0724 04/28/18 0226  PROCALCITON 2.93  --   --   --   --   --   --   --   WBC  --  9.4  --    < > 13.8* 13.9* 10.1 10.5  LATICACIDVEN 5.8* 4.6* 2.5*  --  2.2*  --   --   --    < > = values in this interval not displayed.    Liver Function Tests: Recent Labs  Lab 04/22/18 1430 04/25/18 2146  AST 21  --   ALT 15  --   ALKPHOS 45  --   BILITOT 1.8*  --   PROT 6.6  --   ALBUMIN 2.8* 2.1*   No results for input(s): LIPASE, AMYLASE in the last 168 hours. No results for input(s): AMMONIA in the last 168 hours.  ABG    Component Value Date/Time   PHART 7.402 04/26/2018 0248   PCO2ART 42.1 04/26/2018 0248   PO2ART 151.0 (H) 04/26/2018 0248   HCO3 26.2 04/26/2018 0248   TCO2 27 04/26/2018 0248   ACIDBASEDEF 1.4 04/25/2018 2320   O2SAT 99.0 04/26/2018 0248     Coagulation Profile: No results for input(s): INR, PROTIME in the last 168 hours.  Cardiac Enzymes: Recent Labs  Lab 04/22/18  1430 04/22/18 2129  TROPONINI 0.03* 0.16*    HbA1C: No results found for: HGBA1C  CBG: Recent Labs  Lab 04/27/18 1132 04/27/18 2001 04/27/18 2357 04/28/18 0426 04/28/18 0804  GLUCAP 170* 155* 93 86 115*   Critical careTime spent evaluating patient, reviewing records, formulating plan of care 55minutes   Kosta Schnitzler Md

## 2018-04-28 NOTE — Progress Notes (Signed)
[facility] Progress Note Patient Name: Kyle Lowery DOB: May 15, 1950 MRN: [id number]   Date of Service  04/28/2018  HPI/Events of Note  Agitation - Request for bilateral soft wrist restraints.   eICU Interventions  Will order: 1. Bilateral soft wrist restraints.      Intervention Category Major Interventions: Delirium, psychosis, severe agitation - evaluation and management  Spiro Ausborn Eugene 04/28/2018, 9:45 PM

## 2018-04-29 DIAGNOSIS — J9600 Acute respiratory failure, unspecified whether with hypoxia or hypercapnia: Secondary | ICD-10-CM

## 2018-04-29 LAB — CBC WITH DIFFERENTIAL/PLATELET
Abs Immature Granulocytes: 0.19 K/uL — ABNORMAL HIGH (ref 0.00–0.07)
Basophils Absolute: 0 K/uL (ref 0.0–0.1)
Basophils Relative: 0 %
Eosinophils Absolute: 0 K/uL (ref 0.0–0.5)
Eosinophils Relative: 0 %
HCT: 26.7 % — ABNORMAL LOW (ref 39.0–52.0)
Hemoglobin: 8.6 g/dL — ABNORMAL LOW (ref 13.0–17.0)
Immature Granulocytes: 2 %
Lymphocytes Relative: 7 %
Lymphs Abs: 0.7 K/uL (ref 0.7–4.0)
MCH: 34.3 pg — ABNORMAL HIGH (ref 26.0–34.0)
MCHC: 32.2 g/dL (ref 30.0–36.0)
MCV: 106.4 fL — ABNORMAL HIGH (ref 80.0–100.0)
Monocytes Absolute: 0.5 K/uL (ref 0.1–1.0)
Monocytes Relative: 4 %
Neutro Abs: 9.9 K/uL — ABNORMAL HIGH (ref 1.7–7.7)
Neutrophils Relative %: 87 %
Platelets: 97 K/uL — ABNORMAL LOW (ref 150–400)
RBC: 2.51 MIL/uL — ABNORMAL LOW (ref 4.22–5.81)
RDW: 17.5 % — ABNORMAL HIGH (ref 11.5–15.5)
WBC: 11.4 K/uL — ABNORMAL HIGH (ref 4.0–10.5)
nRBC: 0.4 % — ABNORMAL HIGH (ref 0.0–0.2)

## 2018-04-29 LAB — BASIC METABOLIC PANEL WITH GFR
Anion gap: 4 — ABNORMAL LOW (ref 5–15)
BUN: 45 mg/dL — ABNORMAL HIGH (ref 8–23)
CO2: 25 mmol/L (ref 22–32)
Calcium: 7 mg/dL — ABNORMAL LOW (ref 8.9–10.3)
Chloride: 117 mmol/L — ABNORMAL HIGH (ref 98–111)
Creatinine, Ser: 0.79 mg/dL (ref 0.61–1.24)
GFR calc Af Amer: >60 mL/min
GFR calc non Af Amer: >60 mL/min
Glucose, Bld: 97 mg/dL (ref 70–99)
Potassium: 4.2 mmol/L (ref 3.5–5.1)
Sodium: 146 mmol/L — ABNORMAL HIGH (ref 135–145)

## 2018-04-29 LAB — GLUCOSE, CAPILLARY
Glucose-Capillary: 97 mg/dL (ref 70–99)
Glucose-Capillary: 102 mg/dL — ABNORMAL HIGH (ref 70–99)
Glucose-Capillary: 81 mg/dL (ref 70–99)
Glucose-Capillary: 82 mg/dL (ref 70–99)
Glucose-Capillary: 72 mg/dL (ref 70–99)
Glucose-Capillary: 74 mg/dL (ref 70–99)

## 2018-04-29 LAB — TRIGLYCERIDES: Triglycerides: 108 mg/dL (ref <150)

## 2018-04-29 MED ORDER — QUETIAPINE FUMARATE 50 MG PO TABS
50.0000 mg | ORAL_TABLET | Freq: Every day | ORAL | Status: DC
  Administered 2018-04-29 – 2018-05-05 (×6): 50 mg via ORAL
  Filled 2018-04-29 (×7): qty 1

## 2018-04-29 MED ORDER — CLONAZEPAM 0.5 MG PO TABS: 0.5000 mg | ORAL_TABLET | Freq: Three times a day (TID) | ORAL | Status: DC

## 2018-04-29 MED ORDER — BUDESONIDE 0.5 MG/2ML IN SUSP
0.5000 mg | Freq: Two times a day (BID) | RESPIRATORY_TRACT | Status: DC
  Administered 2018-04-29 – 2018-05-09 (×20): 0.5 mg via RESPIRATORY_TRACT
  Filled 2018-04-29 (×20): qty 2

## 2018-04-29 MED ORDER — CLONAZEPAM 1 MG PO TABS
1.0000 mg | ORAL_TABLET | Freq: Two times a day (BID) | ORAL | Status: DC
  Administered 2018-04-29 – 2018-05-05 (×11): 1 mg
  Filled 2018-04-29 (×12): qty 1

## 2018-04-29 NOTE — Progress Notes (Signed)
Patient awake throughout the day agitated restless, discussed with CCM medications titrated, added to provide comfort, medicated with versed several times IV, this is the medication that works the best at this time for calmness and decreasing agitation and stacking of breaths/dysyncrony with vent. Discussed with wife ( nurse and MD).  She understand visitation restrictions. She brought patient clothes, and cell phone. All are in closet in bag.

## 2018-04-29 NOTE — Progress Notes (Signed)
NAME:  Kyle Lowery, MRN:  [id number], DOB:  [birth year], LOS: 7 ADMISSION DATE:  04/22/2018, CONSULTATION DATE:  04/22/2018 REFERRING MD:  Dr. Roderic Palau, CHIEF COMPLAINT:  Respiratory Distress    History of present illness   68 year old male with reported 2 weeks of cough, congestion, febrile, recently went to PCP, given Z-Pak. Admitted to [facility] 3/10-3/12 with flu-like symptoms, given Tamiflu. Arrives to [facility] ED on 3/15 with A.Fib RVR, hypokalemia, hypoxia, and dyspnea. CXR with bilateral infiltrates requiring intubation. Transferred to [facility] for further evaluation.   Per wife patient went home on 3/12 however was still not feeling normal (she thinks he left against medical advice). For the past few days patient has been weak and short of breath with very poor appetite. Extubated on 3/17 and had to be reintubated on 3/18  Past Medical History  CAD, COPD, Depression   Significant Hospital Events   3/15  Presents to [facility] ED > Transferred to [facility]  3/17  Extubated 3/18  Re-intubated for increased WOB 3/22  Sedation needs / waxing agitated delirium  Consults:  PCCM   Procedures:  ETT 3/15 > 3/17  Significant Diagnostic Tests:  CXR 3/15 > Portion of lateral right base not visualized. Infiltrate noted in visualized right base. Endotracheal tube as described without pneumothorax. Stable cardiac silhouette.  Micro Data:  Tracheal Asp 3/15 >> Moderate Haemophilus influenzae Flu PCR 3/15 >> FLU A positive  Blood 3/15 >> negative RVP 3/15 >> neg   Antimicrobials:  Cefepime 3/15 >> 3/20 Vancomycin 3/15 >> 3/17 Azithromycin 3/16 >> 3/20 Tamiflu 3/16 >> 3/20  Interim history/subjective:  RN reports pt remains on sedation, despite this has periods of agitation mixed with sedation.  QTc prolonged, seroquel held.   Objective   Blood pressure (!) 94/53, pulse 93, temperature 98.9 F (37.2 C), temperature source Axillary, resp. rate 20, height 5\' 8"  (1.727 m), weight 67.6 kg, SpO2  94 %.    Vent Mode: PRVC FiO2 (%):  [40 %-60 %] 40 % Set Rate:  [20 bmp] 20 bmp Vt Set:  [580 mL] 580 mL PEEP:  [5 cmH20] 5 cmH20 Plateau Pressure:  [18 cmH20-21 cmH20] 18 cmH20   Intake/Output Summary (Last 24 hours) at 04/29/2018 0743 Last data filed at 04/29/2018 0600 Gross per 24 hour  Intake 5095.73 ml  Output 1730 ml  Net 3365.73 ml   Filed Weights   04/27/18 0238 04/28/18 0232 04/29/18 0446  Weight: 62.3 kg 64.4 kg 67.6 kg   EXAM General: ill appearing adult male lying in bed on vent in NAD HEENT: MM pink/moist, ETT Neuro: sedate  CV: s1s2 rrr, no m/r/g PULM: even/non-labored, lungs bilaterally clear GI: soft, non-tender, bsx4 active  Extremities: warm/dry, no edema  Skin: no rashes or lesions  Resolved Hospital Problem list   Lactic acidosis Hypotension secondary to sedation  Assessment & Plan:   Acute hypercarbic and hypoxemic respiratory failure Acute Exacerbation of COPD  Influenza A Haemophilus influenza pneumonia P: PRVC 8cc/kg  Wean PEEP/FiO2 for sats >90% Completed abx, steroids  Continue pulmicort, duoneb, PRN albuterol Follow intermittent CXR  ICU Agitation / Delirium P: Hold seroquel with prolonged QTc (RN has held) Wean IV sedation as able  Increase klonopin to 0.5 mg PT Q8   Acute kidney injury P: Trend BMP / urinary output Replace electrolytes as indicated Avoid nephrotoxic agents, ensure adequate renal perfusion  History of coronary artery disease Hypertension P: Lopressor 25 mg BID  Norvasc 10 mg QD ASA  PT  Hypernatremia P: Continue free water 324ml Q6 Follow Na trend   Best practice:  Diet: TF Pain/Anxiety/Delirium protocol (if indicated): Fentanyl/Precedex VAP protocol (if indicated) DVT prophylaxis: Lovenox GI prophylaxis:  Glucose control: SSI Mobility: PT/OT Code Status: FC Family Communication: No family at bedside on am rounds 3/22 Continue ICU    Labs   CBC: Recent Labs  Lab 04/22/18 1430  04/25/18  0049 04/26/18 0248 04/26/18 0336 04/27/18 0724 04/28/18 0226 04/29/18 0145  WBC 6.4   < > 13.8*  --  13.9* 10.1 10.5 11.4*  NEUTROABS 5.2  --   --   --   --   --  8.6* 9.9*  HGB 14.1   < > 9.5* 10.2* 10.9* 10.7* 9.3* 8.6*  HCT 40.6   < > 29.5* 30.0* 32.9* 33.6* 28.1* 26.7*  MCV 96.7   < > 100.3*  --  100.6* 102.1* 101.1* 106.4*  PLT 214   < > 101*  --  116* 118* 114* 97*   < > = values in this interval not displayed.    Basic Metabolic Panel: Recent Labs  Lab 04/24/18 0529 04/25/18 0049 04/25/18 2146 04/26/18 0248 04/26/18 0336 04/27/18 0724 04/28/18 0226 04/29/18 0145  NA 137 144 144 147* 146* 150* 147* 146*  K 3.6 3.6 3.7 3.7 3.9 3.6 3.5 4.2  CL 111 115* 115*  --  114* 116* 116* 117*  CO2 18* 22 22  --  22 22 25 25   GLUCOSE 159* 132* 152*  --  124* 118* 92 97  BUN 48* 50* 43*  --  43* 46* 45* 45*  CREATININE 1.45* 1.38* 1.13  --  1.18 0.96 0.89 0.79  CALCIUM 6.9* 7.4* 7.4*  --  7.2* 7.5* 7.3* 7.0*  MG 2.0 1.9  --   --  2.1 2.1 1.8  --   PHOS 2.7 2.1* 2.6  --  1.8* 1.7* 2.4*  --    GFR: Estimated Creatinine Clearance: 86.8 mL/min (by C-G formula based on SCr of 0.79 mg/dL). Recent Labs  Lab 04/22/18 2129 04/23/18 0053 04/23/18 0511  04/25/18 0049 04/26/18 0336 04/27/18 0724 04/28/18 0226 04/29/18 0145  PROCALCITON 2.93  --   --   --   --   --   --   --   --   WBC  --  9.4  --    < > 13.8* 13.9* 10.1 10.5 11.4*  LATICACIDVEN 5.8* 4.6* 2.5*  --  2.2*  --   --   --   --    < > = values in this interval not displayed.    Liver Function Tests: Recent Labs  Lab 04/22/18 1430 04/25/18 2146  AST 21  --   ALT 15  --   ALKPHOS 45  --   BILITOT 1.8*  --   PROT 6.6  --   ALBUMIN 2.8* 2.1*   No results for input(s): LIPASE, AMYLASE in the last 168 hours. No results for input(s): AMMONIA in the last 168 hours.  ABG    Component Value Date/Time   PHART 7.402 04/26/2018 0248   PCO2ART 42.1 04/26/2018 0248   PO2ART 151.0 (H) 04/26/2018 0248   HCO3 26.2  04/26/2018 0248   TCO2 27 04/26/2018 0248   ACIDBASEDEF 1.4 04/25/2018 2320   O2SAT 99.0 04/26/2018 0248     Coagulation Profile: No results for input(s): INR, PROTIME in the last 168 hours.  Cardiac Enzymes: Recent Labs  Lab 04/22/18 1430 04/22/18 2129  TROPONINI 0.03*  0.16*    HbA1C: No results found for: HGBA1C  CBG: Recent Labs  Lab 04/28/18 0804 04/28/18 1149 04/28/18 1958 04/28/18 2322 04/29/18 0402  GLUCAP 115* 103* 79 90 97   CC Time: 35 minutes   Noe Gens, NP-C [facility] Pgr: [phone] or if no answer [phone] 04/29/2018, 7:43 AM

## 2018-04-29 NOTE — Progress Notes (Signed)
68 year old man with severe COPD admitted 3/15 for influenza and Haemophilus pneumonia with bilateral infiltrates requiring mechanical ventilation.  He failed extubation in 3/17 Current attempts to wean have been impeded by agitation  On exam-sedated with Precedex and fentanyl drip high-dose, requiring intermittent Versed due to agitation, RA SS ranging from +2 to -1, prolonged expiration on exam and on ventilator waveform, few rhonchi on left, no crackles, S1-S2 regular on monitor with intermittent APCs, Soft nontender abdomen, 1+ edema.  Chest x-ray 3/21 personally reviewed which shows bilateral hazy infiltrates, perihilar suggestive edema.  Labs show normal electrolytes, mild leukocytosis and stable anemia  Impression/plan Acute hypercarbic respiratory failure-continue spontaneous breathing trials with goal extubation.  Acute encephalopathy/delirium-increase Klonopin 1 mg twice daily Seroquel limited by prolonged QTC but will restart at 50 daily  Influenza A/Haemophilus pneumonia-completed antibiotics, Tamiflu   Wife updated at bedside.  The patient is critically ill with multiple organ systems failure and requires high complexity decision making for assessment and support, frequent evaluation and titration of therapies, application of advanced monitoring technologies and extensive interpretation of multiple databases. Critical Care Time devoted to patient care services described in this note independent of APP/resident  time is 33 minutes.   Leanna Sato Elsworth Soho MD

## 2018-04-30 ENCOUNTER — Inpatient Hospital Stay (HOSPITAL_COMMUNITY): Payer: BLUE CROSS/BLUE SHIELD

## 2018-04-30 LAB — HEPATIC FUNCTION PANEL
ALT: 23 U/L (ref 0–44)
AST: 20 U/L (ref 15–41)
Albumin: 1.3 g/dL — ABNORMAL LOW (ref 3.5–5.0)
Alkaline Phosphatase: 66 U/L (ref 38–126)
Bilirubin, Direct: 0.1 mg/dL (ref 0.0–0.2)
Indirect Bilirubin: 0.5 mg/dL (ref 0.3–0.9)
Total Bilirubin: 0.6 mg/dL (ref 0.3–1.2)
Total Protein: 3.8 g/dL — ABNORMAL LOW (ref 6.5–8.1)

## 2018-04-30 LAB — BASIC METABOLIC PANEL
Anion gap: 5 (ref 5–15)
BUN: 42 mg/dL — ABNORMAL HIGH (ref 8–23)
CO2: 25 mmol/L (ref 22–32)
Calcium: 6.8 mg/dL — ABNORMAL LOW (ref 8.9–10.3)
Chloride: 115 mmol/L — ABNORMAL HIGH (ref 98–111)
Creatinine, Ser: 0.79 mg/dL (ref 0.61–1.24)
GFR calc non Af Amer: >60 mL/min (ref >60)
Glucose, Bld: 106 mg/dL — ABNORMAL HIGH (ref 70–99)
Potassium: 3.8 mmol/L (ref 3.5–5.1)
Sodium: 145 mmol/L (ref 135–145)

## 2018-04-30 LAB — GLUCOSE, CAPILLARY
GLUCOSE-CAPILLARY: 90 mg/dL (ref 70–99)
GLUCOSE-CAPILLARY: 98 mg/dL (ref 70–99)
Glucose-Capillary: 38 mg/dL — CL (ref 70–99)
Glucose-Capillary: 104 mg/dL — ABNORMAL HIGH (ref 70–99)
Glucose-Capillary: 94 mg/dL (ref 70–99)
Glucose-Capillary: 93 mg/dL (ref 70–99)
Glucose-Capillary: 88 mg/dL (ref 70–99)

## 2018-04-30 MED ORDER — METOPROLOL TARTRATE 12.5 MG HALF TABLET
12.5000 mg | ORAL_TABLET | Freq: Every day | ORAL | Status: DC
  Administered 2018-05-02 – 2018-05-03 (×2): 12.5 mg via ORAL
  Filled 2018-04-30 (×3): qty 1

## 2018-04-30 MED ORDER — ETOMIDATE 2 MG/ML IV SOLN
40.0000 mg | Freq: Once | INTRAVENOUS | Status: AC
  Administered 2018-05-01: 20 mg via INTRAVENOUS

## 2018-04-30 MED ORDER — MIDAZOLAM HCL 2 MG/2ML IJ SOLN
5.0000 mg | Freq: Once | INTRAMUSCULAR | Status: AC
  Administered 2018-05-01: 2 mg via INTRAVENOUS
  Filled 2018-04-30: qty 6

## 2018-04-30 MED ORDER — FENTANYL CITRATE (PF) 2500 MCG/50ML IJ SOLN
0.0000 ug/h | Status: DC
  Administered 2018-04-30: 300 ug/h via INTRAVENOUS
  Administered 2018-05-01: 275 ug/h via INTRAVENOUS
  Administered 2018-05-01: 300 ug/h via INTRAVENOUS
  Administered 2018-05-02: 200 ug/h via INTRAVENOUS
  Administered 2018-05-02: 350 ug/h via INTRAVENOUS
  Filled 2018-04-30 (×6): qty 100

## 2018-04-30 MED ORDER — FUROSEMIDE 10 MG/ML IJ SOLN
40.0000 mg | Freq: Four times a day (QID) | INTRAMUSCULAR | Status: AC
  Administered 2018-04-30 – 2018-05-01 (×3): 40 mg via INTRAVENOUS
  Filled 2018-04-30 (×3): qty 4

## 2018-04-30 MED ORDER — POTASSIUM CHLORIDE 20 MEQ/15ML (10%) PO SOLN
40.0000 meq | Freq: Three times a day (TID) | ORAL | Status: AC
  Administered 2018-04-30 (×2): 40 meq
  Filled 2018-04-30 (×2): qty 30

## 2018-04-30 MED ORDER — PROPOFOL 10 MG/ML IV BOLUS: 500.0000 mg | Freq: Once | INTRAVENOUS | Status: DC

## 2018-04-30 MED ORDER — CHLORHEXIDINE GLUCONATE CLOTH 2 % EX PADS
6.0000 | MEDICATED_PAD | Freq: Every day | CUTANEOUS | Status: DC
  Administered 2018-05-01 – 2018-05-09 (×10): 6 via TOPICAL

## 2018-04-30 MED ORDER — DEXTROSE 50 % IV SOLN
50.0000 mL | Freq: Once | INTRAVENOUS | Status: AC
  Administered 2018-04-30: 50 mL via INTRAVENOUS
  Filled 2018-04-30: qty 50

## 2018-04-30 MED ORDER — METOCLOPRAMIDE HCL 5 MG/5ML PO SOLN: 5.0000 mg | Freq: Once | ORAL | Status: DC

## 2018-04-30 MED ORDER — METOCLOPRAMIDE HCL 5 MG/ML IJ SOLN
5.0000 mg | Freq: Once | INTRAMUSCULAR | Status: AC
  Administered 2018-04-30: 5 mg via INTRAVENOUS
  Filled 2018-04-30: qty 2

## 2018-04-30 MED ORDER — VECURONIUM BROMIDE 10 MG IV SOLR
10.0000 mg | Freq: Once | INTRAVENOUS | Status: AC
  Administered 2018-05-01: 10 mg via INTRAVENOUS

## 2018-04-30 MED ORDER — FENTANYL CITRATE (PF) 100 MCG/2ML IJ SOLN
200.0000 ug | Freq: Once | INTRAMUSCULAR | Status: AC
  Administered 2018-05-01: 100 ug via INTRAVENOUS

## 2018-04-30 NOTE — Progress Notes (Signed)
Patient with periods of awakefullness and agitation swatting air with arms, not attempting to pull out tubes, but restless throwing legs out of bed.  Not looking at speaker. uinable to tell if following commands, will put arms down sometimes when told other times not. Mitts on for protection.  Foley taken out, did not void after 2 hours and lasix given, bladder scan revealed > 650. Straight cath with 700 out. Condom catheter reapplied. Had large amount of residual from Feeding tube held tube feeding. Blood sugar remains stable.

## 2018-04-30 NOTE — Progress Notes (Signed)
Pt has been very agitated through out the night, HR in 140's contacted CCM and RN camera in and she will relay to MD, gave another 2 versed and increase fentanlly. Continue to monitor.

## 2018-04-30 NOTE — Progress Notes (Signed)
D/W IC nurse- day 8 post Positive influenza test no fever several days.  Discontinued droplet percautions

## 2018-04-30 NOTE — Progress Notes (Signed)
Inpatient Diabetes Program Recommendations  AACE/ADA: New Consensus Statement on Inpatient Glycemic Control (2015)  Target Ranges:  Prepandial:   less than 140 mg/dL      Peak postprandial:   less than 180 mg/dL (1-2 hours)      Critically ill patients:  140 - 180 mg/dL   Lab Results  Component Value Date   GLUCAP 104 (H) 04/30/2018    Review of Glycemic Control Results for Kyle Lowery, Kyle Lowery (MRN [id number]) as of 04/30/2018 11:49  Ref. Range 04/29/2018 21:59 04/30/2018 00:44 04/30/2018 01:13 04/30/2018 04:15 04/30/2018 07:48  Glucose-Capillary Latest Ref Range: 70 - 99 mg/dL 74 38 (LL) 90 98 104 (H)   Diabetes history: None Outpatient Diabetes medications: None Current orders for Inpatient glycemic control:  Novolog 1-3 q 4 hours  Inpatient Diabetes Program Recommendations:    Please d/c Novolog correction.   Thanks,  Adah Perl, RN, BC-ADM Inpatient Diabetes Coordinator Pager [phone] (8a-5p)

## 2018-04-30 NOTE — Progress Notes (Signed)
At beginning of shift, pt very agitated, sedation cut in half, gave 2mg  Versed, and increase sedation, contacted CCM spoke with Nurse, who camera in, also aware of Blood pressure drops and issues, will continue to give versed q1 to decrease agitation, will continue to monitor through out the night.

## 2018-04-30 NOTE — Progress Notes (Signed)
NAME:  Kyle Lowery, MRN:  [id number], DOB:  10-Nov-1950, LOS: 8 ADMISSION DATE:  04/22/2018, CONSULTATION DATE:  04/22/2018 REFERRING MD:  Dr. Roderic Palau, CHIEF COMPLAINT:  Respiratory Distress    History of present illness   68 year old male with reported 2 weeks of cough, congestion, febrile, recently went to PCP, given Z-Pak. Admitted to [facility] 3/10-3/12 with flu-like symptoms, given Tamiflu. Arrives to [facility] ED on 3/15 with A.Fib RVR, hypokalemia, hypoxia, and dyspnea. CXR with bilateral infiltrates requiring intubation. Transferred to [facility] for further evaluation.   Per wife patient went home on 3/12 however was still not feeling normal (she thinks he left against medical advice). For the past few days patient has been weak and short of breath with very poor appetite. Extubated on 3/17 and had to be reintubated on 3/18  Past Medical History  CAD, COPD, Depression   Significant Hospital Events   3/15  Presents to [facility] ED > Transferred to [facility]  3/17  Extubated 3/18  Re-intubated for increased WOB 3/22  Sedation needs / waxing agitated delirium  Consults:  PCCM   Procedures:  ETT 3/15 > 3/17  Significant Diagnostic Tests:  CXR 3/15 > Portion of lateral right base not visualized. Infiltrate noted in visualized right base. Endotracheal tube as described without pneumothorax. Stable cardiac silhouette.  Micro Data:  Tracheal Asp 3/15 >> Moderate Haemophilus influenzae Flu PCR 3/15 >> FLU A positive  Blood 3/15 >> negative RVP 3/15 >> neg   Antimicrobials:  Cefepime 3/15 >> 3/20 Vancomycin 3/15 >> 3/17 Azithromycin 3/16 >> 3/20 Tamiflu 3/16 >> 3/20  Interim history/subjective:  Extremely agitated overnight requiring high dose sedation  Objective   Blood pressure (!) 100/55, pulse 82, temperature 98.7 F (37.1 C), temperature source Oral, resp. rate 20, height 5\' 8"  (1.727 m), weight 67.6 kg, SpO2 100 %.    Vent Mode: PRVC FiO2 (%):  [40 %] 40 % Set Rate:  [20 bmp]  20 bmp Vt Set:  [580 mL] 580 mL PEEP:  [5 cmH20] 5 cmH20 Plateau Pressure:  [17 cmH20-20 cmH20] 19 cmH20   Intake/Output Summary (Last 24 hours) at 04/30/2018 1139 Last data filed at 04/30/2018 1030 Gross per 24 hour  Intake 2685.14 ml  Output 1560 ml  Net 1125.14 ml   Filed Weights   04/27/18 0238 04/28/18 0232 04/29/18 0446  Weight: 62.3 kg 64.4 kg 67.6 kg   EXAM General: Acutely ill appearing, NAD HEENT: [place]/AT, PERRL, EOM-I and MMM Neuro: Sedate, withdraws all ext to pain CV: RRR, Nl S1/S2 and -M/R/G PULM: Coarse BS diffusely GI: Soft, NT, ND and +BS Extremities: -edema and -tenderness Skin: no rashes or lesions  Resolved Hospital Problem list   Lactic acidosis Hypotension secondary to sedation  Assessment & Plan:   Acute hypercarbic and hypoxemic respiratory failure Acute Exacerbation of COPD  Influenza A Haemophilus influenza pneumonia P: Begin PS trials Tracheostomy on 3/24 at 2 PM Completed abx, steroids, D/C Continue pulmicort, duoneb, PRN albuterol CXR in AM  ICU Agitation / Delirium P: Restart seroquel with prolonged QTc (RN has held) Wean IV sedation as able  Continue klonopin to 0.5 mg PT Q8   Acute kidney injury P: Trend BMP / urinary output Replace electrolytes as indicated Avoid nephrotoxic agents, ensure adequate renal perfusion  History of coronary artery disease Hypertension P: Decrease Lopressor 12.5 mg BID  D/C Norvasc 10 mg QD ASA PT  Hypernatremia P: Continue free water 314ml Q6 Follow Na trend  Will proceed with trach in AM and d/c sedation at that point an evaluate.  Spoke with wife and updated her over the phone and obtained consent for tracheostomy in AM.  Best practice:  Diet: TF Pain/Anxiety/Delirium protocol (if indicated): Fentanyl/Precedex VAP protocol (if indicated) DVT prophylaxis: Lovenox GI prophylaxis:  Glucose control: SSI Mobility: PT/OT Code Status: FC Family Communication: No family at bedside on am  rounds 3/22 Continue ICU    Labs   CBC: Recent Labs  Lab 04/25/18 0049 04/26/18 0248 04/26/18 0336 04/27/18 0724 04/28/18 0226 04/29/18 0145  WBC 13.8*  --  13.9* 10.1 10.5 11.4*  NEUTROABS  --   --   --   --  8.6* 9.9*  HGB 9.5* 10.2* 10.9* 10.7* 9.3* 8.6*  HCT 29.5* 30.0* 32.9* 33.6* 28.1* 26.7*  MCV 100.3*  --  100.6* 102.1* 101.1* 106.4*  PLT 101*  --  116* 118* 114* 97*    Basic Metabolic Panel: Recent Labs  Lab 04/24/18 0529 04/25/18 0049 04/25/18 2146  04/26/18 0336 04/27/18 0724 04/28/18 0226 04/29/18 0145 04/30/18 0407  NA 137 144 144   < > 146* 150* 147* 146* 145  K 3.6 3.6 3.7   < > 3.9 3.6 3.5 4.2 3.8  CL 111 115* 115*  --  114* 116* 116* 117* 115*  CO2 18* 22 22  --  22 22 25 25 25   GLUCOSE 159* 132* 152*  --  124* 118* 92 97 106*  BUN 48* 50* 43*  --  43* 46* 45* 45* 42*  CREATININE 1.45* 1.38* 1.13  --  1.18 0.96 0.89 0.79 0.79  CALCIUM 6.9* 7.4* 7.4*  --  7.2* 7.5* 7.3* 7.0* 6.8*  MG 2.0 1.9  --   --  2.1 2.1 1.8  --   --   PHOS 2.7 2.1* 2.6  --  1.8* 1.7* 2.4*  --   --    < > = values in this interval not displayed.   GFR: Estimated Creatinine Clearance: 86.8 mL/min (by C-G formula based on SCr of 0.79 mg/dL). Recent Labs  Lab 04/25/18 0049 04/26/18 0336 04/27/18 0724 04/28/18 0226 04/29/18 0145  WBC 13.8* 13.9* 10.1 10.5 11.4*  LATICACIDVEN 2.2*  --   --   --   --     Liver Function Tests: Recent Labs  Lab 04/25/18 2146  ALBUMIN 2.1*   No results for input(s): LIPASE, AMYLASE in the last 168 hours. No results for input(s): AMMONIA in the last 168 hours.  ABG    Component Value Date/Time   PHART 7.402 04/26/2018 0248   PCO2ART 42.1 04/26/2018 0248   PO2ART 151.0 (H) 04/26/2018 0248   HCO3 26.2 04/26/2018 0248   TCO2 27 04/26/2018 0248   ACIDBASEDEF 1.4 04/25/2018 2320   O2SAT 99.0 04/26/2018 0248     Coagulation Profile: No results for input(s): INR, PROTIME in the last 168 hours.  Cardiac Enzymes: No results for  input(s): CKTOTAL, CKMB, CKMBINDEX, TROPONINI in the last 168 hours.  HbA1C: No results found for: HGBA1C  CBG: Recent Labs  Lab 04/29/18 2159 04/30/18 0044 04/30/18 0113 04/30/18 0415 04/30/18 0748  GLUCAP 74 38* 90 98 104*   The patient is critically ill with multiple organ systems failure and requires high complexity decision making for assessment and support, frequent evaluation and titration of therapies, application of advanced monitoring technologies and extensive interpretation of multiple databases.   Critical Care Time devoted to patient care services described in this note is  89  Minutes. This time reflects time of care of this signee Dr Jennet Maduro. This critical care time does not reflect procedure time, or teaching time or supervisory time of PA/NP/Med student/Med Resident etc but could involve care discussion time.  Rush Farmer, M.D. [facility]monary/Critical Care Medicine. Pager: [phone]. After hours pager: [phone].

## 2018-05-01 ENCOUNTER — Inpatient Hospital Stay (HOSPITAL_COMMUNITY): Payer: BLUE CROSS/BLUE SHIELD

## 2018-05-01 DIAGNOSIS — E440 Moderate protein-calorie malnutrition: Secondary | ICD-10-CM

## 2018-05-01 LAB — POCT I-STAT 7, (LYTES, BLD GAS, ICA,H+H)
Acid-Base Excess: 4 mmol/L — ABNORMAL HIGH (ref 0.0–2.0)
Bicarbonate: 28.9 mmol/L — ABNORMAL HIGH (ref 20.0–28.0)
Calcium, Ion: 1.16 mmol/L (ref 1.15–1.40)
HEMATOCRIT: 19 % — AB (ref 39.0–52.0)
Hemoglobin: 6.5 g/dL — CL (ref 13.0–17.0)
O2 Saturation: 95 %
PCO2 ART: 43.2 mmHg (ref 32.0–48.0)
Patient temperature: 99.2
Potassium: 4.2 mmol/L (ref 3.5–5.1)
Sodium: 146 mmol/L — ABNORMAL HIGH (ref 135–145)
TCO2: 30 mmol/L (ref 22–32)
pH, Arterial: 7.435 (ref 7.350–7.450)
pO2, Arterial: 75 mmHg — ABNORMAL LOW (ref 83.0–108.0)

## 2018-05-01 LAB — CBC
HCT: 25.5 % — ABNORMAL LOW (ref 39.0–52.0)
HEMOGLOBIN: 8.1 g/dL — AB (ref 13.0–17.0)
MCH: 33.8 pg (ref 26.0–34.0)
MCHC: 31.8 g/dL (ref 30.0–36.0)
MCV: 106.3 fL — ABNORMAL HIGH (ref 80.0–100.0)
Platelets: 136 10*3/uL — ABNORMAL LOW (ref 150–400)
RBC: 2.4 MIL/uL — ABNORMAL LOW (ref 4.22–5.81)
RDW: 16.4 % — ABNORMAL HIGH (ref 11.5–15.5)
WBC: 10.3 10*3/uL (ref 4.0–10.5)
nRBC: 0 % (ref 0.0–0.2)

## 2018-05-01 LAB — POCT I-STAT 4, (NA,K, GLUC, HGB,HCT)
Glucose, Bld: 115 mg/dL — ABNORMAL HIGH (ref 70–99)
HCT: 20 % — ABNORMAL LOW (ref 39.0–52.0)
Hemoglobin: 6.8 g/dL — CL (ref 13.0–17.0)
Potassium: 4.2 mmol/L (ref 3.5–5.1)
Sodium: 147 mmol/L — ABNORMAL HIGH (ref 135–145)

## 2018-05-01 LAB — HEMOGLOBIN AND HEMATOCRIT, BLOOD
HCT: 33.2 % — ABNORMAL LOW (ref 39.0–52.0)
Hemoglobin: 10.3 g/dL — ABNORMAL LOW (ref 13.0–17.0)

## 2018-05-01 LAB — BASIC METABOLIC PANEL
Anion gap: 8 (ref 5–15)
BUN: 42 mg/dL — ABNORMAL HIGH (ref 8–23)
CO2: 27 mmol/L (ref 22–32)
Calcium: 7.2 mg/dL — ABNORMAL LOW (ref 8.9–10.3)
Chloride: 110 mmol/L (ref 98–111)
Creatinine, Ser: 1.13 mg/dL (ref 0.61–1.24)
GFR calc Af Amer: >60 mL/min (ref >60)
GFR calc non Af Amer: >60 mL/min (ref >60)
Glucose, Bld: 138 mg/dL — ABNORMAL HIGH (ref 70–99)
Potassium: 4 mmol/L (ref 3.5–5.1)
Sodium: 145 mmol/L (ref 135–145)

## 2018-05-01 LAB — GLUCOSE, CAPILLARY
Glucose-Capillary: 103 mg/dL — ABNORMAL HIGH (ref 70–99)
Glucose-Capillary: 103 mg/dL — ABNORMAL HIGH (ref 70–99)
Glucose-Capillary: 111 mg/dL — ABNORMAL HIGH (ref 70–99)
Glucose-Capillary: 117 mg/dL — ABNORMAL HIGH (ref 70–99)
Glucose-Capillary: 62 mg/dL — ABNORMAL LOW (ref 70–99)
Glucose-Capillary: 66 mg/dL — ABNORMAL LOW (ref 70–99)
Glucose-Capillary: 89 mg/dL (ref 70–99)
Glucose-Capillary: 92 mg/dL (ref 70–99)

## 2018-05-01 LAB — ABO/RH: ABO/RH(D): O POS

## 2018-05-01 LAB — TRIGLYCERIDES: Triglycerides: 373 mg/dL — ABNORMAL HIGH (ref <150)

## 2018-05-01 LAB — PHOSPHORUS: Phosphorus: 3.7 mg/dL (ref 2.5–4.6)

## 2018-05-01 LAB — PROTIME-INR
INR: 1.4 — ABNORMAL HIGH (ref 0.8–1.2)
Prothrombin Time: 16.6 seconds — ABNORMAL HIGH (ref 11.4–15.2)

## 2018-05-01 LAB — APTT: APTT: 35 s (ref 24–36)

## 2018-05-01 LAB — MAGNESIUM: Magnesium: 1.4 mg/dL — ABNORMAL LOW (ref 1.7–2.4)

## 2018-05-01 LAB — PREPARE RBC (CROSSMATCH)

## 2018-05-01 MED ORDER — HALOPERIDOL LACTATE 5 MG/ML IJ SOLN
4.0000 mg | Freq: Once | INTRAMUSCULAR | Status: AC
  Administered 2018-05-01: 4 mg via INTRAVENOUS
  Filled 2018-05-01: qty 1

## 2018-05-01 MED ORDER — SODIUM CHLORIDE 0.9% IV SOLUTION
Freq: Once | INTRAVENOUS | Status: AC
  Administered 2018-05-02: 12:00:00 via INTRAVENOUS

## 2018-05-01 MED ORDER — PROPOFOL 1000 MG/100ML IV EMUL
INTRAVENOUS | Status: AC
  Administered 2018-05-01: 30 ug/kg/min via INTRAVENOUS
  Filled 2018-05-01: qty 100

## 2018-05-01 MED ORDER — PROPOFOL 1000 MG/100ML IV EMUL
5.0000 ug/kg/min | INTRAVENOUS | Status: DC
  Administered 2018-05-01: 30 ug/kg/min via INTRAVENOUS
  Administered 2018-05-02: 40 ug/kg/min via INTRAVENOUS
  Filled 2018-05-01: qty 100

## 2018-05-01 MED ORDER — PROPOFOL 500 MG/50ML IV EMUL
INTRAVENOUS | Status: AC
  Filled 2018-05-01: qty 50

## 2018-05-01 MED ORDER — DEXTROSE 50 % IV SOLN
INTRAVENOUS | Status: AC
  Administered 2018-05-01: 12.5 g via INTRAVENOUS
  Filled 2018-05-01: qty 50

## 2018-05-01 MED ORDER — MAGNESIUM SULFATE 2 GM/50ML IV SOLN
2.0000 g | Freq: Once | INTRAVENOUS | Status: AC
  Administered 2018-05-01: 2 g via INTRAVENOUS
  Filled 2018-05-01: qty 50

## 2018-05-01 MED ORDER — STERILE WATER FOR INJECTION IJ SOLN
INTRAMUSCULAR | Status: AC
  Filled 2018-05-01: qty 10

## 2018-05-01 MED ORDER — DEXTROSE 10 % IV SOLN
INTRAVENOUS | Status: DC
  Administered 2018-05-01: via INTRAVENOUS

## 2018-05-01 MED ORDER — DEXTROSE 50 % IV SOLN
12.5000 g | INTRAVENOUS | Status: AC
  Administered 2018-05-01: 12.5 g via INTRAVENOUS

## 2018-05-01 MED ORDER — SODIUM CHLORIDE 0.9 % IV BOLUS
1000.0000 mL | Freq: Once | INTRAVENOUS | Status: AC
  Administered 2018-05-01: 1000 mL via INTRAVENOUS

## 2018-05-01 MED ORDER — VECURONIUM BROMIDE 10 MG IV SOLR
INTRAVENOUS | Status: AC
  Administered 2018-05-01: 10 mg via INTRAVENOUS
  Filled 2018-05-01: qty 10

## 2018-05-01 MED ORDER — SODIUM CHLORIDE 0.9% IV SOLUTION
Freq: Once | INTRAVENOUS | Status: AC
  Administered 2018-05-02: 10 mL/h via INTRAVENOUS

## 2018-05-01 MED ORDER — ETOMIDATE 2 MG/ML IV SOLN
INTRAVENOUS | Status: AC
  Administered 2018-05-01: 20 mg via INTRAVENOUS
  Filled 2018-05-01: qty 20

## 2018-05-01 NOTE — Procedures (Signed)
Bronchoscopy Procedure Note Kyle Lowery [id number] 1950/07/15  Procedure: Bronchoscopy Indications: To facilitate percutaneous tracheostomy   Procedure Details Consent: Risks of procedure as well as the alternatives and risks of each were explained to the (patient/caregiver).  Consent for procedure obtained. Time Out: Verified patient identification, verified procedure, site/side was marked, verified correct patient position, special equipment/implants available, medications/allergies/relevent history reviewed, required imaging and test results available.  Performed  In preparation for procedure, patient was given 100% FiO2.  FOB introduced via ETT.  Able to visualize needle and guidewire cannulation of trachea, no evidence of posterior tracheal injury. No bleeding noted from above. FOB then introduced via new trach tube. Good position and no bleeding. Please refer also to Dr Pura Spice procedure note.    Evaluation Hemodynamic Status: BP stable throughout; O2 sats: stable throughout Patient's Current Condition: stable Specimens:  None Complications: No apparent complications Patient did tolerate procedure well.   Baltazar Apo, MD, PhD 05/01/2018, 1:53 PM [facility] [phone] or if no answer [phone]

## 2018-05-01 NOTE — Procedures (Signed)
Percutaneous Tracheostomy Placement  Consent from family.  Patient sedated, paralyzed and position.  Placed on 100% FiO2 and RR matched.  Area cleaned and draped.  Lidocaine/epi injected.  Skin incision done followed by blunt dissection.  Trachea palpated then punctured, catheter passed and visualized bronchoscopically.  Wire placed and visualized.  Catheter removed.  Airway then entered and dilated.  Size 6 cuffed shiley trach placed and visualized bronchoscopically well above carina.  Good volume returns.  Patient tolerated the procedure well without complications.  Minimal blood loss.  CXR ordered and pending.  Wesam G. Yacoub, M.D. [facility]. Pager: [phone]. After hours pager: [phone].  

## 2018-05-01 NOTE — Progress Notes (Signed)
Nutrition Follow-up  RD working remotely.  DOCUMENTATION CODES:   Non-severe (moderate) malnutrition in context of chronic illness  INTERVENTION:   Once enteral access obtained, restart TF: - Vital AF 1.2 @ 60 ml/hr (1440 ml/day)  Tube feeding regimen provides 1728 kcal, 108 grams of protein, and 1168 ml of H2O (100% of needs).  - d/c Vital High Protein orders  NUTRITION DIAGNOSIS:   Moderate Malnutrition related to chronic illness (COPD) as evidenced by mild fat depletion, moderate fat depletion, mild muscle depletion, moderate muscle depletion.  Ongoing, being addressed via TF  GOAL:   Patient will meet greater than or equal to 90% of their needs  Met via TF  MONITOR:   Vent status, Weight trends, TF tolerance, Labs  REASON FOR ASSESSMENT:   Ventilator, Consult Enteral/tube feeding initiation and management  ASSESSMENT:   68 year old male who presented to the ED on 3/15 with flu-like symptoms. PMH of CAD, COPD, depression, GERD, HTN, HLD. Pt intubated in the ED. Admitted with PNA, flu A positive.  3/17 - extubated 3/18 - reintubated 3/24 - trach  Attempted at weaning pt have been impeded by agitation. Trach completed today. TF stopped this AM in anticipation of trach. NG tube placed for enteral access.  Pt's weight has fluctuated since admission but weight of 65.2 kg today is consistent with first measured admission weight of 65.3 kg.  Discussed pt with RN who reports NG tube placement attempted several times but tube kept coiling in esophagus. RN to page MD regarding plan for enteral access. Recommend IR consult to place Cortrak under fluoro as Cortrak team will not be available until Friday.  Current TF orders: Vital High Protein @ 50 ml/hr, free water 300 ml q 6 hours  Patient is currently on ventilator support via trach. MV: 11.2 L/min Temp (24hrs), Avg:98.7 F (37.1 C), Min:98 F (36.7 C), Max:99.4 F (37.4 C) BP: 80/70 MAP: 76  Propofol:  none Precedex: 26 ml/hr Fentanyl: 5 ml/hr NS: 10 ml/hr  Medications reviewed and include: folic acid, SSI q 4 hours, MVI with minerals, Protonix, thiamine  Labs reviewed: sodium 146 (H), BUN 42 (H), magnesium 1.4 (L), hemoglobin 6.5 (L) CBG's: 89, 92, 103, 88, 93 x 24 hours  UOP: 4370 ml x 24 hours I/O's: +6.2 L since admit  Diet Order:   Diet Order            Diet NPO time specified  Diet effective midnight              EDUCATION NEEDS:   Not appropriate for education at this time  Skin:  Skin Assessment: Reviewed RN Assessment  Last BM:  04/28/18  Height:   Ht Readings from Last 1 Encounters:  04/22/18 _0  (1.727 m)    Weight:   Wt Readings from Last 1 Encounters:  05/01/18 65.2 kg    Ideal Body Weight:  70 kg  BMI:  Body mass index is 21.86 kg/m.  Estimated Nutritional Needs:   Kcal:  9150  Protein:  95-110 grams  Fluid:  1.6-1.8 L    Gaynell Face, MS, RD, LDN Inpatient Clinical Dietitian Pager: [phone] Weekend/After Hours: [phone]

## 2018-05-01 NOTE — Plan of Care (Signed)
  Problem: Elimination: Goal: Will not experience complications related to urinary retention Outcome: Progressing   Problem: Safety: Goal: Ability to remain free from injury will improve Outcome: Progressing   Problem: Respiratory: Goal: Ability to maintain a clear airway will improve Outcome: Progressing Goal: Levels of oxygenation will improve Outcome: Progressing Goal: Ability to maintain adequate ventilation will improve Outcome: Progressing   Problem: Respiratory: Goal: Ability to maintain a clear airway and adequate ventilation will improve Outcome: Progressing

## 2018-05-01 NOTE — Progress Notes (Signed)
SLP Cancellation Note  Patient Details Name: Kyle Lowery MRN: [id number] DOB: Sep 21, 1950   Cancelled treatment:       Reason Eval/Treat Not Completed: Other (comment) Patient with new tracheostomy. Orders for SLP eval and treat for PMSV and swallowing received. Will follow pt closely for readiness for SLP interventions as appropriate.     Venita Sheffield Meghin Thivierge 05/01/2018, 2:39 PM  Pollyann Glen, M.A. [place] Acute Environmental education officer [phone] Office [phone]

## 2018-05-01 NOTE — Progress Notes (Signed)
[facility] Progress Note Patient Name: Kyle Lowery DOB: [birth year] MRN: [id number]   Date of Service  05/01/2018  HPI/Events of Note  Hypoglycemia off tube feeds.  eICU Interventions  D 10 W infusion @ 50 ml/ hr        [name] 05/01/2018, 11:23 PM

## 2018-05-01 NOTE — Progress Notes (Signed)
Pt became very agitated and delirious, HR 170s to 180's, and trying to get of bed, disconnected vent hose from ett, nurse and tech to hold patient down, contacted CCM (elink), MD and Nurse camera in and new orders were placed and meds given (see MAR). Patient settled down after about 83min, and blood pressure drop 51/43 map 48, decrease sedation and contacted Nurse at [facility] to notify of the situation, will continue to monitor.

## 2018-05-01 NOTE — Progress Notes (Addendum)
[facility] Progress Note Patient Name: Kyle Lowery DOB: [birth year] MRN: [id number]   Date of Service  05/01/2018  HPI/Events of Note  Multiple issues: 1. Hypotension - BP = 75/46 with MAP = 57. LVEF = 55% to 60% and 2. Anemia - Hgb = 8.1.  eICU Interventions  Will order: 1. Bolus with 0.9 NaCl 1 liter IV over 1 hour now.      Intervention Category Major Interventions: Hypotension - evaluation and management;Other:  Zyheir Daft Cornelia Copa 05/01/2018, 6:14 AM

## 2018-05-01 NOTE — Progress Notes (Signed)
[facility] Progress Note Patient Name: Kyle Lowery DOB: Feb 27, 1950 MRN: [id number]   Date of Service  05/01/2018  HPI/Events of Note  Agitation on max dose Precedex + 400 mcg of Fentanyl. Last TG 200 mg %  eICU Interventions  Switch patient to Diprivan, check Triglycerides, add prn Tylenol for possible breakthrough pain (he was recently trached)        Frederik Pear 05/01/2018, 8:52 PM

## 2018-05-01 NOTE — Progress Notes (Addendum)
NG placed s/p tracheostomy. NG in esophagus on XRAY; tube advanced but remained in esophagus. NG found to be coiled; removed and attempted re-insertion, but continued to have issues with coiling. Unable to get tube in stomach. RD recommends cortrak placement under fluoro. MD paged.    43 spoke with MD who instructed RN to see if tube could be placed under fluoro, if not he stated he would place tube himself tomorrow.     [place] with Radiology who stated they would be able to place cortrak tomorrow morning.

## 2018-05-01 NOTE — Procedures (Signed)
Bedside Tracheostomy Insertion Procedure Note   Patient Details:   Name: Kyle Lowery DOB: [birth year] MRN: [id number]  Procedure: Tracheostomy  Pre Procedure Assessment: ET Tube Size: 7.5 ET Tube secured at lip (cm): 23 Bite block in place: No Breath Sounds: Clear  Post Procedure Assessment: BP 124/65   Pulse 77   Temp 98.2 F (36.8 C) (Oral)   Resp 20   Ht 5\' 8"  (1.727 m)   Wt 65.2 kg   SpO2 100%   BMI 21.86 kg/m  O2 sats: stable throughout Complications: No apparent complications Patient did tolerate procedure well Tracheostomy Brand:Shiley Tracheostomy Style:Cuffed Tracheostomy Size: 6.0 Tracheostomy Secured ENM:[id number], velcro Tracheostomy Placement Confirmation:Trach cuff visualized and in place and Chest X ray ordered for placement    Mickie Hillier 05/01/2018, 2:40 PM

## 2018-05-01 NOTE — Progress Notes (Signed)
NAME:  Kyle Lowery, MRN:  [id number], DOB:  03/23/1950, LOS: 9 ADMISSION DATE:  04/22/2018, CONSULTATION DATE:  04/22/2018 REFERRING MD:  Dr. Roderic Palau, CHIEF COMPLAINT:  Respiratory Distress    History of present illness   68 year old male with reported 2 weeks of cough, congestion, febrile, recently went to PCP, given Z-Pak. Admitted to [facility] 3/10-3/12 with flu-like symptoms, given Tamiflu. Arrives to [facility] ED on 3/15 with A.Fib RVR, hypokalemia, hypoxia, and dyspnea. CXR with bilateral infiltrates requiring intubation. Transferred to [facility] for [facility] evaluation.   Per wife patient went home on 3/12 however was still not feeling normal (she thinks he left against medical advice). For the past few days patient has been weak and short of breath with very poor appetite. Extubated on 3/17 and had to be reintubated on 3/18  Past Medical History  CAD, COPD, Depression   Significant Hospital Events   3/15  Presents to [facility] ED > Transferred to [facility]  3/17  Extubated 3/18  Re-intubated for increased WOB 3/22  Sedation needs / waxing agitated delirium  Consults:  PCCM   Procedures:  ETT 3/15 > 3/17  Significant Diagnostic Tests:  CXR 3/15 > Portion of lateral right base not visualized. Infiltrate noted in visualized right base. Endotracheal tube as described without pneumothorax. Stable cardiac silhouette.  Micro Data:  Tracheal Asp 3/15 >> Moderate Haemophilus influenzae Flu PCR 3/15 >> FLU A positive  Blood 3/15 >> negative RVP 3/15 >> neg   Antimicrobials:  Cefepime 3/15 >> 3/20 Vancomycin 3/15 >> 3/17 Azithromycin 3/16 >> 3/20 Tamiflu 3/16 >> 3/20  Interim history/subjective:  Agitated this AM, no further events  Objective   Blood pressure (!) 88/52, pulse 96, temperature 98.2 F (36.8 C), temperature source Oral, resp. rate 20, height 5\' 8"  (1.727 m), weight 65.2 kg, SpO2 100 %.    Vent Mode: PRVC FiO2 (%):  [40 %] 40 % Set Rate:  [20 bmp] 20 bmp Vt Set:  [580  mL] 580 mL PEEP:  [5 cmH20] 5 cmH20 Plateau Pressure:  [18 cmH20-30 cmH20] 30 cmH20   Intake/Output Summary (Last 24 hours) at 05/01/2018 0941 Last data filed at 05/01/2018 1749 Gross per 24 hour  Intake 1265.43 ml  Output 4490 ml  Net -3224.57 ml   Filed Weights   04/28/18 0232 04/29/18 0446 05/01/18 0251  Weight: 64.4 kg 67.6 kg 65.2 kg   EXAM General: Agitated, acutely ill appearing HEENT: [place]/AT, PERRL, EOM-I and MMM Neuro: Sedate, moving all ext to command CV: RRR, Nl S1/S2 and -M/R/G PULM: Coarse BS diffusely GI: Soft, NT, ND and +BS Extremities: -edema and -tenderness Skin: no rashes or lesions  Resolved Hospital Problem list   Lactic acidosis Hypotension secondary to sedation  Assessment & Plan:   Acute hypercarbic and hypoxemic respiratory failure Acute Exacerbation of COPD  Influenza A Haemophilus influenza pneumonia P: Trach today at 1 PM Completed abx, steroids, D/Ced Continue pulmicort, duoneb, PRN albuterol D/C daily CXR at this point  ICU Agitation / Delirium P: Continue seroquel with prolonged QTc (RN has held) Post trach will d/c IV sedation Continue klonopin to 0.5 mg PT Q8   Acute kidney injury P: BMET in AM Replace electrolytes as indicated Avoid nephrotoxic agents, ensure adequate renal perfusion  History of coronary artery disease Hypertension P: Decrease Lopressor 12.5 mg BID  D/C Norvasc 10 mg QD ASA PT  Hypernatremia P: Continue free water 381ml Q6 Follow Na trend   Trach today.  Wean  is able.  Best practice:  Diet: TF Pain/Anxiety/Delirium protocol (if indicated): Fentanyl/Precedex VAP protocol (if indicated) DVT prophylaxis: Lovenox GI prophylaxis:  Glucose control: SSI Mobility: PT/OT Code Status: FC Family Communication: No family at bedside on am rounds 3/22 Continue ICU    Labs   CBC: Recent Labs  Lab 04/26/18 0336 04/27/18 0724 04/28/18 0226 04/29/18 0145 05/01/18 0251 05/01/18 0407  WBC 13.9* 10.1  10.5 11.4* 10.3  --   NEUTROABS  --   --  8.6* 9.9*  --   --   HGB 10.9* 10.7* 9.3* 8.6* 8.1* 6.5*  HCT 32.9* 33.6* 28.1* 26.7* 25.5* 19.0*  MCV 100.6* 102.1* 101.1* 106.4* 106.3*  --   PLT 116* 118* 114* 97* 136*  --     Basic Metabolic Panel: Recent Labs  Lab 04/25/18 0049 04/25/18 2146  04/26/18 0336 04/27/18 0724 04/28/18 0226 04/29/18 0145 04/30/18 0407 05/01/18 0251 05/01/18 0407  NA 144 144   < > 146* 150* 147* 146* 145 145 146*  K 3.6 3.7   < > 3.9 3.6 3.5 4.2 3.8 4.0 4.2  CL 115* 115*  --  114* 116* 116* 117* 115* 110  --   CO2 22 22  --  22 22 25 25 25 27   --   GLUCOSE 132* 152*  --  124* 118* 92 97 106* 138*  --   BUN 50* 43*  --  43* 46* 45* 45* 42* 42*  --   CREATININE 1.38* 1.13  --  1.18 0.96 0.89 0.79 0.79 1.13  --   CALCIUM 7.4* 7.4*  --  7.2* 7.5* 7.3* 7.0* 6.8* 7.2*  --   MG 1.9  --   --  2.1 2.1 1.8  --   --  1.4*  --   PHOS 2.1* 2.6  --  1.8* 1.7* 2.4*  --   --  3.7  --    < > = values in this interval not displayed.   GFR: Estimated Creatinine Clearance: 59.3 mL/min (by C-G formula based on SCr of 1.13 mg/dL). Recent Labs  Lab 04/25/18 0049  04/27/18 0724 04/28/18 0226 04/29/18 0145 05/01/18 0251  WBC 13.8*   < > 10.1 10.5 11.4* 10.3  LATICACIDVEN 2.2*  --   --   --   --   --    < > = values in this interval not displayed.    Liver Function Tests: Recent Labs  Lab 04/25/18 2146 04/30/18 0407  AST  --  20  ALT  --  23  ALKPHOS  --  66  BILITOT  --  0.6  PROT  --  3.8*  ALBUMIN 2.1* 1.3*   No results for input(s): LIPASE, AMYLASE in the last 168 hours. No results for input(s): AMMONIA in the last 168 hours.  ABG    Component Value Date/Time   PHART 7.435 05/01/2018 0407   PCO2ART 43.2 05/01/2018 0407   PO2ART 75.0 (L) 05/01/2018 0407   HCO3 28.9 (H) 05/01/2018 0407   TCO2 30 05/01/2018 0407   ACIDBASEDEF 1.4 04/25/2018 2320   O2SAT 95.0 05/01/2018 0407     Coagulation Profile: Recent Labs  Lab 05/01/18 0251  INR 1.4*     Cardiac Enzymes: No results for input(s): CKTOTAL, CKMB, CKMBINDEX, TROPONINI in the last 168 hours.  HbA1C: No results found for: HGBA1C  CBG: Recent Labs  Lab 04/30/18 1356 04/30/18 1718 04/30/18 1936 05/01/18 0023 05/01/18 0631  GLUCAP 93 88 103* 92 89   The patient  is critically ill with multiple organ systems failure and requires high complexity decision making for assessment and support, frequent evaluation and titration of therapies, application of advanced monitoring technologies and extensive interpretation of multiple databases.   Critical Care Time devoted to patient care services described in this note is  33  Minutes. This time reflects time of care of this signee Dr Jennet Maduro. This critical care time does not reflect procedure time, or teaching time or supervisory time of PA/NP/Med student/Med Resident etc but could involve care discussion time.  Rush Farmer, M.D. [facility]. Pager: [phone]. After hours pager: [phone].

## 2018-05-01 NOTE — Progress Notes (Signed)
eLink Physician-Brief Progress Note Patient Name: Kyle Lowery DOB: 23-Mar-1950 MRN: [id number]   Date of Service  05/01/2018  HPI/Events of Note  Severe Agitation - Patient trying to get out of bed on Precedex IV infusion at 1.2 mcg/kg/hour and Fentanyl IV infusion at 400 mcg/hour. Qtc interval = 0.46 seconds.   eICU Interventions  Will order: 1. Haldol 4 mg IV now.  2. Increase ceiling on Precedex IV infusion to 1.7 mcg/kg/hour.      Intervention Category Major Interventions: Delirium, psychosis, severe agitation - evaluation and management  Andrewjames Weirauch Eugene 05/01/2018, 2:44 AM

## 2018-05-02 ENCOUNTER — Inpatient Hospital Stay (HOSPITAL_COMMUNITY): Payer: BLUE CROSS/BLUE SHIELD

## 2018-05-02 DIAGNOSIS — Z930 Tracheostomy status: Secondary | ICD-10-CM

## 2018-05-02 DIAGNOSIS — G9340 Encephalopathy, unspecified: Secondary | ICD-10-CM

## 2018-05-02 LAB — CBC
HCT: 26.8 % — ABNORMAL LOW (ref 39.0–52.0)
Hemoglobin: 8.4 g/dL — ABNORMAL LOW (ref 13.0–17.0)
MCH: 31.6 pg (ref 26.0–34.0)
MCHC: 31.3 g/dL (ref 30.0–36.0)
MCV: 100.8 fL — ABNORMAL HIGH (ref 80.0–100.0)
Platelets: 123 10*3/uL — ABNORMAL LOW (ref 150–400)
RBC: 2.66 MIL/uL — ABNORMAL LOW (ref 4.22–5.81)
RDW: 18.4 % — ABNORMAL HIGH (ref 11.5–15.5)
WBC: 7.2 10*3/uL (ref 4.0–10.5)
nRBC: 0 % (ref 0.0–0.2)

## 2018-05-02 LAB — GLUCOSE, CAPILLARY
GLUCOSE-CAPILLARY: 100 mg/dL — AB (ref 70–99)
Glucose-Capillary: 100 mg/dL — ABNORMAL HIGH (ref 70–99)
Glucose-Capillary: 69 mg/dL — ABNORMAL LOW (ref 70–99)
Glucose-Capillary: 123 mg/dL — ABNORMAL HIGH (ref 70–99)
Glucose-Capillary: 99 mg/dL (ref 70–99)

## 2018-05-02 LAB — TYPE AND SCREEN
ABO/RH(D): O POS
Antibody Screen: NEGATIVE
Unit division: 0

## 2018-05-02 LAB — BPAM RBC
BLOOD PRODUCT EXPIRATION DATE: 202004172359
ISSUE DATE / TIME: 202003241546
UNIT TYPE AND RH: 5100

## 2018-05-02 LAB — BASIC METABOLIC PANEL
Anion gap: 4 — ABNORMAL LOW (ref 5–15)
BUN: 31 mg/dL — ABNORMAL HIGH (ref 8–23)
CO2: 25 mmol/L (ref 22–32)
Calcium: 6.7 mg/dL — ABNORMAL LOW (ref 8.9–10.3)
Chloride: 108 mmol/L (ref 98–111)
Creatinine, Ser: 1.1 mg/dL (ref 0.61–1.24)
GFR calc Af Amer: >60 mL/min (ref >60)
GFR calc non Af Amer: >60 mL/min (ref >60)
Glucose, Bld: 334 mg/dL — ABNORMAL HIGH (ref 70–99)
POTASSIUM: 3.6 mmol/L (ref 3.5–5.1)
Sodium: 137 mmol/L (ref 135–145)

## 2018-05-02 LAB — MAGNESIUM: Magnesium: 1.7 mg/dL (ref 1.7–2.4)

## 2018-05-02 LAB — PHOSPHORUS: Phosphorus: 2.6 mg/dL (ref 2.5–4.6)

## 2018-05-02 MED ORDER — ASPIRIN 300 MG RE SUPP
150.0000 mg | Freq: Every day | RECTAL | Status: DC
  Administered 2018-05-02: 150 mg via RECTAL
  Filled 2018-05-02: qty 1

## 2018-05-02 MED ORDER — DILTIAZEM HCL-DEXTROSE 100-5 MG/100ML-% IV SOLN (PREMIX)
5.0000 mg/h | INTRAVENOUS | Status: DC
  Administered 2018-05-02 (×2): 5 mg/h via INTRAVENOUS
  Administered 2018-05-03 (×2): 15 mg/h via INTRAVENOUS
  Administered 2018-05-04: 10 mg/h via INTRAVENOUS
  Filled 2018-05-02 (×7): qty 100

## 2018-05-02 MED ORDER — FENTANYL BOLUS VIA INFUSION
100.0000 ug | Freq: Once | INTRAVENOUS | Status: AC
  Administered 2018-05-02: 100 ug via INTRAVENOUS
  Filled 2018-05-02: qty 100

## 2018-05-02 MED ORDER — VITAL AF 1.2 CAL PO LIQD
1000.0000 mL | ORAL | Status: DC
  Administered 2018-05-02 – 2018-05-04 (×4): 1000 mL

## 2018-05-02 MED ORDER — FREE WATER
100.0000 mL | Freq: Three times a day (TID) | Status: DC
  Administered 2018-05-02 – 2018-05-03 (×2): 100 mL

## 2018-05-02 MED ORDER — ASPIRIN 81 MG PO CHEW
81.0000 mg | CHEWABLE_TABLET | Freq: Every day | ORAL | Status: DC
  Administered 2018-05-03 – 2018-05-04 (×2): 81 mg
  Filled 2018-05-02 (×2): qty 1

## 2018-05-02 MED ORDER — IPRATROPIUM-ALBUTEROL 0.5-2.5 (3) MG/3ML IN SOLN
3.0000 mL | Freq: Four times a day (QID) | RESPIRATORY_TRACT | Status: DC
  Administered 2018-05-03 – 2018-05-04 (×4): 3 mL via RESPIRATORY_TRACT
  Filled 2018-05-02 (×5): qty 3

## 2018-05-02 MED ORDER — VECURONIUM BROMIDE 10 MG IV SOLR
INTRAVENOUS | Status: AC
  Filled 2018-05-02: qty 10

## 2018-05-02 MED ORDER — METOPROLOL TARTRATE 5 MG/5ML IV SOLN
INTRAVENOUS | Status: AC
  Administered 2018-05-02: 2.5 mg via INTRAVENOUS
  Filled 2018-05-02: qty 5

## 2018-05-02 MED ORDER — METOPROLOL TARTRATE 5 MG/5ML IV SOLN
2.5000 mg | Freq: Four times a day (QID) | INTRAVENOUS | Status: DC | PRN
  Administered 2018-05-02 – 2018-05-03 (×4): 2.5 mg via INTRAVENOUS
  Filled 2018-05-02 (×3): qty 5

## 2018-05-02 MED ORDER — VECURONIUM BROMIDE 10 MG IV SOLR
10.0000 mg | Freq: Once | INTRAVENOUS | Status: AC
  Administered 2018-05-02: 10 mg via INTRAVENOUS

## 2018-05-02 NOTE — Progress Notes (Signed)
Patient had two hypoglycemic episodes during shift. Nurse contacted Dr. Lucile Shutters for orders. Orders received.

## 2018-05-02 NOTE — Progress Notes (Signed)
eLink Physician-Brief Progress Note Patient Name: Kyle Lowery DOB: 1950-11-30 MRN: [id number]   Date of Service  05/02/2018  HPI/Events of Note  Sinus tachycardia associated with elevated blood pressure, patient denies pain  eICU Interventions  Lopressor 2.5 mg iv q 6 hours prn        Frederik Pear 05/02/2018, 6:31 AM

## 2018-05-02 NOTE — Progress Notes (Signed)
Patient placed on trach collar at 40%. Patient tolerating well at this time. RT will continue to monitor.

## 2018-05-02 NOTE — Progress Notes (Signed)
Nutrition Follow-up  RD working remotely.  DOCUMENTATION CODES:   Non-severe (moderate) malnutrition in context of chronic illness  INTERVENTION:   Start TF via NG tube: - Vital AF 1.2 @ 60 ml/hr (1440 ml/day) - Free water per MD  Tube feeding regimen provides 1728 kcal, 108 grams of protein, and 1168 ml of H2O (100% of needs).  NUTRITION DIAGNOSIS:   Moderate Malnutrition related to chronic illness (COPD) as evidenced by mild fat depletion, moderate fat depletion, mild muscle depletion, moderate muscle depletion.  Ongoing, being addressed via TF  GOAL:   Patient will meet greater than or equal to 90% of their needs  Met via TF  MONITOR:   Vent status, Weight trends, TF tolerance, Labs  REASON FOR ASSESSMENT:   Ventilator, Consult Enteral/tube feeding initiation and management  ASSESSMENT:   68 year old male who presented to the ED on 3/15 with flu-like symptoms. PMH of CAD, COPD, depression, GERD, HTN, HLD. Pt intubated in the ED. Admitted with PNA, flu A positive.  3/17 - extubated 3/18 - reintubated 3/24 - trach  Pt had two hypoglycemia episodes during night shift due to being off tube feeds. MD placed NG tube this morning at bedside. Per x-ray, NG tube tip and side port are in stomach. MD approved RD restarting TF today. Discussed with RN.  Per MD note, will attempt TC as able.  Patient is currently on ventilator support via trach. MV: 11.7 L/min Temp (24hrs), Avg:98.7 F (37.1 C), Min:96.7 F (35.9 C), Max:100.9 F (38.3 C) BP: 148/80 MAP: 96  Propofol: off this AM Fentanyl: 5 ml/hr D10: 50 ml/hr  Medications reviewed and include: folic acid, free water 300 ml q 6 hours, SSI q 4 hours, MVI with minerals, Protonix, thiamine  Labs reviewed: hemoglobin 8.4 (L), calcium 6.7 (L), triglycerides 373 (H) CBG's: 123, 69, 62, 66, 103, 117 x 24 hours K+, mag, phos all WNL.  UOP: 1930 ml x 24 hours I/O's: +6.0 L since admit  Diet Order:   Diet Order             Diet NPO time specified  Diet effective midnight              EDUCATION NEEDS:   Not appropriate for education at this time  Skin:  Skin Assessment: Reviewed RN Assessment  Last BM:  05/02/18 smear type 5  Height:   Ht Readings from Last 1 Encounters:  04/22/18 '5\' 8"'$  (1.727 m)    Weight:   Wt Readings from Last 1 Encounters:  05/02/18 66.1 kg    Ideal Body Weight:  70 kg  BMI:  Body mass index is 22.16 kg/m.  Estimated Nutritional Needs:   Kcal:  1718  Protein:  95-110 grams  Fluid:  1.6-1.8 L    Gaynell Face, MS, RD, LDN Inpatient Clinical Dietitian Pager: [phone] Weekend/After Hours: [phone]

## 2018-05-02 NOTE — Progress Notes (Signed)
Patient's heart rate increased to 130s. Had runs of PVCs with a rate of 150-170 bpm. EKG obtained. Patient denies pain. Nurse contacted Dr. Lucile Shutters. Orders received.

## 2018-05-02 NOTE — Progress Notes (Signed)
Patient attempting to get out of bed. Patient pulls tubing off of tracheostomy and attempts to pull out inner cannula. Nurse asked patient if he is in pain. Patient did not respond. Versed given, but not effective. Dr. Lucile Shutters contacted for orders.

## 2018-05-02 NOTE — TOC Initial Note (Signed)
Transition of Care [facility]) - Initial/Assessment Note    Patient Details  Name: Kyle Lowery MRN: [id number] Date of Birth: [birth year]  Transition of [facility]) CM/SW Contact:    Midge Minium RN, BSN, NCM-BC, ACM-RN [phone] Phone Number: 05/02/2018, 1:54 PM  Clinical Narrative:                 CM consult for [facility] acknowledged. Patient remains critically ill, s/p trach 3/24 on vent support. Patient will benefit from an [facility] referral. CM team will continue to follow.      Admission diagnosis:  Endotracheal tube present [Z97.8] Acute respiratory failure ([place]) [J96.00] Patient Active Problem List   Diagnosis Date Noted  . Malnutrition of moderate degree 04/24/2018  . Endotracheal tube present   . Ventilator dependent ([place])   . Influenzal pneumonia   . Acute exacerbation of chronic obstructive pulmonary disease (COPD) ([place])   . Pneumonia 04/22/2018  . Acute respiratory failure ([place]) 04/22/2018  . Multiple lung nodules on CT 02/12/2016  . Dysphagia 02/12/2016  . Depression 02/12/2016  . GERD (gastroesophageal reflux disease) 02/12/2016  . Pulmonary emphysema ([place]) 02/12/2016  . COPD, moderate ([place]) 04/22/2013  . Headache(784.0) 03/11/2011  . Vasovagal syncope 03/11/2011  . Tobacco use disorder 02/18/2010  . CORONARY ATHEROSCLEROSIS NATIVE CORONARY ARTERY 02/18/2010  . HYPERLIPIDEMIA 02/17/2010  . Essential hypertension, benign 02/17/2010   PCP:  Rosita Fire, MD Pharmacy:   [facility] [address] [place] [address] Phone: [phone] Fax: [phone]  [place], [place] - [place] [address] [place] [address] Phone: [phone] Fax: [phone]

## 2018-05-02 NOTE — Progress Notes (Signed)
SLP Cancellation Note  Patient Details Name: ETTORE TREBILCOCK MRN: [id number] DOB: Oct 28, 1950   Cancelled treatment:       Reason Eval/Treat Not Completed: Other (comment) Chart reviewed and discussed pt with RN. She says that pt may likely try TC today, but that it would not be until later as he received a paralytic medication around 10:00. Will hold PMV this morning and attempt as able.   Venita Sheffield Bethenny Losee 05/02/2018, 11:28 AM  Pollyann Glen, M.A. [place] Acute Environmental education officer [phone] Office [phone]

## 2018-05-02 NOTE — Progress Notes (Signed)
NAME:  Kyle Lowery, MRN:  [id number], DOB:  Nov 22, 1950, LOS: 69 ADMISSION DATE:  04/22/2018, CONSULTATION DATE:  04/22/2018 REFERRING MD:  Dr. Roderic Palau, CHIEF COMPLAINT:  Respiratory Distress    History of present illness   68 year old male with reported 2 weeks of cough, congestion, febrile, recently went to PCP, given Z-Pak. Admitted to [facility] 3/10-3/12 with flu-like symptoms, given Tamiflu. Arrives to [facility] ED on 3/15 with A.Fib RVR, hypokalemia, hypoxia, and dyspnea. CXR with bilateral infiltrates requiring intubation. Transferred to [facility] for further evaluation.   Per wife patient went home on 3/12 however was still not feeling normal (she thinks he left against medical advice). For the past few days patient has been weak and short of breath with very poor appetite. Extubated on 3/17 and had to be reintubated on 3/18  Past Medical History  CAD, COPD, Depression   Significant Hospital Events   3/15  Presents to [facility] ED > Transferred to [facility]  3/17  Extubated 3/18  Re-intubated for increased WOB 3/22  Sedation needs / waxing agitated delirium  Consults:  PCCM   Procedures:  ETT 3/15 > 3/17  Significant Diagnostic Tests:  CXR 3/15 > Portion of lateral right base not visualized. Infiltrate noted in visualized right base. Endotracheal tube as described without pneumothorax. Stable cardiac silhouette.  Micro Data:  Tracheal Asp 3/15 >> Moderate Haemophilus influenzae Flu PCR 3/15 >> FLU A positive  Blood 3/15 >> negative RVP 3/15 >> neg   Antimicrobials:  Cefepime 3/15 >> 3/20 Vancomycin 3/15 >> 3/17 Azithromycin 3/16 >> 3/20 Tamiflu 3/16 >> 3/20  Interim history/subjective:  Unable to get an NGT in overnight, agitated on propofol  Objective   Blood pressure (!) 183/68, pulse (!) 125, temperature (!) 100.9 F (38.3 C), temperature source Oral, resp. rate 20, height 5\' 8"  (1.727 m), weight 66.1 kg, SpO2 100 %.    Vent Mode: PRVC FiO2 (%):  [40 %] 40 % Set Rate:   [20 bmp] 20 bmp Vt Set:  [580 mL] 580 mL PEEP:  [5 cmH20] 5 cmH20 Plateau Pressure:  [17 CZY60-63 cmH20] 17 cmH20   Intake/Output Summary (Last 24 hours) at 05/02/2018 1021 Last data filed at 05/02/2018 0900 Gross per 24 hour  Intake 1438.09 ml  Output 1555 ml  Net -116.91 ml   Filed Weights   04/29/18 0446 05/01/18 0251 05/02/18 0443  Weight: 67.6 kg 65.2 kg 66.1 kg   EXAM General: Agitated, chronically ill appearing male HEENT: [place]/AT, PERRL, EOM-I and MMM Neuro: Agitated, moving all ext spontaneously but not to command CV: RRR, Nl S1/S2 and -M/R/G PULM: Coarse BS diffusely GI: Soft, NT, ND and +BS Extremities: -edema and -tenderness Skin: no rashes or lesions  Resolved Hospital Problem list   Lactic acidosis Hypotension secondary to sedation  Assessment & Plan:   Acute hypercarbic and hypoxemic respiratory failure Acute Exacerbation of COPD  Influenza A Haemophilus influenza pneumonia P: PS and attempt TC as able Completed abx, steroids, D/Ced Continue pulmicort, duoneb, PRN albuterol Titrate O2 for sat of 88-92%  ICU Agitation / Delirium P: Restart seroquel since I placed and NGT Continue klonopin to 0.5 mg PT Q8  D/C propofol Reattempt precedex  Acute kidney injury P: BMET in AM Replace electrolytes as indicated Avoid nephrotoxic agents, ensure adequate renal perfusion  History of coronary artery disease Hypertension P: Lopressor 12.5 mg BID  D/C Norvasc 10 mg QD ASA PT  Hypernatremia P: Continue free water 347ml Q6 Follow Na  trend   Place NGT Attempt weaning Restart precedex  Best practice:  Diet: TF Pain/Anxiety/Delirium protocol (if indicated): Fentanyl/Precedex VAP protocol (if indicated) DVT prophylaxis: Lovenox GI prophylaxis:  Glucose control: SSI Mobility: PT/OT Code Status: FC Family Communication: No family at bedside on am rounds 3/22 Continue ICU    Labs   CBC: Recent Labs  Lab 04/27/18 0724 04/28/18 0226 04/29/18  0145 05/01/18 0251 05/01/18 0407 05/01/18 1041 05/01/18 1921 05/02/18 0435  WBC 10.1 10.5 11.4* 10.3  --   --   --  7.2  NEUTROABS  --  8.6* 9.9*  --   --   --   --   --   HGB 10.7* 9.3* 8.6* 8.1* 6.5* 6.8* 10.3* 8.4*  HCT 33.6* 28.1* 26.7* 25.5* 19.0* 20.0* 33.2* 26.8*  MCV 102.1* 101.1* 106.4* 106.3*  --   --   --  100.8*  PLT 118* 114* 97* 136*  --   --   --  123*    Basic Metabolic Panel: Recent Labs  Lab 04/26/18 0336 04/27/18 0724 04/28/18 0226 04/29/18 0145 04/30/18 0407 05/01/18 0251 05/01/18 0407 05/01/18 1041 05/02/18 0435  NA 146* 150* 147* 146* 145 145 146* 147* 137  K 3.9 3.6 3.5 4.2 3.8 4.0 4.2 4.2 3.6  CL 114* 116* 116* 117* 115* 110  --   --  108  CO2 22 22 25 25 25 27   --   --  25  GLUCOSE 124* 118* 92 97 106* 138*  --  115* 334*  BUN 43* 46* 45* 45* 42* 42*  --   --  31*  CREATININE 1.18 0.96 0.89 0.79 0.79 1.13  --   --  1.10  CALCIUM 7.2* 7.5* 7.3* 7.0* 6.8* 7.2*  --   --  6.7*  MG 2.1 2.1 1.8  --   --  1.4*  --   --  1.7  PHOS 1.8* 1.7* 2.4*  --   --  3.7  --   --  2.6   GFR: Estimated Creatinine Clearance: 61.8 mL/min (by C-G formula based on SCr of 1.1 mg/dL). Recent Labs  Lab 04/28/18 0226 04/29/18 0145 05/01/18 0251 05/02/18 0435  WBC 10.5 11.4* 10.3 7.2    Liver Function Tests: Recent Labs  Lab 04/25/18 2146 04/30/18 0407  AST  --  20  ALT  --  23  ALKPHOS  --  66  BILITOT  --  0.6  PROT  --  3.8*  ALBUMIN 2.1* 1.3*   No results for input(s): LIPASE, AMYLASE in the last 168 hours. No results for input(s): AMMONIA in the last 168 hours.  ABG    Component Value Date/Time   PHART 7.435 05/01/2018 0407   PCO2ART 43.2 05/01/2018 0407   PO2ART 75.0 (L) 05/01/2018 0407   HCO3 28.9 (H) 05/01/2018 0407   TCO2 30 05/01/2018 0407   ACIDBASEDEF 1.4 04/25/2018 2320   O2SAT 95.0 05/01/2018 0407     Coagulation Profile: Recent Labs  Lab 05/01/18 0251  INR 1.4*    Cardiac Enzymes: No results for input(s): CKTOTAL, CKMB,  CKMBINDEX, TROPONINI in the last 168 hours.  HbA1C: No results found for: HGBA1C  CBG: Recent Labs  Lab 05/01/18 1614 05/01/18 1941 05/01/18 2311 05/02/18 0340 05/02/18 0750  GLUCAP 103* 66* 62* 69* 123*   The patient is critically ill with multiple organ systems failure and requires high complexity decision making for assessment and support, frequent evaluation and titration of therapies, application of advanced monitoring technologies and  extensive interpretation of multiple databases.   Critical Care Time devoted to patient care services described in this note is  32  Minutes. This time reflects time of care of this signee Dr Jennet Maduro. This critical care time does not reflect procedure time, or teaching time or supervisory time of PA/NP/Med student/Med Resident etc but could involve care discussion time.  Rush Farmer, M.D. [facility] Pulmonary/Critical Care Medicine. Pager: [phone]. After hours pager: [phone].

## 2018-05-02 NOTE — Progress Notes (Signed)
PT Cancellation Note  Patient Details Name: Kyle Lowery MRN: [id number] DOB: 02-19-1950   Cancelled Treatment:    Reason Eval/Treat Not Completed: Patient not medically ready. Per chart review, pt received paralytic around [place], PT, DPT Acute Rehabilitation Services Pager [phone] Office [phone]    Willy Eddy 05/02/2018, 12:54 PM

## 2018-05-03 LAB — BASIC METABOLIC PANEL
Anion gap: 9 (ref 5–15)
BUN: 21 mg/dL (ref 8–23)
CO2: 26 mmol/L (ref 22–32)
Calcium: 7.4 mg/dL — ABNORMAL LOW (ref 8.9–10.3)
Chloride: 109 mmol/L (ref 98–111)
Creatinine, Ser: 0.97 mg/dL (ref 0.61–1.24)
GFR calc Af Amer: >60 mL/min (ref >60)
GFR calc non Af Amer: >60 mL/min (ref >60)
GLUCOSE: 143 mg/dL — AB (ref 70–99)
Potassium: 3.2 mmol/L — ABNORMAL LOW (ref 3.5–5.1)
Sodium: 144 mmol/L (ref 135–145)

## 2018-05-03 LAB — GLUCOSE, CAPILLARY
GLUCOSE-CAPILLARY: 114 mg/dL — AB (ref 70–99)
Glucose-Capillary: 115 mg/dL — ABNORMAL HIGH (ref 70–99)
Glucose-Capillary: 123 mg/dL — ABNORMAL HIGH (ref 70–99)
Glucose-Capillary: 142 mg/dL — ABNORMAL HIGH (ref 70–99)
Glucose-Capillary: 146 mg/dL — ABNORMAL HIGH (ref 70–99)
Glucose-Capillary: 87 mg/dL (ref 70–99)

## 2018-05-03 LAB — CBC
HEMATOCRIT: 31.4 % — AB (ref 39.0–52.0)
Hemoglobin: 9.7 g/dL — ABNORMAL LOW (ref 13.0–17.0)
MCH: 31.1 pg (ref 26.0–34.0)
MCHC: 30.9 g/dL (ref 30.0–36.0)
MCV: 100.6 fL — AB (ref 80.0–100.0)
Platelets: 160 10*3/uL (ref 150–400)
RBC: 3.12 MIL/uL — ABNORMAL LOW (ref 4.22–5.81)
RDW: 17.1 % — ABNORMAL HIGH (ref 11.5–15.5)
WBC: 13.5 10*3/uL — ABNORMAL HIGH (ref 4.0–10.5)
nRBC: 0 % (ref 0.0–0.2)

## 2018-05-03 LAB — MAGNESIUM
Magnesium: 1.5 mg/dL — ABNORMAL LOW (ref 1.7–2.4)
Magnesium: 2.4 mg/dL (ref 1.7–2.4)

## 2018-05-03 LAB — PHOSPHORUS: Phosphorus: 2.6 mg/dL (ref 2.5–4.6)

## 2018-05-03 LAB — PROCALCITONIN: Procalcitonin: 0.7 ng/mL

## 2018-05-03 MED ORDER — POTASSIUM CHLORIDE 20 MEQ/15ML (10%) PO SOLN
30.0000 meq | ORAL | Status: AC
  Administered 2018-05-03 (×2): 30 meq
  Filled 2018-05-03 (×2): qty 30

## 2018-05-03 MED ORDER — FREE WATER
300.0000 mL | Freq: Three times a day (TID) | Status: DC
  Administered 2018-05-03 – 2018-05-06 (×9): 300 mL

## 2018-05-03 MED ORDER — PANTOPRAZOLE SODIUM 40 MG PO PACK
40.0000 mg | PACK | Freq: Every day | ORAL | Status: DC
  Administered 2018-05-03 – 2018-05-09 (×7): 40 mg
  Filled 2018-05-03 (×8): qty 20

## 2018-05-03 MED ORDER — METOPROLOL TARTRATE 12.5 MG HALF TABLET: 12.5000 mg | ORAL_TABLET | Freq: Two times a day (BID) | ORAL | Status: DC

## 2018-05-03 MED ORDER — METOPROLOL TARTRATE 12.5 MG HALF TABLET
12.5000 mg | ORAL_TABLET | Freq: Two times a day (BID) | ORAL | Status: DC
  Administered 2018-05-03 – 2018-05-04 (×2): 12.5 mg via ORAL
  Filled 2018-05-03 (×3): qty 1

## 2018-05-03 MED ORDER — SODIUM CHLORIDE 0.9 % IV SOLN
6.0000 g | Freq: Once | INTRAVENOUS | Status: AC
  Administered 2018-05-03: 6 g via INTRAVENOUS
  Filled 2018-05-03: qty 2

## 2018-05-03 MED ORDER — FENTANYL CITRATE (PF) 100 MCG/2ML IJ SOLN
50.0000 ug | INTRAMUSCULAR | Status: DC | PRN
  Administered 2018-05-03 – 2018-05-04 (×6): 50 ug via INTRAVENOUS
  Filled 2018-05-03 (×7): qty 2

## 2018-05-03 NOTE — Progress Notes (Signed)
[facility] ADULT ICU REPLACEMENT PROTOCOL FOR AM LAB REPLACEMENT ONLY  The patient does apply for the [facility] Adult ICU Electrolyte Replacment Protocol based on the criteria listed below:   1. Is GFR >/= 40 ml/min? Yes.    Patient's GFR today is >60 2. Is urine output >/= 0.5 ml/kg/hr for the last 6 hours? Yes.   Patient's UOP is 2.7 ml/kg/hr 3. Is BUN < 60 mg/dL? Yes.    Patient's BUN today is 21 4. Abnormal electrolyte(s):K3.2,Mg1.5 5. Ordered repletion with: per protocol 6. If a panic level lab has been reported, has the CCM MD in charge been notified? Yes.  .   Physician:  Debbe Mounts 05/03/2018 5:24 AM

## 2018-05-03 NOTE — Evaluation (Addendum)
Physical Therapy Evaluation Patient Details Name: Kyle Lowery MRN: [id number] DOB: 07/06/1950 Today's Date: 05/03/2018   History of Present Illness  Pt transferred from [facility] with Acute hypercarbic and hypoxemic respiratory failure and copd exacerbation due to flu+ and PNA. Pt intubated 3/15 -3/18 and reintubated 3/19. Pt trached on 3/24.   Clinical Impression  Pt admitted with above diagnosis and presents to PT with functional limitations due to deficits listed below (See PT problem list). Pt needs skilled PT to maximize independence and safety to allow discharge to [facility].      Follow Up Recommendations LTACH    Equipment Recommendations  Other (comment)(To be determined)    Recommendations for Other Services OT consult     Precautions / Restrictions Precautions Precautions: Fall;Other (comment) Precaution Comments: trach, vent Restrictions Weight Bearing Restrictions: No      Mobility  Bed Mobility Overal bed mobility: Needs Assistance Bed Mobility: Supine to Sit;Sit to Supine     Supine to sit: +2 for physical assistance;Max assist Sit to supine: +2 for physical assistance;Total assist   General bed mobility comments: Assist to bring legs off of bed, elevate trunk into sitting, and bring hips to EOB  Transfers                 General transfer comment: Not attempted. Will need mechanical lift  Ambulation/Gait                Stairs            Wheelchair Mobility    Modified Rankin (Stroke Patients Only)       Balance Overall balance assessment: Needs assistance Sitting-balance support: Bilateral upper extremity supported;Feet supported Sitting balance-Leahy Scale: Zero Sitting balance - Comments: Sat EOB x 5 minutes with mod to max assist. Postural control: Other (comment)(Anterior lean)                                   Pertinent Vitals/Pain Pain Assessment: Faces Faces Pain Scale: No hurt    Home Living  Family/patient expects to be discharged to:: Private residence Living Arrangements: Spouse/significant other               Additional Comments: Pt unable to state    Prior Function Level of Independence: Independent         Comments: Not confirmed but assumed     Hand Dominance        Extremity/Trunk Assessment   Upper Extremity Assessment Upper Extremity Assessment: Defer to OT evaluation    Lower Extremity Assessment Lower Extremity Assessment: Generalized weakness       Communication   Communication: Tracheostomy  Cognition Arousal/Alertness: Awake/alert Behavior During Therapy: Restless Overall Cognitive Status: Difficult to assess                                 General Comments: Pt followed 1 step commands with incr time and inconsistently      General Comments General comments (skin integrity, edema, etc.): Pt on trach collar. SpO2 >92%    Exercises     Assessment/Plan    PT Assessment Patient needs continued PT services  PT Problem List Decreased strength;Decreased activity tolerance;Decreased balance;Decreased mobility;Decreased cognition;Decreased knowledge of use of DME;Decreased knowledge of precautions;Cardiopulmonary status limiting activity       PT Treatment Interventions DME instruction;Functional mobility training;Therapeutic activities;Therapeutic exercise;Balance training;Cognitive  remediation;Patient/family education    PT Goals (Current goals can be found in the Care Plan section)  Acute Rehab PT Goals Patient Stated Goal: unable to state PT Goal Formulation: Patient unable to participate in goal setting Time For Goal Achievement: 05/17/18 Potential to Achieve Goals: Fair    Frequency Min 2X/week   Barriers to discharge        Co-evaluation               AM-PAC PT "6 Clicks" Mobility  Outcome Measure Help needed turning from your back to your side while in a flat bed without using bedrails?:  Total Help needed moving from lying on your back to sitting on the side of a flat bed without using bedrails?: Total Help needed moving to and from a bed to a chair (including a wheelchair)?: Total Help needed standing up from a chair using your arms (e.g., wheelchair or bedside chair)?: Total Help needed to walk in hospital room?: Total Help needed climbing 3-5 steps with a railing? : Total 6 Click Score: 6    End of Session Equipment Utilized During Treatment: Oxygen(trach collar) Activity Tolerance: Patient limited by fatigue Patient left: in bed;with call bell/phone within reach;with bed alarm set Nurse Communication: Mobility status;Need for lift equipment PT Visit Diagnosis: Other abnormalities of gait and mobility (R26.89);Muscle weakness (generalized) (M62.81)    Time: 1211-1225 PT Time Calculation (min) (ACUTE ONLY): 14 min   Charges:   PT Evaluation $PT Eval Moderate Complexity: [place] Pager [phone] Office [place] 05/03/2018, 3:02 PM

## 2018-05-03 NOTE — Progress Notes (Signed)
85 ml of Fentanyl wasted in [employer] container after order d/c'd per NP order. Waste witnessed by Junie Panning, NP

## 2018-05-03 NOTE — Progress Notes (Addendum)
SLP Cancellation Note  Patient Details Name: Kyle Lowery MRN: [id number] DOB: 08-16-1950   Cancelled treatment:       Reason Eval/Treat Not Completed: Patient not medically ready. Pt's case was discussed with his nurse and she indicated that the pt is currently producing a copious amount of secretions which require hourly suctioning. SLP will follow up.   Roniqua Kintz I. Hardin Negus, [place], [place] Office number [phone] Pager [place] 05/03/2018, 12:53 PM

## 2018-05-03 NOTE — Progress Notes (Addendum)
NAME:  Kyle Lowery, MRN:  [id number], DOB:  April 30, 1950, LOS: 60 ADMISSION DATE:  04/22/2018, CONSULTATION DATE:  04/22/2018 REFERRING MD:  Dr. Roderic Palau, CHIEF COMPLAINT:  Respiratory Distress    History of present illness   68 year old male with reported 2 weeks of cough, congestion, febrile, recently went to PCP, given Z-Pak. Admitted to [facility] 3/10-3/12 with flu-like symptoms, given Tamiflu. Arrives to [facility] ED on 3/15 with A.Fib RVR, hypokalemia, hypoxia, and dyspnea. CXR with bilateral infiltrates requiring intubation. Transferred to [facility] for further evaluation.   Per wife patient went home on 3/12 however was still not feeling normal (she thinks he left against medical advice). For the past few days patient has been weak and short of breath with very poor appetite. Extubated on 3/17 and had to be reintubated on 3/18  Past Medical History  CAD, COPD, Depression   Significant Hospital Events   3/15  Presents to [facility] ED > Transferred to [facility]  3/17  Extubated 3/18  Re-intubated for increased WOB 3/22  Sedation needs / waxing agitated delirium 3/25   Weaning on ATC, episodes of AfibRVR, ongoing agitation, NGT placed   Consults:  PCCM   Procedures:  ETT 3/15 > 3/17  Significant Diagnostic Tests:  3/16 TTE >> EF 55-60%, no wall motion abnormalities, normal RV, mild tricuspid thickening  Micro Data:  Tracheal Asp 3/15 >> Moderate Haemophilus influenzae Flu PCR 3/15 >> FLU A positive  Blood 3/15 >> negative RVP 3/15 >> neg  3/26 Trach asp >>  Antimicrobials:  Cefepime 3/15 >> 3/20 Vancomycin 3/15 >> 3/17 Azithromycin 3/16 >> 3/20 Tamiflu 3/16 >> 3/20  Interim history/subjective:  Tmax 102.2 overnight On ATC 40% yesterday, rested on MV overnight, and now back on ATC- ongoing thick/ tan secretions per RN requiring frequent suctioning Hypotensive episode this am- with cardizem, precedex, fentanyl, metoprolol and versed RN reports agitation improved today and following  commands   Objective   Blood pressure 138/69, pulse (!) 131, temperature 97.7 F (36.5 C), temperature source Oral, resp. rate 12, height 5\' 8"  (1.727 m), weight 65.7 kg, SpO2 100 %. CVP:  [10 mmHg] 10 mmHg  Vent Mode: PRVC FiO2 (%):  [28 %-40 %] 40 % Set Rate:  [20 bmp] 20 bmp Vt Set:  [580 mL] 580 mL PEEP:  [5 cmH20] 5 cmH20 Plateau Pressure:  [17 cmH20-20 cmH20] 17 cmH20   Intake/Output Summary (Last 24 hours) at 05/03/2018 0946 Last data filed at 05/03/2018 0700 Gross per 24 hour  Intake 1695.12 ml  Output 4050 ml  Net -2354.88 ml   Filed Weights   05/01/18 0251 05/02/18 0443 05/03/18 0429  Weight: 65.2 kg 66.1 kg 65.7 kg   EXAM General:  Older appearing male sitting up in bed in NAD HEENT: MM pink/moist, pupils 4/reactive, left nare NGT, midline cuffed trach- with tan secretions noted around site Neuro: Awake, ?HOH, f/c, MAE CV: rr ir- SR with freq PAC, burst of Afib, no murmur, +2 pulses PULM: even/non-labored, lungs bilaterally coarse, slight right basilar rales GI: soft, non-tender, bs active - last BM 3/25 Extremities: warm/dry, no edema  Skin: no rashes  Resolved Hospital Problem list   Lactic acidosis Hypotension secondary to sedation  Assessment & Plan:   Acute hypercarbic and hypoxemic respiratory failure s/p trach 3/24 Acute Exacerbation of COPD  Influenza A Haemophilus influenza pneumonia - completed abx 3/20 P: PS and attempt TC as able- clinically progressing well, tolerating ATC during day, rest MV at night  Continue pulmicort, duoneb q 6, PRN albuterol Titrate O2 for sat of 88-92% SLP following Send trach asp, monitor clinically for now/ hold on abx / trend WBC/ fever curve Consider hypertonic saline if ongoing thick secretions Ongoing trach care- will need sutures out ~ 4/3  ICU Agitation / Delirium P:  seroquel 50mg  daily and klonopin to 0.5 mg PT Q8  Continue precedex D/c fentanyl gtt, prn only  Monitor QTc  Continue thiamine, folate, MVI   Acute kidney injury- resolved  Hypokalemia, Hypomag P: S/p KCL and Mag replete this am  Trend renal panel, UOP, trend wts/ I/Os Replace electrolytes as indicated Avoid nephrotoxic agents, ensure adequate renal perfusion  History of coronary artery disease Hypertension P: Lopressor 12.5 mg BID  ASA 81 mg daily  Continue pravastatin per tube   Hypernatremia - free water deficit 900 ml P: Free water 300mg  q 8hrs Follow Na trend   PAF- new - CHA2DS2-VASc score 1, no need for chronic AC P:  Wean cardizem gtt Increase metoprolol from 12.5 mg qd to BID  Leukocytosis - one account of fever P:  If ongoing, consider pan-culture Monitor clinically/ trend WBC/ fever curve Send trach asp given thick/ tan secretions  GLOBAL: Case management following for LTAC placement  Best practice:  Diet: TF Pain/Anxiety/Delirium protocol (if indicated): Fentanyl/Precedex VAP protocol (if indicated) DVT prophylaxis: Lovenox GI prophylaxis: protonix changed to per tube Glucose control: SSI Mobility: PT/OT Code Status: FC Family Communication: called and left message on wife's cell phone, no answer for daily communication and update.  Continue ICU    Labs   CBC: Recent Labs  Lab 04/28/18 0226 04/29/18 0145 05/01/18 0251 05/01/18 0407 05/01/18 1041 05/01/18 1921 05/02/18 0435 05/03/18 0419  WBC 10.5 11.4* 10.3  --   --   --  7.2 13.5*  NEUTROABS 8.6* 9.9*  --   --   --   --   --   --   HGB 9.3* 8.6* 8.1* 6.5* 6.8* 10.3* 8.4* 9.7*  HCT 28.1* 26.7* 25.5* 19.0* 20.0* 33.2* 26.8* 31.4*  MCV 101.1* 106.4* 106.3*  --   --   --  100.8* 100.6*  PLT 114* 97* 136*  --   --   --  123* 960    Basic Metabolic Panel: Recent Labs  Lab 04/27/18 0724 04/28/18 0226 04/29/18 0145 04/30/18 0407 05/01/18 0251 05/01/18 0407 05/01/18 1041 05/02/18 0435 05/03/18 0419  NA 150* 147* 146* 145 145 146* 147* 137 144  K 3.6 3.5 4.2 3.8 4.0 4.2 4.2 3.6 3.2*  CL 116* 116* 117* 115* 110  --   --   108 109  CO2 22 25 25 25 27   --   --  25 26  GLUCOSE 118* 92 97 106* 138*  --  115* 334* 143*  BUN 46* 45* 45* 42* 42*  --   --  31* 21  CREATININE 0.96 0.89 0.79 0.79 1.13  --   --  1.10 0.97  CALCIUM 7.5* 7.3* 7.0* 6.8* 7.2*  --   --  6.7* 7.4*  MG 2.1 1.8  --   --  1.4*  --   --  1.7 1.5*  PHOS 1.7* 2.4*  --   --  3.7  --   --  2.6 2.6   GFR: Estimated Creatinine Clearance: 69.6 mL/min (by C-G formula based on SCr of 0.97 mg/dL). Recent Labs  Lab 04/29/18 0145 05/01/18 0251 05/02/18 0435 05/03/18 0419  WBC 11.4* 10.3 7.2 13.5*  Liver Function Tests: Recent Labs  Lab 04/30/18 0407  AST 20  ALT 23  ALKPHOS 66  BILITOT 0.6  PROT 3.8*  ALBUMIN 1.3*   No results for input(s): LIPASE, AMYLASE in the last 168 hours. No results for input(s): AMMONIA in the last 168 hours.  ABG    Component Value Date/Time   PHART 7.435 05/01/2018 0407   PCO2ART 43.2 05/01/2018 0407   PO2ART 75.0 (L) 05/01/2018 0407   HCO3 28.9 (H) 05/01/2018 0407   TCO2 30 05/01/2018 0407   ACIDBASEDEF 1.4 04/25/2018 2320   O2SAT 95.0 05/01/2018 0407     Coagulation Profile: Recent Labs  Lab 05/01/18 0251  INR 1.4*    Cardiac Enzymes: No results for input(s): CKTOTAL, CKMB, CKMBINDEX, TROPONINI in the last 168 hours.  HbA1C: No results found for: HGBA1C  CBG: Recent Labs  Lab 05/02/18 0750 05/02/18 1135 05/02/18 1525 05/03/18 0027 05/03/18 0351  GLUCAP 123* 99 100* 146* 87   Kennieth Rad, MSN, AGACNP-BC [facility] Pgr: [phone] or if no answer [phone] 05/03/2018, 10:24 AM  Attending Note:  68 year old male presenting with respiratory failure from a COPD exacerbation.  Failed weaning yesterday but tolerating better today.  On exam, on TC but desaturating with coarse BS diffusely, febrile.  I reviewed CXR myself, trach is in a good position.  Will reculture.  Check PCT.  Hold of abx for now.  Vent at night and TC today as tolerated.  Suction for  secretion clearance.  PCCM will continue to follow.  The patient is critically ill with multiple organ systems failure and requires high complexity decision making for assessment and support, frequent evaluation and titration of therapies, application of advanced monitoring technologies and extensive interpretation of multiple databases.   Critical Care Time devoted to patient care services described in this note is  31  Minutes. This time reflects time of care of this signee Dr Jennet Maduro. This critical care time does not reflect procedure time, or teaching time or supervisory time of PA/NP/Med student/Med Resident etc but could involve care discussion time.  Rush Farmer, M.D. [facility] Pulmonary/Critical Care Medicine. Pager: [phone]. After hours pager: [phone].

## 2018-05-03 NOTE — Progress Notes (Signed)
Responds appropriately with "yes/no" head nods in response to questions asked regarding pain

## 2018-05-04 ENCOUNTER — Inpatient Hospital Stay (HOSPITAL_COMMUNITY): Payer: BLUE CROSS/BLUE SHIELD

## 2018-05-04 ENCOUNTER — Encounter (HOSPITAL_COMMUNITY): Payer: Self-pay | Admitting: Cardiology

## 2018-05-04 DIAGNOSIS — I4892 Unspecified atrial flutter: Secondary | ICD-10-CM

## 2018-05-04 LAB — LIPID PANEL
Cholesterol: 79 mg/dL (ref 0–200)
HDL: 15 mg/dL — AB (ref >40)
LDL Cholesterol: 47 mg/dL (ref 0–99)
Total CHOL/HDL Ratio: 5.3 RATIO
Triglycerides: 86 mg/dL (ref <150)
VLDL: 17 mg/dL (ref 0–40)

## 2018-05-04 LAB — RENAL FUNCTION PANEL
Albumin: 1.5 g/dL — ABNORMAL LOW (ref 3.5–5.0)
Anion gap: 9 (ref 5–15)
BUN: 19 mg/dL (ref 8–23)
CO2: 26 mmol/L (ref 22–32)
Calcium: 7.3 mg/dL — ABNORMAL LOW (ref 8.9–10.3)
Chloride: 108 mmol/L (ref 98–111)
Creatinine, Ser: 0.95 mg/dL (ref 0.61–1.24)
GFR calc Af Amer: >60 mL/min (ref >60)
GFR calc non Af Amer: >60 mL/min (ref >60)
GLUCOSE: 163 mg/dL — AB (ref 70–99)
PHOSPHORUS: 2.4 mg/dL — AB (ref 2.5–4.6)
Potassium: 3.2 mmol/L — ABNORMAL LOW (ref 3.5–5.1)
Sodium: 143 mmol/L (ref 135–145)

## 2018-05-04 LAB — GLUCOSE, CAPILLARY
Glucose-Capillary: 120 mg/dL — ABNORMAL HIGH (ref 70–99)
Glucose-Capillary: 126 mg/dL — ABNORMAL HIGH (ref 70–99)
Glucose-Capillary: 142 mg/dL — ABNORMAL HIGH (ref 70–99)
Glucose-Capillary: 147 mg/dL — ABNORMAL HIGH (ref 70–99)

## 2018-05-04 LAB — TRIGLYCERIDES: TRIGLYCERIDES: 89 mg/dL (ref <150)

## 2018-05-04 LAB — MAGNESIUM: Magnesium: 2.1 mg/dL (ref 1.7–2.4)

## 2018-05-04 LAB — PROCALCITONIN: Procalcitonin: 0.77 ng/mL

## 2018-05-04 MED ORDER — DILTIAZEM HCL 60 MG PO TABS
90.0000 mg | ORAL_TABLET | Freq: Four times a day (QID) | ORAL | Status: DC
  Administered 2018-05-04 – 2018-05-06 (×8): 90 mg via NASOGASTRIC
  Filled 2018-05-04 (×9): qty 2

## 2018-05-04 MED ORDER — POLYETHYLENE GLYCOL 3350 17 G PO PACK
17.0000 g | PACK | Freq: Once | ORAL | Status: AC
  Administered 2018-05-04: 17 g via ORAL
  Filled 2018-05-04: qty 1

## 2018-05-04 MED ORDER — POTASSIUM PHOSPHATES 15 MMOLE/5ML IV SOLN
10.0000 mmol | Freq: Once | INTRAVENOUS | Status: AC
  Administered 2018-05-04: 10 mmol via INTRAVENOUS
  Filled 2018-05-04: qty 3.33

## 2018-05-04 MED ORDER — PROPOFOL 1000 MG/100ML IV EMUL
5.0000 ug/kg/min | INTRAVENOUS | Status: DC
  Administered 2018-05-04: 5 ug/kg/min via INTRAVENOUS
  Administered 2018-05-05 (×2): 30 ug/kg/min via INTRAVENOUS
  Filled 2018-05-04 (×3): qty 100

## 2018-05-04 MED ORDER — POTASSIUM CHLORIDE 10 MEQ/50ML IV SOLN
10.0000 meq | INTRAVENOUS | Status: AC
  Administered 2018-05-04 (×4): 10 meq via INTRAVENOUS
  Filled 2018-05-04 (×4): qty 50

## 2018-05-04 MED ORDER — METOPROLOL TARTRATE 25 MG PO TABS
25.0000 mg | ORAL_TABLET | Freq: Two times a day (BID) | ORAL | Status: DC
  Administered 2018-05-04: 25 mg via ORAL
  Filled 2018-05-04: qty 1

## 2018-05-04 MED ORDER — LEVALBUTEROL HCL 0.63 MG/3ML IN NEBU: 0.6300 mg | INHALATION_SOLUTION | Freq: Four times a day (QID) | RESPIRATORY_TRACT | Status: DC

## 2018-05-04 MED ORDER — APIXABAN 5 MG PO TABS
5.0000 mg | ORAL_TABLET | Freq: Two times a day (BID) | ORAL | Status: DC
  Administered 2018-05-04 – 2018-05-06 (×6): 5 mg via ORAL
  Filled 2018-05-04 (×6): qty 1

## 2018-05-04 MED ORDER — IPRATROPIUM BROMIDE 0.02 % IN SOLN
0.5000 mg | Freq: Four times a day (QID) | RESPIRATORY_TRACT | Status: DC
  Administered 2018-05-04 – 2018-05-07 (×12): 0.5 mg via RESPIRATORY_TRACT
  Filled 2018-05-04 (×12): qty 2.5

## 2018-05-04 MED ORDER — SODIUM CHLORIDE 0.9 % IV SOLN
1.0000 g | Freq: Three times a day (TID) | INTRAVENOUS | Status: DC
  Administered 2018-05-04 – 2018-05-05 (×3): 1 g via INTRAVENOUS
  Filled 2018-05-04 (×4): qty 1

## 2018-05-04 MED ORDER — SODIUM CHLORIDE 0.9 % IV SOLN
1.0000 g | Freq: Three times a day (TID) | INTRAVENOUS | Status: DC
  Filled 2018-05-04: qty 1

## 2018-05-04 MED ORDER — LEVALBUTEROL HCL 0.63 MG/3ML IN NEBU
0.6300 mg | INHALATION_SOLUTION | Freq: Four times a day (QID) | RESPIRATORY_TRACT | Status: DC
  Administered 2018-05-04 – 2018-05-07 (×12): 0.63 mg via RESPIRATORY_TRACT
  Filled 2018-05-04 (×13): qty 3

## 2018-05-04 NOTE — Progress Notes (Signed)
Pt became extremely agitated and desynchronise with the vent. Pt very tachypneic with rates in the 50's. RN at bedside and initiated sedation for pt. Pt less agitated and rate decreasing slowly. RT will continue to monitor.

## 2018-05-04 NOTE — Plan of Care (Signed)
  Problem: Clinical Measurements: Goal: Ability to maintain clinical measurements within normal limits will improve Outcome: Progressing Goal: Respiratory complications will improve Outcome: Progressing Goal: Cardiovascular complication will be avoided Outcome: Progressing   Problem: Nutrition: Goal: Adequate nutrition will be maintained Outcome: Progressing   Problem: Coping: Goal: Level of anxiety will decrease Outcome: Progressing   Problem: Elimination: Goal: Will not experience complications related to bowel motility Outcome: Progressing   Problem: Safety: Goal: Ability to remain free from injury will improve Outcome: Progressing   Problem: Respiratory: Goal: Ability to maintain a clear airway will improve Outcome: Progressing Goal: Levels of oxygenation will improve Outcome: Progressing Goal: Ability to maintain adequate ventilation will improve Outcome: Progressing   Problem: Education: Goal: Knowledge of disease or condition will improve Outcome: Not Progressing Note:  Patient is alert, but orientation is in question.

## 2018-05-04 NOTE — Progress Notes (Signed)
28mcg=1ml of Fentanyl in vial wasted in sharps container in the medroom. Waste witnessed by Robinette Haines, RN.

## 2018-05-04 NOTE — Progress Notes (Signed)
Potassium 3.2. Contacted Elink. Waiting for orders.

## 2018-05-04 NOTE — Progress Notes (Signed)
RT NOTE: Patient noticed to be in distress on the vent with increase WOB, RR and HR. RN and RT made MD aware. MD gave RN orders and RT will continue to monitor.

## 2018-05-04 NOTE — Progress Notes (Signed)
eLink Physician-Brief Progress Note Patient Name: Kyle Lowery DOB: 23-Jan-1951 MRN: [id number]   Date of Service  05/04/2018  HPI/Events of Note  Hypokalemia  eICU Interventions  Elink electrolyte replacement protocol for K+        Kyle Lowery 05/04/2018, 5:17 AM

## 2018-05-04 NOTE — Progress Notes (Signed)
SLP Cancellation Note  Patient Details Name: Kyle Lowery MRN: [id number] DOB: 12-Oct-1950   Cancelled treatment:       Reason Eval/Treat Not Completed: Patient not medically ready - per MD note, pt continues to have copious secretions. Will follow for readiness.   Venita Sheffield Janelli Welling 05/04/2018, 2:21 PM  Pollyann Glen, M.A. [facility] Acute Environmental education officer [phone] Office [phone]

## 2018-05-04 NOTE — TOC Progression Note (Addendum)
Transition of Care [facility]) - Progression Note    Patient Details  Name: DACARI BECKSTRAND MRN: [id number] Date of Birth: November 25, 1950  Transition of Care [facility]) CM/SW Contact  Maryclare Labrador, RN Phone Number: 05/04/2018, 8:19 AM  Clinical Narrative:  Covering unit CM spoke directly with attending - referral for [facility] appropriate to be initiated.  CM gave referral to both [facility] and [facility] - CM awaiting bed offers to offer choice.  Update:  [facility] can not offer pt a bed at this time.  [facility] requires either; COVID 19 test or documentation as to why the test is not warranted.  Attending does not think pt is appropriate for test.  Attending will write note in Epic to state above.    As of 1612 - documentation not yet been entered in Epic that explains why pt was not tested for COV 19, [facility] can not offer bed without documentation in Epic.  CM on Monday will follow up with attending and [facility] to determine if referral is still appropriate and if required documentation can be provided.  CM [facility] has not discussed LTACH as an option with pt nor family due to this barrier.         Expected Discharge Plan and Services                                     Social Determinants of Health (SDOH) Interventions    Readmission Risk Interventions No flowsheet data found.

## 2018-05-04 NOTE — Consult Note (Addendum)
Cardiology Consultation:   Patient ID: Kyle Lowery MRN: [id number]; DOB: 11-01-1950  Admit date: 04/22/2018 Date of Consult: 05/04/2018  Primary Care Provider: Rosita Fire, MD Primary Cardiologist: Rozann Lesches, MD remote in [facility] Primary Electrophysiologist:  None    Patient Profile:   Kyle Lowery is a 68 y.o. male with a hx of CAD mild to moderate with 80% ostial diag, 50% pLAD, 30% pRCA  And 60% LCX -2012, HTN, GERD, neurocardiogenic, COPD  who is being seen today for the evaluation of atrial fib with admit 04/22/18 for influenza PNA, ARDS, COPD exacerbation and in A fib RVR at the request of Dr. Nelda Marseille.  History of Present Illness:   [name] with above cardiac hx and now admitted 04/22/18 with influenza PNA, ARDS, COPD exacerbation and A fib  RVR.  Intubated and vent on admit.   Extubated 04/24/18 has completed abx steroids.  On nebs.  Has had agitation and delirium, AKI resolved,  Hypernatremia now with free water.   Pt with trach.  PAF new CHA2DS2VASc 1-2 with hx of CAD.   Now on Eliquis 5 mg BID.    He is on lopressor 12.5 BID, ASA 81 mg, and dilt drip    EKG:  The EKGs was personally reviewed and demonstrates:  A flutter with RVR and at other times SR.  On 04/27/18 new T wave inversion V1-3 - on follow up EKG on 3/25  ST depression in lat leads with HR 131  Telemetry:  Telemetry was personally reviewed and demonstrates:  SR with PACs, burst of SVT 4-5 beats at times.   Echo with EF 55-60%, no LV RWMA.    Currently IV dilt is at 15 mg per hour. Pt sedated.  Did not answer questions.        Past Medical History:  Diagnosis Date   Carotid artery stenosis    COPD (chronic obstructive pulmonary disease) (HCC)    Coronary atherosclerosis of native coronary artery    Mild to moderate NOCAD with 80% ostial diagonal 1/12   Depression    Prior suicide attempt   Essential hypertension, benign    GERD (gastroesophageal reflux disease)    Esophageal dilatation     Mixed hyperlipidemia    Syncope    Neurocardiogenic    Past Surgical History:  Procedure Laterality Date   ESOPHAGOGASTRODUODENOSCOPY (EGD) WITH ESOPHAGEAL DILATION     INGUINAL HERNIA REPAIR     LEFT HEART CATH     Unable to Stent Blockage   Left orchiectomy     VASECTOMY       Home Medications:  Prior to Admission medications   Medication Sig Start Date End Date Taking? Authorizing Provider  albuterol (PROVENTIL HFA;VENTOLIN HFA) 108 (90 BASE) MCG/ACT inhaler Inhale 2 puffs into the lungs every 6 (six) hours as needed for wheezing or shortness of breath.   Yes [provider]  amLODipine (NORVASC) 5 MG tablet Take 5 mg by mouth daily.   Yes [provider]  lisinopril-hydrochlorothiazide (PRINZIDE,ZESTORETIC) 20-12.5 MG per tablet Take 0.5 tablets by mouth daily. Patient taking differently: Take 1 tablet by mouth daily.  07/10/13  Yes Satira Sark, MD  metoprolol succinate (TOPROL-XL) 25 MG 24 hr tablet Take 25 mg by mouth daily.   Yes [provider]  omeprazole (PRILOSEC) 20 MG capsule Take 20 mg by mouth daily.   Yes [provider]  oseltamivir (TAMIFLU) 75 MG capsule Take 75 mg by mouth 2 (two) times daily.  Yes [provider]  PARoxetine (PAXIL) 20 MG tablet Take 20 mg by mouth daily.   Yes [provider]  pravastatin (PRAVACHOL) 20 MG tablet Take 20 mg by mouth daily.   Yes [provider]    Inpatient Medications: Scheduled Meds:  apixaban  5 mg Oral BID   aspirin  81 mg Per Tube Daily   budesonide (PULMICORT) nebulizer solution  0.5 mg Nebulization BID   chlorhexidine gluconate (MEDLINE KIT)  15 mL Mouth Rinse BID   Chlorhexidine Gluconate Cloth  6 each Topical Daily   clonazePAM  1 mg Per Tube BID   folic acid  1 mg Oral Daily   free water  300 mL Per Tube Q8H   ipratropium  0.5 mg Nebulization Q6H   levalbuterol  0.63 mg Nebulization Q6H   mouth rinse  15 mL Mouth Rinse 10  times per day   metoprolol tartrate  12.5 mg Oral BID   multivitamin with minerals  1 tablet Oral Daily   pantoprazole sodium  40 mg Per Tube Q1200   polyethylene glycol  17 g Oral Once   pravastatin  20 mg Oral Daily   QUEtiapine  50 mg Oral Daily   sodium chloride flush  10-40 mL Intracatheter Q12H   thiamine  100 mg Oral Daily   Continuous Infusions:  sodium chloride Stopped (05/01/18 1601)   albumin human     ceFEPime (MAXIPIME) IV     dexmedetomidine (PRECEDEX) IV infusion Stopped (05/04/18 0817)   diltiazem (CARDIZEM) infusion 15 mg/hr (05/04/18 0902)   feeding supplement (VITAL AF 1.2 CAL) 1,000 mL (05/04/18 0159)   potassium chloride 50 mL/hr at 05/04/18 0900   potassium PHOSPHATE IVPB (in mmol)     PRN Meds: sodium chloride, acetaminophen **OR** acetaminophen, bisacodyl, docusate, fentaNYL (SUBLIMAZE) injection, sodium chloride flush  Allergies:   No Known Allergies  Social History:   Social History   Socioeconomic History   Marital status: Married    Spouse name: Not on file   Number of children: Not on file   Years of education: Not on file   Highest education level: Not on file  Occupational History   Occupation: Disabled  Scientist, product/process development strain: Not on file   Food insecurity:    Worry: Not on file    Inability: Not on file   Transportation needs:    Medical: Not on file    Non-medical: Not on file  Tobacco Use   Smoking status: Current Every Day Smoker    Packs/day: 1.00    Years: 53.00    Pack years: 53.00    Types: Cigarettes    Start date: 08/23/1963   Smokeless tobacco: Never Used   Tobacco comment: Peak rate of 2ppd  Substance and Sexual Activity   Alcohol use: No    Alcohol/week: 0.0 standard drinks   Drug use: No   Sexual activity: Not on file  Lifestyle   Physical activity:    Days per week: Not on file    Minutes per session: Not on file   Stress: Not on file  Relationships   Social  connections:    Talks on phone: Not on file    Gets together: Not on file    Attends religious service: Not on file    Active member of club or organization: Not on file    Attends meetings of clubs or organizations: Not on file    Relationship status: Not on file  Intimate partner violence:    Fear of current or ex partner: Not on file    Emotionally abused: Not on file    Physically abused: Not on file    Forced sexual activity: Not on file  Other Topics Concern   Not on file  Social History Narrative   Disabled - mainly Architect    Married - third marriage   Tobacco Use - Yes.    1 child      [facility] (02/12/16):   Originally from [place]. He has lived in [place] most of his life. Previously has worked as a Dealer and also in Architect. Unsure of asbestos exposure. No mold exposure. Currently has an outside cat. No bird exposure. Denies any travel.     Family History:    Family History  Problem Relation Age of Onset   Depression Father        Suicide age 19   Hypercholesterolemia Father    Hypertension Father    Hypertension Mother    Melanoma Mother    Hypertension Brother    Lung disease Neg Hx      ROS:  Please see the history of present illness.   Pt not answering questions from prior ROS General:+ colds + fevers, no weight changes Skin:no rashes or ulcers HEENT:no blurred vision, no congestion CV:see HPI PUL:see HPI GI:no diarrhea constipation or melena, no indigestion GU:no hematuria, no dysuria MS:no joint pain, no claudication Neuro:no syncope recently but + hx, no lightheadedness Endo:no diabetes, no thyroid disease Hx of carotid stenosis  All other ROS reviewed and negative.     Physical Exam/Data:   Vitals:   05/04/18 0802 05/04/18 0815 05/04/18 0830 05/04/18 0845  BP: (!) 132/59 (!) 114/57 108/75   Pulse: 99 (!) 109 100 (!) 108  Resp: (!) 25 (!) 24 (!) 29 (!) 22  Temp:      TempSrc:      SpO2: 100% 98% 95% 97%  Weight:        Height:        Intake/Output Summary (Last 24 hours) at 05/04/2018 0907 Last data filed at 05/04/2018 0900 Gross per 24 hour  Intake 2219.7 ml  Output 3625 ml  Net -1405.3 ml   Last 3 Weights 05/04/2018 05/03/2018 05/02/2018  Weight (lbs) 145 lb 8.1 oz 144 lb 13.5 oz 145 lb 11.6 oz  Weight (kg) 66 kg 65.7 kg 66.1 kg     Body mass index is 22.12 kg/m.  General:  Frail, thin male, in no acute distress sedated  HEENT: normal Lymph: no adenopathy Neck: no JVD, trach in place Endocrine:  No thryomegaly Vascular: No carotid bruits; pedal pulses 2+ bilaterally  Cardiac:  normal S1, S2; RRR; no murmur gallup rub or click  Lungs:  clear to auscultation bilaterally anterior position, no wheezing, rhonchi or rales  Abd: soft, nontender, no hepatomegaly  Ext: no edema Musculoskeletal:  No deformities, pt not following direction Skin: warm and dry  Neuro:  Sedated,   Psych:  sedated   Relevant CV Studies: 04/23/18 Echo  Complete  IMPRESSIONS    1. The left ventricle has normal systolic function, with an ejection fraction of 55-60%. The cavity size was normal. Left ventricular diastolic Doppler parameters are indeterminate. No evidence of left ventricular regional wall motion abnormalities.  2. The right ventricle has normal systolic function. The cavity was normal. There is no increase in right ventricular wall thickness. Right ventricular systolic pressure could not be assessed.  3. The aortic  valve is tricuspid Mild thickening of the aortic valve Mild calcification of the aortic valve. Aortic valve regurgitation is trivial by color flow Doppler.. Mild aortic annular calcification noted.  4. The mitral valve is grossly normal.  FINDINGS  Left Ventricle: The left ventricle has normal systolic function, with an ejection fraction of 55-60%. The cavity size was normal. There is no increase in left ventricular wall thickness. Left ventricular diastolic Doppler parameters are indeterminate.   No evidence of left ventricular regional wall motion abnormalities.. Right Ventricle: The right ventricle has normal systolic function. The cavity was normal. There is no increase in right ventricular wall thickness. Right ventricular systolic pressure could not be assessed. Left Atrium: left atrial size was normal in size Right Atrium: right atrial size was normal in size. Right atrial pressure is estimated at 3 mmHg. Interatrial Septum: No atrial level shunt detected by color flow Doppler. Pericardium: The pericardium was not well visualized. Mitral Valve: The mitral valve is grossly normal. Mitral valve regurgitation is not visualized by color flow Doppler. Tricuspid Valve: The tricuspid valve is normal in structure. Tricuspid valve regurgitation was not visualized by color flow Doppler. Aortic Valve: The aortic valve is tricuspid Mild thickening of the aortic valve Mild calcification of the aortic valve. Aortic valve regurgitation is trivial by color flow Doppler. Mild aortic annular calcification noted. Pulmonic Valve: The pulmonic valve was grossly normal. Pulmonic valve regurgitation is not visualized by color flow Doppler. No evidence of pulmonic stenosis. Pulmonary Artery: The pulmonary artery is not well seen. Venous: The inferior vena cava is normal in size with greater than 50% respiratory variability.     Laboratory Data:  Chemistry Recent Labs  Lab 05/02/18 0435 05/03/18 0419 05/04/18 0345  NA 137 144 143  K 3.6 3.2* 3.2*  CL 108 109 108  CO2 '25 26 26  '$ GLUCOSE 334* 143* 163*  BUN 31* 21 19  CREATININE 1.10 0.97 0.95  CALCIUM 6.7* 7.4* 7.3*  GFRNONAA >60 >60 >60  GFRAA >60 >60 >60  ANIONGAP 4* 9 9    Recent Labs  Lab 04/30/18 0407 05/04/18 0345  PROT 3.8*  --   ALBUMIN 1.3* 1.5*  AST 20  --   ALT 23  --   ALKPHOS 66  --   BILITOT 0.6  --    Hematology Recent Labs  Lab 05/01/18 0251  05/01/18 1921 05/02/18 0435 05/03/18 0419  WBC 10.3  --   --  7.2  13.5*  RBC 2.40*  --   --  2.66* 3.12*  HGB 8.1*   < > 10.3* 8.4* 9.7*  HCT 25.5*   < > 33.2* 26.8* 31.4*  MCV 106.3*  --   --  100.8* 100.6*  MCH 33.8  --   --  31.6 31.1  MCHC 31.8  --   --  31.3 30.9  RDW 16.4*  --   --  18.4* 17.1*  PLT 136*  --   --  123* 160   < > = values in this interval not displayed.   Cardiac EnzymesNo results for input(s): TROPONINI in the last 168 hours. No results for input(s): TROPIPOC in the last 168 hours.  BNPNo results for input(s): BNP, PROBNP in the last 168 hours.  DDimer No results for input(s): DDIMER in the last 168 hours.  Radiology/Studies:  Dg Abd 1 View  Result Date: 05/02/2018 CLINICAL DATA:  Nasogastric tube placement EXAM: ABDOMEN - 1 VIEW COMPARISON:  May 01, 2018 FINDINGS: Nasogastric tube tip and  side port are in the stomach. Visualized bowel gas pattern is unremarkable. No demonstrable bowel obstruction or free air. Lung bases clear. IMPRESSION: Nasogastric tube tip and side port in stomach. Visualized bowel gas pattern unremarkable. No evident free air. Electronically Signed   By: Lowella Grip III M.D.   On: 05/02/2018 10:51   Dg Abd 1 View  Result Date: 05/01/2018 CLINICAL DATA:  NG tube placement. EXAM: ABDOMEN - 1 VIEW COMPARISON:  05/01/2018 at 1349 hours FINDINGS: NG tube tip persists in the distal esophagus. The tube does not extend into the stomach and does not appear changed from the earlier exam. IMPRESSION: NG tube tip projects within the distal esophagus. Electronically Signed   By: Lajean Manes M.D.   On: 05/01/2018 14:17   Dg Abd 1 View  Result Date: 05/01/2018 CLINICAL DATA:  NG tube placement. EXAM: ABDOMEN - 1 VIEW COMPARISON:  04/23/2018. FINDINGS: Tip of the NG tube projects in the distal esophagus. It does not extend into the stomach. Bowel gas pattern is unremarkable. IMPRESSION: Nasogastric tube tip projects in the distal esophagus. It will need to be inserted at least 12 cm to allow the tube tip and its side  hole to fully enter the stomach. Electronically Signed   By: Lajean Manes M.D.   On: 05/01/2018 14:16   Dg Chest Port 1 View  Result Date: 05/04/2018 CLINICAL DATA:  Respiratory failure EXAM: PORTABLE CHEST 1 VIEW COMPARISON:  May 01, 2018 FINDINGS: Tracheostomy catheter tip is 6.0 cm above the carina. Central catheter tip is in the superior vena cava. Nasogastric tube tip and side port are below the diaphragm. No pneumothorax. There is focal airspace consolidation in the right mid lung. There is patchy infiltrate in the left base. Lungs elsewhere are clear. Heart size and pulmonary vascularity are normal. No adenopathy. No bone lesions. IMPRESSION: Tube and catheter positions as described without pneumothorax. Focal airspace opacity felt to represent pneumonia in the right mid lung and left base regions. Stable cardiac silhouette. Electronically Signed   By: Lowella Grip III M.D.   On: 05/04/2018 08:53   Dg Chest Port 1 View  Result Date: 05/01/2018 CLINICAL DATA:  Tracheostomy. EXAM: PORTABLE CHEST 1 VIEW COMPARISON:  05/01/2018 FINDINGS: Interval placement of tracheostomy tube in satisfactory radiographic position. Enteric catheter overlies the esophagus, tip at the expected location of the gastroesophageal junction. Second catheter is seen within the mid thorax terminating distally, 3-4 cm caudal to the diaphragm, etiology uncertain. Left PICC line is stable. Cardiomediastinal silhouette is normal. Mediastinal contours appear intact. There is no evidence of pleural effusion or pneumothorax. Interval increase in patchy bilateral airspace opacities with lower lobe predominance. Osseous structures are without acute abnormality. Soft tissues are grossly normal. IMPRESSION: 1. Interval placement of tracheostomy tube in satisfactory radiographic position. 2. Interval increase in patchy bilateral airspace opacities with lower lobe predominance. Findings are suspicious for atypical, possibly viral,  bilateral pneumonia. Electronically Signed   By: Fidela Salisbury M.D.   On: 05/01/2018 14:19   Dg Chest Port 1 View  Result Date: 05/01/2018 CLINICAL DATA:  Endotracheal to respiratory failure EXAM: PORTABLE CHEST 1 VIEW COMPARISON:  04/30/1999 FINDINGS: Endotracheal tube in good position. Left arm PICC tip in the SVC. NG tube in the stomach. COPD.  Improvement in bibasilar infiltrates.  Small right effusion. IMPRESSION: Endotracheal tube in good position. Improvement in bibasilar infiltrates. Electronically Signed   By: Franchot Gallo M.D.   On: 05/01/2018 07:09    Assessment and Plan:  1. PAFl on IV dilt at 15 and po lopressor 12.5 BID - rate now is SR. Mostly SR with SVT ? Change to po dilt ? Change to 90 every 6 hours per tube defer to Dr. Stanford Breed 2. Anticoagulation with Eliquis for CHA2DS2VASc of 1-2 with known CAD .  eliquis was started today  3. CAD with last cath 2012 and non obstructive disease in LAD, DIAG, RCA and LCX,  4. Elevated troponin at 0.16 on admit - EKG change on the 20th with T wave inversions.  Will repeat EKG.  No ST changes on today's 5. HTN  At home on amlodipine, lisinopril HCTz, toprol XL 25  6. HLD on Pravachol  7. Acute hypercarbic, hypoxemic resp failure with trach placed 3/24/acute exacerbation of COPD/influenza A/PNA  Antibiotics completed 3/20 per CCM 8. ICU agitation per CCM  Sedated with precedex 9. AKI  With Cr now 0.95  Improved  10. Hypokalemia at 3.2, replaced by CCM 11. Fever to 102.6 last night , per CCM blood cultures have been drawn      For questions or updates, please contact [place] Please consult [email or web address] for contact info under     Signed, Cecilie Kicks, NP  05/04/2018 9:07 AM As above, patient seen and examined.  Briefly he is a 69 year old male with past medical history of coronary artery disease, hypertension, COPD, gastroesophageal reflux disease admitted on March 15 with pneumonia and influenza A (course complicated  by tracheostomy) for evaluation of atrial fibrillation/flutter.  At time of admission patient was in atrial flutter but converted to sinus rhythm.  Telemetry personally reviewed and reveals sinus tachycardia with frequent PACs over the last 24 hours.  Patient does not respond to questions this morning other than opening eyes.  1 paroxysmal atrial fibrillation/flutter-rhythm over last 24 hours is sinus tachycardia with frequent PACs.  I will discontinue IV Cardizem and treat with 90 mg via NG tube every 6 hours.  Increase metoprolol to 25 mg twice daily.  Advance metoprolol as needed and as tolerated by blood pressure.  If blood pressure becomes an issue can change to amiodarone. CHADsvasc 3.  Continue apixaban at present dose.  2 coronary artery disease-given addition of apixaban will discontinue aspirin.  Continue low-dose statin.  3 hypertension-patient's blood pressure is controlled.  4 pneumonia/influenza-patient is status post tracheostomy.  Respiratory failure per critical care medicine.  Kirk Ruths, MD

## 2018-05-04 NOTE — Progress Notes (Signed)
Pt having increased workload of breathing, was placed back on ventilator by RT. Pt still becoming increasingly agitated while being turned to be cleaned up from BM. Pt given PRN fentanyl x3 doses with no improvement. HR in 140's jumping up frequently to the 180's. Cards called orders to given 2200 dose of 25 mg Metoprolol now.  Pt still breathing 40 times a minute and looks distressed. 02 sats holding in the 90's. RTs in room. Dr. Nelda Marseille paged and orders for STAT chest xray and Propofol drip for comfort.  Will follow orders and continue to monitor.

## 2018-05-04 NOTE — Progress Notes (Addendum)
NAME:  Kyle Lowery, MRN:  [id number], DOB:  1950-05-17, LOS: 72 ADMISSION DATE:  04/22/2018, CONSULTATION DATE:  04/22/2018 REFERRING MD:  Dr. Roderic Palau, CHIEF COMPLAINT:  Respiratory Distress    History of present illness   68 year old male with reported 2 weeks of cough, congestion, febrile, recently went to PCP, given Z-Pak. Admitted to [facility] 3/10-3/12 with flu-like symptoms, given Tamiflu. Arrives to [facility] ED on 3/15 with A.Fib RVR, hypokalemia, hypoxia, and dyspnea. CXR with bilateral infiltrates requiring intubation. Transferred to [facility] for further evaluation.   Per wife patient went home on 3/12 however was still not feeling normal (she thinks he left against medical advice). For the past few days patient has been weak and short of breath with very poor appetite. Extubated on 3/17 and had to be reintubated on 3/18.  Trached on 3/24.  Hospitalization complicated by ongoing agitation and Afib w/RVR with ongoing focus of weaning efforts.  Past Medical History  CAD, COPD, Depression   Significant Hospital Events   3/15  Presents to [facility] ED > Transferred to [facility]  3/17  Extubated 3/18  Re-intubated for increased WOB 3/22  Sedation needs / waxing agitated delirium 3/25   Weaning on ATC, episodes of AfibRVR, ongoing agitation, NGT placed   Consults:  PCCM   Procedures:  ETT 3/15 > 3/17 Trach 3/24 >>  Significant Diagnostic Tests:  3/16 TTE >> EF 55-60%, no wall motion abnormalities, normal RV, mild tricuspid thickening  Micro Data:  Tracheal Asp 3/15 >> Moderate Haemophilus influenzae Flu PCR 3/15 >> FLU A positive  Blood 3/15 >> negative RVP 3/15 >> neg  3/26 Trach asp >> 3/27 BC x2 >> 3/27 UC >>  Antimicrobials:  Cefepime 3/15 >> 3/20; 3/27 >> Vancomycin 3/15 >> 3/17 Azithromycin 3/16 >> 3/20 Tamiflu 3/16 >> 3/20  Interim history/subjective:  Some agitation overnight, remains on precedex 0.5 - more sleepy this morning but following intermittent commands  cardizem at 10 mg/hr with ongoing Afib/ RVR Ongoing copious secretions Stayed on ATC all day yesterday, rested on MV  Objective   Blood pressure (!) 132/59, pulse 99, temperature 99.1 F (37.3 C), temperature source Oral, resp. rate (!) 25, height 5\' 8"  (1.727 m), weight 66 kg, SpO2 100 %.    Vent Mode: PRVC FiO2 (%):  [40 %] 40 % Set Rate:  [20 bmp] 20 bmp Vt Set:  [580 mL] 580 mL PEEP:  [5 cmH20] 5 cmH20 Plateau Pressure:  [8 cmH20-16 cmH20] 16 cmH20   Intake/Output Summary (Last 24 hours) at 05/04/2018 0802 Last data filed at 05/04/2018 0700 Gross per 24 hour  Intake 2140.09 ml  Output 3525 ml  Net -1384.91 ml   Filed Weights   05/02/18 0443 05/03/18 0429 05/04/18 0359  Weight: 66.1 kg 65.7 kg 66 kg   EXAM General:  Older appearing male in NAD on MV HEENT: MM pink/moist, pupils 4/reactive, anicteric, midline trach Neuro: lethargic, follows intermittent commands CV: irir rates 100-140, +distal pulses PULM: even/non-labored, lungs bilaterally coarse, faint bibasilar rales GX:QJJH, non-tender, bs hypoactive  Extremities: warm/dry, dependent edema mostly in arms/ trace LE Skin: no rashes   Resolved Hospital Problem list   Lactic acidosis Hypotension secondary to sedation  Assessment & Plan:   Acute hypercarbic and hypoxemic respiratory failure s/p trach 3/24 Acute Exacerbation of COPD  Influenza A Haemophilus influenza pneumonia - completed abx 3/20 P: PS and attempt TC as able- clinically progressing well, tolerating ATC during day, rest MV at night Continue  pulmicort,  D/c duonebs, change to xopenex and atrovent q 6 Titrate O2 for sat of 88-92% SLP following Follow trach culture cxr today  Start cefepime Ongoing trach care- will need sutures out ~ 4/3  ICU Agitation / Delirium P:  seroquel 50mg  daily and klonopin 1mg  PT Q8  stop precedex and prn versed fentanyl prn only  Monitor QTc  Continue thiamine, folate, MVI  Acute kidney injury- resolved   Hypokalemia, Hypomag P: S/p KCL x 4 runs Additional kphos 10 mmol Trend renal panel, UOP, trend wts/ I/Os Replace electrolytes as indicated Avoid nephrotoxic agents, ensure adequate renal perfusion  History of coronary artery disease Hypertension P: Lopressor 12.5 mg BID  ASA 81 mg daily  Continue pravastatin per tube   Hypernatremia P: Free water 300mg  q 8hrs Follow Na trend   PAF- new - CHA2DS2-VASc score is actually 2 (age/ hx HTN) P:  metoprolol 12.5 mg BID - not increased due to borderline BP while on cardizem Continue cardizem, ongoing afib w/rvr Will start on eliquis  Will consult cardiology for further recs   Leukocytosis - additional tmax 102.2 overnight P:  Follow trach asp, will send BC/ UC Start cefepime Trend PCT/ WBC  GLOBAL: Case management following for LTAC placement  Best practice:  Diet: TF Pain/Anxiety/Delirium protocol (if indicated): prn fentanyl, PO seroquel/ klonopin VAP protocol (if indicated) DVT prophylaxis: Lovenox GI prophylaxis: protonix per tube Glucose control: SSI Mobility: PT/OT Code Status: FC Family Communication: family updated 3/26 Continue ICU    Labs   CBC: Recent Labs  Lab 04/28/18 0226 04/29/18 0145 05/01/18 0251 05/01/18 0407 05/01/18 1041 05/01/18 1921 05/02/18 0435 05/03/18 0419  WBC 10.5 11.4* 10.3  --   --   --  7.2 13.5*  NEUTROABS 8.6* 9.9*  --   --   --   --   --   --   HGB 9.3* 8.6* 8.1* 6.5* 6.8* 10.3* 8.4* 9.7*  HCT 28.1* 26.7* 25.5* 19.0* 20.0* 33.2* 26.8* 31.4*  MCV 101.1* 106.4* 106.3*  --   --   --  100.8* 100.6*  PLT 114* 97* 136*  --   --   --  123* 220    Basic Metabolic Panel: Recent Labs  Lab 04/28/18 0226  04/30/18 0407 05/01/18 0251 05/01/18 0407 05/01/18 1041 05/02/18 0435 05/03/18 0419 05/03/18 2000 05/04/18 0345  NA 147*   < > 145 145 146* 147* 137 144  --  143  K 3.5   < > 3.8 4.0 4.2 4.2 3.6 3.2*  --  3.2*  CL 116*   < > 115* 110  --   --  108 109  --  108  CO2 25    < > 25 27  --   --  25 26  --  26  GLUCOSE 92   < > 106* 138*  --  115* 334* 143*  --  163*  BUN 45*   < > 42* 42*  --   --  31* 21  --  19  CREATININE 0.89   < > 0.79 1.13  --   --  1.10 0.97  --  0.95  CALCIUM 7.3*   < > 6.8* 7.2*  --   --  6.7* 7.4*  --  7.3*  MG 1.8  --   --  1.4*  --   --  1.7 1.5* 2.4 2.1  PHOS 2.4*  --   --  3.7  --   --  2.6 2.6  --  2.4*   < > = values in this interval not displayed.   GFR: Estimated Creatinine Clearance: 71.4 mL/min (by C-G formula based on SCr of 0.95 mg/dL). Recent Labs  Lab 04/29/18 0145 05/01/18 0251 05/02/18 0435 05/03/18 0419 05/03/18 1600 05/04/18 0345  PROCALCITON  --   --   --   --  0.70 0.77  WBC 11.4* 10.3 7.2 13.5*  --   --     Liver Function Tests: Recent Labs  Lab 04/30/18 0407 05/04/18 0345  AST 20  --   ALT 23  --   ALKPHOS 66  --   BILITOT 0.6  --   PROT 3.8*  --   ALBUMIN 1.3* 1.5*   No results for input(s): LIPASE, AMYLASE in the last 168 hours. No results for input(s): AMMONIA in the last 168 hours.  ABG    Component Value Date/Time   PHART 7.435 05/01/2018 0407   PCO2ART 43.2 05/01/2018 0407   PO2ART 75.0 (L) 05/01/2018 0407   HCO3 28.9 (H) 05/01/2018 0407   TCO2 30 05/01/2018 0407   ACIDBASEDEF 1.4 04/25/2018 2320   O2SAT 95.0 05/01/2018 0407     Coagulation Profile: Recent Labs  Lab 05/01/18 0251  INR 1.4*    Cardiac Enzymes: No results for input(s): CKTOTAL, CKMB, CKMBINDEX, TROPONINI in the last 168 hours.  HbA1C: No results found for: HGBA1C  CBG: Recent Labs  Lab 05/03/18 1147 05/03/18 1529 05/03/18 1945 05/04/18 0022 05/04/18 0354  GLUCAP 142* 123* 115* 120* 147*   Kennieth Rad, MSN, AGACNP-BC [facility] Pgr: [phone] or if no answer [phone] 05/04/2018, 8:02 AM  Attending Note:  68 year old male with extensive PMH who presents to PCCM with respiratory failure, flu and COPD exacerbation that is now trached.  No events overnight, did not  tolerate TC overnight, had to be placed back on the vent.  I reviewed CXR myself, no acute disease noted.  On exam, lungs with coarse BS diffusely.  Discussed with PCCM-NP.  Will continue weaning efforts for now.  LTAC placement placed.  If not tolerating weaning through the weekend will likely need PEG on Monday.  PCCM will continue to manage.  The patient is critically ill with multiple organ systems failure and requires high complexity decision making for assessment and support, frequent evaluation and titration of therapies, application of advanced monitoring technologies and extensive interpretation of multiple databases.   Critical Care Time devoted to patient care services described in this note is  31  Minutes. This time reflects time of care of this signee Dr Jennet Maduro. This critical care time does not reflect procedure time, or teaching time or supervisory time of PA/NP/Med student/Med Resident etc but could involve care discussion time.  Rush Farmer, M.D. [facility] Pulmonary/Critical Care Medicine. Pager: [phone]. After hours pager: [phone].

## 2018-05-05 DIAGNOSIS — A498 Other bacterial infections of unspecified site: Secondary | ICD-10-CM

## 2018-05-05 DIAGNOSIS — I4821 Permanent atrial fibrillation: Secondary | ICD-10-CM

## 2018-05-05 DIAGNOSIS — G9341 Metabolic encephalopathy: Secondary | ICD-10-CM

## 2018-05-05 DIAGNOSIS — I4891 Unspecified atrial fibrillation: Secondary | ICD-10-CM

## 2018-05-05 DIAGNOSIS — Z7901 Long term (current) use of anticoagulants: Secondary | ICD-10-CM

## 2018-05-05 LAB — CBC
HEMATOCRIT: 28.2 % — AB (ref 39.0–52.0)
Hemoglobin: 8.9 g/dL — ABNORMAL LOW (ref 13.0–17.0)
MCH: 32.5 pg (ref 26.0–34.0)
MCHC: 31.6 g/dL (ref 30.0–36.0)
MCV: 102.9 fL — AB (ref 80.0–100.0)
NRBC: 0 % (ref 0.0–0.2)
Platelets: 179 10*3/uL (ref 150–400)
RBC: 2.74 MIL/uL — ABNORMAL LOW (ref 4.22–5.81)
RDW: 16.4 % — ABNORMAL HIGH (ref 11.5–15.5)
WBC: 8.5 10*3/uL (ref 4.0–10.5)

## 2018-05-05 LAB — CULTURE, RESPIRATORY W GRAM STAIN: Gram Stain: NONE SEEN

## 2018-05-05 LAB — RENAL FUNCTION PANEL
Albumin: 1.4 g/dL — ABNORMAL LOW (ref 3.5–5.0)
Anion gap: 6 (ref 5–15)
BUN: 17 mg/dL (ref 8–23)
CO2: 26 mmol/L (ref 22–32)
Calcium: 7.5 mg/dL — ABNORMAL LOW (ref 8.9–10.3)
Chloride: 109 mmol/L (ref 98–111)
Creatinine, Ser: 0.89 mg/dL (ref 0.61–1.24)
GFR calc Af Amer: >60 mL/min (ref >60)
GFR calc non Af Amer: >60 mL/min (ref >60)
Glucose, Bld: 124 mg/dL — ABNORMAL HIGH (ref 70–99)
Phosphorus: 2.5 mg/dL (ref 2.5–4.6)
Potassium: 3.1 mmol/L — ABNORMAL LOW (ref 3.5–5.1)
Sodium: 141 mmol/L (ref 135–145)

## 2018-05-05 LAB — URINE CULTURE: CULTURE: NO GROWTH

## 2018-05-05 LAB — BASIC METABOLIC PANEL
Anion gap: 7 (ref 5–15)
BUN: 16 mg/dL (ref 8–23)
CO2: 23 mmol/L (ref 22–32)
Calcium: 6.9 mg/dL — ABNORMAL LOW (ref 8.9–10.3)
Chloride: 111 mmol/L (ref 98–111)
Creatinine, Ser: 0.78 mg/dL (ref 0.61–1.24)
GFR calc Af Amer: >60 mL/min (ref >60)
GFR calc non Af Amer: >60 mL/min (ref >60)
Glucose, Bld: 116 mg/dL — ABNORMAL HIGH (ref 70–99)
Potassium: 2.9 mmol/L — ABNORMAL LOW (ref 3.5–5.1)
Sodium: 141 mmol/L (ref 135–145)

## 2018-05-05 LAB — GLUCOSE, CAPILLARY
Glucose-Capillary: 119 mg/dL — ABNORMAL HIGH (ref 70–99)
Glucose-Capillary: 99 mg/dL (ref 70–99)
Glucose-Capillary: 115 mg/dL — ABNORMAL HIGH (ref 70–99)
Glucose-Capillary: 101 mg/dL — ABNORMAL HIGH (ref 70–99)
Glucose-Capillary: 97 mg/dL (ref 70–99)

## 2018-05-05 LAB — PROCALCITONIN: Procalcitonin: 0.57 ng/mL

## 2018-05-05 LAB — MAGNESIUM: Magnesium: 1.5 mg/dL — ABNORMAL LOW (ref 1.7–2.4)

## 2018-05-05 LAB — PHOSPHORUS: Phosphorus: 2.4 mg/dL — ABNORMAL LOW (ref 2.5–4.6)

## 2018-05-05 MED ORDER — SODIUM CHLORIDE 0.9 % IV SOLN
2.0000 g | Freq: Three times a day (TID) | INTRAVENOUS | Status: DC
  Administered 2018-05-05 – 2018-05-09 (×13): 2 g via INTRAVENOUS
  Filled 2018-05-05 (×15): qty 2

## 2018-05-05 MED ORDER — METOPROLOL TARTRATE 50 MG PO TABS
50.0000 mg | ORAL_TABLET | Freq: Two times a day (BID) | ORAL | Status: DC
  Administered 2018-05-05: 50 mg via ORAL
  Filled 2018-05-05: qty 1

## 2018-05-05 MED ORDER — QUETIAPINE FUMARATE 50 MG PO TABS
50.0000 mg | ORAL_TABLET | Freq: Every day | ORAL | Status: DC
  Administered 2018-05-05 – 2018-05-06 (×2): 50 mg via ORAL
  Filled 2018-05-05 (×2): qty 1

## 2018-05-05 MED ORDER — POTASSIUM CHLORIDE 20 MEQ/15ML (10%) PO SOLN
40.0000 meq | Freq: Once | ORAL | Status: AC
  Administered 2018-05-05: 40 meq via ORAL
  Filled 2018-05-05: qty 30

## 2018-05-05 MED ORDER — HALOPERIDOL LACTATE 5 MG/ML IJ SOLN
5.0000 mg | Freq: Once | INTRAMUSCULAR | Status: AC
  Administered 2018-05-05: 5 mg via INTRAVENOUS
  Filled 2018-05-05: qty 1

## 2018-05-05 MED ORDER — FENTANYL CITRATE (PF) 100 MCG/2ML IJ SOLN
50.0000 ug | INTRAMUSCULAR | Status: DC | PRN
  Administered 2018-05-05 – 2018-05-08 (×8): 50 ug via INTRAVENOUS
  Administered 2018-05-08: 100 ug via INTRAVENOUS
  Administered 2018-05-08 (×2): 50 ug via INTRAVENOUS
  Filled 2018-05-05 (×12): qty 2

## 2018-05-05 MED ORDER — CLONAZEPAM 0.5 MG PO TABS
0.2500 mg | ORAL_TABLET | Freq: Three times a day (TID) | ORAL | Status: DC
  Administered 2018-05-05 – 2018-05-08 (×8): 0.25 mg
  Filled 2018-05-05 (×8): qty 1

## 2018-05-05 MED ORDER — METOPROLOL TARTRATE 50 MG PO TABS
50.0000 mg | ORAL_TABLET | Freq: Three times a day (TID) | ORAL | Status: DC
  Administered 2018-05-05 – 2018-05-06 (×2): 50 mg via ORAL
  Filled 2018-05-05 (×2): qty 1

## 2018-05-05 MED ORDER — POTASSIUM CHLORIDE 20 MEQ PO PACK: 40.0000 meq | PACK | Freq: Once | ORAL | Status: DC

## 2018-05-05 NOTE — Progress Notes (Signed)
SLP Cancellation Note  Patient Details Name: Kyle Lowery MRN: [id number] DOB: 1950-10-30   Cancelled treatment:       Reason Eval/Treat Not Completed: Patient not medically ready. Per RN pt remains on ventilator, with copious secretions. Will continue to follow.  Deneise Lever, [place], CCC-SLP Speech-Language Pathologist Acute Rehabilitation Services Pager: [phone] Office: [phone]    Aliene Altes 05/05/2018, 11:35 AM

## 2018-05-05 NOTE — Progress Notes (Signed)
NAME:  Kyle Lowery, MRN:  [id number], DOB:  03/30/1950, LOS: 68 ADMISSION DATE:  04/22/2018, CONSULTATION DATE:  04/22/2018 REFERRING MD:  Dr. Roderic Palau, CHIEF COMPLAINT:  Respiratory Distress    History of present illness   68 year old male with reported 2 weeks of cough, congestion, febrile, recently went to PCP, given Z-Pak. Admitted to [facility] 3/10-3/12 with flu-like symptoms, given Tamiflu. Arrives to [facility] ED on 3/15 with A.Fib RVR, hypokalemia, hypoxia, and dyspnea. CXR with bilateral infiltrates requiring intubation. Transferred to [facility] for further evaluation.   Per wife patient went home on 3/12 however was still not feeling normal (she thinks he left against medical advice). For the past few days patient has been weak and short of breath with very poor appetite. Extubated on 3/17 and had to be reintubated on 3/18.  Trached on 3/24.  Hospitalization complicated by ongoing agitation and Afib w/RVR with ongoing focus of weaning efforts.  Past Medical History  CAD, COPD, Depression   Significant Hospital Events   3/15  Presents to [facility] ED > Transferred to [facility]  3/17  Extubated 3/18  Re-intubated for increased WOB 3/22  Sedation needs / waxing agitated delirium 3/25   Weaning on ATC, episodes of AfibRVR, ongoing agitation, NGT placed   Consults:  PCCM   Procedures:  ETT 3/15 > 3/17 Trach 3/24 >>  Significant Diagnostic Tests:  3/16 TTE >> EF 55-60%, no wall motion abnormalities, normal RV, mild tricuspid thickening  Micro Data:  Tracheal Asp 3/15 >> Moderate Haemophilus influenzae Flu PCR 3/15 >> FLU A positive  Blood 3/15 >> negative RVP 3/15 >> neg  3/26 Trach asp >> 3/27 BC x2 >> 3/27 UC >>  Antimicrobials:  Cefepime 3/15 >> 3/20; 3/27 >> Vancomycin 3/15 >> 3/17 Azithromycin 3/16 >> 3/20 Tamiflu 3/16 >> 3/20  Interim history/subjective:  Agitation , adjusting sedation - challenging .  Not weaning due to agitation .  Copious secretions.  Rate control  improved    Objective   Blood pressure 117/61, pulse (!) 106, temperature 99.7 F (37.6 C), temperature source Oral, resp. rate (!) 24, height 5\' 8"  (1.727 m), weight 66 kg, SpO2 100 %.    Vent Mode: PRVC FiO2 (%):  [40 %-60 %] 50 % Set Rate:  [20 bmp] 20 bmp Vt Set:  [580 mL] 580 mL PEEP:  [5 cmH20] 5 cmH20 Plateau Pressure:  [17 cmH20-22 cmH20] 20 cmH20   Intake/Output Summary (Last 24 hours) at 05/05/2018 1420 Last data filed at 05/05/2018 1200 Gross per 24 hour  Intake 2425.68 ml  Output 2046 ml  Net 379.68 ml   Filed Weights   05/02/18 0443 05/03/18 0429 05/04/18 0359  Weight: 66.1 kg 65.7 kg 66 kg   EXAM General:  Elderly male on vent , sedated, sleepy  HEENT: MM dry , trach midline, dsg clean.  Neuro:  Follows commands per RN, sleepy this am  CV: IR/IR , HR 100  PULM: course BS bilaterally  MV:[facility], soft, BS +   Extremities: warm/dry , tr edema.  Skin: intact, no rash , skin ecchymosis scattered, thin skin   Resolved Hospital Problem list   Lactic acidosis Hypotension secondary to sedation  Assessment & Plan:   Acute hypercarbic and hypoxemic respiratory failure s/p trach 3/24 Acute Exacerbation of COPD  Influenza A Haemophilus influenza pneumonia Sputum + Psuedomonas (sensitive to cefepime)  - completed abx 3/20 P: PS and attempt TC as able- goal ATC during day, rest MV at night  xopenex and atrovent q 6 Titrate O2 for sat of 88-92% SLP following Follow trach culture cxr today  Cont cefepime Ongoing trach care- will need sutures out ~ 4/3  ICU Agitation / Delirium P:  seroquel 50mg  daily and klonopin 1mg  PT Q8  Off precedex and  versed fentanyl prn only  Monitor QTc  Continue thiamine, folate, MVI  Acute kidney injury- resolved  Hypokalemia, Hypomag P: K+ replaced 3/28   Trend renal panel, UOP, trend wts/ I/Os Replace electrolytes as indicated Avoid nephrotoxic agents, ensure adequate renal perfusion  History of coronary artery disease  Hypertension P: Lopressor 12.5 mg BID  ASA 81 mg daily  Continue pravastatin per tube   Hypernatremia P: Free water 300mg  q 8hrs Follow Na trend   PAF- new - CHA2DS2-VASc score is actually 2 (age/ hx HTN) P:  metoprolol 12.5 mg BID - not increased due to borderline BP while on cardizem Continue cardizem, ongoing afib w/rvr Cont eliquis  Cards following    Leukocytosis - additional tmax 102.2 overnight Sputum Psuedomonas +  P:  Follow Fx data  Cont  cefepime Trend PCT/ WBC  GLOBAL: Case management following for LTAC placement  Best practice:  Diet: TF Pain/Anxiety/Delirium protocol (if indicated): prn fentanyl, PO seroquel/ klonopin VAP protocol (if indicated) DVT prophylaxis: Lovenox GI prophylaxis: protonix per tube Glucose control: SSI Mobility: PT/OT Code Status: FC Family Communication: family updated 3/26 Continue ICU    Labs   CBC: Recent Labs  Lab 04/29/18 0145 05/01/18 0251  05/01/18 1041 05/01/18 1921 05/02/18 0435 05/03/18 0419 05/05/18 0732  WBC 11.4* 10.3  --   --   --  7.2 13.5* 8.5  NEUTROABS 9.9*  --   --   --   --   --   --   --   HGB 8.6* 8.1*   < > 6.8* 10.3* 8.4* 9.7* 8.9*  HCT 26.7* 25.5*   < > 20.0* 33.2* 26.8* 31.4* 28.2*  MCV 106.4* 106.3*  --   --   --  100.8* 100.6* 102.9*  PLT 97* 136*  --   --   --  123* 160 179   < > = values in this interval not displayed.    Basic Metabolic Panel: Recent Labs  Lab 05/01/18 0251  05/01/18 1041 05/02/18 0435 05/03/18 0419 05/03/18 2000 05/04/18 0345 05/05/18 0732  NA 145   < > 147* 137 144  --  143 141  141  K 4.0   < > 4.2 3.6 3.2*  --  3.2* 2.9*  3.1*  CL 110  --   --  108 109  --  108 111  109  CO2 27  --   --  25 26  --  26 23  26   GLUCOSE 138*  --  115* 334* 143*  --  163* 116*  124*  BUN 42*  --   --  31* 21  --  19 16  17   CREATININE 1.13  --   --  1.10 0.97  --  0.95 0.78  0.89  CALCIUM 7.2*  --   --  6.7* 7.4*  --  7.3* 6.9*  7.5*  MG 1.4*  --   --  1.7 1.5*  2.4 2.1 1.5*  PHOS 3.7  --   --  2.6 2.6  --  2.4* 2.4*  2.5   < > = values in this interval not displayed.   GFR: Estimated Creatinine Clearance: 76.2 mL/min (by C-G formula based on  SCr of 0.89 mg/dL). Recent Labs  Lab 05/01/18 0251 05/02/18 0435 05/03/18 0419 05/03/18 1600 05/04/18 0345 05/05/18 0732  PROCALCITON  --   --   --  0.70 0.77 0.57  WBC 10.3 7.2 13.5*  --   --  8.5    Liver Function Tests: Recent Labs  Lab 04/30/18 0407 05/04/18 0345 05/05/18 0732  AST 20  --   --   ALT 23  --   --   ALKPHOS 66  --   --   BILITOT 0.6  --   --   PROT 3.8*  --   --   ALBUMIN 1.3* 1.5* 1.4*   No results for input(s): LIPASE, AMYLASE in the last 168 hours. No results for input(s): AMMONIA in the last 168 hours.  ABG    Component Value Date/Time   PHART 7.435 05/01/2018 0407   PCO2ART 43.2 05/01/2018 0407   PO2ART 75.0 (L) 05/01/2018 0407   HCO3 28.9 (H) 05/01/2018 0407   TCO2 30 05/01/2018 0407   ACIDBASEDEF 1.4 04/25/2018 2320   O2SAT 95.0 05/01/2018 0407     Coagulation Profile: Recent Labs  Lab 05/01/18 0251  INR 1.4*    Cardiac Enzymes: No results for input(s): CKTOTAL, CKMB, CKMBINDEX, TROPONINI in the last 168 hours.  HbA1C: No results found for: HGBA1C  CBG: Recent Labs  Lab 05/04/18 0811 05/04/18 1133 05/04/18 1556 05/05/18 0748 05/05/18 1131  GLUCAP 126* 142* 119* 99 115*    Tammy Parrett NP-C  [name]  [phone]  05/05/2018

## 2018-05-05 NOTE — Progress Notes (Signed)
RT called to pt room pt having difficulty breathing. Pt in PSV/CPAP mode 15/5 and 35% FIO2. Pt not tolerating at this time. Pt extremely agitated, pulling at trach,pt very tachypneic in the upper 40's, pt suctioned with minimal return, pt returned to his night vent settings as ordered of PRVC 580 VT, Rate 20, 40% FIO2, PEEP 5. Pt RR improved some with full support, but still slightly tachypneic in the low to high 30's. RT will continue to monitor.

## 2018-05-05 NOTE — Progress Notes (Signed)
Discussed patient with nurse  VS are stable with O2 sats 128/44mmHg HR 100-120's and up into the 140's with agitation.    Continue Cardizem 90mg  q6 hours  Increase Lopressor to 50mg  BID and follow on tele.

## 2018-05-06 ENCOUNTER — Inpatient Hospital Stay (HOSPITAL_COMMUNITY): Payer: BLUE CROSS/BLUE SHIELD

## 2018-05-06 DIAGNOSIS — J9621 Acute and chronic respiratory failure with hypoxia: Secondary | ICD-10-CM

## 2018-05-06 LAB — BASIC METABOLIC PANEL
Anion gap: 9 (ref 5–15)
Anion gap: 5 (ref 5–15)
BUN: 17 mg/dL (ref 8–23)
BUN: 18 mg/dL (ref 8–23)
CHLORIDE: 109 mmol/L (ref 98–111)
CO2: 24 mmol/L (ref 22–32)
CO2: 25 mmol/L (ref 22–32)
Calcium: 7.3 mg/dL — ABNORMAL LOW (ref 8.9–10.3)
Calcium: 7.2 mg/dL — ABNORMAL LOW (ref 8.9–10.3)
Chloride: 110 mmol/L (ref 98–111)
Creatinine, Ser: 0.79 mg/dL (ref 0.61–1.24)
Creatinine, Ser: 0.72 mg/dL (ref 0.61–1.24)
GFR calc Af Amer: >60 mL/min (ref >60)
GFR calc Af Amer: >60 mL/min (ref >60)
GFR calc non Af Amer: >60 mL/min (ref >60)
GFR calc non Af Amer: >60 mL/min (ref >60)
Glucose, Bld: 113 mg/dL — ABNORMAL HIGH (ref 70–99)
Glucose, Bld: 109 mg/dL — ABNORMAL HIGH (ref 70–99)
Potassium: 2.8 mmol/L — ABNORMAL LOW (ref 3.5–5.1)
Potassium: 3.1 mmol/L — ABNORMAL LOW (ref 3.5–5.1)
Sodium: 142 mmol/L (ref 135–145)
Sodium: 140 mmol/L (ref 135–145)

## 2018-05-06 LAB — GLUCOSE, CAPILLARY
Glucose-Capillary: 100 mg/dL — ABNORMAL HIGH (ref 70–99)
Glucose-Capillary: 101 mg/dL — ABNORMAL HIGH (ref 70–99)
Glucose-Capillary: 103 mg/dL — ABNORMAL HIGH (ref 70–99)
Glucose-Capillary: 105 mg/dL — ABNORMAL HIGH (ref 70–99)
Glucose-Capillary: 136 mg/dL — ABNORMAL HIGH (ref 70–99)
Glucose-Capillary: 95 mg/dL (ref 70–99)
Glucose-Capillary: 96 mg/dL (ref 70–99)

## 2018-05-06 LAB — CBC
HEMATOCRIT: 25.9 % — AB (ref 39.0–52.0)
Hemoglobin: 8.3 g/dL — ABNORMAL LOW (ref 13.0–17.0)
MCH: 32.7 pg (ref 26.0–34.0)
MCHC: 32 g/dL (ref 30.0–36.0)
MCV: 102 fL — ABNORMAL HIGH (ref 80.0–100.0)
Platelets: 184 10*3/uL (ref 150–400)
RBC: 2.54 MIL/uL — ABNORMAL LOW (ref 4.22–5.81)
RDW: 16 % — ABNORMAL HIGH (ref 11.5–15.5)
WBC: 6.8 10*3/uL (ref 4.0–10.5)
nRBC: 0 % (ref 0.0–0.2)

## 2018-05-06 MED ORDER — FREE WATER
250.0000 mL | Freq: Three times a day (TID) | Status: DC
  Administered 2018-05-06 (×2): 250 mL

## 2018-05-06 MED ORDER — PAROXETINE HCL 20 MG PO TABS
20.0000 mg | ORAL_TABLET | Freq: Every day | ORAL | Status: DC
  Administered 2018-05-06 – 2018-05-09 (×4): 20 mg
  Filled 2018-05-06 (×4): qty 1

## 2018-05-06 MED ORDER — DILTIAZEM 12 MG/ML ORAL SUSPENSION
90.0000 mg | Freq: Three times a day (TID) | ORAL | Status: DC
  Administered 2018-05-06 – 2018-05-09 (×10): 90 mg
  Filled 2018-05-06 (×12): qty 9

## 2018-05-06 MED ORDER — POTASSIUM CHLORIDE 10 MEQ/50ML IV SOLN
10.0000 meq | INTRAVENOUS | Status: AC
  Administered 2018-05-06 (×8): 10 meq via INTRAVENOUS
  Filled 2018-05-06 (×8): qty 50

## 2018-05-06 MED ORDER — ADULT MULTIVITAMIN LIQUID CH
15.0000 mL | Freq: Every day | ORAL | Status: DC
  Administered 2018-05-06: 15 mL via ORAL
  Filled 2018-05-06: qty 15

## 2018-05-06 MED ORDER — METOPROLOL TARTRATE 25 MG/10 ML ORAL SUSPENSION
50.0000 mg | Freq: Three times a day (TID) | ORAL | Status: DC
  Administered 2018-05-06 – 2018-05-09 (×10): 50 mg
  Filled 2018-05-06 (×10): qty 20

## 2018-05-06 NOTE — Progress Notes (Signed)
SLP Cancellation Note  Patient Details Name: Kyle Lowery MRN: [id number] DOB: Jun 03, 1950   Cancelled treatment:       Reason Eval/Treat Not Completed: Patient not medically ready. Pt remains on the ventilator, per charting agitated. SLP will continue to follow.  Deneise Lever, [place], CCC-SLP Speech-Language Pathologist Acute Rehabilitation Services Pager: [phone] Office: [phone]    Aliene Altes 05/06/2018, 8:21 AM

## 2018-05-06 NOTE — Progress Notes (Signed)
NAME:  Kyle Lowery, MRN:  [id number], DOB:  [birth year], LOS: 70 ADMISSION DATE:  04/22/2018, CONSULTATION DATE:  04/22/2018 REFERRING MD:  Dr. Roderic Palau, CHIEF COMPLAINT:  Respiratory Distress    History of present illness   68 year old male with reported 2 weeks of cough, congestion, febrile, recently went to PCP, given Z-Pak. Admitted to [facility] 3/10-3/12 with flu-like symptoms, given Tamiflu. Arrives to [facility] ED on 3/15 with A.Fib RVR, hypokalemia, hypoxia, and dyspnea. CXR with bilateral infiltrates requiring intubation. Transferred to [facility] for further evaluation.   Per wife patient went home on 3/12 however was still not feeling normal (she thinks he left against medical advice). For the past few days patient has been weak and short of breath with very poor appetite. Extubated on 3/17 and had to be reintubated on 3/18.  Trached on 3/24.  Hospitalization complicated by ongoing agitation and Afib w/RVR with ongoing focus of weaning efforts.  Past Medical History  CAD, COPD, Depression   Significant Hospital Events   3/15  Presents to [facility] ED > Transferred to [facility]  3/17  Extubated 3/18  Re-intubated for increased WOB 3/22  Sedation needs / waxing agitated delirium 3/25   Weaning on ATC, episodes of AfibRVR, ongoing agitation, NGT placed   Consults:  PCCM   Procedures:  ETT 3/15 > 3/17 Trach 3/24 >>  Significant Diagnostic Tests:  3/16 TTE >> EF 55-60%, no wall motion abnormalities, normal RV, mild tricuspid thickening  Micro Data:  Tracheal Asp 3/15 >> Moderate Haemophilus influenzae Flu PCR 3/15 >> FLU A positive  Blood 3/15 >> negative RVP 3/15 >> neg  3/26 Trach asp >>Psuedomonas  3/27 BC x2 >> 3/27 UC >>  Antimicrobials:  Cefepime 3/15 >> 3/20; 3/27 >> Vancomycin 3/15 >> 3/17 Azithromycin 3/16 >> 3/20 Tamiflu 3/16 >> 3/20  Interim history/subjective:  Increase alertness today, following more commands.  Weaning on vent with good volumes. Good urine output  Intermittent tachycardia.  Cardizem changed to liquid form. Very deconditioned with generalized weakness.  Objective   Blood pressure (!) 128/54, pulse (!) 109, temperature (!) 100.9 F (38.3 C), temperature source Oral, resp. rate (!) 29, height 5\' 8"  (1.727 m), weight 64.4 kg, SpO2 100 %.    Vent Mode: CPAP;PSV FiO2 (%):  [35 %-50 %] 40 % Set Rate:  [20 bmp] 20 bmp Vt Set:  [580 mL] 580 mL PEEP:  [5 cmH20] 5 cmH20 Pressure Support:  [15 cmH20] 15 cmH20 Plateau Pressure:  [19 cmH20-22 cmH20] 19 cmH20   Intake/Output Summary (Last 24 hours) at 05/06/2018 1016 Last data filed at 05/06/2018 0900 Gross per 24 hour  Intake 2410.6 ml  Output 2915 ml  Net -504.4 ml   Filed Weights   05/03/18 0429 05/04/18 0359 05/06/18 0500  Weight: 65.7 kg 66 kg 64.4 kg   EXAM General: Elderly chronically ill-appearing male increase alertness in no acute distress   HEENT: MM dry, trach midline, dressing clean .  Neuro: Increase alertness, following commands, calm  CV: Irregular regular, no MRG   PULM: Breath sounds bilaterally on vent, weaning on pressure support   GI: Soft, nontender bowel sounds positive   Extremities: Warm/dry, trace edema   Skin: Intact, no rash, scattered ecchymotic areas with thin skin    Resolved Hospital Problem list   Lactic acidosis Hypotension secondary to sedation  Assessment & Plan:   Acute hypercarbic and hypoxemic respiratory failure s/p trach 3/24 Acute Exacerbation of COPD  Influenza A Haemophilus influenza  pneumonia 3/28 Sputum + Psuedomonas (sensitive to cefepime)   P: PS and attempt TC as able- goal ATC during day, rest MV at night  xopenex and atrovent q 6 Titrate O2 for sat of 88-92% SLP following Cont cefepime Ongoing trach care- will need sutures out ~ 4/3  ICU Agitation / Delirium P:  seroquel 50mg  daily and klonopin 1mg  PT Q8  fentanyl prn only  Monitor QTc  Continue thiamine, folate, MVI Restart home Paxil 3/29  Acute kidney injury-  resolved  Hypokalemia, Hypomag P: K+ replaced 3/29 Trend renal panel, UOP, trend wts/ I/Os Replace electrolytes as indicated Avoid nephrotoxic agents, ensure adequate renal perfusion  History of coronary artery disease Hypertension Tachycardia  P: Lopressor 12.5 mg BID  ASA 81 mg daily  Continue pravastatin per tube  Continue Cardizem  Hypernatremia P: Free water 250mg  q 8hrs Follow Na trend   PAF- new - CHA2DS2-VASc score is actually 2 (age/ hx HTN) P:  metoprolol 12.5 mg BID - not increased due to borderline BP while on cardizem Continue cardizem, ongoing afib w/rvr Cont eliquis  Cards following    Leukocytosis/fever -  Sputum Psuedomonas + 3/28 P:  Follow Fx data  Cont  cefepime Trend  WBC  GLOBAL: Case management following for LTAC placement  Best practice:  Diet: TF Pain/Anxiety/Delirium protocol (if indicated): prn fentanyl, PO seroquel/ klonopin VAP protocol (if indicated) DVT prophylaxis: Lovenox GI prophylaxis: protonix per tube Glucose control: SSI Mobility: PT/OT Code Status: FC Family Communication: family updated 3/26 Continue ICU    Labs   CBC: Recent Labs  Lab 05/01/18 0251  05/01/18 1921 05/02/18 0435 05/03/18 0419 05/05/18 0732 05/06/18 0307  WBC 10.3  --   --  7.2 13.5* 8.5 6.8  HGB 8.1*   < > 10.3* 8.4* 9.7* 8.9* 8.3*  HCT 25.5*   < > 33.2* 26.8* 31.4* 28.2* 25.9*  MCV 106.3*  --   --  100.8* 100.6* 102.9* 102.0*  PLT 136*  --   --  123* 160 179 184   < > = values in this interval not displayed.    Basic Metabolic Panel: Recent Labs  Lab 05/01/18 0251  05/02/18 0435 05/03/18 0419 05/03/18 2000 05/04/18 0345 05/05/18 0732 05/06/18 0307  NA 145   < > 137 144  --  143 141  141 142  K 4.0   < > 3.6 3.2*  --  3.2* 2.9*  3.1* 2.8*  CL 110  --  108 109  --  108 111  109 109  CO2 27  --  25 26  --  26 23  26 24   GLUCOSE 138*   < > 334* 143*  --  163* 116*  124* 113*  BUN 42*  --  31* 21  --  19 16  17 17    CREATININE 1.13  --  1.10 0.97  --  0.95 0.78  0.89 0.79  CALCIUM 7.2*  --  6.7* 7.4*  --  7.3* 6.9*  7.5* 7.3*  MG 1.4*  --  1.7 1.5* 2.4 2.1 1.5*  --   PHOS 3.7  --  2.6 2.6  --  2.4* 2.4*  2.5  --    < > = values in this interval not displayed.   GFR: Estimated Creatinine Clearance: 82.7 mL/min (by C-G formula based on SCr of 0.79 mg/dL). Recent Labs  Lab 05/02/18 0435 05/03/18 0419 05/03/18 1600 05/04/18 0345 05/05/18 0732 05/06/18 0307  PROCALCITON  --   --  0.70 0.77 0.57  --   WBC 7.2 13.5*  --   --  8.5 6.8    Liver Function Tests: Recent Labs  Lab 04/30/18 0407 05/04/18 0345 05/05/18 0732  AST 20  --   --   ALT 23  --   --   ALKPHOS 66  --   --   BILITOT 0.6  --   --   PROT 3.8*  --   --   ALBUMIN 1.3* 1.5* 1.4*   No results for input(s): LIPASE, AMYLASE in the last 168 hours. No results for input(s): AMMONIA in the last 168 hours.  ABG    Component Value Date/Time   PHART 7.435 05/01/2018 0407   PCO2ART 43.2 05/01/2018 0407   PO2ART 75.0 (L) 05/01/2018 0407   HCO3 28.9 (H) 05/01/2018 0407   TCO2 30 05/01/2018 0407   ACIDBASEDEF 1.4 04/25/2018 2320   O2SAT 95.0 05/01/2018 0407     Coagulation Profile: Recent Labs  Lab 05/01/18 0251  INR 1.4*    Cardiac Enzymes: No results for input(s): CKTOTAL, CKMB, CKMBINDEX, TROPONINI in the last 168 hours.  HbA1C: No results found for: HGBA1C  CBG: Recent Labs  Lab 05/05/18 1602 05/05/18 2049 05/05/18 2353 05/06/18 0609 05/06/18 0758  GLUCAP 101* 97 95 101* 103*    Brynnley Dayrit NP-C  [facility]  [phone]  05/06/2018

## 2018-05-06 NOTE — Progress Notes (Signed)
Patient continues to have issues with agitation which trigger his heart rate to increase.  HR better after increasing Lopressor to 50mg  TID.  Still goes up in the 120-130's with agitations.  When calm his HR is in the 90-110bpm.  Apparently there was an issue with the Cardizem having to be crushed to go into his feeding tube and not sure he has been getting adequate amounts.  This has been changed to liquid.  Will see how he responds to liquid cardizem this am and adjust BB as needed.

## 2018-05-06 NOTE — Progress Notes (Signed)
[facility] ADULT ICU REPLACEMENT PROTOCOL FOR AM LAB REPLACEMENT ONLY  The patient does apply for the [facility] Adult ICU Electrolyte Replacment Protocol based on the criteria listed below:   1. Is GFR >/= 40 ml/min? Yes.    Patient's GFR today is >60 2. Is urine output >/= 0.5 ml/kg/hr for the last 6 hours? Yes.   Patient's UOP is 3 ml/kg/hr 3. Is BUN < 60 mg/dL? Yes.    Patient's BUN today is 17 4. Abnormal electrolyte(s): k 2.8 5. Ordered repletion with: protocol 6. If a panic level lab has been reported, has the CCM MD in charge been notified? No..   Physician:    Ronda Fairly A 05/06/2018 4:52 AM

## 2018-05-07 LAB — GLUCOSE, CAPILLARY
GLUCOSE-CAPILLARY: 78 mg/dL (ref 70–99)
Glucose-Capillary: 74 mg/dL (ref 70–99)
Glucose-Capillary: 84 mg/dL (ref 70–99)
Glucose-Capillary: 127 mg/dL — ABNORMAL HIGH (ref 70–99)
Glucose-Capillary: 108 mg/dL — ABNORMAL HIGH (ref 70–99)

## 2018-05-07 LAB — BASIC METABOLIC PANEL
Anion gap: 6 (ref 5–15)
BUN: 15 mg/dL (ref 8–23)
CHLORIDE: 110 mmol/L (ref 98–111)
CO2: 24 mmol/L (ref 22–32)
Calcium: 7.3 mg/dL — ABNORMAL LOW (ref 8.9–10.3)
Creatinine, Ser: 0.72 mg/dL (ref 0.61–1.24)
GFR calc Af Amer: >60 mL/min (ref >60)
GFR calc non Af Amer: >60 mL/min (ref >60)
GLUCOSE: 90 mg/dL (ref 70–99)
Potassium: 3.1 mmol/L — ABNORMAL LOW (ref 3.5–5.1)
Sodium: 140 mmol/L (ref 135–145)

## 2018-05-07 LAB — MAGNESIUM: Magnesium: 1.5 mg/dL — ABNORMAL LOW (ref 1.7–2.4)

## 2018-05-07 MED ORDER — POTASSIUM CHLORIDE 10 MEQ/50ML IV SOLN
10.0000 meq | INTRAVENOUS | Status: AC
  Administered 2018-05-07 (×3): 10 meq via INTRAVENOUS
  Filled 2018-05-07 (×3): qty 50

## 2018-05-07 MED ORDER — ADULT MULTIVITAMIN LIQUID CH
15.0000 mL | Freq: Every day | ORAL | Status: DC
  Administered 2018-05-07 – 2018-05-09 (×3): 15 mL
  Filled 2018-05-07 (×3): qty 15

## 2018-05-07 MED ORDER — APIXABAN 5 MG PO TABS
5.0000 mg | ORAL_TABLET | Freq: Two times a day (BID) | ORAL | Status: DC
  Administered 2018-05-07 – 2018-05-09 (×5): 5 mg
  Filled 2018-05-07 (×5): qty 1

## 2018-05-07 MED ORDER — IPRATROPIUM-ALBUTEROL 0.5-2.5 (3) MG/3ML IN SOLN
3.0000 mL | Freq: Four times a day (QID) | RESPIRATORY_TRACT | Status: DC
  Administered 2018-05-07 – 2018-05-09 (×9): 3 mL via RESPIRATORY_TRACT
  Filled 2018-05-07 (×9): qty 3

## 2018-05-07 MED ORDER — PRO-STAT SUGAR FREE PO LIQD
30.0000 mL | Freq: Every day | ORAL | Status: DC
  Administered 2018-05-07 – 2018-05-09 (×3): 30 mL
  Filled 2018-05-07 (×3): qty 30

## 2018-05-07 MED ORDER — ALBUTEROL SULFATE (2.5 MG/3ML) 0.083% IN NEBU: 2.5000 mg | INHALATION_SOLUTION | RESPIRATORY_TRACT | Status: DC | PRN

## 2018-05-07 MED ORDER — QUETIAPINE FUMARATE 50 MG PO TABS
50.0000 mg | ORAL_TABLET | Freq: Every day | ORAL | Status: DC
  Administered 2018-05-07 – 2018-05-08 (×2): 50 mg
  Filled 2018-05-07 (×2): qty 1

## 2018-05-07 MED ORDER — JEVITY 1.2 CAL PO LIQD
1000.0000 mL | ORAL | Status: DC
  Administered 2018-05-08 – 2018-05-09 (×3): 1000 mL
  Filled 2018-05-07 (×4): qty 1000

## 2018-05-07 MED ORDER — ARFORMOTEROL TARTRATE 15 MCG/2ML IN NEBU
15.0000 ug | INHALATION_SOLUTION | Freq: Two times a day (BID) | RESPIRATORY_TRACT | Status: DC
  Administered 2018-05-07 – 2018-05-09 (×5): 15 ug via RESPIRATORY_TRACT
  Filled 2018-05-07 (×5): qty 2

## 2018-05-07 MED ORDER — VITAMIN B-1 100 MG PO TABS
100.0000 mg | ORAL_TABLET | Freq: Every day | ORAL | Status: DC
  Administered 2018-05-07 – 2018-05-09 (×3): 100 mg
  Filled 2018-05-07 (×3): qty 1

## 2018-05-07 MED ORDER — ACETAMINOPHEN 160 MG/5ML PO SOLN
650.0000 mg | Freq: Four times a day (QID) | ORAL | Status: DC | PRN
  Administered 2018-05-07: 650 mg

## 2018-05-07 MED ORDER — PRAVASTATIN SODIUM 10 MG PO TABS
20.0000 mg | ORAL_TABLET | Freq: Every day | ORAL | Status: DC
  Administered 2018-05-07 – 2018-05-09 (×3): 20 mg
  Filled 2018-05-07 (×3): qty 2

## 2018-05-07 MED ORDER — MAGNESIUM SULFATE 2 GM/50ML IV SOLN
2.0000 g | Freq: Once | INTRAVENOUS | Status: AC
  Administered 2018-05-07: 2 g via INTRAVENOUS
  Filled 2018-05-07: qty 50

## 2018-05-07 MED ORDER — KCL IN DEXTROSE-NACL 20-5-0.45 MEQ/L-%-% IV SOLN
INTRAVENOUS | Status: DC
  Administered 2018-05-07: 1000 mL via INTRAVENOUS
  Administered 2018-05-08: 08:00:00 via INTRAVENOUS
  Filled 2018-05-07 (×2): qty 1000

## 2018-05-07 MED ORDER — JEVITY 1.2 CAL PO LIQD
1000.0000 mL | ORAL | Status: DC
  Administered 2018-05-07: 1000 mL
  Filled 2018-05-07: qty 1000

## 2018-05-07 MED ORDER — FOLIC ACID 1 MG PO TABS
1.0000 mg | ORAL_TABLET | Freq: Every day | ORAL | Status: DC
  Administered 2018-05-07 – 2018-05-09 (×3): 1 mg
  Filled 2018-05-07 (×3): qty 1

## 2018-05-07 MED ORDER — ACETAMINOPHEN 325 MG PO TABS
650.0000 mg | ORAL_TABLET | Freq: Four times a day (QID) | ORAL | Status: DC | PRN
  Administered 2018-05-09: 650 mg
  Filled 2018-05-07: qty 2

## 2018-05-07 MED ORDER — ACETAMINOPHEN 650 MG RE SUPP: 650.0000 mg | Freq: Four times a day (QID) | RECTAL | Status: DC | PRN

## 2018-05-07 NOTE — TOC Initial Note (Addendum)
Transition of Care [facility]) - Initial/Assessment Note    Patient Details  Name: Kyle Lowery MRN: [id number] Date of Birth: 08/22/1950  Transition of Care [facility]) CM/SW Contact:    Midge Minium RN, BSN, NCM-BC, ACM-RN [phone] Phone Number: 05/07/2018, 3:49 PM  Clinical Narrative:                 CB received from patients spouse, Kyle Lowery, to discuss POC and LTACH referral. CM provided [facility] choice [facility], with [facility] selected by patients spouse. Patient will require insurance auth with BCBS, with spouse made aware. CM explained the LTACH process and assessed for additional transitional needs; patients spouse agreeable to receiving a call from the [facility] liaison, to provide additional information on the [facility] process. CM discussed the referral with Corrina, [facility] liaison, with spouse contact information provided.CM team will continue to follow.   Expected Discharge Plan: Long Term Acute Care (LTAC) Barriers to Discharge: Insurance Authorization   Patient Goals and CMS Choice Patient states their goals for this hospitalization and ongoing recovery are:: Patient unable to participate in goal setting; on vent support CMS Medicare.gov Compare Post Acute Care list provided to:: Patient Represenative (must comment)(Kyle Lowery (spouse)) Choice offered to / list presented to : Spouse  Expected Discharge Plan and Services Expected Discharge Plan: Long Term Acute Care (LTAC) In-house Referral: NA Discharge Planning Services: CM Consult Post Acute Care Choice: Long Term Acute Care (LTAC) Living arrangements for the past 2 months: Single Family Home                 DME Arranged: N/A DME Agency: NA HH Arranged: NA HH Agency: NA  Prior Living Arrangements/Services Living arrangements for the past 2 months: Single Family Home Lives with:: Self, Spouse Patient language and need for interpreter reviewed:: Yes Do you feel safe going back to the  place where you live?: Yes      Need for Family Participation in Patient Care: Yes (Comment) Care giver support system in place?: Yes (comment)   Criminal Activity/Legal Involvement Pertinent to Current Situation/Hospitalization: No - Comment as needed  Activities of Daily Living Home Assistive Devices/Equipment: None ADL Screening (condition at time of admission) Patient's cognitive ability adequate to safely complete daily activities?: Yes Is the patient deaf or have difficulty hearing?: No Does the patient have difficulty seeing, even when wearing glasses/contacts?: No Does the patient have difficulty concentrating, remembering, or making decisions?: No Patient able to express need for assistance with ADLs?: Yes Does the patient have difficulty dressing or bathing?: No Independently performs ADLs?: Yes (appropriate for developmental age) Does the patient have difficulty walking or climbing stairs?: Yes Weakness of Legs: Both Weakness of Arms/Hands: None  Permission Sought/Granted Permission sought to share information with : Case Manager, Customer service manager Permission granted to share information with : Yes, Verbal Permission Granted(Verbal permission given by patients spouse, Kyle Lowery)     Permission granted to share info w AGENCY: [facility]        Emotional Assessment Appearance:: Appears older than stated age Attitude/Demeanor/Rapport: Unable to Assess Affect (typically observed): Unable to Assess Orientation: : Oriented to Self Alcohol / Substance Use: Not Applicable Psych Involvement: No (comment)  Admission diagnosis:  Endotracheal tube present [Z97.8] Acute respiratory failure ([place]) [J96.00] Patient Active Problem List   Diagnosis Date Noted  . Malnutrition of moderate degree 04/24/2018  . Endotracheal tube present   . Ventilator dependent ([place])   . Influenzal pneumonia   . Acute  exacerbation of chronic obstructive pulmonary disease  (COPD) ([place])   . Pneumonia 04/22/2018  . Acute respiratory failure ([place]) 04/22/2018  . Multiple lung nodules on CT 02/12/2016  . Dysphagia 02/12/2016  . Depression 02/12/2016  . GERD (gastroesophageal reflux disease) 02/12/2016  . Pulmonary emphysema ([place]) 02/12/2016  . COPD, moderate ([place]) 04/22/2013  . Headache(784.0) 03/11/2011  . Vasovagal syncope 03/11/2011  . Tobacco use disorder 02/18/2010  . CORONARY ATHEROSCLEROSIS NATIVE CORONARY ARTERY 02/18/2010  . HYPERLIPIDEMIA 02/17/2010  . Essential hypertension, benign 02/17/2010   PCP:  Rosita Fire, MD Pharmacy:   [facility] [address], [place] [address] Phone: [phone] Fax: [place], [place] [employer] [place] [address] Phone: [phone] Fax: [phone]     Social Determinants of Health (SDOH) Interventions    Readmission Risk Interventions Readmission Risk Prevention Plan 05/07/2018  Transportation Screening Complete  PCP or Specialist Appt within 3-5 Days Not Complete  Not Complete comments Patient will transition to [place] or [place] Complete  Social Work Consult for [place] Planning/Counseling Complete  Palliative Care Screening Not Applicable  Medication Review Press photographer) Complete  Some recent data might be hidden

## 2018-05-07 NOTE — Progress Notes (Signed)
Progress Note  Patient Name: Kyle Lowery Date of Encounter: 05/07/2018  Primary Cardiologist: Rozann Lesches, MD   Subjective   Alert; shakes head no to CP or dyspnea; s/p Trach  Inpatient Medications    Scheduled Meds: . apixaban  5 mg Oral BID  . budesonide (PULMICORT) nebulizer solution  0.5 mg Nebulization BID  . chlorhexidine gluconate (MEDLINE KIT)  15 mL Mouth Rinse BID  . Chlorhexidine Gluconate Cloth  6 each Topical Daily  . clonazePAM  0.25 mg Per Tube Q8H  . diltiazem  90 mg Per Tube H2Z  . folic acid  1 mg Oral Daily  . free water  250 mL Per Tube Q8H  . ipratropium  0.5 mg Nebulization Q6H  . levalbuterol  0.63 mg Nebulization Q6H  . mouth rinse  15 mL Mouth Rinse 10 times per day  . metoprolol tartrate  50 mg Per Tube Q8H  . multivitamin  15 mL Oral Daily  . pantoprazole sodium  40 mg Per Tube Q1200  . PARoxetine  20 mg Per Tube Daily  . pravastatin  20 mg Oral Daily  . QUEtiapine  50 mg Oral QHS  . sodium chloride flush  10-40 mL Intracatheter Q12H  . thiamine  100 mg Oral Daily   Continuous Infusions: . sodium chloride Stopped (05/07/18 0634)  . ceFEPime (MAXIPIME) IV 200 mL/hr at 05/07/18 0700  . feeding supplement (VITAL AF 1.2 CAL) 60 mL/hr at 05/05/18 0200  . potassium chloride 50 mL/hr at 05/07/18 2248   PRN Meds: sodium chloride, acetaminophen **OR** acetaminophen, bisacodyl, docusate, fentaNYL (SUBLIMAZE) injection, sodium chloride flush   Vital Signs    Vitals:   05/07/18 0400 05/07/18 0500 05/07/18 0600 05/07/18 0700  BP: (!) 152/72 (!) 157/72 (!) 149/90 (!) 159/68  Pulse:  (!) 104 (!) 104 (!) 109  Resp: 20 (!) 21 (!) 23 (!) 23  Temp: 99.8 F (37.7 C)     TempSrc: Oral     SpO2:  91% (!) 79% 100%  Weight:   62.9 kg   Height:        Intake/Output Summary (Last 24 hours) at 05/07/2018 0728 Last data filed at 05/07/2018 0700 Gross per 24 hour  Intake 2123.4 ml  Output 1050 ml  Net 1073.4 ml   Last 3 Weights 05/07/2018  05/06/2018 05/04/2018  Weight (lbs) 138 lb 10.7 oz 141 lb 15.6 oz 145 lb 8.1 oz  Weight (kg) 62.9 kg 64.4 kg 66 kg      Telemetry    Sinus with pacs and PVCs- Personally Reviewed  Physical Exam   GEN: No acute distress.   Neck: s/p trach Cardiac: RRR Respiratory: CTA anteriorly GI: Soft, nontender, non-distended  MS: No edema Neuro:  Nonfocal   Labs    Chemistry Recent Labs  Lab 05/04/18 0345 05/05/18 0732 05/06/18 0307 05/06/18 2018 05/07/18 0340  NA 143 141  141 142 140 140  K 3.2* 2.9*  3.1* 2.8* 3.1* 3.1*  CL 108 111  109 109 110 110  CO2 '26 23  26 24 25 24  '$ GLUCOSE 163* 116*  124* 113* 109* 90  BUN '19 16  17 17 18 15  '$ CREATININE 0.95 0.78  0.89 0.79 0.72 0.72  CALCIUM 7.3* 6.9*  7.5* 7.3* 7.2* 7.3*  ALBUMIN 1.5* 1.4*  --   --   --   GFRNONAA >60 >60  >60 >60 >60 >60  GFRAA >60 >60  >60 >60 >60 >60  ANIONGAP 9 7  $'6 9 5 6     'Q$ Hematology Recent Labs  Lab 05/03/18 0419 05/05/18 0732 05/06/18 0307  WBC 13.5* 8.5 6.8  RBC 3.12* 2.74* 2.54*  HGB 9.7* 8.9* 8.3*  HCT 31.4* 28.2* 25.9*  MCV 100.6* 102.9* 102.0*  MCH 31.1 32.5 32.7  MCHC 30.9 31.6 32.0  RDW 17.1* 16.4* 16.0*  PLT 160 179 184     Radiology    Dg Chest Port 1 View  Result Date: 05/06/2018 CLINICAL DATA:  Respiratory failure. EXAM: PORTABLE CHEST 1 VIEW COMPARISON:  05/04/2018 FINDINGS: Patient rotated to the right. Tracheostomy appropriately positioned. Nasogastric tube extends beyond the inferior aspect of the film. Left-sided PICC line tip at mid SVC. Normal heart size. No pleural effusion or pneumothorax. Shifting bilateral airspace disease. Minimal improvement in right lower lobe aeration with slight increase in left upper and left lower lobe pulmonary opacities. Right upper lobe pulmonary opacity is relatively similar. IMPRESSION: Minimal progression of multifocal airspace disease, most consistent with pneumonia. Electronically Signed   By: Abigail Miyamoto M.D.   On: 05/06/2018  07:06    Patient Profile     68 year old male with past medical history of coronary artery disease, hypertension, COPD, gastroesophageal reflux disease admitted on March 15 with pneumonia and influenza A (course complicated by tracheostomy) for evaluation of atrial fibrillation/flutter.  At time of admission patient was in atrial flutter but converted to sinus rhythm.   Assessment & Plan    1 paroxysmal atrial fibrillation/flutter-patient remains in sinus rhythm.  Continue present dose of metoprolol and Cardizem for rate control if atrial flutter recurs.  Continue apixaban at present dose.    2 coronary artery disease-continue statin.  No aspirin given need for apixaban.  3 hypertension-blood pressure reasonably well controlled.  Continue present medications and follow.  4 pneumonia/influenza-patient is status post tracheostomy.  Respiratory failure per critical care medicine.  5 hypokalemia/hypomagnesemia-supplement.  For questions or updates, please contact [place] Please consult [email or web address] for contact info under        Signed, Kirk Ruths, MD  05/07/2018, 7:28 AM

## 2018-05-07 NOTE — Progress Notes (Signed)
[facility] ADULT ICU REPLACEMENT PROTOCOL FOR AM LAB REPLACEMENT ONLY  The patient does apply for the [facility] Adult ICU Electrolyte Replacment Protocol based on the criteria listed below:   1. Is GFR >/= 40 ml/min? Yes.    Patient's GFR today is >60 2. Is urine output >/= 0.5 ml/kg/hr for the last 6 hours? Yes.   Patient's UOP is 1.2 ml/kg/hr 3. Is BUN < 60 mg/dL? Yes.    Patient's BUN today is 15 4. Abnormal electrolyte(s): K 3.1 5. Ordered repletion with: protocol 6. If a panic level lab has been reported, has the CCM MD in charge been notified? No..   Physician:    Ronda Fairly A 05/07/2018 5:58 AM

## 2018-05-07 NOTE — Progress Notes (Signed)
Called eLink and notified Elzie Rings RN that pt pulled out his NG tube around 2345  Has a small abrasion to left nare  Per Elzie Rings, hold off on re-inserting NG until MD sees pt tomorrow.

## 2018-05-07 NOTE — Progress Notes (Signed)
There is no clinical indication for COVID-19.  He has no know exposure risk.  He has alternative explanation for his infections (Influenza A, Haemophilus, Pseudomonas, Viridans streptococcus).  No indication to perform COVID 19 testing.  Chesley Mires, MD [facility] Pulmonary/Critical Care 05/07/2018, 3:12 PM

## 2018-05-07 NOTE — Procedures (Signed)
Cortrak  Person Inserting Tube:  Kyle Lowery, RD Tube Type:  Cortrak - 43 inches Tube Location:  Right nare Initial Placement:  Postpyloric Secured by: Bridle Technique Used to Measure Tube Placement:  Documented cm marking at nare/ corner of mouth Cortrak Secured At:  80 cm    Cortrak Tube Team Note:  Consult received to place a Cortrak feeding tube.   No x-ray is required. RN may begin using tube.    If the tube becomes dislodged please keep the tube and contact the Cortrak team at [email or web address] (password [email or web address]) for replacement.  If after hours and replacement cannot be delayed, place a NG tube and confirm placement with an abdominal x-ray.    [place], [place], [place] Pager [phone] After Hours Pager

## 2018-05-07 NOTE — Progress Notes (Signed)
Physical Therapy Treatment Patient Details Name: Kyle Lowery MRN: [id number] DOB: 1950/10/04 Today's Date: 05/07/2018    History of Present Illness Pt transferred from [facility] with Acute hypercarbic and hypoxemic respiratory failure and copd exacerbation due to flu+ and PNA. Pt intubated 3/15 -3/18 and reintubated 3/19. Pt trached on 3/24.     PT Comments    Patient seen for activity progression and OOB to chair. +2 assist for safety and impulsivity. Tolerated session well with VSS. Noted improvements in functional ability today. Current POC remains appropriate at this time.  Follow Up Recommendations  LTACH     Equipment Recommendations  Other (comment)(To be determined)    Recommendations for Other Services OT consult     Precautions / Restrictions Precautions Precautions: Fall;Other (comment) Precaution Comments: trach, vent Restrictions Weight Bearing Restrictions: No    Mobility  Bed Mobility Overal bed mobility: Needs Assistance Bed Mobility: Supine to Sit     Supine to sit: Min assist;+2 for safety/equipment        Transfers Overall transfer level: Needs assistance Equipment used: 1 person hand held assist Transfers: Sit to/from [facility] Sit to Stand: Min assist;+2 physical assistance Stand pivot transfers: Mod assist;+2 physical assistance       General transfer comment: Min assist to power to standing, moderate assist for stbaility during transition OOB to chair. +2 for safety and line management.  Ambulation/Gait             General Gait Details: deferred at this time   Stairs             Wheelchair Mobility    Modified Rankin (Stroke Patients Only)       Balance Overall balance assessment: Needs assistance Sitting-balance support: Bilateral upper extremity supported;Feet supported Sitting balance-Leahy Scale: Fair Sitting balance - Comments: Sat EOB x 5 minutes with mod to max assist. Postural  control: (Anterior lean)                                  Cognition Arousal/Alertness: Awake/alert Behavior During Therapy: Restless;Impulsive Overall Cognitive Status: Impaired/Different from baseline Area of Impairment: Attention;Following commands;Safety/judgement;Problem solving;Awareness                   Current Attention Level: Sustained   Following Commands: Follows one step commands with increased time Safety/Judgement: Decreased awareness of safety Awareness: Intellectual Problem Solving: Slow processing;Requires verbal cues;Requires tactile cues General Comments: Pt followed 1 step commands with incr time and inconsistently      Exercises      General Comments        Pertinent Vitals/Pain Faces Pain Scale: No hurt    Home Living                      Prior Function            PT Goals (current goals can now be found in the care plan section) Acute Rehab PT Goals Patient Stated Goal: unable to state PT Goal Formulation: Patient unable to participate in goal setting Time For Goal Achievement: 05/17/18 Potential to Achieve Goals: Fair Progress towards PT goals: Progressing toward goals    Frequency    Min 2X/week      PT Plan Current plan remains appropriate    Co-evaluation              AM-PAC PT "6 Clicks" Mobility  Outcome Measure  Help needed turning from your back to your side while in a flat bed without using bedrails?: A Lot Help needed moving from lying on your back to sitting on the side of a flat bed without using bedrails?: A Lot Help needed moving to and from a bed to a chair (including a wheelchair)?: A Lot Help needed standing up from a chair using your arms (e.g., wheelchair or bedside chair)?: A Lot Help needed to walk in hospital room?: Total Help needed climbing 3-5 steps with a railing? : Total 6 Click Score: 10    End of Session Equipment Utilized During Treatment: Oxygen(vent) Activity  Tolerance: Patient limited by fatigue Patient left: in chair;with call bell/phone within reach;with chair alarm set;with nursing/sitter in room Nurse Communication: Mobility status;Need for lift equipment PT Visit Diagnosis: Other abnormalities of gait and mobility (R26.89);Muscle weakness (generalized) (M62.81)     Time: 8280-0349 PT Time Calculation (min) (ACUTE ONLY): 21 min  Charges:  $Therapeutic Activity: 8-22 mins                     Alben Deeds, PT DPT  Board Certified Neurologic Specialist [place] Pager [phone] Office [place] 05/07/2018, 9:18 AM

## 2018-05-07 NOTE — TOC Progression Note (Addendum)
Transition of Care [facility]) - Progression Note    Patient Details  Name: Kyle Lowery MRN: [id number] Date of Birth: 1951-01-17  Transition of Care [facility]) CM/SW [place] RN, BSN, NCM-BC, [place] [phone] Phone Number: 05/07/2018, 2:57 PM  Clinical Narrative:  CM continue to follow for dispositional needs/LTACH referral. Covering CM Elenor Quinones [facility], spoke to the attending on 05/04/18 requesting written documentation in Epic as to why the COVID-19 test wasn't performed or why the test is not warranted. No documentation has been completed at this time. CM spoke to the liaison for [facility], with the referral still appropriate, but pending until the requested documentation can be provided in Epic. CM team will continue to follow.   05/07/18 @ 1510-CM spoke to Dr. Halford Chessman with request for COVID documentation discussed. CM attempted x 3 to discuss dispositional needs and LTACH referral with patients spouse with no response; VM left requesting a return call. Attempts also made with patients daughter with no answer. CM team will continue to follow.   Expected Discharge Plan: Long Term Acute Care (LTAC) Barriers to Discharge: Barriers Unresolved (comment), Continued Medical Work up(COVID 19 test or documentation in Epic as to why the test is not warranted )  Expected Discharge Plan and Services Expected Discharge Plan: Long Term Acute Care (LTAC)                                   Social Determinants of Health (SDOH) Interventions    Readmission Risk Interventions No flowsheet data found.

## 2018-05-07 NOTE — TOC Benefit Eligibility Note (Signed)
Transition of Care [facility]) Benefit Eligibility Note    Patient Details  Name: Kyle Lowery MRN: [id number] Date of Birth: [birth year]   Medication/Dose: ELIQUIS  5 MG  BID(ELIQUIS  2.5 MG BID  CO-PAY - $100.00    )  Covered?: Yes  Tier: 3 Drug  Prescription Coverage Preferred Pharmacy: YES([facility])  Spoke with Person/Company/Phone Number:: CRYSTAL (@  [employer] # [phone])  Co-Pay: $ 100.00(50 % OF TOTAL COST WITH A MAX  OF HUNDRED DOLLARS)  Prior Approval: No  Deductible: Met       Memory Argue Phone Number: 05/07/2018, 12:33 PM

## 2018-05-07 NOTE — Progress Notes (Signed)
Nutrition Follow-up  RD working remotely.  DOCUMENTATION CODES:   Non-severe (moderate) malnutrition in context of chronic illness  INTERVENTION:   Tube feeding via Cortrak: - Increase Jevity 1.2 to goal rate of 60 ml/hr (1440 ml/day) - Pro-stat 30 ml daily - free water per MD  Tube feeding regimen provides 1828 kcal, 95 grams of protein, and 1162 ml of H2O (100% of needs).  NUTRITION DIAGNOSIS:   Moderate Malnutrition related to chronic illness (COPD) as evidenced by mild fat depletion, moderate fat depletion, mild muscle depletion, moderate muscle depletion.  Ongoing, being addressed via TF  GOAL:   Patient will meet greater than or equal to 90% of their needs  Met via TF at goal rate  MONITOR:   Vent status, Weight trends, TF tolerance, Labs  REASON FOR ASSESSMENT:   Ventilator, Consult Enteral/tube feeding initiation and management  ASSESSMENT:   68 year old male who presented to the ED on 3/15 with flu-like symptoms. PMH of CAD, COPD, depression, GERD, HTN, HLD. Pt intubated in the ED. Admitted with PNA, flu A positive.  3/17 - extubated 3/18 - reintubated 3/24 - trach 3/25 - NGT placed 3/29 - pt pulled NGT out 3/30 - Cortrak placed, tip post-pyloric per Cortrak team  Pt has had continued difficulties with agitation and weaning to TC during the day. Spoke with RN via phone call who reports pt has been weaning for 4 hours in anticipation of transitioning to TC. Per RN tube feeding currently running at 20 ml/hr with orders to increase rate by 10 ml q 4 hours until goal rate.  Pt is currently on vent support via trach with hopes of transitioning to TC today. Current MV: 13.5 L/min  Current TF order: Jevity 1.2 @ goal rate of 50 ml/hr which provides 1440 kcal, 67 grams of protein, and 968 ml free water. MD approved RD adjusting goal rate to meet pt's nutritional needs.  Weight fairly stable since admission, down 5 lbs. Will continue to monitor weight  trends.  Medications reviewed and include: folic acid, liquid MVI, Protonix, thiamine, IV antibiotics, D5 0.45% NaCl with KCl 20 mEq/L @ 50 ml/hr  Labs reviewed: potassium 3.1 (L) - being replaced, magnesium 1.5 (L) CBG's: 84, 78, 74, 136, 105, 100 x 24 hours  UOP: 1050 ml x 24 hours I/O's: +3.2 L since admit  Diet Order:   Diet Order            Diet NPO time specified  Diet effective midnight              EDUCATION NEEDS:   Not appropriate for education at this time  Skin:  Skin Assessment: Reviewed RN Assessment  Last BM:  05/07/18 large type 7  Height:   Ht Readings from Last 1 Encounters:  04/22/18 '5\' 8"'$  (1.727 m)    Weight:   Wt Readings from Last 1 Encounters:  05/07/18 62.9 kg    Ideal Body Weight:  70 kg  BMI:  Body mass index is 21.08 kg/m.  Estimated Nutritional Needs:   Kcal:  1800-2000  Protein:  95-110 grams  Fluid:  >/= 1.8 L    Gaynell Face, MS, RD, LDN Inpatient Clinical Dietitian Pager: [phone] Weekend/After Hours: [phone]

## 2018-05-07 NOTE — Progress Notes (Signed)
NAME:  Kyle Lowery, MRN:  [id number], DOB:  26-Dec-1950, LOS: 20 ADMISSION DATE:  04/22/2018, CONSULTATION DATE:  04/22/2018 REFERRING MD:  Dr. Roderic Palau, CHIEF COMPLAINT:  Respiratory Distress    History of present illness   68 yo male with cough, fever x 2 weeks.  Presented to [facility] 3/15 with A fib RVR, hypoxia, and pulmonary infiltrates.  Found to have influenza A and Haemophilus.  Past Medical History  CAD, COPD, Depression   Significant Hospital Events   3/15  Presents to [facility] > Transferred to [facility]  3/17  Extubated 3/18  Re-intubated for increased WOB 3/22  Sedation needs / waxing agitated delirium 3/25   Weaning on ATC, episodes of AfibRVR, ongoing agitation, NGT placed   Consults:    Procedures:  ETT 3/15 > 3/17 Trach 3/24 >>  Significant Diagnostic Tests:  3/16 TTE >> EF 55-60%, no wall motion abnormalities, normal RV, mild tricuspid thickening  Micro Data:  Tracheal Asp 3/15 >> Moderate Haemophilus influenzae Flu PCR 3/15 >> FLU A positive  Blood 3/15 >> negative RVP 3/15 >> negative 3/26 Trach asp >> Pseudomonas, Viridans Streptococcus 3/27 BC x2 >>  Antimicrobials:  Cefepime 3/15 >> 3/20 Vancomycin 3/15 >> 3/17 Azithromycin 3/16 >> 3/20 Tamiflu 3/16 >> 3/20 Cefepime 3/27 >>   Interim history/subjective:  C/o chest congestion.  Objective   Blood pressure (!) 159/68, pulse (!) 109, temperature 99.9 F (37.7 C), temperature source Oral, resp. rate (!) 23, height 5\' 8"  (1.727 m), weight 62.9 kg, SpO2 100 %.    Vent Mode: PRVC FiO2 (%):  [35 %-40 %] 40 % Set Rate:  [20 bmp] 20 bmp Vt Set:  [580 mL] 580 mL PEEP:  [5 cmH20] 5 cmH20 Pressure Support:  [15 cmH20] 15 cmH20 Plateau Pressure:  [13 cmH20-20 cmH20] 13 cmH20   Intake/Output Summary (Last 24 hours) at 05/07/2018 2703 Last data filed at 05/07/2018 0700 Gross per 24 hour  Intake 1903.4 ml  Output 700 ml  Net 1203.4 ml   Filed Weights   05/04/18 0359 05/06/18 0500 05/07/18 0600  Weight: 66 kg 64.4  kg 62.9 kg   EXAM  General - alert Eyes - pupils reactive ENT - trach site clean, scab on lips Cardiac - irregular Chest - b/l rhonchi Abdomen - soft, non tender, + bowel sounds Extremities - no cyanosis, clubbing, or edema Skin - no rashes Neuro - follows commands   Resolved Hospital Problem list   Lactic acidosis, Hypotension secondary to sedation, COPD exacerbation, Influenza A PNA, Haemophilus PNA, AKI from ATN, hypernatremia   Assessment & Plan:   Acute hypoxic/hypercapnic respiratory failure from PNA. Failure to wean s/p trach. COPD. Plan - pressure support as able - f/u CXR intermittently - pulmicort, brovana, duoneb - trach care; d/c sutures 4/03  HCAP from Pseudomonas and Viridans Strep. Plan - day 4 of Cefepime  New onset A fib with RVR. Hx of CAD, HTN. Plan - cardiology following - continue eliquis, cardizem, lopressor, pravachol  Hypokalemia, hypomagnesemia. Plan - replace as needed  Delirium. Hx of depression. Plan - paxil, seroquel, klonopin  Moderate protein calorie malnutrition. Plan - cortrak, resume tube feeds  Anemia of critical illness. Plan - f/u CBC - transfuse for Hb < 7   Best practice:  Diet: tube feeds DVT prophylaxis: eliquis GI prophylaxis: protonix Mobility: PT/OT Code Status: full code Family Communication: no family at bedside Disposition: might be good candidate for LTAC  Labs    CMP Latest Ref Rng & Units  05/07/2018 05/06/2018 05/06/2018  Glucose 70 - 99 mg/dL 90 109(H) 113(H)  BUN 8 - 23 mg/dL 15 18 17   Creatinine 0.61 - 1.24 mg/dL 0.72 0.72 0.79  Sodium 135 - 145 mmol/L 140 140 142  Potassium 3.5 - 5.1 mmol/L 3.1(L) 3.1(L) 2.8(L)  Chloride 98 - 111 mmol/L 110 110 109  CO2 22 - 32 mmol/L 24 25 24   Calcium 8.9 - 10.3 mg/dL 7.3(L) 7.2(L) 7.3(L)  Total Protein 6.5 - 8.1 g/dL - - -  Total Bilirubin 0.3 - 1.2 mg/dL - - -  Alkaline Phos 38 - 126 U/L - - -  AST 15 - 41 U/L - - -  ALT 0 - 44 U/L - - -   CBC  Latest Ref Rng & Units 05/06/2018 05/05/2018 05/03/2018  WBC 4.0 - 10.5 K/uL 6.8 8.5 13.5(H)  Hemoglobin 13.0 - 17.0 g/dL 8.3(L) 8.9(L) 9.7(L)  Hematocrit 39.0 - 52.0 % 25.9(L) 28.2(L) 31.4(L)  Platelets 150 - 400 K/uL 184 179 160   ABG    Component Value Date/Time   PHART 7.435 05/01/2018 0407   PCO2ART 43.2 05/01/2018 0407   PO2ART 75.0 (L) 05/01/2018 0407   HCO3 28.9 (H) 05/01/2018 0407   TCO2 30 05/01/2018 0407   ACIDBASEDEF 1.4 04/25/2018 2320   O2SAT 95.0 05/01/2018 0407   CBG (last 3)  Recent Labs    05/06/18 1939 05/06/18 2313 05/07/18 Arthur, MD [place] [facility] 05/07/2018, 9:23 AM

## 2018-05-07 NOTE — Progress Notes (Signed)
SLP Cancellation Note  Patient Details Name: MAHER SHON MRN: [id number] DOB: 1950-12-13   Cancelled treatment:       Reason Eval/Treat Not Completed: Patient not medically ready. Checked in with RN - pt on the vent at the moment but may transition to TC later today. Will f/u as able.   Venita Sheffield Dirk Vanaman 05/07/2018, 8:25 AM  Pollyann Glen, M.A. [place] Acute Environmental education officer [phone] Office [phone]

## 2018-05-08 ENCOUNTER — Inpatient Hospital Stay (HOSPITAL_COMMUNITY): Payer: BLUE CROSS/BLUE SHIELD

## 2018-05-08 LAB — GLUCOSE, CAPILLARY
GLUCOSE-CAPILLARY: 110 mg/dL — AB (ref 70–99)
Glucose-Capillary: 118 mg/dL — ABNORMAL HIGH (ref 70–99)
Glucose-Capillary: 124 mg/dL — ABNORMAL HIGH (ref 70–99)
Glucose-Capillary: 144 mg/dL — ABNORMAL HIGH (ref 70–99)
Glucose-Capillary: 93 mg/dL (ref 70–99)

## 2018-05-08 LAB — BASIC METABOLIC PANEL
Anion gap: 4 — ABNORMAL LOW (ref 5–15)
BUN: 17 mg/dL (ref 8–23)
CO2: 25 mmol/L (ref 22–32)
Calcium: 6.9 mg/dL — ABNORMAL LOW (ref 8.9–10.3)
Chloride: 108 mmol/L (ref 98–111)
Creatinine, Ser: 0.73 mg/dL (ref 0.61–1.24)
GFR calc Af Amer: >60 mL/min (ref >60)
GFR calc non Af Amer: >60 mL/min (ref >60)
Glucose, Bld: 448 mg/dL — ABNORMAL HIGH (ref 70–99)
POTASSIUM: 4.7 mmol/L (ref 3.5–5.1)
Sodium: 137 mmol/L (ref 135–145)

## 2018-05-08 LAB — CBC
HCT: 23.7 % — ABNORMAL LOW (ref 39.0–52.0)
Hemoglobin: 7.1 g/dL — ABNORMAL LOW (ref 13.0–17.0)
MCH: 31.6 pg (ref 26.0–34.0)
MCHC: 30 g/dL (ref 30.0–36.0)
MCV: 105.3 fL — ABNORMAL HIGH (ref 80.0–100.0)
NRBC: 0 % (ref 0.0–0.2)
Platelets: 195 10*3/uL (ref 150–400)
RBC: 2.25 MIL/uL — AB (ref 4.22–5.81)
RDW: 15.4 % (ref 11.5–15.5)
WBC: 5.2 10*3/uL (ref 4.0–10.5)

## 2018-05-08 LAB — MAGNESIUM: Magnesium: 1.8 mg/dL (ref 1.7–2.4)

## 2018-05-08 MED ORDER — SODIUM CHLORIDE 0.9 % IV SOLN: INTRAVENOUS | Status: DC | PRN

## 2018-05-08 MED ORDER — CLONAZEPAM 1 MG PO TABS
1.0000 mg | ORAL_TABLET | Freq: Three times a day (TID) | ORAL | Status: DC
  Administered 2018-05-08 – 2018-05-09 (×4): 1 mg
  Filled 2018-05-08 (×4): qty 1

## 2018-05-08 MED ORDER — DEXMEDETOMIDINE HCL IN NACL 200 MCG/50ML IV SOLN
0.0000 ug/kg/h | INTRAVENOUS | Status: DC
  Administered 2018-05-08: 1 ug/kg/h via INTRAVENOUS
  Administered 2018-05-08: 0.4 ug/kg/h via INTRAVENOUS
  Administered 2018-05-08: 1.2 ug/kg/h via INTRAVENOUS
  Administered 2018-05-09: 0.6 ug/kg/h via INTRAVENOUS
  Filled 2018-05-08 (×5): qty 50

## 2018-05-08 MED ORDER — HALOPERIDOL LACTATE 5 MG/ML IJ SOLN
2.0000 mg | Freq: Once | INTRAMUSCULAR | Status: AC
  Administered 2018-05-08: 2 mg via INTRAVENOUS
  Filled 2018-05-08: qty 1

## 2018-05-08 NOTE — Evaluation (Signed)
Occupational Therapy Evaluation Patient Details Name: Kyle Lowery MRN: [id number] DOB: 26-May-1950 Today's Date: 05/08/2018    History of Present Illness Pt transferred from [facility] with Acute hypercarbic and hypoxemic respiratory failure and copd exacerbation due to flu+ and PNA. Pt intubated 3/15 -3/18 and reintubated 3/19. Pt trached on 3/24.    Clinical Impression   Pt admitted for above, limited by problem list below. Currently requires mod assist +2 for basic transfers, mod assist for UB ADLs, and mod assist for LB ADLs.  Requires constant redirection for safety (pulling at lines), impulsive with poor awareness to deficits and safety.  Patient will benefit from continued OT services while admitted in order to address safety, cognition, generalized weakness, decreased activity tolerance, and balance. Recommend continued services aft LTACH after dc.  Will follow.     Follow Up Recommendations  LTACH;Supervision/Assistance - 24 hour    Equipment Recommendations  Other (comment)(TBD at next venue of care)    Recommendations for Other Services       Precautions / Restrictions Precautions Precautions: Fall;Other (comment) Precaution Comments: trach, vent Restrictions Weight Bearing Restrictions: No      Mobility Bed Mobility Overal bed mobility: Needs Assistance Bed Mobility: Supine to Sit     Supine to sit: Min assist;+2 for safety/equipment     General bed mobility comments: Min assist for positioning to EOB  Transfers Overall transfer level: Needs assistance Equipment used: 1 person hand held assist;Rolling walker (2 wheeled) Transfers: Sit to/from [facility] Sit to Stand: Min assist;+2 physical assistance Stand pivot transfers: Mod assist;+2 physical assistance       General transfer comment: Min assist for elevation to upright from bed, moderate assist during transition. Increased time for safety due to impulsivity. Also performed min  assist with RW for in room ambulation attempts    Balance Overall balance assessment: Needs assistance Sitting-balance support: Bilateral upper extremity supported;Feet supported Sitting balance-Leahy Scale: Fair Sitting balance - Comments: Min guard to min assist at EOB, patient with noted distractiblility from lines   Standing balance support: Bilateral upper extremity supported Standing balance-Leahy Scale: Poor Standing balance comment: reliance on external assist in upright                           ADL either performed or assessed with clinical judgement   ADL Overall ADL's : Needs assistance/impaired     Grooming: Minimal assistance;Sitting   Upper Body Bathing: Minimal assistance;Sitting   Lower Body Bathing: Minimal assistance;Sit to/from stand;+2 for safety/equipment   Upper Body Dressing : Sitting;Moderate assistance   Lower Body Dressing: Moderate assistance;+2 for physical assistance;+2 for safety/equipment;Sit to/from stand   Toilet Transfer: Moderate assistance;+2 for physical assistance;+2 for safety/equipment;Ambulation Toilet Transfer Details (indicate cue type and reason): simulated to recliner Toileting- Clothing Manipulation and Hygiene: Total assistance;+2 for physical assistance;Sit to/from stand       Functional mobility during ADLs: Moderate assistance;+2 for safety/equipment       Vision   Additional Comments: appears [facility]     Perception     Praxis      Pertinent Vitals/Pain Pain Assessment: Faces Faces Pain Scale: No hurt     Hand Dominance     Extremity/Trunk Assessment Upper Extremity Assessment Upper Extremity Assessment: Generalized weakness   Lower Extremity Assessment Lower Extremity Assessment: Defer to PT evaluation       Communication Communication Communication: Tracheostomy   Cognition Arousal/Alertness: Awake/alert Behavior During Therapy: Restless;Impulsive Overall Cognitive  Status: Impaired/Different  from baseline Area of Impairment: Attention;Following commands;Safety/judgement;Problem solving;Awareness                   Current Attention Level: Sustained   Following Commands: Follows one step commands with increased time Safety/Judgement: Decreased awareness of safety Awareness: Intellectual Problem Solving: Slow processing;Requires verbal cues;Requires tactile cues General Comments: impulsive, but easily redirected   General Comments  VSS on vent via trach    Exercises     Shoulder Instructions      Home Living                                   Additional Comments: Pt unable to state      Prior Functioning/Environment Level of Independence: Independent        Comments: Not confirmed but assumed        OT Problem List: Decreased strength;Decreased activity tolerance;Impaired balance (sitting and/or standing);Decreased safety awareness;Decreased cognition;Decreased knowledge of use of DME or AE;Decreased knowledge of precautions      OT Treatment/Interventions: Self-care/ADL training;Therapeutic exercise;DME and/or AE instruction;Therapeutic activities;Balance training;Patient/family education;Cognitive remediation/compensation;Energy conservation    OT Goals(Current goals can be found in the care plan section) Acute Rehab OT Goals Patient Stated Goal: unable to state Time For Goal Achievement: 05/22/18 Potential to Achieve Goals: Good  OT Frequency: Min 2X/week   Barriers to D/C:            Co-evaluation PT/OT/SLP Co-Evaluation/Treatment: Yes Reason for Co-Treatment: Complexity of the patient's impairments (multi-system involvement) PT goals addressed during session: Mobility/safety with mobility OT goals addressed during session: ADL's and self-care      AM-PAC OT "6 Clicks" Daily Activity     Outcome Measure Help from another person eating meals?: Total Help from another person taking care of personal grooming?: A Little Help  from another person toileting, which includes using toliet, bedpan, or urinal?: Total Help from another person bathing (including washing, rinsing, drying)?: A Lot Help from another person to put on and taking off regular upper body clothing?: A Lot Help from another person to put on and taking off regular lower body clothing?: A Lot 6 Click Score: 11   End of Session Equipment Utilized During Treatment: Rolling walker Nurse Communication: Mobility status;Precautions  Activity Tolerance: Patient tolerated treatment well Patient left: in chair;with call bell/phone within reach;with chair alarm set;with restraints reapplied  OT Visit Diagnosis: Other abnormalities of gait and mobility (R26.89);Muscle weakness (generalized) (M62.81);Other symptoms and signs involving cognitive function                Time: 1100-1134 OT Time Calculation (min): 34 min Charges:  OT General Charges $OT Visit: 1 Visit OT Evaluation $OT Eval High Complexity: 1 High  Delight Stare, OT Acute Rehabilitation Services Pager [phone] Office [phone]    Delight Stare 05/08/2018, 1:20 PM

## 2018-05-08 NOTE — Progress Notes (Signed)
NAME:  FREDDERICK SWANGER, MRN:  [id number], DOB:  [birth year], LOS: 27 ADMISSION DATE:  04/22/2018, CONSULTATION DATE:  04/22/2018 REFERRING MD:  Dr. Roderic Palau, CHIEF COMPLAINT:  Respiratory Distress    History of present illness   68 yo male with cough, fever x 2 weeks.  Presented to [facility] 3/15 with A fib RVR, hypoxia, and pulmonary infiltrates.  Found to have influenza A and Haemophilus.  Past Medical History  CAD, COPD, Depression   Significant Hospital Events   3/15  Presents to [facility] > Transferred to [facility]  3/17  Extubated 3/18  Re-intubated for increased WOB 3/22  Sedation needs / waxing agitated delirium 3/25   Weaning on ATC, episodes of AfibRVR, ongoing agitation, NGT placed   Consults:    Procedures:  ETT 3/15 > 3/17 Trach 3/24 >>  Significant Diagnostic Tests:  3/16 TTE >> EF 55-60%, no wall motion abnormalities, normal RV, mild tricuspid thickening  Micro Data:  Tracheal Asp 3/15 >> Moderate Haemophilus influenzae Flu PCR 3/15 >> FLU A positive  Blood 3/15 >> negative RVP 3/15 >> negative 3/26 Trach asp >> Pseudomonas, Viridans Streptococcus 3/27 BC x2 >>  Antimicrobials:  Cefepime 3/15 >> 3/20 Vancomycin 3/15 >> 3/17 Azithromycin 3/16 >> 3/20 Tamiflu 3/16 >> 3/20 Cefepime 3/27 >>   Interim history/subjective:  Agitated overnight.  Objective   Blood pressure (!) 142/72, pulse 84, temperature 99 F (37.2 C), temperature source Oral, resp. rate (!) 26, height 5\' 8"  (1.727 m), weight 63.7 kg, SpO2 100 %.    Vent Mode: PSV;CPAP FiO2 (%):  [40 %] 40 % Set Rate:  [20 bmp] 20 bmp Vt Set:  [580 mL] 580 mL PEEP:  [5 cmH20] 5 cmH20 Pressure Support:  [15 cmH20] 15 cmH20 Plateau Pressure:  [14 cmH20-20 cmH20] 14 cmH20   Intake/Output Summary (Last 24 hours) at 05/08/2018 0935 Last data filed at 05/08/2018 0800 Gross per 24 hour  Intake 2237.79 ml  Output 860 ml  Net 1377.79 ml   Filed Weights   05/06/18 0500 05/07/18 0600 05/08/18 0404  Weight: 64.4 kg 62.9 kg  63.7 kg   EXAM  General - alert Eyes - pupils reactive ENT - trach site clean Cardiac - regular Chest - scattered rhonchi Abdomen - soft, non tender, + bowel sounds Extremities - no cyanosis, clubbing, or edema Skin - no rashes Neuro - follows simple commands  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 Failure to wean s/p trach. COPD. Plan - pressure support as able - trach care - d/c trach sutures 4/03 - pulmicort, brovana, duoneb  HCAP from Pseudomonas and Viridans Strep. Plan - day 5 of ABx, on cefepime  Paroxysmal A fib - now in sinus. Hx of CAD, HTN, HLD. Plan - cardiology following - continue eliquis, cardizem, lopressor, pravachol  Hypokalemia, hypomagnesemia. Plan - replace as needed  Delirium. Hx of depression. Plan - continue paxil, seroquel - change klonopin to 1 mg q8h  Moderate protein calorie malnutrition. Plan - tube feeds  Anemia of critical illness >> no evidence for bleeding. Plan - f/u CBC - transfuse for Hb < 7 - check iron levels  Deconditioning. Plan - PT/OT  Best practice:  Diet: tube feeds DVT prophylaxis: eliquis GI prophylaxis: protonix Mobility: PT/OT Code Status: full code Disposition:  might be good candidate for LTAC  Will ask [facility] to assume primary care and PCCM will follow for trach/vent management.  Labs    CMP Latest Ref Rng & Units 05/08/2018 05/07/2018 05/06/2018  Glucose 70 - 99 mg/dL 448(H) 90 109(H)  BUN 8 - 23 mg/dL 17 15 18   Creatinine 0.61 - 1.24 mg/dL 0.73 0.72 0.72  Sodium 135 - 145 mmol/L 137 140 140  Potassium 3.5 - 5.1 mmol/L 4.7 3.1(L) 3.1(L)  Chloride 98 - 111 mmol/L 108 110 110  CO2 22 - 32 mmol/L 25 24 25   Calcium 8.9 - 10.3 mg/dL 6.9(L) 7.3(L) 7.2(L)   Total Protein 6.5 - 8.1 g/dL - - -  Total Bilirubin 0.3 - 1.2 mg/dL - - -  Alkaline Phos 38 - 126 U/L - - -  AST 15 - 41 U/L - - -  ALT 0 - 44 U/L - - -   CBC Latest Ref Rng & Units 05/08/2018 05/06/2018 05/05/2018  WBC 4.0 - 10.5 K/uL 5.2 6.8 8.5  Hemoglobin 13.0 - 17.0 g/dL 7.1(L) 8.3(L) 8.9(L)  Hematocrit 39.0 - 52.0 % 23.7(L) 25.9(L) 28.2(L)  Platelets 150 - 400 K/uL 195 184 179   ABG    Component Value Date/Time   PHART 7.435 05/01/2018 0407   PCO2ART 43.2 05/01/2018 0407   PO2ART 75.0 (L) 05/01/2018 0407   HCO3 28.9 (H) 05/01/2018 0407   TCO2 30 05/01/2018 0407   ACIDBASEDEF 1.4 04/25/2018 2320   O2SAT 95.0 05/01/2018 0407   CBG (last 3)  Recent Labs    05/07/18 2004 05/07/18 2346 05/08/18 0422  GLUCAP 108* 144* 15*    Chesley Mires, MD [facility] Pulmonary/Critical Care 05/08/2018, 9:35 AM

## 2018-05-08 NOTE — Progress Notes (Signed)
[place] Progress Note Patient Name: Kyle Lowery DOB: [birth year] MRN: [id number]   Date of Service  05/08/2018  HPI/Events of Note  Severe agitation - Request for sedation and restraints.   eICU Interventions  Will order: 1. Haldol 2 mg IV now.  2. Bilateral soft wrist restraints.      Intervention Category Major Interventions: Delirium, psychosis, severe agitation - evaluation and management  Roscoe Witts [place] 05/08/2018, 3:24 AM

## 2018-05-08 NOTE — Progress Notes (Signed)
[place] Progress Note Patient Name: Kyle Lowery DOB: 1950/09/21 MRN: [id number]   Date of Service  05/08/2018  HPI/Events of Note  Delirium - Patient is trying to get out of bed. QTc = 0.49.  eICU Interventions  Will order: 1. Haldol 2 mg IV X 1 now.      Intervention Category Major Interventions: Delirium, psychosis, severe agitation - evaluation and management  Sommer,Steven Eugene 05/08/2018, 12:25 AM

## 2018-05-08 NOTE — Progress Notes (Signed)
Physical Therapy Treatment Patient Details Name: Kyle Lowery MRN: [id number] DOB: 03-14-1950 Today's Date: 05/08/2018    History of Present Illness Pt transferred from [facility] with Acute hypercarbic and hypoxemic respiratory failure and copd exacerbation due to flu+ and PNA. Pt intubated 3/15 -3/18 and reintubated 3/19. Pt trached on 3/24.     PT Comments    Patient seen for activity progression with OT. Patient on vent support but weaning during session. VSS on vent. Patient tolerated OOB and was able to take a series of ambulatory steps in room multiple times with assist. Patient remains impulsive and restless throughout session. Current POC remains appropriate.   Follow Up Recommendations  LTACH     Equipment Recommendations  Other (comment)(To be determined)    Recommendations for Other Services OT consult     Precautions / Restrictions Precautions Precautions: Fall;Other (comment) Precaution Comments: trach, vent Restrictions Weight Bearing Restrictions: No    Mobility  Bed Mobility Overal bed mobility: Needs Assistance Bed Mobility: Supine to Sit     Supine to sit: Min assist;+2 for safety/equipment     General bed mobility comments: Min assist for positioning to EOB  Transfers Overall transfer level: Needs assistance Equipment used: 1 person hand held assist;Rolling walker (2 wheeled) Transfers: Sit to/from [facility] Sit to Stand: Min assist;+2 physical assistance Stand pivot transfers: Mod assist;+2 physical assistance       General transfer comment: Min assist for elevation to upright from bed, moderate assist during transition. Increased time for safety due to impulsivity. Also performed min assist with RW for in room ambulation attempts  Ambulation/Gait Ambulation/Gait assistance: Min assist;+2 physical assistance;+2 safety/equipment Gait Distance (Feet): 7 Feet(x2 fwd and back) Assistive device: Rolling walker (2  wheeled) Gait Pattern/deviations: Step-to pattern;Shuffle Gait velocity: decreased   General Gait Details: Min assist for safety and stability   Stairs             Wheelchair Mobility    Modified Rankin (Stroke Patients Only)       Balance Overall balance assessment: Needs assistance Sitting-balance support: Bilateral upper extremity supported;Feet supported Sitting balance-Leahy Scale: Fair Sitting balance - Comments: Min guard to min assist at EOB, patient with noted distractiblility from lines   Standing balance support: Bilateral upper extremity supported Standing balance-Leahy Scale: Poor Standing balance comment: reliance on external assist in upright                            Cognition Arousal/Alertness: Awake/alert Behavior During Therapy: Restless;Impulsive Overall Cognitive Status: Impaired/Different from baseline Area of Impairment: Attention;Following commands;Safety/judgement;Problem solving;Awareness                   Current Attention Level: Sustained   Following Commands: Follows one step commands with increased time Safety/Judgement: Decreased awareness of safety Awareness: Intellectual Problem Solving: Slow processing;Requires verbal cues;Requires tactile cues General Comments: improved ability to follow commands, remains impulsive      Exercises      General Comments        Pertinent Vitals/Pain Pain Assessment: Faces Faces Pain Scale: No hurt    Home Living                 Additional Comments: Pt unable to state    Prior Function Level of Independence: Independent      Comments: Not confirmed but assumed   PT Goals (current goals can now be found in the care plan section) Acute  Rehab PT Goals Patient Stated Goal: unable to state PT Goal Formulation: Patient unable to participate in goal setting Time For Goal Achievement: 05/17/18 Potential to Achieve Goals: Fair Progress towards PT goals:  Progressing toward goals    Frequency    Min 2X/week      PT Plan Current plan remains appropriate    Co-evaluation PT/OT/SLP Co-Evaluation/Treatment: Yes Reason for Co-Treatment: Complexity of the patient's impairments (multi-system involvement) PT goals addressed during session: Mobility/safety with mobility OT goals addressed during session: ADL's and self-care      AM-PAC PT "6 Clicks" Mobility   Outcome Measure  Help needed turning from your back to your side while in a flat bed without using bedrails?: A Lot Help needed moving from lying on your back to sitting on the side of a flat bed without using bedrails?: A Lot Help needed moving to and from a bed to a chair (including a wheelchair)?: A Lot Help needed standing up from a chair using your arms (e.g., wheelchair or bedside chair)?: A Lot Help needed to walk in hospital room?: Total Help needed climbing 3-5 steps with a railing? : Total 6 Click Score: 10    End of Session Equipment Utilized During Treatment: Oxygen(vent) Activity Tolerance: Patient limited by fatigue Patient left: in chair;with call bell/phone within reach;with chair alarm set;with nursing/sitter in room Nurse Communication: Mobility status;Need for lift equipment PT Visit Diagnosis: Other abnormalities of gait and mobility (R26.89);Muscle weakness (generalized) (M62.81)     Time: 7591-6384 PT Time Calculation (min) (ACUTE ONLY): 31 min  Charges:  $Therapeutic Activity: 8-22 mins                     Alben Deeds, PT DPT  Board Certified Neurologic Specialist [place] Pager [phone] Office [place] 05/08/2018, 11:57 AM

## 2018-05-08 NOTE — Significant Event (Signed)
Patient constantly trying to get out of the bed and pulling off lines and vent tube, even with staff in room with him. Bed alarm, bilateral wrist restraints and mittens are on to prevent patient from pulling tracheostomy and PICC. Patient still manages to pull out condom catheters, EKG leads and trach dressings and pulling off ventilator tubes. Patient has gotten to edge of bed multiple times-unable to safely keep patient in bed. Called to Dr. Halford Chessman. New orders received.     Kyle Lowery

## 2018-05-08 NOTE — Progress Notes (Signed)
Tried calling pt's wife at listed number, but routed to voicemail.  Chesley Mires, MD [facility] Pulmonary/Critical Care 05/08/2018, 11:27 AM

## 2018-05-08 NOTE — Significant Event (Signed)
Patient became acutely combative at 1440, climbing out of bed, kept pulling out the ventilator tube, and swinging against staff. Patient voicing out "let me go home, let me walk." Took four RNs to assist patient back to bed safely. Dr. Halford Chessman notified in the unit.  Kyle Lowery

## 2018-05-09 ENCOUNTER — Other Ambulatory Visit (HOSPITAL_COMMUNITY): Payer: BLUE CROSS/BLUE SHIELD

## 2018-05-09 ENCOUNTER — Inpatient Hospital Stay
Admission: AD | Admit: 2018-05-09 | Discharge: 2018-05-30 | Disposition: A | Discharge status: Rehabilitation Facility | Payer: BLUE CROSS/BLUE SHIELD | Source: Other Acute Inpatient Hospital | Attending: Internal Medicine | Admitting: Internal Medicine

## 2018-05-09 DIAGNOSIS — R1312 Dysphagia, oropharyngeal phase: Secondary | ICD-10-CM | POA: Diagnosis not present

## 2018-05-09 DIAGNOSIS — I10 Essential (primary) hypertension: Secondary | ICD-10-CM | POA: Diagnosis not present

## 2018-05-09 DIAGNOSIS — J9622 Acute and chronic respiratory failure with hypercapnia: Secondary | ICD-10-CM | POA: Diagnosis present

## 2018-05-09 DIAGNOSIS — I480 Paroxysmal atrial fibrillation: Secondary | ICD-10-CM | POA: Diagnosis not present

## 2018-05-09 DIAGNOSIS — G9341 Metabolic encephalopathy: Secondary | ICD-10-CM | POA: Diagnosis not present

## 2018-05-09 DIAGNOSIS — J9690 Respiratory failure, unspecified, unspecified whether with hypoxia or hypercapnia: Secondary | ICD-10-CM

## 2018-05-09 DIAGNOSIS — R5381 Other malaise: Secondary | ICD-10-CM | POA: Diagnosis not present

## 2018-05-09 DIAGNOSIS — Z7951 Long term (current) use of inhaled steroids: Secondary | ICD-10-CM | POA: Diagnosis not present

## 2018-05-09 DIAGNOSIS — Z6821 Body mass index (BMI) 21.0-21.9, adult: Secondary | ICD-10-CM | POA: Diagnosis not present

## 2018-05-09 DIAGNOSIS — F329 Major depressive disorder, single episode, unspecified: Secondary | ICD-10-CM | POA: Diagnosis present

## 2018-05-09 DIAGNOSIS — T85598A Other mechanical complication of other gastrointestinal prosthetic devices, implants and grafts, initial encounter: Secondary | ICD-10-CM

## 2018-05-09 DIAGNOSIS — J09X1 Influenza due to identified novel influenza A virus with pneumonia: Secondary | ICD-10-CM | POA: Diagnosis present

## 2018-05-09 DIAGNOSIS — D62 Acute posthemorrhagic anemia: Secondary | ICD-10-CM | POA: Diagnosis not present

## 2018-05-09 DIAGNOSIS — E440 Moderate protein-calorie malnutrition: Secondary | ICD-10-CM | POA: Diagnosis present

## 2018-05-09 DIAGNOSIS — J9602 Acute respiratory failure with hypercapnia: Secondary | ICD-10-CM | POA: Diagnosis not present

## 2018-05-09 DIAGNOSIS — F17200 Nicotine dependence, unspecified, uncomplicated: Secondary | ICD-10-CM | POA: Diagnosis present

## 2018-05-09 DIAGNOSIS — Z818 Family history of other mental and behavioral disorders: Secondary | ICD-10-CM | POA: Diagnosis not present

## 2018-05-09 DIAGNOSIS — R339 Retention of urine, unspecified: Secondary | ICD-10-CM | POA: Diagnosis not present

## 2018-05-09 DIAGNOSIS — Z9079 Acquired absence of other genital organ(s): Secondary | ICD-10-CM | POA: Diagnosis not present

## 2018-05-09 DIAGNOSIS — J9621 Acute and chronic respiratory failure with hypoxia: Secondary | ICD-10-CM | POA: Diagnosis present

## 2018-05-09 DIAGNOSIS — R918 Other nonspecific abnormal finding of lung field: Secondary | ICD-10-CM | POA: Diagnosis not present

## 2018-05-09 DIAGNOSIS — J398 Other specified diseases of upper respiratory tract: Secondary | ICD-10-CM

## 2018-05-09 DIAGNOSIS — J449 Chronic obstructive pulmonary disease, unspecified: Secondary | ICD-10-CM | POA: Diagnosis present

## 2018-05-09 DIAGNOSIS — F17210 Nicotine dependence, cigarettes, uncomplicated: Secondary | ICD-10-CM | POA: Diagnosis not present

## 2018-05-09 DIAGNOSIS — Z915 Personal history of self-harm: Secondary | ICD-10-CM | POA: Diagnosis not present

## 2018-05-09 DIAGNOSIS — K224 Dyskinesia of esophagus: Secondary | ICD-10-CM | POA: Diagnosis not present

## 2018-05-09 DIAGNOSIS — J811 Chronic pulmonary edema: Secondary | ICD-10-CM

## 2018-05-09 DIAGNOSIS — R451 Restlessness and agitation: Secondary | ICD-10-CM | POA: Diagnosis not present

## 2018-05-09 DIAGNOSIS — J9601 Acute respiratory failure with hypoxia: Secondary | ICD-10-CM | POA: Diagnosis not present

## 2018-05-09 DIAGNOSIS — Z7901 Long term (current) use of anticoagulants: Secondary | ICD-10-CM | POA: Diagnosis not present

## 2018-05-09 DIAGNOSIS — I2510 Atherosclerotic heart disease of native coronary artery without angina pectoris: Secondary | ICD-10-CM | POA: Diagnosis not present

## 2018-05-09 DIAGNOSIS — R197 Diarrhea, unspecified: Secondary | ICD-10-CM | POA: Diagnosis not present

## 2018-05-09 DIAGNOSIS — Z8349 Family history of other endocrine, nutritional and metabolic diseases: Secondary | ICD-10-CM | POA: Diagnosis not present

## 2018-05-09 DIAGNOSIS — Z8249 Family history of ischemic heart disease and other diseases of the circulatory system: Secondary | ICD-10-CM | POA: Diagnosis not present

## 2018-05-09 DIAGNOSIS — Z4659 Encounter for fitting and adjustment of other gastrointestinal appliance and device: Secondary | ICD-10-CM | POA: Diagnosis not present

## 2018-05-09 DIAGNOSIS — R0602 Shortness of breath: Secondary | ICD-10-CM

## 2018-05-09 DIAGNOSIS — Z430 Encounter for attention to tracheostomy: Secondary | ICD-10-CM | POA: Diagnosis not present

## 2018-05-09 DIAGNOSIS — E785 Hyperlipidemia, unspecified: Secondary | ICD-10-CM | POA: Diagnosis present

## 2018-05-09 DIAGNOSIS — R531 Weakness: Secondary | ICD-10-CM | POA: Diagnosis present

## 2018-05-09 DIAGNOSIS — L89626 Pressure-induced deep tissue damage of left heel: Secondary | ICD-10-CM | POA: Diagnosis present

## 2018-05-09 DIAGNOSIS — E782 Mixed hyperlipidemia: Secondary | ICD-10-CM | POA: Diagnosis not present

## 2018-05-09 DIAGNOSIS — Z79899 Other long term (current) drug therapy: Secondary | ICD-10-CM | POA: Diagnosis not present

## 2018-05-09 DIAGNOSIS — K219 Gastro-esophageal reflux disease without esophagitis: Secondary | ICD-10-CM | POA: Diagnosis present

## 2018-05-09 DIAGNOSIS — D638 Anemia in other chronic diseases classified elsewhere: Secondary | ICD-10-CM | POA: Diagnosis present

## 2018-05-09 DIAGNOSIS — D649 Anemia, unspecified: Secondary | ICD-10-CM

## 2018-05-09 DIAGNOSIS — J151 Pneumonia due to Pseudomonas: Secondary | ICD-10-CM | POA: Diagnosis present

## 2018-05-09 DIAGNOSIS — Z9911 Dependence on respirator [ventilator] status: Secondary | ICD-10-CM | POA: Diagnosis not present

## 2018-05-09 DIAGNOSIS — R1310 Dysphagia, unspecified: Secondary | ICD-10-CM | POA: Diagnosis not present

## 2018-05-09 DIAGNOSIS — E876 Hypokalemia: Secondary | ICD-10-CM | POA: Diagnosis not present

## 2018-05-09 DIAGNOSIS — J984 Other disorders of lung: Secondary | ICD-10-CM | POA: Diagnosis not present

## 2018-05-09 DIAGNOSIS — R410 Disorientation, unspecified: Secondary | ICD-10-CM | POA: Diagnosis not present

## 2018-05-09 DIAGNOSIS — R0989 Other specified symptoms and signs involving the circulatory and respiratory systems: Secondary | ICD-10-CM | POA: Diagnosis not present

## 2018-05-09 DIAGNOSIS — I482 Chronic atrial fibrillation, unspecified: Secondary | ICD-10-CM | POA: Diagnosis present

## 2018-05-09 DIAGNOSIS — Z931 Gastrostomy status: Secondary | ICD-10-CM

## 2018-05-09 DIAGNOSIS — D509 Iron deficiency anemia, unspecified: Secondary | ICD-10-CM | POA: Diagnosis not present

## 2018-05-09 DIAGNOSIS — Z930 Tracheostomy status: Secondary | ICD-10-CM | POA: Diagnosis not present

## 2018-05-09 DIAGNOSIS — J440 Chronic obstructive pulmonary disease with acute lower respiratory infection: Secondary | ICD-10-CM | POA: Diagnosis present

## 2018-05-09 HISTORY — DX: Chronic obstructive pulmonary disease, unspecified: J44.9

## 2018-05-09 HISTORY — DX: Paroxysmal atrial fibrillation: I48.0

## 2018-05-09 HISTORY — DX: Disorientation, unspecified: R41.0

## 2018-05-09 HISTORY — DX: Pneumonia due to Pseudomonas: J15.1

## 2018-05-09 HISTORY — DX: Acute and chronic respiratory failure with hypoxia: J96.21

## 2018-05-09 LAB — BASIC METABOLIC PANEL
Anion gap: 7 (ref 5–15)
BUN: 19 mg/dL (ref 8–23)
CALCIUM: 7.5 mg/dL — AB (ref 8.9–10.3)
CO2: 25 mmol/L (ref 22–32)
Chloride: 109 mmol/L (ref 98–111)
Creatinine, Ser: 0.8 mg/dL (ref 0.61–1.24)
GFR calc Af Amer: >60 mL/min (ref >60)
GFR calc non Af Amer: >60 mL/min (ref >60)
Glucose, Bld: 131 mg/dL — ABNORMAL HIGH (ref 70–99)
Potassium: 3.3 mmol/L — ABNORMAL LOW (ref 3.5–5.1)
Sodium: 141 mmol/L (ref 135–145)

## 2018-05-09 LAB — CBC
HCT: 21.7 % — ABNORMAL LOW (ref 39.0–52.0)
Hemoglobin: 6.8 g/dL — CL (ref 13.0–17.0)
MCH: 32.9 pg (ref 26.0–34.0)
MCHC: 31.3 g/dL (ref 30.0–36.0)
MCV: 104.8 fL — ABNORMAL HIGH (ref 80.0–100.0)
Platelets: 177 10*3/uL (ref 150–400)
RBC: 2.07 MIL/uL — ABNORMAL LOW (ref 4.22–5.81)
RDW: 15.5 % (ref 11.5–15.5)
WBC: 4.9 10*3/uL (ref 4.0–10.5)
nRBC: 0.4 % — ABNORMAL HIGH (ref 0.0–0.2)

## 2018-05-09 LAB — CULTURE, BLOOD (ROUTINE X 2)
Culture: NO GROWTH
Culture: NO GROWTH
Special Requests: ADEQUATE
Special Requests: ADEQUATE

## 2018-05-09 LAB — GLUCOSE, CAPILLARY
Glucose-Capillary: 109 mg/dL — ABNORMAL HIGH (ref 70–99)
Glucose-Capillary: 116 mg/dL — ABNORMAL HIGH (ref 70–99)
Glucose-Capillary: 143 mg/dL — ABNORMAL HIGH (ref 70–99)
Glucose-Capillary: 122 mg/dL — ABNORMAL HIGH (ref 70–99)
Glucose-Capillary: 107 mg/dL — ABNORMAL HIGH (ref 70–99)
Glucose-Capillary: 86 mg/dL (ref 70–99)

## 2018-05-09 LAB — IRON AND TIBC
Iron: 32 ug/dL — ABNORMAL LOW (ref 45–182)
Saturation Ratios: 30 % (ref 17.9–39.5)
TIBC: 106 ug/dL — ABNORMAL LOW (ref 250–450)
UIBC: 74 ug/dL

## 2018-05-09 LAB — MAGNESIUM: MAGNESIUM: 1.8 mg/dL (ref 1.7–2.4)

## 2018-05-09 LAB — FERRITIN: Ferritin: 27 ng/mL (ref 24–336)

## 2018-05-09 LAB — PREPARE RBC (CROSSMATCH)

## 2018-05-09 MED ORDER — VITAMIN C 500 MG PO TABS
250.0000 mg | ORAL_TABLET | Freq: Two times a day (BID) | ORAL | Status: DC
  Administered 2018-05-09: 11:00:00 250 mg
  Filled 2018-05-09: qty 1

## 2018-05-09 MED ORDER — MAGNESIUM SULFATE 2 GM/50ML IV SOLN
2.0000 g | Freq: Once | INTRAVENOUS | Status: AC
  Administered 2018-05-09: 11:00:00 2 g via INTRAVENOUS
  Filled 2018-05-09: qty 50

## 2018-05-09 MED ORDER — THIAMINE HCL 100 MG PO TABS: 100.0000 mg | ORAL_TABLET | Freq: Every day | ORAL | Status: DC

## 2018-05-09 MED ORDER — POTASSIUM CHLORIDE 20 MEQ/15ML (10%) PO SOLN
20.0000 meq | ORAL | Status: AC
  Administered 2018-05-09 (×2): 20 meq
  Filled 2018-05-09 (×2): qty 15

## 2018-05-09 MED ORDER — DILTIAZEM 12 MG/ML ORAL SUSPENSION: 90.0000 mg | Freq: Three times a day (TID) | ORAL | Status: DC

## 2018-05-09 MED ORDER — QUETIAPINE FUMARATE 50 MG PO TABS: 50.0000 mg | ORAL_TABLET | Freq: Every day | ORAL | Status: DC

## 2018-05-09 MED ORDER — SODIUM CHLORIDE 0.9% IV SOLUTION: Freq: Once | INTRAVENOUS | Status: DC

## 2018-05-09 MED ORDER — PRO-STAT SUGAR FREE PO LIQD: 30.0000 mL | Freq: Every day | ORAL | 0 refills | Status: DC

## 2018-05-09 MED ORDER — ASCORBIC ACID 250 MG PO TABS: 250.0000 mg | ORAL_TABLET | Freq: Two times a day (BID) | ORAL | Status: DC

## 2018-05-09 MED ORDER — BUDESONIDE 0.5 MG/2ML IN SUSP: 0.5000 mg | Freq: Two times a day (BID) | RESPIRATORY_TRACT | 12 refills | Status: DC

## 2018-05-09 MED ORDER — CLONAZEPAM 1 MG PO TABS: 1.0000 mg | ORAL_TABLET | Freq: Three times a day (TID) | ORAL | 0 refills | Status: DC

## 2018-05-09 MED ORDER — PAROXETINE HCL 20 MG PO TABS: 20.0000 mg | ORAL_TABLET | Freq: Every day | ORAL | Status: DC

## 2018-05-09 MED ORDER — APIXABAN 5 MG PO TABS: 5.0000 mg | ORAL_TABLET | Freq: Two times a day (BID) | ORAL | Status: DC

## 2018-05-09 MED ORDER — METOPROLOL TARTRATE 25 MG/10 ML ORAL SUSPENSION: 50.0000 mg | Freq: Three times a day (TID) | ORAL | 0 refills | Status: DC

## 2018-05-09 MED ORDER — BISACODYL 10 MG RE SUPP: 10.0000 mg | Freq: Every day | RECTAL | 0 refills | Status: DC | PRN

## 2018-05-09 MED ORDER — FOLIC ACID 1 MG PO TABS: 1.0000 mg | ORAL_TABLET | Freq: Every day | ORAL | Status: DC

## 2018-05-09 MED ORDER — IPRATROPIUM-ALBUTEROL 0.5-2.5 (3) MG/3ML IN SOLN: 3.0000 mL | Freq: Four times a day (QID) | RESPIRATORY_TRACT | Status: DC

## 2018-05-09 MED ORDER — FERROUS SULFATE 220 (44 FE) MG/5ML PO ELIX
220.0000 mg | ORAL_SOLUTION | Freq: Two times a day (BID) | ORAL | Status: DC
  Filled 2018-05-09: qty 5

## 2018-05-09 MED ORDER — VITAMIN C 500 MG/5ML PO SYRP: 250.0000 mg | ORAL_SOLUTION | Freq: Two times a day (BID) | ORAL | Status: DC

## 2018-05-09 MED ORDER — ARFORMOTEROL TARTRATE 15 MCG/2ML IN NEBU: 15.0000 ug | INHALATION_SOLUTION | Freq: Two times a day (BID) | RESPIRATORY_TRACT | Status: DC

## 2018-05-09 MED ORDER — FERROUS SULFATE 220 (44 FE) MG/5ML PO ELIX: 220.0000 mg | ORAL_SOLUTION | Freq: Two times a day (BID) | ORAL | 3 refills | Status: DC

## 2018-05-09 MED ORDER — DOCUSATE SODIUM 50 MG/5ML PO LIQD: 100.0000 mg | Freq: Two times a day (BID) | ORAL | 0 refills | Status: DC | PRN

## 2018-05-09 MED ORDER — PANTOPRAZOLE SODIUM 40 MG PO PACK: 40.0000 mg | PACK | Freq: Every day | ORAL | Status: DC

## 2018-05-09 MED ORDER — JEVITY 1.2 CAL PO LIQD: 1000.0000 mL | ORAL | 0 refills | Status: DC

## 2018-05-09 MED ORDER — SODIUM CHLORIDE 0.9 % IV SOLN: 2.0000 g | Freq: Three times a day (TID) | INTRAVENOUS | Status: DC

## 2018-05-09 MED ORDER — ALBUTEROL SULFATE (2.5 MG/3ML) 0.083% IN NEBU: 2.5000 mg | INHALATION_SOLUTION | RESPIRATORY_TRACT | 12 refills | Status: AC | PRN

## 2018-05-09 NOTE — Progress Notes (Signed)
Clinical impression:  Patient progressing slowly, seen with PT.  Continues to require cueing for safety, impulsivity and line mgmt but improved ability to follow multi-step commands (with increased time and cueing) today. Min to mod assist +2 during functional BSC transfer, total assist for toileting but good awareness for need to use commode.  RR increased to 38 during ADLs/transfers, 40% FiO2, 5 PEEP; pt eager to continue therapy and requires cueing for rest breaks.  Planned dc to [facility] today.  Will follow.     05/09/18 1600  OT Visit Information  Last OT Received On 05/09/18  Assistance Needed +2  PT/OT/SLP Co-Evaluation/Treatment Yes  Reason for Co-Treatment Complexity of the patient's impairments (multi-system involvement)  PT goals addressed during session Mobility/safety with mobility  OT goals addressed during session ADL's and self-care  History of Present Illness Pt transferred from [facility] with Acute hypercarbic and hypoxemic respiratory failure and copd exacerbation due to flu+ and PNA. Pt intubated 3/15 -3/18 and reintubated 3/19. Pt trached on 3/24.   Precautions  Precautions Fall;Other (comment)  Precaution Comments trach, vent  Pain Assessment  Pain Assessment Faces  Faces Pain Scale 0  Pain Intervention(s) Monitored during session  Cognition  Arousal/Alertness Awake/alert  Behavior During Therapy Restless;Impulsive  Overall Cognitive Status Impaired/Different from baseline  Area of Impairment Attention;Following commands;Safety/judgement;Problem solving;Awareness  Current Attention Level Sustained  Following Commands Follows one step commands with increased time  Safety/Judgement Decreased awareness of safety  Awareness Intellectual  Problem Solving Slow processing;Requires verbal cues;Requires tactile cues  General Comments decreased impulsivity today, follow 1 step commands consistently and easily redirected  Difficult to assess due to Tracheostomy  ADL  Overall  ADL's  Needs assistance/impaired  Grooming Wash/dry face;Sitting;Supervision/safety  Toilet Transfer Moderate assistance;+2 for physical assistance;+2 for safety/equipment;Stand-pivot;BSC;Grab bars  Toilet Transfer Details (indicate cue type and reason) to [facility], cueing for safety   Toileting- Clothing Manipulation and Hygiene Total assistance;+2 for physical assistance;Sit to/from stand  Functional mobility during ADLs Moderate assistance;+2 for safety/equipment  Bed Mobility  Overal bed mobility Needs Assistance  Bed Mobility Supine to Sit  Supine to sit Min guard  General bed mobility comments No physical assistance required  Balance  Overall balance assessment Needs assistance  Sitting-balance support Bilateral upper extremity supported;Feet supported  Sitting balance-Leahy Scale Fair  Standing balance support Bilateral upper extremity supported  Standing balance-Leahy Scale Poor  Standing balance comment reliance on external assist in upright  Restrictions  Weight Bearing Restrictions No  Transfers  Overall transfer level Needs assistance  Equipment used Rolling walker (2 wheeled)  Transfers Sit to/from Stand;Stand Pivot Transfers  Sit to Stand Min assist;+2 physical assistance  Stand pivot transfers Mod assist;+2 physical assistance  General transfer comment Min assist for elevation to upright from bed, moderate assist during transition from bed <> BSC. Cues for hand placement and walker management. Pt able to take side steps up towards head of bed  General Comments  General comments (skin integrity, edema, etc.) on vent via trach collar   OT - End of Session  Equipment Utilized During Treatment Rolling walker  Activity Tolerance Patient tolerated treatment well  Patient left in bed;with call bell/phone within reach;with bed alarm set;with nursing/sitter in room  Nurse Communication Mobility status;Precautions  OT Assessment/Plan  OT Plan Discharge plan remains  appropriate;Frequency remains appropriate  OT Visit Diagnosis Other abnormalities of gait and mobility (R26.89);Muscle weakness (generalized) (M62.81);Other symptoms and signs involving cognitive function  OT Frequency (ACUTE ONLY) Min 2X/week  Follow Up Recommendations  LTACH;Supervision/Assistance - 24 hour  OT Equipment Other (comment) (TBD)  AM-PAC OT "6 Clicks" Daily Activity Outcome Measure (Version 2)  Help from another person eating meals? 1  Help from another person taking care of personal grooming? 3  Help from another person toileting, which includes using toliet, bedpan, or urinal? 1  Help from another person bathing (including washing, rinsing, drying)? 2  Help from another person to put on and taking off regular upper body clothing? 2  Help from another person to put on and taking off regular lower body clothing? 2  6 Click Score 11  OT Goal Progression  Progress towards OT goals Progressing toward goals  Acute Rehab OT Goals  Patient Stated Goal unable to state  OT Time Calculation  OT Start Time (ACUTE ONLY) 1419  OT Stop Time (ACUTE ONLY) 1447  OT Time Calculation (min) 28 min  OT General Charges  $OT Visit 1 Visit  OT Treatments  $Self Care/Home Management  8-22 mins    Delight Stare, [place] Pager [phone] Office [phone]

## 2018-05-09 NOTE — TOC Transition Note (Addendum)
Transition of Care [facility]) - CM/SW Discharge Note   Patient Details  Name: Kyle Lowery MRN: [id number] Date of Birth: [birth year]  [facility] CM/SW Contact:  Midge Minium RN, BSN, NCM-BC, ACM-RN [phone] Phone Number: 05/09/2018, 12:33 PM   Clinical Narrative: Call received from [place], [facility] liaison, that patient has received insurance auth with a bed available today; if the attending is agreeable to transfer. CM will fax the H/P, 3 days progress notes and DC summary once it has been determined if patient is stable for transfer. POC has been discussed with patients spouse and she is agreeable to transfer.    05/09/18 @ 1315-Lashonta Pilling RNCM-Dr. Halford Chessman is agreeable to patient transitioning to [facility] with DC summary provided. Transfer information provided from [place]: Patient will transfer to 5E27 with receiving physician Dr. Satira Sark; report to be called to [phone]. CM communicated the information to [facility].   Final next level of care: Long Term Acute Care (LTAC) Barriers to Discharge: No Barriers Identified   Patient Goals and CMS Choice Patient states their goals for this hospitalization and ongoing recovery are:: Patient unable to participate in goal setting; on vent support CMS Medicare.gov Compare Post Acute Care list provided to:: Patient Represenative (must comment)([name] (spouse)) Choice offered to / list presented to : Spouse  Discharge Placement                       Discharge Plan and Services In-house Referral: NA Discharge Planning Services: CM Consult Post Acute Care Choice: Long Term Acute Care (LTAC)          DME Arranged: N/A DME Agency: NA HH Arranged: NA HH Agency: NA   Social Determinants of Health (SDOH) Interventions     Readmission Risk Interventions Readmission Risk Prevention Plan 05/07/2018  Transportation Screening Complete  PCP or Specialist Appt within 3-5 Days Not Complete   Not Complete comments Patient will transition to [place] or [place] Complete  Social Work Consult for [place] Planning/Counseling Complete  Palliative Care Screening Not Applicable  Medication Review Press photographer) Complete  Some recent data might be hidden

## 2018-05-09 NOTE — Progress Notes (Signed)
NAME:  Kyle Lowery, MRN:  [id number], DOB:  February 06, 1951, LOS: 44 ADMISSION DATE:  04/22/2018, CONSULTATION DATE:  04/22/2018 REFERRING MD:  Dr. Roderic Palau, CHIEF COMPLAINT:  Respiratory Distress    History of present illness   68 yo male with cough, fever x 2 weeks.  Presented to [facility] 3/15 with A fib RVR, hypoxia, and pulmonary infiltrates.  Found to have influenza A and Haemophilus.  Past Medical History  CAD, COPD, Depression   Significant Hospital Events   3/15 Presents to [facility] > Transferred to [facility]  3/17 Extubated 3/18 Re-intubated for increased WOB 3/22 Sedation needs / waxing agitated delirium 3/25 Weaning on ATC, episodes of AfibRVR, ongoing agitation, NGT placed  3/31 Add precedex 4/01 Transfuse 1 unit PRBC  Consults:    Procedures:  ETT 3/15 > 3/17 Trach 3/24 >>  Significant Diagnostic Tests:  3/16 TTE >> EF 55-60%, no wall motion abnormalities, normal RV, mild tricuspid thickening  Micro Data:  Tracheal Asp 3/15 >> Moderate Haemophilus influenzae Flu PCR 3/15 >> FLU A positive  Blood 3/15 >> negative RVP 3/15 >> negative 3/26 Trach asp >> Pseudomonas, Viridans Streptococcus 3/27 BC x2 >>  Antimicrobials:  Cefepime 3/15 >> 3/20 Vancomycin 3/15 >> 3/17 Azithromycin 3/16 >> 3/20 Tamiflu 3/16 >> 3/20 Cefepime 3/27 >>   Interim history/subjective:  Remains on precedex.  Objective   Blood pressure 108/61, pulse 75, temperature 98.2 F (36.8 C), temperature source Oral, resp. rate (!) 21, height 5\' 8"  (1.727 m), weight 63.4 kg, SpO2 100 %.    Vent Mode: PRVC FiO2 (%):  [40 %] 40 % Set Rate:  [20 bmp] 20 bmp Vt Set:  [580 mL] 580 mL PEEP:  [5 cmH20] 5 cmH20 Plateau Pressure:  [12 cmH20-18 cmH20] 18 cmH20   Intake/Output Summary (Last 24 hours) at 05/09/2018 0949 Last data filed at 05/09/2018 0700 Gross per 24 hour  Intake 1911.09 ml  Output 1095 ml  Net 816.09 ml   Filed Weights   05/07/18 0600 05/08/18 0404 05/09/18 0500  Weight: 62.9 kg 63.7 kg 63.4 kg    EXAM  General - alert Eyes - pupils reactive ENT - trach site clean Cardiac - regular rate/rhythm, no murmur Chest - decreased BS, scattered rhonchi Abdomen - soft, non tender, + bowel sounds Extremities - no cyanosis, clubbing, or edema Skin - no rashes Neuro - intermittently agitated   Resolved Hospital Problem list   Lactic acidosis, Hypotension secondary to sedation, COPD exacerbation, Influenza A PNA, Haemophilus PNA, AKI from ATN, hypernatremia   Assessment & Plan:   Acute hypoxic/hypercapnic respiratory failure from PNA. Failure to wean s/p trach. COPD. Plan - pressure support wean to trach collar as able - f/u CXR intermittently - d/c trach sutures 4/03 - pulmicort, brovana, duoneb  HCAP from Pseudomonas and Viridans Strep. Plan - day 6/7 ABx  Paroxysmal A fib - now in sinus. Hx of CAD, HTN, HLD. Plan - continue eliquis, cardizem, lopressor, pravachol  Hypokalemia, hypomagnesemia. Plan - replace as needed  Delirium. Hx of depression. Plan - continue paxil, seroquel, klonopin - precedex for RASS goal 0 to -1  Moderate protein calorie malnutrition. Plan - tube feeds  Anemia of critical illness and iron deficiency. Plan - f/u CBC - add feosol and vitamin C - transfuse for Hb < 7  Deconditioning. Plan - PT/OT  Best practice:  Diet: tube feeds DVT prophylaxis: eliquis GI prophylaxis: protonix Mobility: PT/OT Code Status: full code Disposition: might be good candidate for LTAC  PCCM  will follow up intermittently for trach vent management.  Call if help needed sooner.  Labs    CMP Latest Ref Rng & Units 05/09/2018 05/08/2018 05/07/2018  Glucose 70 - 99 mg/dL 131(H) 448(H) 90  BUN 8 - 23 mg/dL 19 17 15   Creatinine 0.61 - 1.24 mg/dL 0.80 0.73 0.72  Sodium 135 - 145 mmol/L 141 137 140  Potassium 3.5 - 5.1 mmol/L 3.3(L) 4.7 3.1(L)  Chloride 98 - 111 mmol/L 109 108 110  CO2 22 - 32 mmol/L 25 25 24   Calcium 8.9 - 10.3 mg/dL 7.5(L) 6.9(L)  7.3(L)  Total Protein 6.5 - 8.1 g/dL - - -  Total Bilirubin 0.3 - 1.2 mg/dL - - -  Alkaline Phos 38 - 126 U/L - - -  AST 15 - 41 U/L - - -  ALT 0 - 44 U/L - - -   CBC Latest Ref Rng & Units 05/09/2018 05/08/2018 05/06/2018  WBC 4.0 - 10.5 K/uL 4.9 5.2 6.8  Hemoglobin 13.0 - 17.0 g/dL 6.8(LL) 7.1(L) 8.3(L)  Hematocrit 39.0 - 52.0 % 21.7(L) 23.7(L) 25.9(L)  Platelets 150 - 400 K/uL 177 195 184   ABG    Component Value Date/Time   PHART 7.435 05/01/2018 0407   PCO2ART 43.2 05/01/2018 0407   PO2ART 75.0 (L) 05/01/2018 0407   HCO3 28.9 (H) 05/01/2018 0407   TCO2 30 05/01/2018 0407   ACIDBASEDEF 1.4 04/25/2018 2320   O2SAT 95.0 05/01/2018 0407   CBG (last 3)  Recent Labs    05/09/18 0035 05/09/18 0501 05/09/18 [place] [facility], MD [place] Pulmonary/Critical Care 05/09/2018, 9:49 AM

## 2018-05-09 NOTE — Discharge Instructions (Signed)

## 2018-05-09 NOTE — Progress Notes (Signed)
Report called to [place].

## 2018-05-09 NOTE — Progress Notes (Signed)
[facility] Progress Note Patient Name: Kyle Lowery DOB: [birth year] MRN: [id number]   Date of Service  05/09/2018  HPI/Events of Note  Hgb = 6.8   eICU Interventions  Will transfuse 1 unit PRBC.     Intervention Category Major Interventions: Other:  Laisa Larrick Cornelia Copa 05/09/2018, 6:28 AM

## 2018-05-09 NOTE — Progress Notes (Signed)
[facility] ADULT ICU REPLACEMENT PROTOCOL FOR AM LAB REPLACEMENT ONLY  The patient does apply for the [facility] Adult ICU Electrolyte Replacment Protocol based on the criteria listed below:   1. Is GFR >/= 40 ml/min? Yes.    Patient's GFR today is >60 2. Is urine output >/= 0.5 ml/kg/hr for the last 6 hours? Yes.   Patient's UOP is 1 ml/kg/hr 3. Is BUN < 60 mg/dL? Yes.    Patient's BUN today is 19 4. Abnormal electrolyte(s): K-3.3 5. Ordered repletion with: per protocol 6. If a panic level lab has been reported, has the CCM MD in charge been notified? Yes.  .   Physician:  Dr. Terrill Mohr, Philis Nettle 05/09/2018 6:12 AM

## 2018-05-09 NOTE — Discharge Summary (Addendum)
NAME:  Kyle Lowery, MRN:  [id number], DOB:  1950-05-24, LOS: 53 ADMISSION DATE:  04/22/2018, CONSULTATION DATE:  04/22/2018 REFERRING MD:  Dr. Roderic Palau, CHIEF COMPLAINT:  Respiratory Distress    History of present illness   68 yo male with cough, fever x 2 weeks.  Presented to [facility] 3/15 with A fib RVR, hypoxia, and pulmonary infiltrates.  Found to have influenza A and Haemophilus.  Past Medical History  CAD, COPD, Depression   Significant Hospital Events   3/15 Presents to [facility] > Transfer[facility]  3/17 Extubated 3/18 Re-intubated for increased WOB 3/22 Sedation needs / waxing agitated delirium 3/25 Weaning on ATC, episodes of AfibRVR, ongoing agitation, NGT placed  3/31 Add precedex 4/01 Transfuse 1 unit PRBC  Consults:    Procedures:  ETT 3/15 > 3/17 Trach 3/24 >>  Significant Diagnostic Tests:  3/16 TTE >> EF 55-60%, no wall motion abnormalities, normal RV, mild tricuspid thickening  Micro Data:  Tracheal Asp 3/15 >> Moderate Haemophilus influenzae Flu PCR 3/15 >> FLU A positive  Blood 3/15 >> negative RVP 3/15 >> negative 3/26 Trach asp >> Pseudomonas, Viridans Streptococcus 3/27 BC x2 >>  Antimicrobials:  Cefepime 3/15 >> 3/20 Vancomycin 3/15 >> 3/17 Azithromycin 3/16 >> 3/20 Tamiflu 3/16 >> 3/20 Cefepime 3/27 >>   Interim history/subjective:  Patient has been awarded bed at [facility].  He remains HDS and has had no acute decompensations since this morning when last seen by Pulmonary/Critical Care.   Objective   Blood pressure (!) 116/57, pulse 84, temperature 98.8 F (37.1 C), temperature source Oral, resp. rate 18, height 5\' 8"  (1.727 m), weight 63.4 kg, SpO2 100 %.    Vent Mode: PSV;CPAP FiO2 (%):  [40 %] 40 % Set Rate:  [20 bmp] 20 bmp Vt Set:  [580 mL] 580 mL PEEP:  [5 cmH20] 5 cmH20 Pressure Support:  [12 cmH20] 12 cmH20 Plateau Pressure:  [12 cmH20-18 cmH20] 18 cmH20   Intake/Output Summary (Last 24 hours) at 05/09/2018 1241  Last data filed at 05/09/2018 1100 Gross per 24 hour  Intake 2088.83 ml  Output 755 ml  Net 1333.83 ml   Filed Weights   05/07/18 0600 05/08/18 0404 05/09/18 0500  Weight: 62.9 kg 63.7 kg 63.4 kg   EXAM  General - patient is alert Eyes -  Pupils are reactive  ENT - Trach secure, trach site clean  Cardiac - RRR no murmur  Chest - diminished breath sounds with scattered rhonchi  Abdomen - soft, non-tender. + bowel sounds  Extremities - no edema, no cyanosis, no clubbing  Skin -without rashes  Neuro - alert, intermittently agitated   Resolved Hospital Problem list   Lactic acidosis, Hypotension secondary to sedation, COPD exacerbation, Influenza A PNA, Haemophilus PNA, AKI from ATN, hypernatremia   Assessment & Plan:   Acute hypoxic/hypercapnic respiratory failure from PNA. Failure to wean s/p trach. COPD. Plan -patient requiring ongoing vent support with trach , has not been able to wean from vent -Titrate for SpO2 goal 88-92% given underlying COPD  - pressure support wean to trach collar as able - f/u CXR intermittently - d/c trach sutures 4/03 - pulmicort, brovana, duoneb  HCAP from Pseudomonas and Viridans Strep. Plan - day 6/7 abx, continue cefepime  Paroxysmal A fib - now in sinus. Hx of CAD, HTN, HLD. Plan - continue eliquis, cardizem, lopressor, pravachol  Hypokalemia, hypomagnesemia. Plan - replace as needed  Delirium. Hx of depression. Plan - continue paxil, seroquel, klonopin -  precedex for RASS goal 0 to -1  Moderate protein calorie malnutrition. Plan - tube feeds  Anemia of critical illness and iron deficiency. Plan - f/u CBC - add feosol and vitamin C - transfuse for Hb < 7  Deconditioning. Plan - PT/OT  Best practice:  Diet: tube feeds DVT prophylaxis: eliquis GI prophylaxis: protonix Mobility: PT/OT Code Status: full code Disposition: patient has been assigned bad at [facility]. He is HDS. He is requiring ongoing  vent support via tracheostomy and is appropriate for discharge to [facility].   Labs    CMP Latest Ref Rng & Units 05/09/2018 05/08/2018 05/07/2018  Glucose 70 - 99 mg/dL 131(H) 448(H) 90  BUN 8 - 23 mg/dL 19 17 15   Creatinine 0.61 - 1.24 mg/dL 0.80 0.73 0.72  Sodium 135 - 145 mmol/L 141 137 140  Potassium 3.5 - 5.1 mmol/L 3.3(L) 4.7 3.1(L)  Chloride 98 - 111 mmol/L 109 108 110  CO2 22 - 32 mmol/L 25 25 24   Calcium 8.9 - 10.3 mg/dL 7.5(L) 6.9(L) 7.3(L)  Total Protein 6.5 - 8.1 g/dL - - -  Total Bilirubin 0.3 - 1.2 mg/dL - - -  Alkaline Phos 38 - 126 U/L - - -  AST 15 - 41 U/L - - -  ALT 0 - 44 U/L - - -   CBC Latest Ref Rng & Units 05/09/2018 05/08/2018 05/06/2018  WBC 4.0 - 10.5 K/uL 4.9 5.2 6.8  Hemoglobin 13.0 - 17.0 g/dL 6.8(LL) 7.1(L) 8.3(L)  Hematocrit 39.0 - 52.0 % 21.7(L) 23.7(L) 25.9(L)  Platelets 150 - 400 K/uL 177 195 184   ABG    Component Value Date/Time   PHART 7.435 05/01/2018 0407   PCO2ART 43.2 05/01/2018 0407   PO2ART 75.0 (L) 05/01/2018 0407   HCO3 28.9 (H) 05/01/2018 0407   TCO2 30 05/01/2018 0407   ACIDBASEDEF 1.4 04/25/2018 2320   O2SAT 95.0 05/01/2018 0407   CBG (last 3)  Recent Labs    05/09/18 0501 05/09/18 0747 05/09/18 1202  GLUCAP 116* 122* 107*     Total Time to Discharge: 35 minutes   Eliseo Gum MSN, AGACNP-BC [place] [phone] If no answer, [phone] 05/09/2018, 12:41 PM   Has bed in LTAC.  Medically ready for transition.  Chesley Mires, MD [facility] Pulmonary/Critical Care 05/09/2018, 12:53 PM

## 2018-05-09 NOTE — Progress Notes (Signed)
Trach sutures were removed per CCM order. Lurline Idol is secure with trach ties.

## 2018-05-09 NOTE — Progress Notes (Signed)
Physical Therapy Treatment Patient Details Name: Kyle Lowery MRN: [id number] DOB: 26-May-1950 Today's Date: 05/09/2018    History of Present Illness Pt transferred from [facility] with Acute hypercarbic and hypoxemic respiratory failure and copd exacerbation due to flu+ and PNA. Pt intubated 3/15 -3/18 and reintubated 3/19. Pt trached on 3/24.     PT Comments    Pt progressing slowly towards physical therapy goals. Requiring less assistance for bed mobility. Continuing to require two person min-mod assist for transfers. Increased RR to 38 during mobility; 40% FiO2, 5 PEEP. Pt continues with some impulsivity but able to be redirected.    Follow Up Recommendations  LTACH     Equipment Recommendations  Other (comment)(To be determined)    Recommendations for Other Services OT consult     Precautions / Restrictions Precautions Precautions: Fall;Other (comment) Precaution Comments: trach, vent Restrictions Weight Bearing Restrictions: No    Mobility  Bed Mobility Overal bed mobility: Needs Assistance Bed Mobility: Supine to Sit     Supine to sit: Min guard     General bed mobility comments: No physical assistance required  Transfers Overall transfer level: Needs assistance Equipment used: Rolling walker (2 wheeled) Transfers: Sit to/from [facility] Sit to Stand: Min assist;+2 physical assistance Stand pivot transfers: Mod assist;+2 physical assistance       General transfer comment: Min assist for elevation to upright from bed, moderate assist during transition from bed <> BSC. Cues for hand placement and walker management. Pt able to take side steps up towards head of bed  Ambulation/Gait                 Stairs             Wheelchair Mobility    Modified Rankin (Stroke Patients Only)       Balance Overall balance assessment: Needs assistance Sitting-balance support: Bilateral upper extremity supported;Feet  supported Sitting balance-Leahy Scale: Fair     Standing balance support: Bilateral upper extremity supported Standing balance-Leahy Scale: Poor Standing balance comment: reliance on external assist in upright, slight posterior lean                            Cognition Arousal/Alertness: Awake/alert Behavior During Therapy: Restless;Impulsive Overall Cognitive Status: Impaired/Different from baseline Area of Impairment: Attention;Following commands;Safety/judgement;Problem solving;Awareness                   Current Attention Level: Sustained   Following Commands: Follows one step commands consistently Safety/Judgement: Decreased awareness of safety Awareness: Intellectual Problem Solving: Slow processing;Requires verbal cues;Requires tactile cues General Comments: improved ability to follow commands, remains slightly impulsive      Exercises      General Comments        Pertinent Vitals/Pain Pain Assessment: Faces Faces Pain Scale: Hurts little more Pain Location: pt pointing to neck? Pain Intervention(s): Monitored during session    Home Living                      Prior Function            PT Goals (current goals can now be found in the care plan section) Acute Rehab PT Goals Patient Stated Goal: unable to state PT Goal Formulation: Patient unable to participate in goal setting Time For Goal Achievement: 05/17/18 Potential to Achieve Goals: Fair Progress towards PT goals: Progressing toward goals    Frequency    Min  2X/week      PT Plan Current plan remains appropriate    Co-evaluation PT/OT/SLP Co-Evaluation/Treatment: Yes Reason for Co-Treatment: Complexity of the patient's impairments (multi-system involvement);Necessary to address cognition/behavior during functional activity;For patient/therapist safety;To address functional/ADL transfers PT goals addressed during session: Balance;Mobility/safety with mobility;Proper  use of DME OT goals addressed during session: ADL's and self-care      AM-PAC PT "6 Clicks" Mobility   Outcome Measure  Help needed turning from your back to your side while in a flat bed without using bedrails?: A Lot Help needed moving from lying on your back to sitting on the side of a flat bed without using bedrails?: A Lot Help needed moving to and from a bed to a chair (including a wheelchair)?: A Lot Help needed standing up from a chair using your arms (e.g., wheelchair or bedside chair)?: A Lot Help needed to walk in hospital room?: Total Help needed climbing 3-5 steps with a railing? : Total 6 Click Score: 10    End of Session Equipment Utilized During Treatment: Oxygen(vent) Activity Tolerance: Patient limited by fatigue Patient left: with call bell/phone within reach;with nursing/sitter in room;in bed;with bed alarm set Nurse Communication: Mobility status PT Visit Diagnosis: Other abnormalities of gait and mobility (R26.89);Muscle weakness (generalized) (M62.81)     Time: 8412-8208 PT Time Calculation (min) (ACUTE ONLY): 28 min  Charges:  $Therapeutic Activity: 8-22 mins                     Ellamae Sia, PT, DPT Acute Rehabilitation Services Pager [phone] Office [phone]   Willy Eddy 05/09/2018, 4:29 PM

## 2018-05-10 ENCOUNTER — Encounter: Payer: Self-pay | Admitting: Internal Medicine

## 2018-05-10 DIAGNOSIS — I480 Paroxysmal atrial fibrillation: Secondary | ICD-10-CM

## 2018-05-10 DIAGNOSIS — J9621 Acute and chronic respiratory failure with hypoxia: Secondary | ICD-10-CM

## 2018-05-10 DIAGNOSIS — R410 Disorientation, unspecified: Secondary | ICD-10-CM

## 2018-05-10 DIAGNOSIS — J151 Pneumonia due to Pseudomonas: Secondary | ICD-10-CM

## 2018-05-10 DIAGNOSIS — J449 Chronic obstructive pulmonary disease, unspecified: Secondary | ICD-10-CM

## 2018-05-10 LAB — TYPE AND SCREEN
ABO/RH(D): O POS
Antibody Screen: NEGATIVE
Unit division: 0

## 2018-05-10 LAB — CBC WITH DIFFERENTIAL/PLATELET
Abs Immature Granulocytes: 0.25 10*3/uL — ABNORMAL HIGH (ref 0.00–0.07)
Basophils Absolute: 0.1 10*3/uL (ref 0.0–0.1)
Basophils Relative: 1 %
Eosinophils Absolute: 0.1 10*3/uL (ref 0.0–0.5)
Eosinophils Relative: 1 %
HCT: 33.4 % — ABNORMAL LOW (ref 39.0–52.0)
Hemoglobin: 10.5 g/dL — ABNORMAL LOW (ref 13.0–17.0)
Immature Granulocytes: 2 %
Lymphocytes Relative: 11 %
Lymphs Abs: 1.6 10*3/uL (ref 0.7–4.0)
MCH: 32 pg (ref 26.0–34.0)
MCHC: 31.4 g/dL (ref 30.0–36.0)
MCV: 101.8 fL — ABNORMAL HIGH (ref 80.0–100.0)
Monocytes Absolute: 1.5 10*3/uL — ABNORMAL HIGH (ref 0.1–1.0)
Monocytes Relative: 10 %
Neutro Abs: 11 10*3/uL — ABNORMAL HIGH (ref 1.7–7.7)
Neutrophils Relative %: 75 %
Platelets: UNDETERMINED 10*3/uL (ref 150–400)
RBC: 3.28 MIL/uL — ABNORMAL LOW (ref 4.22–5.81)
RDW: 17.9 % — ABNORMAL HIGH (ref 11.5–15.5)
WBC: 14.5 10*3/uL — ABNORMAL HIGH (ref 4.0–10.5)
nRBC: 0.1 % (ref 0.0–0.2)

## 2018-05-10 LAB — COMPREHENSIVE METABOLIC PANEL
ALT: 61 U/L — ABNORMAL HIGH (ref 0–44)
AST: 33 U/L (ref 15–41)
Albumin: 2.1 g/dL — ABNORMAL LOW (ref 3.5–5.0)
Alkaline Phosphatase: 82 U/L (ref 38–126)
Anion gap: 7 (ref 5–15)
BUN: 21 mg/dL (ref 8–23)
CO2: 28 mmol/L (ref 22–32)
Calcium: 7.8 mg/dL — ABNORMAL LOW (ref 8.9–10.3)
Chloride: 108 mmol/L (ref 98–111)
Creatinine, Ser: 0.72 mg/dL (ref 0.61–1.24)
GFR calc Af Amer: >60 mL/min (ref >60)
GFR calc non Af Amer: >60 mL/min (ref >60)
Glucose, Bld: 145 mg/dL — ABNORMAL HIGH (ref 70–99)
Potassium: 4.1 mmol/L (ref 3.5–5.1)
Sodium: 143 mmol/L (ref 135–145)
Total Bilirubin: 0.5 mg/dL (ref 0.3–1.2)
Total Protein: 6.5 g/dL (ref 6.5–8.1)

## 2018-05-10 LAB — BPAM RBC
Blood Product Expiration Date: 202004222359
ISSUE DATE / TIME: 202004011151
Unit Type and Rh: 5100

## 2018-05-10 LAB — PROTIME-INR
INR: 1.4 — ABNORMAL HIGH (ref 0.8–1.2)
Prothrombin Time: 17.3 seconds — ABNORMAL HIGH (ref 11.4–15.2)

## 2018-05-10 LAB — PHOSPHORUS: Phosphorus: 3.2 mg/dL (ref 2.5–4.6)

## 2018-05-10 LAB — HEMOGLOBIN A1C
Hgb A1c MFr Bld: 5.9 % — ABNORMAL HIGH (ref 4.8–5.6)
Mean Plasma Glucose: 122.63 mg/dL

## 2018-05-10 LAB — MAGNESIUM: Magnesium: 2 mg/dL (ref 1.7–2.4)

## 2018-05-10 LAB — TSH: TSH: 6.159 u[IU]/mL — ABNORMAL HIGH (ref 0.350–4.500)

## 2018-05-10 NOTE — Consult Note (Signed)
[facility] [place]  PULMONARY SERVICE  Date of Service: 05/10/2018  PULMONARY CRITICAL CARE CONSULT   Kyle Lowery  JXB:[id number]  DOB: 14-Aug-1950   DOA: 05/09/2018  Referring Physician: Merton Border, MD  HPI: Kyle Lowery is a 68 y.o. male seen for follow up of Acute on Chronic Respiratory Failure.  Patient has multiple medical problems including history of coronary artery disease COPD depression came into the hospital because of a flulike illness going on for about 2 weeks.  Patient had been having some cough and congestion and bringing up sputum.  Has had a low-grade fever noted about 99 degrees.  Had been also taking Advil.  Patient has been complaining about having body aches and diarrhea.  Patient ended up going to the emergency department and was admitted started on Tamiflu.  Patient had decompensation and was intubated.  Patient was transferred to [facility] on the 15th he was extubated on the 17th however failed and had to be reintubated on the 18th.  Patient also developed issues with delirium.  Subsequently ended up having to have a tracheostomy for failure to wean and then was started on T collar trials however developed atrial fibrillation with RVR.  Patient now is on assist control mode on 40% oxygen.  Review of Systems:  ROS performed and is unremarkable other than noted above.  Past Medical History:  Diagnosis Date  . Carotid artery stenosis   . COPD (chronic obstructive pulmonary disease) ([place])   . Coronary atherosclerosis of native coronary artery    Mild to moderate NOCAD with 80% ostial diagonal 1/12  . Depression    Prior suicide attempt  . Essential hypertension, benign   . GERD (gastroesophageal reflux disease)    Esophageal dilatation  . Mixed hyperlipidemia   . Syncope    Neurocardiogenic    Past Surgical History:  Procedure Laterality Date  . ESOPHAGOGASTRODUODENOSCOPY (EGD) WITH ESOPHAGEAL DILATION    .  INGUINAL HERNIA REPAIR    . LEFT HEART CATH     Unable to Stent Blockage  . Left orchiectomy    . VASECTOMY      Social History:    reports that he has been smoking cigarettes. He started smoking about 54 years ago. He has a 53.00 pack-year smoking history. He has never used smokeless tobacco. He reports that he does not drink alcohol or use drugs.  Family History: Non-Contributory to the present illness  No Known Allergies  Medications: Reviewed on Rounds  Physical Exam:  Vitals: Temperature 96.9 pulse 98 respiratory 33 blood pressure 116/83 saturations 94%  Ventilator Settings mode of ventilation assist control FiO2 40% tidal volume 499 PEEP 5  . General: Comfortable at this time . Eyes: Grossly normal lids, irises & conjunctiva . ENT: grossly tongue is normal . Neck: no obvious mass . Cardiovascular: S1-S2 normal no gallop or rub is noted . Respiratory: No rhonchi or rales are noted at this time . Abdomen: Soft and nontender . Skin: no rash seen on limited exam . Musculoskeletal: not rigid . Psychiatric:unable to assess . Neurologic: no seizure no involuntary movements         Labs on Admission:  Basic Metabolic Panel: Recent Labs  Lab 05/04/18 0345 05/05/18 0732  05/06/18 2018 05/07/18 0340 05/08/18 0357 05/09/18 0500 05/10/18 0746  NA 143 141  141   < > 140 140 137 141 143  K 3.2* 2.9*  3.1*   < > 3.1* 3.1* 4.7 3.3*  4.1  CL 108 111  109   < > 110 110 108 109 108  CO2 26 23  26    < > 25 24 25 25 28   GLUCOSE 163* 116*  124*   < > 109* 90 448* 131* 145*  BUN 19 16  17    < > 18 15 17 19 21   CREATININE 0.95 0.78  0.89   < > 0.72 0.72 0.73 0.80 0.72  CALCIUM 7.3* 6.9*  7.5*   < > 7.2* 7.3* 6.9* 7.5* 7.8*  MG 2.1 1.5*  --   --  1.5* 1.8 1.8 2.0  PHOS 2.4* 2.4*  2.5  --   --   --   --   --  3.2   < > = values in this interval not displayed.    No results for input(s): PHART, PCO2ART, PO2ART, HCO3, O2SAT in the last 168 hours.  Liver Function  Tests: Recent Labs  Lab 05/04/18 0345 05/05/18 0732 05/10/18 0746  AST  --   --  33  ALT  --   --  61*  ALKPHOS  --   --  82  BILITOT  --   --  0.5  PROT  --   --  6.5  ALBUMIN 1.5* 1.4* 2.1*   No results for input(s): LIPASE, AMYLASE in the last 168 hours. No results for input(s): AMMONIA in the last 168 hours.  CBC: Recent Labs  Lab 05/05/18 0732 05/06/18 0307 05/08/18 0357 05/09/18 0500 05/10/18 0746  WBC 8.5 6.8 5.2 4.9 14.5*  NEUTROABS  --   --   --   --  11.0*  HGB 8.9* 8.3* 7.1* 6.8* 10.5*  HCT 28.2* 25.9* 23.7* 21.7* 33.4*  MCV 102.9* 102.0* 105.3* 104.8* 101.8*  PLT 179 184 195 177 PLATELET CLUMPS NOTED ON SMEAR, UNABLE TO ESTIMATE    Cardiac Enzymes: No results for input(s): CKTOTAL, CKMB, CKMBINDEX, TROPONINI in the last 168 hours.  BNP (last 3 results) Recent Labs    04/22/18 1430  BNP 433.0*    ProBNP (last 3 results) No results for input(s): PROBNP in the last 8760 hours.   Radiological Exams on Admission: Dg Chest Port 1 View  Result Date: 05/09/2018 CLINICAL DATA:  NG tube placement. EXAM: PORTABLE CHEST 1 VIEW COMPARISON:  Chest x-ray dated May 08, 2018. FINDINGS: Feeding tube entering the duodenum with the tip below the field of view, similar to prior study. Unchanged tracheostomy tube and left upper extremity PICC line. The heart size and mediastinal contours are within normal limits. Patchy bilateral airspace opacities are similar to prior study. No pleural effusion or pneumothorax. No acute osseous abnormality. IMPRESSION: 1. Feeding tube entering the duodenum with tip below the field of view. 2. Unchanged bilateral airspace disease. Electronically Signed   By: Titus Dubin M.D.   On: 05/09/2018 17:56   Dg Chest Port 1 View  Result Date: 05/08/2018 CLINICAL DATA:  Tracheostomy, respiratory failure EXAM: PORTABLE CHEST 1 VIEW COMPARISON:  05/06/2018 FINDINGS: Tracheostomy, left PICC line and feeding tube remain in place, unchanged. Patchy  bilateral airspace opacities again noted with slight improvement at the left base. Heart is normal size. No effusions or acute bony abnormality. IMPRESSION: Patchy bilateral airspace disease, slightly improved at the left base, otherwise unchanged. Electronically Signed   By: Rolm Baptise M.D.   On: 05/08/2018 07:39   Dg Abd Portable 1v  Result Date: 05/09/2018 CLINICAL DATA:  Impaired nasal gastric feeding tube. EXAM: PORTABLE ABDOMEN - 1 VIEW  COMPARISON:  Radiograph May 02, 2018. FINDINGS: Distal tip of feeding tube is seen in expected position of third portion of duodenum. No abnormal bowel dilatation is noted. IMPRESSION: Distal tip of feeding tube seen in expected position of third portion of duodenum. Electronically Signed   By: Marijo Conception, M.D.   On: 05/09/2018 21:22    Assessment/Plan Active Problems:   Acute on chronic respiratory failure with hypoxia (HCC)   COPD, severe (HCC)   Pneumonia due to Pseudomonas aeruginosa (HCC)   Paroxysmal atrial fibrillation (HCC)   Delirium, acute   1. Acute on chronic respiratory failure with hypoxia we will continue with full vent support.  Main issue has been agitation which patient has not been able to wean because of that.  Patient right now is also requiring 40% FiO2.  Last chest x-ray that was done was actually showing some improvement radiologically. 2. Severe COPD will continue with current management goal for saturations is about 88 to 92%. 3. Healthcare associated pneumonia Pseudomonas patient is treated with cefepime and should have completed antibiotics. 4. Paroxysmal atrial fibrillation currently rate controlled we will continue to monitor. 5. Delirium patient is on Paxil Seroquel Klonopin.  He is also been on Precedex in the intensive care unit.  Right now he did receive Versed and is more sedate will need to be continued to be monitored.  Limiting factor for weaning will be his delirium  I have personally seen and evaluated the  patient, evaluated laboratory and imaging results, formulated the assessment and plan and placed orders.  Patient is critically ill in danger of cardiac arrest and death critical care time spent review of the medications patient's ventilator status and discussion with multidisciplinary team The Patient requires high complexity decision making for assessment and support.  Case was discussed on Rounds with the Respiratory Therapy Staff Time Spent 55minutes  Allyne Gee, MD [facility] Pulmonary Critical Care Medicine Sleep Medicine

## 2018-05-11 ENCOUNTER — Other Ambulatory Visit (HOSPITAL_COMMUNITY): Payer: BLUE CROSS/BLUE SHIELD

## 2018-05-11 LAB — CBC
HCT: 32.2 % — ABNORMAL LOW (ref 39.0–52.0)
Hemoglobin: 9.9 g/dL — ABNORMAL LOW (ref 13.0–17.0)
MCH: 31.2 pg (ref 26.0–34.0)
MCHC: 30.7 g/dL (ref 30.0–36.0)
MCV: 101.6 fL — ABNORMAL HIGH (ref 80.0–100.0)
Platelets: 311 10*3/uL (ref 150–400)
RBC: 3.17 MIL/uL — ABNORMAL LOW (ref 4.22–5.81)
RDW: 17.3 % — ABNORMAL HIGH (ref 11.5–15.5)
WBC: 10.2 10*3/uL (ref 4.0–10.5)
nRBC: 0.2 % (ref 0.0–0.2)

## 2018-05-11 LAB — BLOOD GAS, ARTERIAL
Acid-Base Excess: 5.6 mmol/L — ABNORMAL HIGH (ref 0.0–2.0)
Bicarbonate: 30.3 mmol/L — ABNORMAL HIGH (ref 20.0–28.0)
FIO2: 40
MECHVT: 385 mL
O2 Saturation: 97.9 %
PEEP: 5 cmH2O
Patient temperature: 96.4
RATE: 16 resp/min
pCO2 arterial: 47.3 mmHg (ref 32.0–48.0)
pH, Arterial: 7.416 (ref 7.350–7.450)
pO2, Arterial: 101 mmHg (ref 83.0–108.0)

## 2018-05-11 LAB — BASIC METABOLIC PANEL
Anion gap: 9 (ref 5–15)
BUN: 23 mg/dL (ref 8–23)
CO2: 28 mmol/L (ref 22–32)
Calcium: 8.1 mg/dL — ABNORMAL LOW (ref 8.9–10.3)
Chloride: 106 mmol/L (ref 98–111)
Creatinine, Ser: 0.8 mg/dL (ref 0.61–1.24)
GFR calc Af Amer: >60 mL/min (ref >60)
GFR calc non Af Amer: >60 mL/min (ref >60)
Glucose, Bld: 108 mg/dL — ABNORMAL HIGH (ref 70–99)
Potassium: 3.8 mmol/L (ref 3.5–5.1)
Sodium: 143 mmol/L (ref 135–145)

## 2018-05-11 LAB — CULTURE, RESPIRATORY W GRAM STAIN

## 2018-05-11 LAB — PHOSPHORUS: Phosphorus: 2.6 mg/dL (ref 2.5–4.6)

## 2018-05-11 LAB — MAGNESIUM: Magnesium: 1.9 mg/dL (ref 1.7–2.4)

## 2018-05-11 LAB — CULTURE, RESPIRATORY

## 2018-05-11 NOTE — Progress Notes (Addendum)
Pulmonary Critical Care Medicine [place]   PULMONARY CRITICAL CARE SERVICE  PROGRESS NOTE  Date of Service: 05/11/2018  Kyle Lowery  KTG:[id number]  DOB: February 05, 1951   DOA: 05/09/2018  Referring Physician: Merton Border, MD  HPI: Kyle Lowery is a 68 y.o. male seen for follow up of Acute on Chronic Respiratory Failure.  Patient remains on ventilator assist control mode.  Currently requiring 35% FiO2.  Minimal secretions at this time overall patient is doing well.  Medications: Reviewed on Rounds  Physical Exam:  Vitals: Pulse 98 respirations 24 BP 166/79 O2 sat 100% temp 7.2  Ventilator Settings ventilator mode AC PC rate of 10 IP 18 FiO2 35% PEEP 5  . General: Comfortable at this time . Eyes: Grossly normal lids, irises & conjunctiva . ENT: grossly tongue is normal . Neck: no obvious mass . Cardiovascular: S1 S2 normal no gallop . Respiratory: No rales or rhonchi noted . Abdomen: soft . Skin: no rash seen on limited exam . Musculoskeletal: not rigid . Psychiatric:unable to assess . Neurologic: no seizure no involuntary movements         Lab Data:   Basic Metabolic Panel: Recent Labs  Lab 05/05/18 0732  05/07/18 0340 05/08/18 0357 05/09/18 0500 05/10/18 0746 05/11/18 0414  NA 141  141   < > 140 137 141 143 143  K 2.9*  3.1*   < > 3.1* 4.7 3.3* 4.1 3.8  CL 111  109   < > 110 108 109 108 106  CO2 23  26   < > 24 25 25 28 28   GLUCOSE 116*  124*   < > 90 448* 131* 145* 108*  BUN 16  17   < > 15 17 19 21 23   CREATININE 0.78  0.89   < > 0.72 0.73 0.80 0.72 0.80  CALCIUM 6.9*  7.5*   < > 7.3* 6.9* 7.5* 7.8* 8.1*  MG 1.5*  --  1.5* 1.8 1.8 2.0 1.9  PHOS 2.4*  2.5  --   --   --   --  3.2 2.6   < > = values in this interval not displayed.    ABG: Recent Labs  Lab 05/11/18 0936  PHART 7.416  PCO2ART 47.3  PO2ART 101  HCO3 30.3*  O2SAT 97.9    Liver Function Tests: Recent Labs  Lab 05/05/18 0732 05/10/18 0746  AST  --   33  ALT  --  61*  ALKPHOS  --  82  BILITOT  --  0.5  PROT  --  6.5  ALBUMIN 1.4* 2.1*   No results for input(s): LIPASE, AMYLASE in the last 168 hours. No results for input(s): AMMONIA in the last 168 hours.  CBC: Recent Labs  Lab 05/06/18 0307 05/08/18 0357 05/09/18 0500 05/10/18 0746 05/11/18 0414  WBC 6.8 5.2 4.9 14.5* 10.2  NEUTROABS  --   --   --  11.0*  --   HGB 8.3* 7.1* 6.8* 10.5* 9.9*  HCT 25.9* 23.7* 21.7* 33.4* 32.2*  MCV 102.0* 105.3* 104.8* 101.8* 101.6*  PLT 184 195 177 PLATELET CLUMPS NOTED ON SMEAR, UNABLE TO ESTIMATE 311    Cardiac Enzymes: No results for input(s): CKTOTAL, CKMB, CKMBINDEX, TROPONINI in the last 168 hours.  BNP (last 3 results) Recent Labs    04/22/18 1430  BNP 433.0*    ProBNP (last 3 results) No results for input(s): PROBNP in the last 8760 hours.  Radiological Exams: Dg Chest Port 1  View  Result Date: 05/11/2018 CLINICAL DATA:  Short of breath, tracheostomy. EXAM: PORTABLE CHEST 1 VIEW COMPARISON:  05/09/2018 FINDINGS: Tracheostomy tube and enteric tube unchanged. Increased consolidation in the RIGHT upper lobe in a segmental pattern. More subtle consolidation is increased in the LEFT upper lobe is also more prominent. Diffuse bilateral airspace densities also noted. And unchanged mild basilar atelectasis. No pneumothorax. IMPRESSION: 1. Increasing upper lobe airspace consolidation most consistent with progression of multifocal pneumonia. 2. Underlying diffuse airspace disease suggests pulmonary edema or infection. Electronically Signed   By: Suzy Bouchard M.D.   On: 05/11/2018 10:32   Dg Chest Port 1 View  Result Date: 05/09/2018 CLINICAL DATA:  NG tube placement. EXAM: PORTABLE CHEST 1 VIEW COMPARISON:  Chest x-ray dated May 08, 2018. FINDINGS: Feeding tube entering the duodenum with the tip below the field of view, similar to prior study. Unchanged tracheostomy tube and left upper extremity PICC line. The heart size and  mediastinal contours are within normal limits. Patchy bilateral airspace opacities are similar to prior study. No pleural effusion or pneumothorax. No acute osseous abnormality. IMPRESSION: 1. Feeding tube entering the duodenum with tip below the field of view. 2. Unchanged bilateral airspace disease. Electronically Signed   By: Titus Dubin M.D.   On: 05/09/2018 17:56   Dg Abd Portable 1v  Result Date: 05/09/2018 CLINICAL DATA:  Impaired nasal gastric feeding tube. EXAM: PORTABLE ABDOMEN - 1 VIEW COMPARISON:  Radiograph May 02, 2018. FINDINGS: Distal tip of feeding tube is seen in expected position of third portion of duodenum. No abnormal bowel dilatation is noted. IMPRESSION: Distal tip of feeding tube seen in expected position of third portion of duodenum. Electronically Signed   By: Marijo Conception, M.D.   On: 05/09/2018 21:22    Assessment/Plan Active Problems:   Acute on chronic respiratory failure with hypoxia (HCC)   COPD, severe (HCC)   Pneumonia due to Pseudomonas aeruginosa (HCC)   Paroxysmal atrial fibrillation (HCC)   Delirium, acute   1. Acute on chronic respiratory failure with hypoxia continue with full vent support at this time.  Continue to wean oxygen as tolerated.  Able to wean FiO2 down to 35% today.  Patient continues to have some agitation. 2. Severe COPD we will continue with current management goal saturation 88 to 92% 3. Health associated pneumonia Pseudomonas, continue present management 4. Paroxysmal atrial fibrillation rate controlled continue to monitor 5. Delirium patient remains a candidate with Versed continue to monitor.   I have personally seen and evaluated the patient, evaluated laboratory and imaging results, formulated the assessment and plan and placed orders. The Patient requires high complexity decision making for assessment and support.  Case was discussed on Rounds with the Respiratory Therapy Staff  Allyne Gee, MD [facility] Pulmonary  Critical Care Medicine Sleep Medicine

## 2018-05-12 ENCOUNTER — Other Ambulatory Visit (HOSPITAL_COMMUNITY): Payer: BLUE CROSS/BLUE SHIELD

## 2018-05-12 LAB — C DIFFICILE QUICK SCREEN W PCR REFLEX??: C Diff antigen: NEGATIVE

## 2018-05-12 LAB — C DIFFICILE QUICK SCREEN W PCR REFLEX
C Diff interpretation: NOT DETECTED
C Diff toxin: NEGATIVE

## 2018-05-12 NOTE — Progress Notes (Addendum)
Pulmonary Critical Care Medicine Kyle Lowery   PULMONARY CRITICAL CARE SERVICE  PROGRESS NOTE  Date of Service: 05/12/2018  TRELYN VANDERLINDE  XLK:[id number]  DOB: [birth year]   DOA: 05/09/2018  Referring Physician: Merton Border, MD  HPI: Kyle Lowery is a 68 y.o. male seen for follow up of Acute on Chronic Respiratory Failure.  Patient was able to do 4 hours of pressure support 12/5 FiO2 28%.  He continues to have a large amount of secretions.  No acute distress is noted.  Medications: Reviewed on Rounds  Physical Exam:  Vitals: Pulse 90 respirations 18 BP 168/70 O2 sat 95% temp 98.1  Ventilator Settings ventilator mode AC PC rate of 10 IP 18 FiO2 35% PEEP 5  . General: Comfortable at this time . Eyes: Grossly normal lids, irises & conjunctiva . ENT: grossly tongue is normal . Neck: no obvious mass . Cardiovascular: S1 S2 normal no gallop . Respiratory: No rales or rhonchi noted . Abdomen: soft . Skin: no rash seen on limited exam . Musculoskeletal: not rigid . Psychiatric:unable to assess . Neurologic: no seizure no involuntary movements         Lab Data:   Basic Metabolic Panel: Recent Labs  Lab 05/07/18 0340 05/08/18 0357 05/09/18 0500 05/10/18 0746 05/11/18 0414  NA 140 137 141 143 143  K 3.1* 4.7 3.3* 4.1 3.8  CL 110 108 109 108 106  CO2 24 25 25 28 28   GLUCOSE 90 448* 131* 145* 108*  BUN 15 17 19 21 23   CREATININE 0.72 0.73 0.80 0.72 0.80  CALCIUM 7.3* 6.9* 7.5* 7.8* 8.1*  MG 1.5* 1.8 1.8 2.0 1.9  PHOS  --   --   --  3.2 2.6    ABG: Recent Labs  Lab 05/11/18 0936  PHART 7.416  PCO2ART 47.3  PO2ART 101  HCO3 30.3*  O2SAT 97.9    Liver Function Tests: Recent Labs  Lab 05/10/18 0746  AST 33  ALT 61*  ALKPHOS 82  BILITOT 0.5  PROT 6.5  ALBUMIN 2.1*   No results for input(s): LIPASE, AMYLASE in the last 168 hours. No results for input(s): AMMONIA in the last 168 hours.  CBC: Recent Labs  Lab 05/06/18 0307  05/08/18 0357 05/09/18 0500 05/10/18 0746 05/11/18 0414  WBC 6.8 5.2 4.9 14.5* 10.2  NEUTROABS  --   --   --  11.0*  --   HGB 8.3* 7.1* 6.8* 10.5* 9.9*  HCT 25.9* 23.7* 21.7* 33.4* 32.2*  MCV 102.0* 105.3* 104.8* 101.8* 101.6*  PLT 184 195 177 PLATELET CLUMPS NOTED ON SMEAR, UNABLE TO ESTIMATE 311    Cardiac Enzymes: No results for input(s): CKTOTAL, CKMB, CKMBINDEX, TROPONINI in the last 168 hours.  BNP (last 3 results) Recent Labs    04/22/18 1430  BNP 433.0*    ProBNP (last 3 results) No results for input(s): PROBNP in the last 8760 hours.  Radiological Exams: Dg Chest Port 1 View  Result Date: 05/12/2018 CLINICAL DATA:  Followup exam.  Pulmonary opacities. EXAM: PORTABLE CHEST 1 VIEW COMPARISON:  05/11/2018 and older exams. FINDINGS: Small focus of airspace opacity in the left upper lobe noted on the previous day's exam has improved. Wedge-shaped area of right upper lobe opacity, bordering the minor fissure, is without change. Bilateral interstitial lung opacities, most prominent in the lung bases, are without change. Probable small right effusion. No new lung abnormalities.  No pneumothorax. The left PICC has been removed since the previous day's study.  Tracheostomy tube and enteric tube are stable. IMPRESSION: 1. Opacity noted in the left upper lobe on the previous day's exam appears improved, although this apparent change may be technical only due to differences in patient positioning. 2. No other evidence of a change. Persistent right upper lobe consolidation. Persistent, basilar predominant, interstitial lung opacities. Small right pleural effusion. 3. Removal of the left PICC.  Remaining support apparatus is stable. Electronically Signed   By: Lajean Manes M.D.   On: 05/12/2018 07:13   Dg Chest Port 1 View  Result Date: 05/11/2018 CLINICAL DATA:  Short of breath, tracheostomy. EXAM: PORTABLE CHEST 1 VIEW COMPARISON:  05/09/2018 FINDINGS: Tracheostomy tube and enteric tube  unchanged. Increased consolidation in the RIGHT upper lobe in a segmental pattern. More subtle consolidation is increased in the LEFT upper lobe is also more prominent. Diffuse bilateral airspace densities also noted. And unchanged mild basilar atelectasis. No pneumothorax. IMPRESSION: 1. Increasing upper lobe airspace consolidation most consistent with progression of multifocal pneumonia. 2. Underlying diffuse airspace disease suggests pulmonary edema or infection. Electronically Signed   By: Suzy Bouchard M.D.   On: 05/11/2018 10:32    Assessment/Plan Active Problems:   Acute on chronic respiratory failure with hypoxia (HCC)   COPD, severe (HCC)   Pneumonia due to Pseudomonas aeruginosa (HCC)   Paroxysmal atrial fibrillation (HCC)   Delirium, acute   1. Acute on chronic respiratory failure with hypoxia continue with full ventilator support at this time.  Continue to wean as tolerated per protocol. 2. Severe COPD continue with current management 3. Healthcare associated pneumonia Pseudomonas continue present management 4. Paradoxical atrial fibrillation rate controlled continue to monitor 5. Delirium patient remains medicated at this time.  Continue to follow   I have personally seen and evaluated the patient, evaluated laboratory and imaging results, formulated the assessment and plan and placed orders. The Patient requires high complexity decision making for assessment and support.  Case was discussed on Rounds with the Respiratory Therapy Staff  Allyne Gee, MD [facility] Pulmonary Critical Care Medicine Sleep Medicine

## 2018-05-13 LAB — BASIC METABOLIC PANEL
Anion gap: 10 (ref 5–15)
BUN: 13 mg/dL (ref 8–23)
CO2: 30 mmol/L (ref 22–32)
Calcium: 7.9 mg/dL — ABNORMAL LOW (ref 8.9–10.3)
Chloride: 100 mmol/L (ref 98–111)
Creatinine, Ser: 0.6 mg/dL — ABNORMAL LOW (ref 0.61–1.24)
GFR calc Af Amer: >60 mL/min (ref >60)
GFR calc non Af Amer: >60 mL/min (ref >60)
Glucose, Bld: 120 mg/dL — ABNORMAL HIGH (ref 70–99)
Potassium: 2.8 mmol/L — ABNORMAL LOW (ref 3.5–5.1)
Sodium: 140 mmol/L (ref 135–145)

## 2018-05-13 LAB — CBC
HCT: 29.9 % — ABNORMAL LOW (ref 39.0–52.0)
Hemoglobin: 9.7 g/dL — ABNORMAL LOW (ref 13.0–17.0)
MCH: 32.3 pg (ref 26.0–34.0)
MCHC: 32.4 g/dL (ref 30.0–36.0)
MCV: 99.7 fL (ref 80.0–100.0)
Platelets: 440 10*3/uL — ABNORMAL HIGH (ref 150–400)
RBC: 3 MIL/uL — ABNORMAL LOW (ref 4.22–5.81)
RDW: 16.4 % — ABNORMAL HIGH (ref 11.5–15.5)
WBC: 8.1 10*3/uL (ref 4.0–10.5)
nRBC: 0.4 % — ABNORMAL HIGH (ref 0.0–0.2)

## 2018-05-13 LAB — POTASSIUM: Potassium: 3.1 mmol/L — ABNORMAL LOW (ref 3.5–5.1)

## 2018-05-13 LAB — MAGNESIUM: Magnesium: 1.7 mg/dL (ref 1.7–2.4)

## 2018-05-13 NOTE — Progress Notes (Addendum)
Pulmonary Critical Care Medicine [place]   PULMONARY CRITICAL CARE SERVICE  PROGRESS NOTE  Date of Service: 05/13/2018  WALTON DIGILIO  BJY:[id number]  DOB: 24-Feb-1950   DOA: 05/09/2018  Referring Physician: Merton Border, MD  HPI: Kyle Lowery is a 68 y.o. male seen for follow up of Acute on Chronic Respiratory Failure.  Patient has an 8-hour goal of pressure support 12/5 FiO2 28%.  Continues to have a large amount of secretions with no acute distress at this time.    Medications: Reviewed on Rounds  Physical Exam:  Vitals: Pulse 92 respirations 18 BP 162/78 O2 sat $0.97 temp 97.6  Ventilator Settings ventilator mode pressure support 12/5 FiO2 28%  . General: Comfortable at this time . Eyes: Grossly normal lids, irises & conjunctiva . ENT: grossly tongue is normal . Neck: no obvious mass . Cardiovascular: S1 S2 normal no gallop . Respiratory: No rales or rhonchi noted . Abdomen: soft . Skin: no rash seen on limited exam . Musculoskeletal: not rigid . Psychiatric:unable to assess . Neurologic: no seizure no involuntary movements         Lab Data:   Basic Metabolic Panel: Recent Labs  Lab 05/08/18 0357 05/09/18 0500 05/10/18 0746 05/11/18 0414 05/13/18 0724 05/13/18 1547  NA 137 141 143 143 140  --   K 4.7 3.3* 4.1 3.8 2.8* 3.1*  CL 108 109 108 106 100  --   CO2 25 25 28 28 30   --   GLUCOSE 448* 131* 145* 108* 120*  --   BUN 17 19 21 23 13   --   CREATININE 0.73 0.80 0.72 0.80 0.60*  --   CALCIUM 6.9* 7.5* 7.8* 8.1* 7.9*  --   MG 1.8 1.8 2.0 1.9 1.7  --   PHOS  --   --  3.2 2.6  --   --     ABG: Recent Labs  Lab 05/11/18 0936  PHART 7.416  PCO2ART 47.3  PO2ART 101  HCO3 30.3*  O2SAT 97.9    Liver Function Tests: Recent Labs  Lab 05/10/18 0746  AST 33  ALT 61*  ALKPHOS 82  BILITOT 0.5  PROT 6.5  ALBUMIN 2.1*   No results for input(s): LIPASE, AMYLASE in the last 168 hours. No results for input(s): AMMONIA in the last  168 hours.  CBC: Recent Labs  Lab 05/08/18 0357 05/09/18 0500 05/10/18 0746 05/11/18 0414 05/13/18 0724  WBC 5.2 4.9 14.5* 10.2 8.1  NEUTROABS  --   --  11.0*  --   --   HGB 7.1* 6.8* 10.5* 9.9* 9.7*  HCT 23.7* 21.7* 33.4* 32.2* 29.9*  MCV 105.3* 104.8* 101.8* 101.6* 99.7  PLT 195 177 PLATELET CLUMPS NOTED ON SMEAR, UNABLE TO ESTIMATE 311 440*    Cardiac Enzymes: No results for input(s): CKTOTAL, CKMB, CKMBINDEX, TROPONINI in the last 168 hours.  BNP (last 3 results) Recent Labs    04/22/18 1430  BNP 433.0*    ProBNP (last 3 results) No results for input(s): PROBNP in the last 8760 hours.  Radiological Exams: Dg Chest Port 1 View  Result Date: 05/12/2018 CLINICAL DATA:  Followup exam.  Pulmonary opacities. EXAM: PORTABLE CHEST 1 VIEW COMPARISON:  05/11/2018 and older exams. FINDINGS: Small focus of airspace opacity in the left upper lobe noted on the previous day's exam has improved. Wedge-shaped area of right upper lobe opacity, bordering the minor fissure, is without change. Bilateral interstitial lung opacities, most prominent in the lung bases, are  without change. Probable small right effusion. No new lung abnormalities.  No pneumothorax. The left PICC has been removed since the previous day's study. Tracheostomy tube and enteric tube are stable. IMPRESSION: 1. Opacity noted in the left upper lobe on the previous day's exam appears improved, although this apparent change may be technical only due to differences in patient positioning. 2. No other evidence of a change. Persistent right upper lobe consolidation. Persistent, basilar predominant, interstitial lung opacities. Small right pleural effusion. 3. Removal of the left PICC.  Remaining support apparatus is stable. Electronically Signed   By: Lajean Manes M.D.   On: 05/12/2018 07:13    Assessment/Plan Active Problems:   Acute on chronic respiratory failure with hypoxia (HCC)   COPD, severe (HCC)   Pneumonia due to  Pseudomonas aeruginosa (HCC)   Paroxysmal atrial fibrillation (HCC)   Delirium, acute   1. Acute on chronic respiratory failure with hypoxia continue with full ventilator support at this time.  Continue to wean as tolerated per protocol 2. Severe COPD continue current management 3. Healthcare associated pneumonia, Pseudomonas continue present management 4. Atrial fibrillation rate controlled continue to monitor 5. Delirium, patient remains medicated at this time continue present management   I have personally seen and evaluated the patient, evaluated laboratory and imaging results, formulated the assessment and plan and placed orders. The Patient requires high complexity decision making for assessment and support.  Case was discussed on Rounds with the Respiratory Therapy Staff  Allyne Gee, MD [facility] Pulmonary Critical Care Medicine Sleep Medicine

## 2018-05-14 LAB — BASIC METABOLIC PANEL
Anion gap: 9 (ref 5–15)
BUN: 12 mg/dL (ref 8–23)
CO2: 30 mmol/L (ref 22–32)
Calcium: 8.1 mg/dL — ABNORMAL LOW (ref 8.9–10.3)
Chloride: 97 mmol/L — ABNORMAL LOW (ref 98–111)
Creatinine, Ser: 0.67 mg/dL (ref 0.61–1.24)
GFR calc Af Amer: >60 mL/min (ref >60)
GFR calc non Af Amer: >60 mL/min (ref >60)
Glucose, Bld: 131 mg/dL — ABNORMAL HIGH (ref 70–99)
Potassium: 2.8 mmol/L — ABNORMAL LOW (ref 3.5–5.1)
Sodium: 136 mmol/L (ref 135–145)

## 2018-05-14 LAB — MAGNESIUM: Magnesium: 2 mg/dL (ref 1.7–2.4)

## 2018-05-14 LAB — PHOSPHORUS: Phosphorus: 3.1 mg/dL (ref 2.5–4.6)

## 2018-05-14 LAB — POTASSIUM: Potassium: 3.8 mmol/L (ref 3.5–5.1)

## 2018-05-14 NOTE — Progress Notes (Addendum)
Pulmonary Critical Care Medicine [place]   PULMONARY CRITICAL CARE SERVICE  PROGRESS NOTE  Date of Service: 05/14/2018  Kyle Lowery  [id number]:[id number]  DOB: 1950-06-14   DOA: 05/09/2018  Referring Physician: Merton Border, MD  HPI: Kyle Lowery is a 68 y.o. male seen for follow up of Acute on Chronic Respiratory Failure.  Patient was able to tolerate 8 hours yesterday on pressure support.  He has a goal of 12 hours today is currently doing well with no acute distress noted.  Medications: Reviewed on Rounds  Physical Exam:  Vitals: Pulse 77 respirations 16 BP 115/64 O2 sat 100 temp 98.0  Ventilator Settings currently on pressure support 12/5 FiO2 28%  . General: Comfortable at this time . Eyes: Grossly normal lids, irises & conjunctiva . ENT: grossly tongue is normal . Neck: no obvious mass . Cardiovascular: S1 S2 normal no gallop . Respiratory: No rales or rhonchi noted . Abdomen: soft . Skin: no rash seen on limited exam . Musculoskeletal: not rigid . Psychiatric:unable to assess . Neurologic: no seizure no involuntary movements         Lab Data:   Basic Metabolic Panel: Recent Labs  Lab 05/09/18 0500 05/10/18 0746 05/11/18 0414 05/13/18 0724 05/13/18 1547 05/14/18 0631  NA 141 143 143 140  --  136  K 3.3* 4.1 3.8 2.8* 3.1* 2.8*  CL 109 108 106 100  --  97*  CO2 25 28 28 30   --  30  GLUCOSE 131* 145* 108* 120*  --  131*  BUN 19 21 23 13   --  12  CREATININE 0.80 0.72 0.80 0.60*  --  0.67  CALCIUM 7.5* 7.8* 8.1* 7.9*  --  8.1*  MG 1.8 2.0 1.9 1.7  --  2.0  PHOS  --  3.2 2.6  --   --  3.1    ABG: Recent Labs  Lab 05/11/18 0936  PHART 7.416  PCO2ART 47.3  PO2ART 101  HCO3 30.3*  O2SAT 97.9    Liver Function Tests: Recent Labs  Lab 05/10/18 0746  AST 33  ALT 61*  ALKPHOS 82  BILITOT 0.5  PROT 6.5  ALBUMIN 2.1*   No results for input(s): LIPASE, AMYLASE in the last 168 hours. No results for input(s): AMMONIA in the  last 168 hours.  CBC: Recent Labs  Lab 05/08/18 0357 05/09/18 0500 05/10/18 0746 05/11/18 0414 05/13/18 0724  WBC 5.2 4.9 14.5* 10.2 8.1  NEUTROABS  --   --  11.0*  --   --   HGB 7.1* 6.8* 10.5* 9.9* 9.7*  HCT 23.7* 21.7* 33.4* 32.2* 29.9*  MCV 105.3* 104.8* 101.8* 101.6* 99.7  PLT 195 177 PLATELET CLUMPS NOTED ON SMEAR, UNABLE TO ESTIMATE 311 440*    Cardiac Enzymes: No results for input(s): CKTOTAL, CKMB, CKMBINDEX, TROPONINI in the last 168 hours.  BNP (last 3 results) Recent Labs    04/22/18 1430  BNP 433.0*    ProBNP (last 3 results) No results for input(s): PROBNP in the last 8760 hours.  Radiological Exams: No results found.  Assessment/Plan Active Problems:   Acute on chronic respiratory failure with hypoxia (HCC)   COPD, severe (HCC)   Pneumonia due to Pseudomonas aeruginosa (HCC)   Paroxysmal atrial fibrillation (HCC)   Delirium, acute   1. Acute on chronic respiratory failure with hypoxia continue with full ventilator support at this time.  Continue weaning as tolerated per protocol. 2. Severe COPD continue current manage 3. Healthcare associated  pneumonia, Pseudomonas continue present therapy 4. Atrial fibrillation troponin monitor 5. Delirium patient remains medicated continue present therapy   I have personally seen and evaluated the patient, evaluated laboratory and imaging results, formulated the assessment and plan and placed orders. The Patient requires high complexity decision making for assessment and support.  Case was discussed on Rounds with the Respiratory Therapy Staff  Allyne Gee, MD [facility] Pulmonary Critical Care Medicine Sleep Medicine

## 2018-05-15 LAB — BASIC METABOLIC PANEL
Anion gap: 11 (ref 5–15)
BUN: 17 mg/dL (ref 8–23)
CO2: 32 mmol/L (ref 22–32)
Calcium: 8.5 mg/dL — ABNORMAL LOW (ref 8.9–10.3)
Chloride: 98 mmol/L (ref 98–111)
Creatinine, Ser: 0.86 mg/dL (ref 0.61–1.24)
GFR calc Af Amer: >60 mL/min (ref >60)
GFR calc non Af Amer: >60 mL/min (ref >60)
Glucose, Bld: 134 mg/dL — ABNORMAL HIGH (ref 70–99)
Potassium: 3.5 mmol/L (ref 3.5–5.1)
Sodium: 141 mmol/L (ref 135–145)

## 2018-05-15 NOTE — Progress Notes (Addendum)
Pulmonary Critical Care Medicine [place]   PULMONARY CRITICAL CARE SERVICE  PROGRESS NOTE  Date of Service: 05/15/2018  Kyle Lowery  [id number]  DOB: [birth year]   DOA: 05/09/2018  Referring Physician: Merton Border, MD  HPI: Kyle Lowery is a 68 y.o. male seen for follow up of Acute on Chronic Respiratory Failure.  Patient was able to tolerate 12 hours yesterday with no distress or issues.  Has a pressure support goal of 16 hours today on 12/5 FiO2 28%.  Currently doing well with no distress.  Medications: Reviewed on Rounds  Physical Exam:  Vitals: Pulse 79 respirations 15 BP 161/75 O2 sat 98% temp 97.9  Ventilator Settings pressure support 12/5 FiO2 28%  . General: Comfortable at this time . Eyes: Grossly normal lids, irises & conjunctiva . ENT: grossly tongue is normal . Neck: no obvious mass . Cardiovascular: S1 S2 normal no gallop . Respiratory: No rales or rhonchi noted . Abdomen: soft . Skin: no rash seen on limited exam . Musculoskeletal: not rigid . Psychiatric:unable to assess . Neurologic: no seizure no involuntary movements         Lab Data:   Basic Metabolic Panel: Recent Labs  Lab 05/09/18 0500 05/10/18 0746 05/11/18 0414 05/13/18 0724 05/13/18 1547 05/14/18 0631 05/14/18 2115 05/15/18 0604  NA 141 143 143 140  --  136  --  141  K 3.3* 4.1 3.8 2.8* 3.1* 2.8* 3.8 3.5  CL 109 108 106 100  --  97*  --  98  CO2 25 28 28 30   --  30  --  32  GLUCOSE 131* 145* 108* 120*  --  131*  --  134*  BUN 19 21 23 13   --  12  --  17  CREATININE 0.80 0.72 0.80 0.60*  --  0.67  --  0.86  CALCIUM 7.5* 7.8* 8.1* 7.9*  --  8.1*  --  8.5*  MG 1.8 2.0 1.9 1.7  --  2.0  --   --   PHOS  --  3.2 2.6  --   --  3.1  --   --     ABG: Recent Labs  Lab 05/11/18 0936  PHART 7.416  PCO2ART 47.3  PO2ART 101  HCO3 30.3*  O2SAT 97.9    Liver Function Tests: Recent Labs  Lab 05/10/18 0746  AST 33  ALT 61*  ALKPHOS 82  BILITOT 0.5   PROT 6.5  ALBUMIN 2.1*   No results for input(s): LIPASE, AMYLASE in the last 168 hours. No results for input(s): AMMONIA in the last 168 hours.  CBC: Recent Labs  Lab 05/09/18 0500 05/10/18 0746 05/11/18 0414 05/13/18 0724  WBC 4.9 14.5* 10.2 8.1  NEUTROABS  --  11.0*  --   --   HGB 6.8* 10.5* 9.9* 9.7*  HCT 21.7* 33.4* 32.2* 29.9*  MCV 104.8* 101.8* 101.6* 99.7  PLT 177 PLATELET CLUMPS NOTED ON SMEAR, UNABLE TO ESTIMATE 311 440*    Cardiac Enzymes: No results for input(s): CKTOTAL, CKMB, CKMBINDEX, TROPONINI in the last 168 hours.  BNP (last 3 results) Recent Labs    04/22/18 1430  BNP 433.0*    ProBNP (last 3 results) No results for input(s): PROBNP in the last 8760 hours.  Radiological Exams: No results found.  Assessment/Plan Active Problems:   Acute on chronic respiratory failure with hypoxia (HCC)   COPD, severe (HCC)   Pneumonia due to Pseudomonas aeruginosa (HCC)   Paroxysmal atrial fibrillation (  [place])   Delirium, acute   1. Acute on chronic respiratory failure with hypoxia continue with pressure support trials attempt to wean ventilator.  Continue secretion management pulmonary toilet. 2. Severe COPD continue current management 3. Healthcare associated pneumonia, Pseudomonas continue present therapy 4. Atrial fibrillation rate controlled 5. Delirium patient remains medically continue present therapy   I have personally seen and evaluated the patient, evaluated laboratory and imaging results, formulated the assessment and plan and placed orders. The Patient requires high complexity decision making for assessment and support.  Case was discussed on Rounds with the Respiratory Therapy Staff  Allyne Gee, MD [facility] Pulmonary Critical Care Medicine Sleep Medicine

## 2018-05-16 NOTE — Progress Notes (Addendum)
Pulmonary Critical Care Medicine [place]   PULMONARY CRITICAL CARE SERVICE  PROGRESS NOTE  Date of Service: 05/16/2018  Kyle Lowery  NWG:[id number]  DOB: 09-Nov-1950   DOA: 05/09/2018  Referring Physician: Merton Border, MD  HPI: Kyle Lowery is a 68 y.o. male seen for follow up of Acute on Chronic Respiratory Failure.  Patient mains on pressure for 2/5 FiO2 28%.  Plan today is for 2 hours on aerosol trach collar 28%.  Medications: Reviewed on Rounds  Physical Exam:  Vitals: Pulse 80 respirations 18 BP 166/73 O2 sat 99% temp 97.4  Ventilator Settings pressure support 12/5 FiO2 28%  . General: Comfortable at this time . Eyes: Grossly normal lids, irises & conjunctiva . ENT: grossly tongue is normal . Neck: no obvious mass . Cardiovascular: S1 S2 normal no gallop . Respiratory: No rales or rhonchi noted . Abdomen: soft . Skin: no rash seen on limited exam . Musculoskeletal: not rigid . Psychiatric:unable to assess . Neurologic: no seizure no involuntary movements         Lab Data:   Basic Metabolic Panel: Recent Labs  Lab 05/10/18 0746 05/11/18 0414 05/13/18 0724 05/13/18 1547 05/14/18 0631 05/14/18 2115 05/15/18 0604  NA 143 143 140  --  136  --  141  K 4.1 3.8 2.8* 3.1* 2.8* 3.8 3.5  CL 108 106 100  --  97*  --  98  CO2 28 28 30   --  30  --  32  GLUCOSE 145* 108* 120*  --  131*  --  134*  BUN 21 23 13   --  12  --  17  CREATININE 0.72 0.80 0.60*  --  0.67  --  0.86  CALCIUM 7.8* 8.1* 7.9*  --  8.1*  --  8.5*  MG 2.0 1.9 1.7  --  2.0  --   --   PHOS 3.2 2.6  --   --  3.1  --   --     ABG: Recent Labs  Lab 05/11/18 0936  PHART 7.416  PCO2ART 47.3  PO2ART 101  HCO3 30.3*  O2SAT 97.9    Liver Function Tests: Recent Labs  Lab 05/10/18 0746  AST 33  ALT 61*  ALKPHOS 82  BILITOT 0.5  PROT 6.5  ALBUMIN 2.1*   No results for input(s): LIPASE, AMYLASE in the last 168 hours. No results for input(s): AMMONIA in the last 168  hours.  CBC: Recent Labs  Lab 05/10/18 0746 05/11/18 0414 05/13/18 0724  WBC 14.5* 10.2 8.1  NEUTROABS 11.0*  --   --   HGB 10.5* 9.9* 9.7*  HCT 33.4* 32.2* 29.9*  MCV 101.8* 101.6* 99.7  PLT PLATELET CLUMPS NOTED ON SMEAR, UNABLE TO ESTIMATE 311 440*    Cardiac Enzymes: No results for input(s): CKTOTAL, CKMB, CKMBINDEX, TROPONINI in the last 168 hours.  BNP (last 3 results) Recent Labs    04/22/18 1430  BNP 433.0*    ProBNP (last 3 results) No results for input(s): PROBNP in the last 8760 hours.  Radiological Exams: No results found.  Assessment/Plan Active Problems:   Acute on chronic respiratory failure with hypoxia (HCC)   COPD, severe (HCC)   Pneumonia due to Pseudomonas aeruginosa (HCC)   Paroxysmal atrial fibrillation (HCC)   Delirium, acute   1. Acute on chronic respiratory failure with hypoxia continue with pressure support trials and move forward with aerosol trach collar trials.  Continue secretion management pulmonary toilet. 2. Severe COPD continue  current management 3. Healthcare associated pneumonia, Pseudomonas continue present therapy 4. Atrial fibrillation rate trolled 5. Delirium continue present   I have personally seen and evaluated the patient, evaluated laboratory and imaging results, formulated the assessment and plan and placed orders. The Patient requires high complexity decision making for assessment and support.  Case was discussed on Rounds with the Respiratory Therapy Staff  Allyne Gee, MD [facility] Pulmonary Critical Care Medicine Sleep Medicine

## 2018-05-17 ENCOUNTER — Other Ambulatory Visit (HOSPITAL_COMMUNITY): Payer: BLUE CROSS/BLUE SHIELD

## 2018-05-17 LAB — BASIC METABOLIC PANEL
Anion gap: 9 (ref 5–15)
BUN: 24 mg/dL — ABNORMAL HIGH (ref 8–23)
CO2: 36 mmol/L — ABNORMAL HIGH (ref 22–32)
Calcium: 8.5 mg/dL — ABNORMAL LOW (ref 8.9–10.3)
Chloride: 95 mmol/L — ABNORMAL LOW (ref 98–111)
Creatinine, Ser: 0.92 mg/dL (ref 0.61–1.24)
GFR calc Af Amer: >60 mL/min (ref >60)
GFR calc non Af Amer: >60 mL/min (ref >60)
Glucose, Bld: 137 mg/dL — ABNORMAL HIGH (ref 70–99)
Potassium: 3.3 mmol/L — ABNORMAL LOW (ref 3.5–5.1)
Sodium: 140 mmol/L (ref 135–145)

## 2018-05-17 LAB — CBC
HCT: 29.2 % — ABNORMAL LOW (ref 39.0–52.0)
Hemoglobin: 8.8 g/dL — ABNORMAL LOW (ref 13.0–17.0)
MCH: 30.7 pg (ref 26.0–34.0)
MCHC: 30.1 g/dL (ref 30.0–36.0)
MCV: 101.7 fL — ABNORMAL HIGH (ref 80.0–100.0)
Platelets: 494 10*3/uL — ABNORMAL HIGH (ref 150–400)
RBC: 2.87 MIL/uL — ABNORMAL LOW (ref 4.22–5.81)
RDW: 16.5 % — ABNORMAL HIGH (ref 11.5–15.5)
WBC: 10.2 10*3/uL (ref 4.0–10.5)
nRBC: 0.2 % (ref 0.0–0.2)

## 2018-05-17 LAB — MAGNESIUM: Magnesium: 2 mg/dL (ref 1.7–2.4)

## 2018-05-17 NOTE — Progress Notes (Addendum)
Pulmonary Critical Care Medicine [place]   PULMONARY CRITICAL CARE SERVICE  PROGRESS NOTE  Date of Service: 05/17/2018  Kyle Lowery  OVF:[id number]  DOB: 09/27/1950   DOA: 05/09/2018  Referring Physician: Merton Border, MD  HPI: Kyle Lowery is a 68 y.o. male seen for follow up of Acute on Chronic Respiratory Failure.  Patient was successful in doing 2 hours on aerosol trach collar yesterday 28% FiO2.  Patient goal today is 8 hours and is currently doing very well.  O2 saturation is good no distress is noted.  Medications: Reviewed on Rounds  Physical Exam:  Vitals: 74 respirations 18 BP 149/79 O2 sat 95% temp is 97.9  Ventilator Settings patient's not currently on ventilator  . General: Comfortable at this time . Eyes: Grossly normal lids, irises & conjunctiva . ENT: grossly tongue is normal . Neck: no obvious mass . Cardiovascular: S1 S2 normal no gallop . Respiratory: No rales or rhonchi noted . Abdomen: soft . Skin: no rash seen on limited exam . Musculoskeletal: not rigid . Psychiatric:unable to assess . Neurologic: no seizure no involuntary movements         Lab Data:   Basic Metabolic Panel: Recent Labs  Lab 05/11/18 0414 05/13/18 0724 05/13/18 1547 05/14/18 0631 05/14/18 2115 05/15/18 0604 05/17/18 0625  NA 143 140  --  136  --  141 140  K 3.8 2.8* 3.1* 2.8* 3.8 3.5 3.3*  CL 106 100  --  97*  --  98 95*  CO2 28 30  --  30  --  32 36*  GLUCOSE 108* 120*  --  131*  --  134* 137*  BUN 23 13  --  12  --  17 24*  CREATININE 0.80 0.60*  --  0.67  --  0.86 0.92  CALCIUM 8.1* 7.9*  --  8.1*  --  8.5* 8.5*  MG 1.9 1.7  --  2.0  --   --  2.0  PHOS 2.6  --   --  3.1  --   --   --     ABG: Recent Labs  Lab 05/11/18 0936  PHART 7.416  PCO2ART 47.3  PO2ART 101  HCO3 30.3*  O2SAT 97.9    Liver Function Tests: No results for input(s): AST, ALT, ALKPHOS, BILITOT, PROT, ALBUMIN in the last 168 hours. No results for input(s):  LIPASE, AMYLASE in the last 168 hours. No results for input(s): AMMONIA in the last 168 hours.  CBC: Recent Labs  Lab 05/11/18 0414 05/13/18 0724 05/17/18 0625  WBC 10.2 8.1 10.2  HGB 9.9* 9.7* 8.8*  HCT 32.2* 29.9* 29.2*  MCV 101.6* 99.7 101.7*  PLT 311 440* 494*    Cardiac Enzymes: No results for input(s): CKTOTAL, CKMB, CKMBINDEX, TROPONINI in the last 168 hours.  BNP (last 3 results) Recent Labs    04/22/18 1430  BNP 433.0*    ProBNP (last 3 results) No results for input(s): PROBNP in the last 8760 hours.  Radiological Exams: Dg Chest Port 1 View  Result Date: 05/17/2018 CLINICAL DATA:  Acute on chronic respiratory failure. Increased secretions from the patient's tracheostomy tube. EXAM: PORTABLE CHEST 1 VIEW COMPARISON:  Single-view of the chest 05/12/2018 and 05/11/2018. FINDINGS: Tracheostomy tube and feeding tube remain in place. Airspace disease in the right mid and lower lung zones persists but appears improved. Small foci of airspace opacity in the left lung are unchanged. Heart size is normal. Aortic atherosclerosis noted. No pneumothorax or  pleural effusion. IMPRESSION: Patchy airspace disease in the right chest appears improved compared to the most recent study. No new abnormality. Electronically Signed   By: Inge Rise M.D.   On: 05/17/2018 13:59    Assessment/Plan Active Problems:   Acute on chronic respiratory failure with hypoxia (HCC)   COPD, severe (HCC)   Pneumonia due to Pseudomonas aeruginosa (HCC)   Paroxysmal atrial fibrillation (HCC)   Delirium, acute   1. Acute on chronic respiratory failure with hypoxia continue with her support when not doing aerosol trach collar trials.  Continue secretion management and pulmonary toilet.  Goal today is 8 hours on aerosol trach collar. 2. Severe COPD continue management 3. Healthcare associated pneumonia, Pseudomonas continue present therapy 4. Atrial fibrillation rate controlled 5. Delirium continue  present management   I have personally seen and evaluated the patient, evaluated laboratory and imaging results, formulated the assessment and plan and placed orders. The Patient requires high complexity decision making for assessment and support.  Case was discussed on Rounds with the Respiratory Therapy Staff  Allyne Gee, MD [facility] Pulmonary Critical Care Medicine Sleep Medicine

## 2018-05-18 LAB — POTASSIUM: Potassium: 3.5 mmol/L (ref 3.5–5.1)

## 2018-05-18 NOTE — Progress Notes (Addendum)
Pulmonary Critical Care Medicine [place]   PULMONARY CRITICAL CARE SERVICE  PROGRESS NOTE  Date of Service: 05/18/2018  JHASE CREPPEL  FIE:[id number]  DOB: 1950/05/29   DOA: 05/09/2018  Referring Physician: Merton Border, MD  HPI: Kyle Lowery is a 68 y.o. male seen for follow up of Acute on Chronic Respiratory Failure.  Patient has an 8 to 12-hour goal on aerosol trach collar today.  Currently doing well with this will likely make it close to 12 hours.  Medications: Reviewed on Rounds  Physical Exam:  Vitals: Pulse 78 respirations 10 BP 145/72 O2 sat 96% temp 97.1  Ventilator Settings not currently on ventilator  . General: Comfortable at this time . Eyes: Grossly normal lids, irises & conjunctiva . ENT: grossly tongue is normal . Neck: no obvious mass . Cardiovascular: S1 S2 normal no gallop . Respiratory: No rales or rhonchi noted . Abdomen: soft . Skin: no rash seen on limited exam . Musculoskeletal: not rigid . Psychiatric:unable to assess . Neurologic: no seizure no involuntary movements         Lab Data:   Basic Metabolic Panel: Recent Labs  Lab 05/13/18 0724  05/14/18 0631 05/14/18 2115 05/15/18 0604 05/17/18 0625 05/18/18 0550  NA 140  --  136  --  141 140  --   K 2.8*   < > 2.8* 3.8 3.5 3.3* 3.5  CL 100  --  97*  --  98 95*  --   CO2 30  --  30  --  32 36*  --   GLUCOSE 120*  --  131*  --  134* 137*  --   BUN 13  --  12  --  17 24*  --   CREATININE 0.60*  --  0.67  --  0.86 0.92  --   CALCIUM 7.9*  --  8.1*  --  8.5* 8.5*  --   MG 1.7  --  2.0  --   --  2.0  --   PHOS  --   --  3.1  --   --   --   --    < > = values in this interval not displayed.    ABG: No results for input(s): PHART, PCO2ART, PO2ART, HCO3, O2SAT in the last 168 hours.  Liver Function Tests: No results for input(s): AST, ALT, ALKPHOS, BILITOT, PROT, ALBUMIN in the last 168 hours. No results for input(s): LIPASE, AMYLASE in the last 168 hours. No  results for input(s): AMMONIA in the last 168 hours.  CBC: Recent Labs  Lab 05/13/18 0724 05/17/18 0625  WBC 8.1 10.2  HGB 9.7* 8.8*  HCT 29.9* 29.2*  MCV 99.7 101.7*  PLT 440* 494*    Cardiac Enzymes: No results for input(s): CKTOTAL, CKMB, CKMBINDEX, TROPONINI in the last 168 hours.  BNP (last 3 results) Recent Labs    04/22/18 1430  BNP 433.0*    ProBNP (last 3 results) No results for input(s): PROBNP in the last 8760 hours.  Radiological Exams: Dg Chest Port 1 View  Result Date: 05/17/2018 CLINICAL DATA:  Acute on chronic respiratory failure. Increased secretions from the patient's tracheostomy tube. EXAM: PORTABLE CHEST 1 VIEW COMPARISON:  Single-view of the chest 05/12/2018 and 05/11/2018. FINDINGS: Tracheostomy tube and feeding tube remain in place. Airspace disease in the right mid and lower lung zones persists but appears improved. Small foci of airspace opacity in the left lung are unchanged. Heart size is normal. Aortic atherosclerosis  noted. No pneumothorax or pleural effusion. IMPRESSION: Patchy airspace disease in the right chest appears improved compared to the most recent study. No new abnormality. Electronically Signed   By: Inge Rise M.D.   On: 05/17/2018 13:59    Assessment/Plan Active Problems:   Acute on chronic respiratory failure with hypoxia (HCC)   COPD, severe (HCC)   Pneumonia due to Pseudomonas aeruginosa (HCC)   Paroxysmal atrial fibrillation (HCC)   Delirium, acute   1. Acute on chronic respiratory failure hypoxia continue aerosol trach collar trials and progress with weaning as tolerated.  Goal today to 12 hours on aerosol trach collar. 2. Severe COPD continue present management 3. Healthcare associated pneumonia, Pseudomonas continues on therapy 4. Atrial fibrillation rate controlled 5. Delirium continue present management   I have personally seen and evaluated the patient, evaluated laboratory and imaging results, formulated the  assessment and plan and placed orders. The Patient requires high complexity decision making for assessment and support.  Case was discussed on Rounds with the Respiratory Therapy Staff  Allyne Gee, MD [facility] Pulmonary Critical Care Medicine Sleep Medicine

## 2018-05-19 NOTE — Progress Notes (Addendum)
Pulmonary Critical Care Medicine [place]   PULMONARY CRITICAL CARE SERVICE  PROGRESS NOTE  Date of Service: 05/19/2018  DEMETRIC DUNNAWAY  GDJ:[id number]  DOB: December 12, 1950   DOA: 05/09/2018  Referring Physician: Merton Border, MD  HPI: Kyle Lowery is a 68 y.o. male seen for follow up of Acute on Chronic Respiratory Failure.  Patient has a 16-hour goal today on aerosol trach collar 28% FiO2.  Continues to have a moderate amount of secretions.  Remains afebrile.  Medications: Reviewed on Rounds  Physical Exam:  Vitals: Pulse 78 respirations 18 BP 164/59 O2 sat 100% temp 97.6  Ventilator Settings not currently on ventilator  . General: Comfortable at this time . Eyes: Grossly normal lids, irises & conjunctiva . ENT: grossly tongue is normal . Neck: no obvious mass . Cardiovascular: S1 S2 normal no gallop . Respiratory: No rales or rhonchi noted . Abdomen: soft . Skin: no rash seen on limited exam . Musculoskeletal: not rigid . Psychiatric:unable to assess . Neurologic: no seizure no involuntary movements         Lab Data:   Basic Metabolic Panel: Recent Labs  Lab 05/13/18 0724  05/14/18 0631 05/14/18 2115 05/15/18 0604 05/17/18 0625 05/18/18 0550  NA 140  --  136  --  141 140  --   K 2.8*   < > 2.8* 3.8 3.5 3.3* 3.5  CL 100  --  97*  --  98 95*  --   CO2 30  --  30  --  32 36*  --   GLUCOSE 120*  --  131*  --  134* 137*  --   BUN 13  --  12  --  17 24*  --   CREATININE 0.60*  --  0.67  --  0.86 0.92  --   CALCIUM 7.9*  --  8.1*  --  8.5* 8.5*  --   MG 1.7  --  2.0  --   --  2.0  --   PHOS  --   --  3.1  --   --   --   --    < > = values in this interval not displayed.    ABG: No results for input(s): PHART, PCO2ART, PO2ART, HCO3, O2SAT in the last 168 hours.  Liver Function Tests: No results for input(s): AST, ALT, ALKPHOS, BILITOT, PROT, ALBUMIN in the last 168 hours. No results for input(s): LIPASE, AMYLASE in the last 168 hours. No  results for input(s): AMMONIA in the last 168 hours.  CBC: Recent Labs  Lab 05/13/18 0724 05/17/18 0625  WBC 8.1 10.2  HGB 9.7* 8.8*  HCT 29.9* 29.2*  MCV 99.7 101.7*  PLT 440* 494*    Cardiac Enzymes: No results for input(s): CKTOTAL, CKMB, CKMBINDEX, TROPONINI in the last 168 hours.  BNP (last 3 results) Recent Labs    04/22/18 1430  BNP 433.0*    ProBNP (last 3 results) No results for input(s): PROBNP in the last 8760 hours.  Radiological Exams: No results found.  Assessment/Plan Active Problems:   Acute on chronic respiratory failure with hypoxia (HCC)   COPD, severe (HCC)   Pneumonia due to Pseudomonas aeruginosa (HCC)   Paroxysmal atrial fibrillation (HCC)   Delirium, acute   1. Acute on chronic respiratory failure with hypoxia continue trach collar trials and progress with weaning as tolerated.  Goal today is 16 hours.  Continue pulmonary toilet and secretion management 2. Severe COPD continue present management 3.  Healthcare associated pneumonia with Pseudomonas continue present therapy 4. Atrial fibrillation rate controlled 5. Delirium continue present management   I have personally seen and evaluated the patient, evaluated laboratory and imaging results, formulated the assessment and plan and placed orders. The Patient requires high complexity decision making for assessment and support.  Case was discussed on Rounds with the Respiratory Therapy Staff  Allyne Gee, MD [facility] Pulmonary Critical Care Medicine Sleep Medicine

## 2018-05-20 LAB — BASIC METABOLIC PANEL
Anion gap: 12 (ref 5–15)
BUN: 34 mg/dL — ABNORMAL HIGH (ref 8–23)
CO2: 34 mmol/L — ABNORMAL HIGH (ref 22–32)
Calcium: 8.5 mg/dL — ABNORMAL LOW (ref 8.9–10.3)
Chloride: 97 mmol/L — ABNORMAL LOW (ref 98–111)
Creatinine, Ser: 1.21 mg/dL (ref 0.61–1.24)
GFR calc Af Amer: >60 mL/min (ref >60)
GFR calc non Af Amer: >60 mL/min (ref >60)
Glucose, Bld: 118 mg/dL — ABNORMAL HIGH (ref 70–99)
Potassium: 3.3 mmol/L — ABNORMAL LOW (ref 3.5–5.1)
Sodium: 143 mmol/L (ref 135–145)

## 2018-05-20 LAB — CBC
HCT: 29.2 % — ABNORMAL LOW (ref 39.0–52.0)
Hemoglobin: 9.1 g/dL — ABNORMAL LOW (ref 13.0–17.0)
MCH: 31.7 pg (ref 26.0–34.0)
MCHC: 31.2 g/dL (ref 30.0–36.0)
MCV: 101.7 fL — ABNORMAL HIGH (ref 80.0–100.0)
Platelets: 416 10*3/uL — ABNORMAL HIGH (ref 150–400)
RBC: 2.87 MIL/uL — ABNORMAL LOW (ref 4.22–5.81)
RDW: 16.2 % — ABNORMAL HIGH (ref 11.5–15.5)
WBC: 9.1 10*3/uL (ref 4.0–10.5)
nRBC: 0 % (ref 0.0–0.2)

## 2018-05-20 LAB — MAGNESIUM: Magnesium: 2.1 mg/dL (ref 1.7–2.4)

## 2018-05-20 LAB — CULTURE, RESPIRATORY W GRAM STAIN

## 2018-05-20 LAB — CULTURE, RESPIRATORY

## 2018-05-20 NOTE — Progress Notes (Addendum)
Pulmonary Critical Care Medicine [place]   PULMONARY CRITICAL CARE SERVICE  PROGRESS NOTE  Date of Service: 05/20/2018  Kyle Lowery  NTZ:[id number]  DOB: [birth year]   DOA: 05/09/2018  Referring Physician: Merton Border, MD  HPI: Kyle Lowery is a 68 y.o. male seen for follow up of Acute on Chronic Respiratory Failure.  Patient is a 20-hour goal today on aerosol trach collar at 28% FiO2.  Currently doing well with no distress.  Medications: Reviewed on Rounds  Physical Exam:  Vitals: Pulse 64 respirations 23 BP 56/84 O2 sat 97% temp 98.1  Ventilator Settings not currently on ventilator  . General: Comfortable at this time . Eyes: Grossly normal lids, irises & conjunctiva . ENT: grossly tongue is normal . Neck: no obvious mass . Cardiovascular: S1 S2 normal no gallop . Respiratory: No rales or rhonchi noted . Abdomen: soft . Skin: no rash seen on limited exam . Musculoskeletal: not rigid . Psychiatric:unable to assess . Neurologic: no seizure no involuntary movements         Lab Data:   Basic Metabolic Panel: Recent Labs  Lab 05/14/18 0631 05/14/18 2115 05/15/18 0604 05/17/18 0625 05/18/18 0550 05/20/18 0727  NA 136  --  141 140  --  143  K 2.8* 3.8 3.5 3.3* 3.5 3.3*  CL 97*  --  98 95*  --  97*  CO2 30  --  32 36*  --  34*  GLUCOSE 131*  --  134* 137*  --  118*  BUN 12  --  17 24*  --  34*  CREATININE 0.67  --  0.86 0.92  --  1.21  CALCIUM 8.1*  --  8.5* 8.5*  --  8.5*  MG 2.0  --   --  2.0  --  2.1  PHOS 3.1  --   --   --   --   --     ABG: No results for input(s): PHART, PCO2ART, PO2ART, HCO3, O2SAT in the last 168 hours.  Liver Function Tests: No results for input(s): AST, ALT, ALKPHOS, BILITOT, PROT, ALBUMIN in the last 168 hours. No results for input(s): LIPASE, AMYLASE in the last 168 hours. No results for input(s): AMMONIA in the last 168 hours.  CBC: Recent Labs  Lab 05/17/18 0625 05/20/18 0727  WBC 10.2 9.1   HGB 8.8* 9.1*  HCT 29.2* 29.2*  MCV 101.7* 101.7*  PLT 494* 416*    Cardiac Enzymes: No results for input(s): CKTOTAL, CKMB, CKMBINDEX, TROPONINI in the last 168 hours.  BNP (last 3 results) Recent Labs    04/22/18 1430  BNP 433.0*    ProBNP (last 3 results) No results for input(s): PROBNP in the last 8760 hours.  Radiological Exams: No results found.  Assessment/Plan Active Problems:   Acute on chronic respiratory failure with hypoxia (HCC)   COPD, severe (HCC)   Pneumonia due to Pseudomonas aeruginosa (HCC)   Paroxysmal atrial fibrillation (HCC)   Delirium, acute   1. Acute on chronic respiratory failure hypoxia continue trach collar trials and progress with weaning as ordered.  Goal today is 20 hours.  Continue pulmonary toilet and secretion management 2. Severe COPD continue present management 3. Healthcare associated pneumonia with Pseudomonas continue present management 4. Atrial fibrillation rate controlled 5. Delirium continue present management   I have personally seen and evaluated the patient, evaluated laboratory and imaging results, formulated the assessment and plan and placed orders. The Patient requires high complexity decision making  for assessment and support.  Case was discussed on Rounds with the Respiratory Therapy Staff  Allyne Gee, MD [facility] Pulmonary Critical Care Medicine Sleep Medicine

## 2018-05-21 LAB — BASIC METABOLIC PANEL
Anion gap: 10 (ref 5–15)
BUN: 33 mg/dL — ABNORMAL HIGH (ref 8–23)
CO2: 34 mmol/L — ABNORMAL HIGH (ref 22–32)
Calcium: 8.3 mg/dL — ABNORMAL LOW (ref 8.9–10.3)
Chloride: 99 mmol/L (ref 98–111)
Creatinine, Ser: 1.24 mg/dL (ref 0.61–1.24)
GFR calc Af Amer: >60 mL/min (ref >60)
GFR calc non Af Amer: >60 mL/min (ref >60)
Glucose, Bld: 118 mg/dL — ABNORMAL HIGH (ref 70–99)
Potassium: 3.4 mmol/L — ABNORMAL LOW (ref 3.5–5.1)
Sodium: 143 mmol/L (ref 135–145)

## 2018-05-21 NOTE — Progress Notes (Addendum)
Pulmonary Critical Care Medicine [place]   PULMONARY CRITICAL CARE SERVICE  PROGRESS NOTE  Date of Service: 05/21/2018  Kyle LIBERATORE  Lowery:[id number]  DOB: September 04, 1950   DOA: 05/09/2018  Referring Physician: Merton Border, MD  HPI: Kyle Lowery is a 68 y.o. male seen for follow up of Acute on Chronic Respiratory Failure.  Patient has a 20-hour aerosol trach collar goal today on 28% FiO2.  Currently doing well with no distress at this time.  Medications: Reviewed on Rounds  Physical Exam:  Vitals: Pulse 74 respirations 18 BP 133/63 O2 sat 97% temp 97.2  Ventilator Settings ATC 28%  . General: Comfortable at this time . Eyes: Grossly normal lids, irises & conjunctiva . ENT: grossly tongue is normal . Neck: no obvious mass . Cardiovascular: S1 S2 normal no gallop . Respiratory: No rales or rhonchi noted . Abdomen: soft . Skin: no rash seen on limited exam . Musculoskeletal: not rigid . Psychiatric:unable to assess . Neurologic: no seizure no involuntary movements         Lab Data:   Basic Metabolic Panel: Recent Labs  Lab 05/15/18 0604 05/17/18 0625 05/18/18 0550 05/20/18 0727 05/21/18 0556  NA 141 140  --  143 143  K 3.5 3.3* 3.5 3.3* 3.4*  CL 98 95*  --  97* 99  CO2 32 36*  --  34* 34*  GLUCOSE 134* 137*  --  118* 118*  BUN 17 24*  --  34* 33*  CREATININE 0.86 0.92  --  1.21 1.24  CALCIUM 8.5* 8.5*  --  8.5* 8.3*  MG  --  2.0  --  2.1  --     ABG: No results for input(s): PHART, PCO2ART, PO2ART, HCO3, O2SAT in the last 168 hours.  Liver Function Tests: No results for input(s): AST, ALT, ALKPHOS, BILITOT, PROT, ALBUMIN in the last 168 hours. No results for input(s): LIPASE, AMYLASE in the last 168 hours. No results for input(s): AMMONIA in the last 168 hours.  CBC: Recent Labs  Lab 05/17/18 0625 05/20/18 0727  WBC 10.2 9.1  HGB 8.8* 9.1*  HCT 29.2* 29.2*  MCV 101.7* 101.7*  PLT 494* 416*    Cardiac Enzymes: No results  for input(s): CKTOTAL, CKMB, CKMBINDEX, TROPONINI in the last 168 hours.  BNP (last 3 results) Recent Labs    04/22/18 1430  BNP 433.0*    ProBNP (last 3 results) No results for input(s): PROBNP in the last 8760 hours.  Radiological Exams: No results found.  Assessment/Plan Active Problems:   Acute on chronic respiratory failure with hypoxia (HCC)   COPD, severe (HCC)   Pneumonia due to Pseudomonas aeruginosa (HCC)   Paroxysmal atrial fibrillation (HCC)   Delirium, acute   1. Acute on chronic respiratory failure with hypoxia continue trach collar trials with a goal of 20 hours today.  Continue aggressive pulmonary toilet and secretion management 2. Severe COPD continue present management 3. Healthcare associated pneumonia with Pseudomonas continue present management 4. Atrial fibrillation rate controlled 5. Delirium continue present management   I have personally seen and evaluated the patient, evaluated laboratory and imaging results, formulated the assessment and plan and placed orders. The Patient requires high complexity decision making for assessment and support.  Case was discussed on Rounds with the Respiratory Therapy Staff  Allyne Gee, MD [facility] Pulmonary Critical Care Medicine Sleep Medicine

## 2018-05-22 LAB — RENAL FUNCTION PANEL
Albumin: 2.1 g/dL — ABNORMAL LOW (ref 3.5–5.0)
Anion gap: 12 (ref 5–15)
BUN: 39 mg/dL — ABNORMAL HIGH (ref 8–23)
CO2: 31 mmol/L (ref 22–32)
Calcium: 8.2 mg/dL — ABNORMAL LOW (ref 8.9–10.3)
Chloride: 99 mmol/L (ref 98–111)
Creatinine, Ser: 1.33 mg/dL — ABNORMAL HIGH (ref 0.61–1.24)
GFR calc Af Amer: >60 mL/min (ref >60)
GFR calc non Af Amer: 55 mL/min — ABNORMAL LOW (ref >60)
Glucose, Bld: 120 mg/dL — ABNORMAL HIGH (ref 70–99)
Phosphorus: 4.4 mg/dL (ref 2.5–4.6)
Potassium: 4.3 mmol/L (ref 3.5–5.1)
Sodium: 142 mmol/L (ref 135–145)

## 2018-05-22 LAB — CBC
HCT: 26.7 % — ABNORMAL LOW (ref 39.0–52.0)
Hemoglobin: 8.2 g/dL — ABNORMAL LOW (ref 13.0–17.0)
MCH: 31.3 pg (ref 26.0–34.0)
MCHC: 30.7 g/dL (ref 30.0–36.0)
MCV: 101.9 fL — ABNORMAL HIGH (ref 80.0–100.0)
Platelets: 355 10*3/uL (ref 150–400)
RBC: 2.62 MIL/uL — ABNORMAL LOW (ref 4.22–5.81)
RDW: 16.3 % — ABNORMAL HIGH (ref 11.5–15.5)
WBC: 9.2 10*3/uL (ref 4.0–10.5)
nRBC: 0 % (ref 0.0–0.2)

## 2018-05-22 LAB — MAGNESIUM: Magnesium: 2 mg/dL (ref 1.7–2.4)

## 2018-05-22 NOTE — Progress Notes (Addendum)
Pulmonary Critical Care Medicine [place]   PULMONARY CRITICAL CARE SERVICE  PROGRESS NOTE  Date of Service: 05/22/2018  Kyle Lowery  GUY:[id number]  DOB: 10/12/1950   DOA: 05/09/2018  Referring Physician: Merton Border, MD  HPI: Kyle Lowery is a 68 y.o. male seen for follow up of Acute on Chronic Respiratory Failure.  Patient has a 24-hour goal today on aerosol trach collar 28%.  Using PMV without difficulty.  No distress noted at this time.  Medications: Reviewed on Rounds  Physical Exam:  Vitals: Pulse 99 respirations 18 BP 142/73 O2 sat 98% 7.4  Ventilator Settings ATC 28%  . General: Comfortable at this time . Eyes: Grossly normal lids, irises & conjunctiva . ENT: grossly tongue is normal . Neck: no obvious mass . Cardiovascular: S1 S2 normal no gallop . Respiratory: No rales or rhonchi noted . Abdomen: soft . Skin: no rash seen on limited exam . Musculoskeletal: not rigid . Psychiatric:unable to assess . Neurologic: no seizure no involuntary movements         Lab Data:   Basic Metabolic Panel: Recent Labs  Lab 05/17/18 0625 05/18/18 0550 05/20/18 0727 05/21/18 0556 05/22/18 0744  NA 140  --  143 143 142  K 3.3* 3.5 3.3* 3.4* 4.3  CL 95*  --  97* 99 99  CO2 36*  --  34* 34* 31  GLUCOSE 137*  --  118* 118* 120*  BUN 24*  --  34* 33* 39*  CREATININE 0.92  --  1.21 1.24 1.33*  CALCIUM 8.5*  --  8.5* 8.3* 8.2*  MG 2.0  --  2.1  --  2.0  PHOS  --   --   --   --  4.4    ABG: No results for input(s): PHART, PCO2ART, PO2ART, HCO3, O2SAT in the last 168 hours.  Liver Function Tests: Recent Labs  Lab 05/22/18 0744  ALBUMIN 2.1*   No results for input(s): LIPASE, AMYLASE in the last 168 hours. No results for input(s): AMMONIA in the last 168 hours.  CBC: Recent Labs  Lab 05/17/18 0625 05/20/18 0727 05/22/18 0744  WBC 10.2 9.1 9.2  HGB 8.8* 9.1* 8.2*  HCT 29.2* 29.2* 26.7*  MCV 101.7* 101.7* 101.9*  PLT 494* 416* 355     Cardiac Enzymes: No results for input(s): CKTOTAL, CKMB, CKMBINDEX, TROPONINI in the last 168 hours.  BNP (last 3 results) Recent Labs    04/22/18 1430  BNP 433.0*    ProBNP (last 3 results) No results for input(s): PROBNP in the last 8760 hours.  Radiological Exams: No results found.  Assessment/Plan Active Problems:   Acute on chronic respiratory failure with hypoxia (HCC)   COPD, severe (HCC)   Pneumonia due to Pseudomonas aeruginosa (HCC)   Paroxysmal atrial fibrillation (HCC)   Delirium, acute   1. Acute on chronic respiratory failure with hypoxia continue trach collar trials with a goal 24 hours today.  Continue ventilator and secretion management 2. Severe COPD continue present management 3. Healthcare associated pneumonia with Pseudomonas continue present management 4. Atrial fibrillation rate controlled 5. Delirium continue present management   I have personally seen and evaluated the patient, evaluated laboratory and imaging results, formulated the assessment and plan and placed orders. The Patient requires high complexity decision making for assessment and support.  Case was discussed on Rounds with the Respiratory Therapy Staff  Allyne Gee, MD [facility] Pulmonary Critical Care Medicine Sleep Medicine

## 2018-05-23 NOTE — Progress Notes (Addendum)
Pulmonary Critical Care Medicine [place]   PULMONARY CRITICAL CARE SERVICE  PROGRESS NOTE  Date of Service: 05/23/2018  Kyle Lowery  MPN:[id number]  DOB: [birth year]   DOA: 05/09/2018  Referring Physician: Merton Border, MD  HPI: Kyle Lowery is a 68 y.o. male seen for follow up of Acute on Chronic Respiratory Failure.  Patient has a 48-hour goal on aerosol trach collar 21% FiO2.  He is using his PMV without difficulty.  No distress noted at this time.  Medications: Reviewed on Rounds  Physical Exam:  Vitals: Pulse 72 respirations 14 BP 161/84 O2 sat 97% 97.8  Ventilator Settings AC 21%  . General: Comfortable at this time . Eyes: Grossly normal lids, irises & conjunctiva . ENT: grossly tongue is normal . Neck: no obvious mass . Cardiovascular: S1 S2 normal no gallop . Respiratory: No rales rhonchi noted . Abdomen: soft . Skin: no rash seen on limited exam . Musculoskeletal: not rigid . Psychiatric:unable to assess . Neurologic: no seizure no involuntary movements         Lab Data:   Basic Metabolic Panel: Recent Labs  Lab 05/17/18 0625 05/18/18 0550 05/20/18 0727 05/21/18 0556 05/22/18 0744  NA 140  --  143 143 142  K 3.3* 3.5 3.3* 3.4* 4.3  CL 95*  --  97* 99 99  CO2 36*  --  34* 34* 31  GLUCOSE 137*  --  118* 118* 120*  BUN 24*  --  34* 33* 39*  CREATININE 0.92  --  1.21 1.24 1.33*  CALCIUM 8.5*  --  8.5* 8.3* 8.2*  MG 2.0  --  2.1  --  2.0  PHOS  --   --   --   --  4.4    ABG: No results for input(s): PHART, PCO2ART, PO2ART, HCO3, O2SAT in the last 168 hours.  Liver Function Tests: Recent Labs  Lab 05/22/18 0744  ALBUMIN 2.1*   No results for input(s): LIPASE, AMYLASE in the last 168 hours. No results for input(s): AMMONIA in the last 168 hours.  CBC: Recent Labs  Lab 05/17/18 0625 05/20/18 0727 05/22/18 0744  WBC 10.2 9.1 9.2  HGB 8.8* 9.1* 8.2*  HCT 29.2* 29.2* 26.7*  MCV 101.7* 101.7* 101.9*  PLT 494* 416*  355    Cardiac Enzymes: No results for input(s): CKTOTAL, CKMB, CKMBINDEX, TROPONINI in the last 168 hours.  BNP (last 3 results) Recent Labs    04/22/18 1430  BNP 433.0*    ProBNP (last 3 results) No results for input(s): PROBNP in the last 8760 hours.  Radiological Exams: No results found.  Assessment/Plan Active Problems:   Acute on chronic respiratory failure with hypoxia (HCC)   COPD, severe (HCC)   Pneumonia due to Pseudomonas aeruginosa (HCC)   Paroxysmal atrial fibrillation (HCC)   Delirium, acute   1. Acute on chronic respiratory failure with hypoxia continue trach collar trials with a goal of 48 hours at this time.  Continue secretion management and pulmonary toilet 2. Severe COPD continue present regimen 3. Healthcare associated pneumonia with Pseudomonas continue present management 4. Atrial fibrillation rate controlled 5. Delirium continue present management   I have personally seen and evaluated the patient, evaluated laboratory and imaging results, formulated the assessment and plan and placed orders. The Patient requires high complexity decision making for assessment and support.  Case was discussed on Rounds with the Respiratory Therapy Staff  Allyne Gee, MD [facility] Pulmonary Critical Care Medicine Sleep Medicine

## 2018-05-24 LAB — RENAL FUNCTION PANEL
Albumin: 2.4 g/dL — ABNORMAL LOW (ref 3.5–5.0)
Anion gap: 11 (ref 5–15)
BUN: 26 mg/dL — ABNORMAL HIGH (ref 8–23)
CO2: 29 mmol/L (ref 22–32)
Calcium: 8.6 mg/dL — ABNORMAL LOW (ref 8.9–10.3)
Chloride: 102 mmol/L (ref 98–111)
Creatinine, Ser: 1.09 mg/dL (ref 0.61–1.24)
GFR calc Af Amer: >60 mL/min (ref >60)
GFR calc non Af Amer: >60 mL/min (ref >60)
Glucose, Bld: 102 mg/dL — ABNORMAL HIGH (ref 70–99)
Phosphorus: 4.3 mg/dL (ref 2.5–4.6)
Potassium: 3.6 mmol/L (ref 3.5–5.1)
Sodium: 142 mmol/L (ref 135–145)

## 2018-05-24 LAB — CBC
HCT: 28.3 % — ABNORMAL LOW (ref 39.0–52.0)
Hemoglobin: 8.5 g/dL — ABNORMAL LOW (ref 13.0–17.0)
MCH: 31.1 pg (ref 26.0–34.0)
MCHC: 30 g/dL (ref 30.0–36.0)
MCV: 103.7 fL — ABNORMAL HIGH (ref 80.0–100.0)
Platelets: 325 10*3/uL (ref 150–400)
RBC: 2.73 MIL/uL — ABNORMAL LOW (ref 4.22–5.81)
RDW: 15.9 % — ABNORMAL HIGH (ref 11.5–15.5)
WBC: 8.7 10*3/uL (ref 4.0–10.5)
nRBC: 0 % (ref 0.0–0.2)

## 2018-05-24 LAB — MAGNESIUM: Magnesium: 2 mg/dL (ref 1.7–2.4)

## 2018-05-24 NOTE — Progress Notes (Addendum)
Pulmonary Critical Care Medicine [place]   PULMONARY CRITICAL CARE SERVICE  PROGRESS NOTE  Date of Service: 05/24/2018  Kyle Lowery  XIP:[id number]  DOB: Jul 06, 1950   DOA: 05/09/2018  Referring Physician: Merton Border, MD  HPI: Kyle Lowery is a 68 y.o. male seen for follow up of Acute on Chronic Respiratory Failure.  Patient has now been 72 hours on aerosol trach collar at 21% FiO2.  Using PMV without difficulty.  Satting well with no distress.  Medications: Reviewed on Rounds  Physical Exam:  Vitals: Pulse 79 respirations 20 BP 154/75 O2 sat 98% temp 97.9  Ventilator Settings ATC 21%  . General: Comfortable at this time . Eyes: Grossly normal lids, irises & conjunctiva . ENT: grossly tongue is normal . Neck: no obvious mass . Cardiovascular: S1 S2 normal no gallop . Respiratory: No rales or rhonchi noted . Abdomen: soft . Skin: no rash seen on limited exam . Musculoskeletal: not rigid . Psychiatric:unable to assess . Neurologic: no seizure no involuntary movements         Lab Data:   Basic Metabolic Panel: Recent Labs  Lab 05/18/18 0550 05/20/18 0727 05/21/18 0556 05/22/18 0744 05/24/18 0542  NA  --  143 143 142 142  K 3.5 3.3* 3.4* 4.3 3.6  CL  --  97* 99 99 102  CO2  --  34* 34* 31 29  GLUCOSE  --  118* 118* 120* 102*  BUN  --  34* 33* 39* 26*  CREATININE  --  1.21 1.24 1.33* 1.09  CALCIUM  --  8.5* 8.3* 8.2* 8.6*  MG  --  2.1  --  2.0 2.0  PHOS  --   --   --  4.4 4.3    ABG: No results for input(s): PHART, PCO2ART, PO2ART, HCO3, O2SAT in the last 168 hours.  Liver Function Tests: Recent Labs  Lab 05/22/18 0744 05/24/18 0542  ALBUMIN 2.1* 2.4*   No results for input(s): LIPASE, AMYLASE in the last 168 hours. No results for input(s): AMMONIA in the last 168 hours.  CBC: Recent Labs  Lab 05/20/18 0727 05/22/18 0744 05/24/18 0542  WBC 9.1 9.2 8.7  HGB 9.1* 8.2* 8.5*  HCT 29.2* 26.7* 28.3*  MCV 101.7* 101.9*  103.7*  PLT 416* 355 325    Cardiac Enzymes: No results for input(s): CKTOTAL, CKMB, CKMBINDEX, TROPONINI in the last 168 hours.  BNP (last 3 results) Recent Labs    04/22/18 1430  BNP 433.0*    ProBNP (last 3 results) No results for input(s): PROBNP in the last 8760 hours.  Radiological Exams: No results found.  Assessment/Plan Active Problems:   Acute on chronic respiratory failure with hypoxia (HCC)   COPD, severe (HCC)   Pneumonia due to Pseudomonas aeruginosa (HCC)   Paroxysmal atrial fibrillation (HCC)   Delirium, acute   1. Acute on chronic respiratory failure with hypoxia continue trach collar trials with a goal of 72 hours at this time.  Continue secretion management pulmonary toilet 2. Severe COPD continue present therapy 3. Healthcare associated pneumonia with Pseudomonas continue present management 4. Atrial fibrillation rate controlled 5. Delirium continue present therapy   I have personally seen and evaluated the patient, evaluated laboratory and imaging results, formulated the assessment and plan and placed orders. The Patient requires high complexity decision making for assessment and support.  Case was discussed on Rounds with the Respiratory Therapy Staff  Allyne Gee, MD [facility] Pulmonary Critical Care Medicine Sleep Medicine

## 2018-05-25 ENCOUNTER — Other Ambulatory Visit (HOSPITAL_COMMUNITY): Payer: BLUE CROSS/BLUE SHIELD

## 2018-05-25 NOTE — Progress Notes (Addendum)
Pulmonary Critical Care Medicine [place]   PULMONARY CRITICAL CARE SERVICE  PROGRESS NOTE  Date of Service: 05/25/2018  Kyle Lowery  DUK:[id number]  DOB: June 05, 1950   DOA: 05/09/2018  Referring Physician: Merton Border, MD  HPI: Kyle Lowery is a 68 y.o. male seen for follow up of Acute on Chronic Respiratory Failure.  Patient passed swallow study today doing well continues on aerosol trach collar 28% with good saturations.  Medications: Reviewed on Rounds  Physical Exam:  Vitals: Pulse 88 respirations 20 BP 104/84 O2 sat 99% temp 97.8  Ventilator Settings ATC 21%  . General: Comfortable at this time . Eyes: Grossly normal lids, irises & conjunctiva . ENT: grossly tongue is normal . Neck: no obvious mass . Cardiovascular: S1 S2 normal no gallop . Respiratory: No rales or rhonchi noted . Abdomen: soft . Skin: no rash seen on limited exam . Musculoskeletal: not rigid . Psychiatric:unable to assess . Neurologic: no seizure no involuntary movements         Lab Data:   Basic Metabolic Panel: Recent Labs  Lab 05/20/18 0727 05/21/18 0556 05/22/18 0744 05/24/18 0542  NA 143 143 142 142  K 3.3* 3.4* 4.3 3.6  CL 97* 99 99 102  CO2 34* 34* 31 29  GLUCOSE 118* 118* 120* 102*  BUN 34* 33* 39* 26*  CREATININE 1.21 1.24 1.33* 1.09  CALCIUM 8.5* 8.3* 8.2* 8.6*  MG 2.1  --  2.0 2.0  PHOS  --   --  4.4 4.3    ABG: No results for input(s): PHART, PCO2ART, PO2ART, HCO3, O2SAT in the last 168 hours.  Liver Function Tests: Recent Labs  Lab 05/22/18 0744 05/24/18 0542  ALBUMIN 2.1* 2.4*   No results for input(s): LIPASE, AMYLASE in the last 168 hours. No results for input(s): AMMONIA in the last 168 hours.  CBC: Recent Labs  Lab 05/20/18 0727 05/22/18 0744 05/24/18 0542  WBC 9.1 9.2 8.7  HGB 9.1* 8.2* 8.5*  HCT 29.2* 26.7* 28.3*  MCV 101.7* 101.9* 103.7*  PLT 416* 355 325    Cardiac Enzymes: No results for input(s): CKTOTAL, CKMB,  CKMBINDEX, TROPONINI in the last 168 hours.  BNP (last 3 results) Recent Labs    04/22/18 1430  BNP 433.0*    ProBNP (last 3 results) No results for input(s): PROBNP in the last 8760 hours.  Radiological Exams: No results found.  Assessment/Plan Active Problems:   Acute on chronic respiratory failure with hypoxia (HCC)   COPD, severe (HCC)   Pneumonia due to Pseudomonas aeruginosa (HCC)   Paroxysmal atrial fibrillation (HCC)   Delirium, acute   1. Acute on chronic respiratory failure with hypoxia continue trach collar trials.  Continue aggressive pulmonary toilet and supportive measures at this time. 2. Severe COPD continue present therapy 3. Healthcare associated pneumonia with Pseudomonas continue present management 4. Atrial fibrillation rate controlled 5. Delirium continue present therapy   I have personally seen and evaluated the patient, evaluated laboratory and imaging results, formulated the assessment and plan and placed orders. The Patient requires high complexity decision making for assessment and support.  Case was discussed on Rounds with the Respiratory Therapy Staff  Allyne Gee, MD [facility] Pulmonary Critical Care Medicine Sleep Medicine

## 2018-05-26 NOTE — Progress Notes (Addendum)
Pulmonary Critical Care Medicine [place]   PULMONARY CRITICAL CARE SERVICE  PROGRESS NOTE  Date of Service: 05/26/2018  Kyle Lowery  XMI:[id number]  DOB: 06/27/1950   DOA: 05/09/2018  Referring Physician: Merton Border, MD  HPI: Kyle Lowery is a 68 y.o. male seen for follow up of Acute on Chronic Respiratory Failure.  Patient continues on aerosol 28% FiO2 using PMV without difficulty.  Will begin capping trials today.  Medications: Reviewed on Rounds  Physical Exam:  Vitals: Pulse 76 respirations 20 BP 127/63 O2 sat 98% temp 96.9  Ventilator Settings AC 28%  . General: Comfortable at this time . Eyes: Grossly normal lids, irises & conjunctiva . ENT: grossly tongue is normal . Neck: no obvious mass . Cardiovascular: S1 S2 normal no gallop . Respiratory: No rales or rhonchi noted . Abdomen: soft . Skin: no rash seen on limited exam . Musculoskeletal: not rigid . Psychiatric:unable to assess . Neurologic: no seizure no involuntary movements         Lab Data:   Basic Metabolic Panel: Recent Labs  Lab 05/20/18 0727 05/21/18 0556 05/22/18 0744 05/24/18 0542  NA 143 143 142 142  K 3.3* 3.4* 4.3 3.6  CL 97* 99 99 102  CO2 34* 34* 31 29  GLUCOSE 118* 118* 120* 102*  BUN 34* 33* 39* 26*  CREATININE 1.21 1.24 1.33* 1.09  CALCIUM 8.5* 8.3* 8.2* 8.6*  MG 2.1  --  2.0 2.0  PHOS  --   --  4.4 4.3    ABG: No results for input(s): PHART, PCO2ART, PO2ART, HCO3, O2SAT in the last 168 hours.  Liver Function Tests: Recent Labs  Lab 05/22/18 0744 05/24/18 0542  ALBUMIN 2.1* 2.4*   No results for input(s): LIPASE, AMYLASE in the last 168 hours. No results for input(s): AMMONIA in the last 168 hours.  CBC: Recent Labs  Lab 05/20/18 0727 05/22/18 0744 05/24/18 0542  WBC 9.1 9.2 8.7  HGB 9.1* 8.2* 8.5*  HCT 29.2* 26.7* 28.3*  MCV 101.7* 101.9* 103.7*  PLT 416* 355 325    Cardiac Enzymes: No results for input(s): CKTOTAL, CKMB,  CKMBINDEX, TROPONINI in the last 168 hours.  BNP (last 3 results) Recent Labs    04/22/18 1430  BNP 433.0*    ProBNP (last 3 results) No results for input(s): PROBNP in the last 8760 hours.  Radiological Exams: No results found.  Assessment/Plan Active Problems:   Acute on chronic respiratory failure with hypoxia (HCC)   COPD, severe (HCC)   Pneumonia due to Pseudomonas aeruginosa (HCC)   Paroxysmal atrial fibrillation (HCC)   Delirium, acute   1. Acute on chronic respiratory with hypoxia continue trach collar trials as tolerated.  Continue supportive measures as well as pulmonary toilet and secretion management 2. Severe COPD continue present therapy 3. Healthcare associated pneumonia with Pseudomonas continue present management 4. Atrial fibrillation rate trolled 5. Delirium continue present therapy   I have personally seen and evaluated the patient, evaluated laboratory and imaging results, formulated the assessment and plan and placed orders. The Patient requires high complexity decision making for assessment and support.  Case was discussed on Rounds with the Respiratory Therapy Staff  Allyne Gee, MD [facility] Pulmonary Critical Care Medicine Sleep Medicine

## 2018-05-27 NOTE — Progress Notes (Addendum)
Pulmonary Critical Care Medicine [place]   PULMONARY CRITICAL CARE SERVICE  PROGRESS NOTE  Date of Service: 05/27/2018  AXL RODINO  OEV:[id number]  DOB: [birth year]   DOA: 05/09/2018  Referring Physician: Merton Border, MD  HPI: JWAN HORNBAKER is a 68 y.o. male seen for follow up of Acute on Chronic Respiratory Failure.  Patient is now 24 hours capped and on room air.  No distress noted with good saturations.  Medications: Reviewed on Rounds  Physical Exam:  Vitals: Pulse 67 respirations 18 BP 163/70 O2 sat 90% temp 97.1  Ventilator Settings ATC 28%  . General: Comfortable at this time . Eyes: Grossly normal lids, irises & conjunctiva . ENT: grossly tongue is normal . Neck: no obvious mass . Cardiovascular: S1 S2 normal no gallop . Respiratory: No rales or rhonchi noted . Abdomen: soft . Skin: no rash seen on limited exam . Musculoskeletal: not rigid . Psychiatric:unable to assess . Neurologic: no seizure no involuntary movements         Lab Data:   Basic Metabolic Panel: Recent Labs  Lab 05/21/18 0556 05/22/18 0744 05/24/18 0542  NA 143 142 142  K 3.4* 4.3 3.6  CL 99 99 102  CO2 34* 31 29  GLUCOSE 118* 120* 102*  BUN 33* 39* 26*  CREATININE 1.24 1.33* 1.09  CALCIUM 8.3* 8.2* 8.6*  MG  --  2.0 2.0  PHOS  --  4.4 4.3    ABG: No results for input(s): PHART, PCO2ART, PO2ART, HCO3, O2SAT in the last 168 hours.  Liver Function Tests: Recent Labs  Lab 05/22/18 0744 05/24/18 0542  ALBUMIN 2.1* 2.4*   No results for input(s): LIPASE, AMYLASE in the last 168 hours. No results for input(s): AMMONIA in the last 168 hours.  CBC: Recent Labs  Lab 05/22/18 0744 05/24/18 0542  WBC 9.2 8.7  HGB 8.2* 8.5*  HCT 26.7* 28.3*  MCV 101.9* 103.7*  PLT 355 325    Cardiac Enzymes: No results for input(s): CKTOTAL, CKMB, CKMBINDEX, TROPONINI in the last 168 hours.  BNP (last 3 results) Recent Labs    04/22/18 1430  BNP 433.0*     ProBNP (last 3 results) No results for input(s): PROBNP in the last 8760 hours.  Radiological Exams: No results found.  Assessment/Plan Active Problems:   Acute on chronic respiratory failure with hypoxia (HCC)   COPD, severe (HCC)   Pneumonia due to Pseudomonas aeruginosa (HCC)   Paroxysmal atrial fibrillation (HCC)   Delirium, acute   1. Acute on chronic respiratory failure with hypoxia continue trach collar as tolerated.  Continue pulmonary toilet and secretion management 2. Severe COPD continue present therapy 3. Healthcare associated pneumonia with Pseudomonas continue present therapy 4. Atrial fibrillation rate controlled 5. Delirium continue present therapy   I have personally seen and evaluated the patient, evaluated laboratory and imaging results, formulated the assessment and plan and placed orders. The Patient requires high complexity decision making for assessment and support.  Case was discussed on Rounds with the Respiratory Therapy Staff  Allyne Gee, MD [facility] Pulmonary Critical Care Medicine Sleep Medicine

## 2018-05-28 NOTE — PMR Pre-admission (Signed)
PMR Admission Coordinator Pre-Admission Assessment  Patient: Kyle Lowery is an 68 y.o., male MRN: [id number] DOB: 1950-05-23 Height:  '5\' 8"'$  Weight:  63.4kg  Insurance Information HMO:     PPO: yes     PCP:      IPA:      80/20:      OTHER:  PRIMARY: [employer] [place]      Policy#: [id number]      Subscriber: patient's wife CM Name: Ailene Ravel      Phone#: [phone]     Fax#:  Pre-Cert#: [id number] updates due on 5/6 to Discharge Services via fax [phone]     Employer:  Benefits:  Phone #: [phone]     Name:  Eff. Date: 04/08/2018     Deduct: $1400 (met $1400)      Out of Pocket Max: [phone] (met [phone])      Life Max: n/a CIR: 70%      SNF: 70% Outpatient: 70%     Co-Pay: 30% (or $35-$75 dependent upon services) Home Health: 70%      Co-Pay: 30% DME: 70%     Co-Pay: 30% Providers: in network SECONDARY: Medicare Part A and B      Policy#: [id number]     Subscriber: patient CM Name:       Phone#:      Fax#:  Pre-Cert#: verified via Industrial/product designer,      Employer:  Benefits:  Phone #:      Name:  Eff. Date:  part a eligible on 06/08/2003, Part B eligible on 09/07/2004      Deduct: $1408       Out of Pocket Max:       Life Max:  CIR:       SNF:  Outpatient:      Co-Pay:  Home Health:       Co-Pay:  DME:      Co-Pay:   Medicaid Application Date:       Case Manager:  Disability Application Date:       Case Worker:   The "Data Collection Information Summary" for patients in Inpatient Rehabilitation Facilities with attached "Privacy Act [place] Records" was provided and verbally reviewed with: N/A  Emergency Contact Information Contact Information    Name Relation Home Work Mobile   [name] Spouse [phone]     [name],[name]  [phone]     [name] Daughter   [phone]      Current Medical History  Patient Admitting Diagnosis: debility 2/2 acute on chronic respiratory failure with hypoxia  History of Present Illness: pt is a 68 y/o  male with PMH significant for CAD, COPD, and depression, who presented to ED in [place] with c/o flulike symptoms 2 weeks in duration.  Pt had seen PCP who prescribed a Z-Pak, which the patient took without resolution of symptoms.  He was prescribed Tamiflu in the hospital in [place] and discharged after 2 days.  Pt did have contact with 2 grandchildren who tested positive for influenza.  Pt presented to [facility] ED on 3/15 with persistent flu like symptoms and A-fib with RVR, hypokalemia, hypoxia, and dyspnea.  CXR showing bilateral infiltrates and pt requiring intubation so was transferred to [facility].  Pt with tracheostomy on 05/01/2018.  Transitioned to [facility] on 05/09/2018.  Pt has been downsized to size 6 cuffless trach, and began successful capping trials on 05/26/2018.  Pt remains capped, safety mitts on to prevent pt from pulling lines 2/2 confusion.  Recommendations were made for inpatient rehab to maximize independence prior to returning home.      Patient's medical record from [facility] has been reviewed by the rehabilitation admission coordinator and physician.  Past Medical History  Past Medical History:  Diagnosis Date  . Acute on chronic respiratory failure with hypoxia ([place])   . Carotid artery stenosis   . COPD (chronic obstructive pulmonary disease) ([place])   . COPD, severe ([place])   . Coronary atherosclerosis of native coronary artery    Mild to moderate NOCAD with 80% ostial diagonal 1/12  . Delirium, acute   . Depression    Prior suicide attempt  . Essential hypertension, benign   . GERD (gastroesophageal reflux disease)    Esophageal dilatation  . Mixed hyperlipidemia   . Paroxysmal atrial fibrillation (HCC)   . Pneumonia due to Pseudomonas aeruginosa ([place])   . Syncope    Neurocardiogenic    Family History   family history includes Depression in his father; Hypercholesterolemia in his father; Hypertension in his brother, father, and mother; Melanoma  in his mother.  Prior Rehab/Hospitalizations Has the patient had prior rehab or hospitalizations prior to admission? No  Has the patient had major surgery during 100 days prior to admission? Yes, tracheostomy and PEG tube placement   Current Medications No current facility-administered medications for this encounter.   Patients Current Diet:  Diet Order            DIET DYS 3 Room service appropriate? Yes with Assist; Fluid consistency: Nectar Thick  Diet effective now              Precautions / Restrictions Precautions Precaution Comments: 64m cuffless trach, capped since 4/18 Restrictions Weight Bearing Restrictions: No   Has the patient had 2 or more falls or a fall with injury in the past year? No  Prior Activity Level Limited Community (1-2x/wk): limited community ambulator, does not drive  Prior Functional Level Self Care: Did the patient need help bathing, dressing, using the toilet or eating? Independent  Indoor Mobility: Did the patient need assistance with walking from room to room (with or without device)? Independent  Stairs: Did the patient need assistance with internal or external stairs (with or without device)? Independent  Functional Cognition: Did the patient need help planning regular tasks such as shopping or remembering to take medications? Independent  Home Assistive Devices / Equipment    Prior Device Use: Indicate devices/aids used by the patient prior to current illness, exacerbation or injury? None of the above  Current Functional Level Cognition  Overall Cognitive Status: Impaired/Different from baseline Orientation Level: Other (comment)    Extremity Assessment (includes Sensation/Coordination)   generalized weakness in BUEs and BLEs       ADLs    CGA/min assist for bathing/dressing   Mobility    min assist overall, 40' with RW   Transfers    min assist   Ambulation / Gait / Stairs / Wheelchair Mobility  Ambulation/Gait Gait  Distance (Feet): 40 Feet General Gait Details: min assist with RW    Posture / Balance      Special needs/care consideration BiPAP/CPAP no CPM no Continuous Drip IV no Dialysis no        Days n/a Life Vest no Oxygen no Special Bed no Trach Size 640m cuffless, capped on 05/26/2018 Wound Vac (area) no      Location n/a Skin MASD on groin/penis  Bowel mgmt: incontinent, last BM 05/28/2018 Bladder mgmt: incontinent Diabetic mgmt: no Behavioral consideration no Chemo/radiation no   Previous Home Environment (from acute therapy documentation) Living Arrangements: Spouse/significant other  Lives With: Spouse Available Help at Discharge: Family, Available 24 hours/day, Friend(s) Type of Home: House Home Layout: One level Home Access: Stairs to enter Entrance Stairs-Rails: Can reach both Entrance Stairs-Number of Steps: 4 Bathroom Shower/Tub: Chiropodist: Standard Bathroom Accessibility: Yes How Accessible: Accessible via walker [place]: No  Discharge Living Setting Plans for Discharge Living Setting: Patient's home Type of Home at Discharge: House Discharge Home Layout: One level Discharge Home Access: Stairs to enter Entrance Stairs-Rails: Can reach both Entrance Stairs-Number of Steps: 4 Discharge Bathroom Shower/Tub: Tub/shower unit Discharge Bathroom Toilet: Standard Discharge Bathroom Accessibility: Yes How Accessible: Accessible via walker  Social/Family/Support Systems Patient Roles: Spouse Anticipated Caregiver: wife, Baldo Ash Anticipated Ambulance person Information: [phone] Ability/Limitations of Caregiver: works nights, but has arranged for friends to stay with pt while she's at work Caregiver Availability: 24/7 Discharge Plan Discussed with Primary Caregiver: Yes Is Caregiver In Agreement with Plan?: Yes  Goals/Additional Needs Patient/Family Goal for Rehab: PT/OT/SLP supervision Expected  length of stay: 10-14 days Dietary Needs: dys 3 heart healthy, thin Equipment Needs: tbd Pt/Family Agrees to Admission and willing to participate: Yes Program Orientation Provided & Reviewed with Pt/Caregiver Including Roles  & Responsibilities: Yes   Possible need for SNF placement upon discharge: not anticipated  Patient Condition: I have reviewed medical records from [facility], spoken with CM, and patient and spouse. I met with patient at the bedside and discussed with wife via phone for inpatient rehabilitation assessment.  Patient will benefit from ongoing PT, OT and SLP, can actively participate in 3 hours of therapy a day 5 days of the week, and can make measurable gains during the admission.  Patient will also benefit from the coordinated team approach during an Inpatient Acute Rehabilitation admission.  The patient will receive intensive therapy as well as Rehabilitation physician, nursing, social worker, and care management interventions.  Due to bladder management, bowel management, safety, skin/wound care, disease management, medication administration, pain management and patient education the patient requires 24 hour a day rehabilitation nursing.  The patient is currently min with mobility and basic ADLs.  Discharge setting and therapy post discharge at home with home health is anticipated.  Patient has agreed to participate in the Acute Inpatient Rehabilitation Program and will admit today.  Preadmission Screen Completed By:  Michel Santee, 05/30/2018 11:03 AM ______________________________________________________________________   Discussed status with Dr. Posey Pronto on 05/30/18  at 11:03 AM  and received approval for admission today.  Admission Coordinator:  Michel Santee, PT, time 11:03 AM Sudie Grumbling 05/30/18    Assessment/Plan: Diagnosis: debility   1. Does the need for close, 24 hr/day Medical supervision in concert with the patient's rehab needs make it unreasonable  for this patient to be served in a less intensive setting? Potentially  2. Co-Morbidities requiring supervision/potential complications:  CAD, COPD, and depression 3. Due to safety, disease management and patient education, does the patient require 24 hr/day rehab nursing? Potentially 4. Does the patient require coordinated care of a physician, rehab nurse, PT (1-2 hrs/day, 5 days/week) and OT (1-2 hrs/day, 5 days/week) to address physical and functional deficits in the context of the above medical diagnosis(es)? Yes Addressing deficits in the following areas: balance, endurance, locomotion, strength, transferring, bathing, dressing, toileting and psychosocial support 5. Can the patient actively participate  in an intensive therapy program of at least 3 hrs of therapy 5 days a week? Yes 6. The potential for patient to make measurable gains while on inpatient rehab is excellent 7. Anticipated functional outcomes upon discharge from inpatients are: supervision PT, supervision OT, n/a SLP 8. Estimated rehab length of stay to reach the above functional goals is: 8-11 days. 9. Anticipated D/C setting: Home 10. Anticipated post D/C treatments: HH therapy and Home excercise program 11. Overall Rehab/Functional Prognosis: excellent  MD Signature: Delice Lesch, MD, ABPMR

## 2018-05-28 NOTE — Progress Notes (Addendum)
Pulmonary Critical Care Medicine [place]   PULMONARY CRITICAL CARE SERVICE  PROGRESS NOTE  Date of Service: 05/28/2018  [name]  YPP:[id number]  DOB: 04/14/1950   DOA: 05/09/2018  Referring Physician: Merton Border, MD  HPI: Kyle Lowery is a 68 y.o. male seen for follow up of Acute on Chronic Respiratory Failure. Patient has been capped since yesterday.  Patient has malodorous sputum and sputum culture was sent.  Patient is satting well with no distress at this time.  Medications: Reviewed on Rounds  Physical Exam:  Vitals: Pulse 80 respirations 20 BP 146/76 O2 sat 97%  Ventilator Settings where  . General: Comfortable at this time . Eyes: Grossly normal lids, irises & conjunctiva . ENT: grossly tongue is normal . Neck: no obvious mass . Cardiovascular: S1 S2 normal no gallop . Respiratory: No rales or rhonchi noted . Abdomen: soft . Skin: no rash seen on limited exam . Musculoskeletal: not rigid . Psychiatric:unable to assess . Neurologic: no seizure no involuntary movements         Lab Data:   Basic Metabolic Panel: Recent Labs  Lab 05/22/18 0744 05/24/18 0542  NA 142 142  K 4.3 3.6  CL 99 102  CO2 31 29  GLUCOSE 120* 102*  BUN 39* 26*  CREATININE 1.33* 1.09  CALCIUM 8.2* 8.6*  MG 2.0 2.0  PHOS 4.4 4.3    ABG: No results for input(s): PHART, PCO2ART, PO2ART, HCO3, O2SAT in the last 168 hours.  Liver Function Tests: Recent Labs  Lab 05/22/18 0744 05/24/18 0542  ALBUMIN 2.1* 2.4*   No results for input(s): LIPASE, AMYLASE in the last 168 hours. No results for input(s): AMMONIA in the last 168 hours.  CBC: Recent Labs  Lab 05/22/18 0744 05/24/18 0542  WBC 9.2 8.7  HGB 8.2* 8.5*  HCT 26.7* 28.3*  MCV 101.9* 103.7*  PLT 355 325    Cardiac Enzymes: No results for input(s): CKTOTAL, CKMB, CKMBINDEX, TROPONINI in the last 168 hours.  BNP (last 3 results) Recent Labs    04/22/18 1430  BNP 433.0*     ProBNP (last 3 results) No results for input(s): PROBNP in the last 8760 hours.  Radiological Exams: No results found.  Assessment/Plan Active Problems:   Acute on chronic respiratory failure with hypoxia (HCC)   COPD, severe (HCC)   Pneumonia due to Pseudomonas aeruginosa (HCC)   Paroxysmal atrial fibrillation (HCC)   Delirium, acute   1. Acute respiratory failure with hypoxia continue capping trials at this time.  Continue pulmonary toilet secretion management as well as supportive measures 2. Severe COPD continue present therapy 3. Healthcare associated pneumonia with Pseudomonas continue present therapy 4. Atrial fibrillation rate controlled 5. Delirium continue present therapy   I have personally seen and evaluated the patient, evaluated laboratory and imaging results, formulated the assessment and plan and placed orders. The Patient requires high complexity decision making for assessment and support.  Case was discussed on Rounds with the Respiratory Therapy Staff  Allyne Gee, MD [facility] Pulmonary Critical Care Medicine Sleep Medicine

## 2018-05-29 LAB — BASIC METABOLIC PANEL
Anion gap: 12 (ref 5–15)
BUN: 22 mg/dL (ref 8–23)
CO2: 25 mmol/L (ref 22–32)
Calcium: 8.7 mg/dL — ABNORMAL LOW (ref 8.9–10.3)
Chloride: 104 mmol/L (ref 98–111)
Creatinine, Ser: 1.09 mg/dL (ref 0.61–1.24)
GFR calc Af Amer: >60 mL/min (ref >60)
GFR calc non Af Amer: >60 mL/min (ref >60)
Glucose, Bld: 95 mg/dL (ref 70–99)
Potassium: 3.5 mmol/L (ref 3.5–5.1)
Sodium: 141 mmol/L (ref 135–145)

## 2018-05-29 LAB — CBC
HCT: 29.4 % — ABNORMAL LOW (ref 39.0–52.0)
Hemoglobin: 9.1 g/dL — ABNORMAL LOW (ref 13.0–17.0)
MCH: 31.4 pg (ref 26.0–34.0)
MCHC: 31 g/dL (ref 30.0–36.0)
MCV: 101.4 fL — ABNORMAL HIGH (ref 80.0–100.0)
Platelets: 317 10*3/uL (ref 150–400)
RBC: 2.9 MIL/uL — ABNORMAL LOW (ref 4.22–5.81)
RDW: 16.2 % — ABNORMAL HIGH (ref 11.5–15.5)
WBC: 10.3 10*3/uL (ref 4.0–10.5)
nRBC: 0 % (ref 0.0–0.2)

## 2018-05-29 LAB — VANCOMYCIN, TROUGH: Vancomycin Tr: <4 ug/mL — ABNORMAL LOW (ref 15–20)

## 2018-05-29 LAB — MAGNESIUM: Magnesium: 2 mg/dL (ref 1.7–2.4)

## 2018-05-29 NOTE — Progress Notes (Addendum)
Pulmonary Critical Care Medicine [place]   PULMONARY CRITICAL CARE SERVICE  PROGRESS NOTE  Date of Service: 05/29/2018  DUFF POZZI  TSV:[id number]  DOB: [birth year]   DOA: 05/09/2018  Referring Physician: Merton Border, MD  HPI: Kyle Lowery is a 68 y.o. male seen for follow up of Acute on Chronic Respiratory Failure.  Patient has not been capped for 72 hours with minimal secretions.  Plan is to decannulate patient tomorrow if he continues to do well overnight.  Medications: Reviewed on Rounds  Physical Exam:  Vitals: Pulse 83 respirations 18 BP 187/84 O2 sat 90% temp 98.5  Ventilator Settings not currently on ventilator  . General: Comfortable at this time . Eyes: Grossly normal lids, irises & conjunctiva . ENT: grossly tongue is normal . Neck: no obvious mass . Cardiovascular: S1 S2 normal no gallop . Respiratory: No rales or rhonchi noted . Abdomen: soft . Skin: no rash seen on limited exam . Musculoskeletal: not rigid . Psychiatric:unable to assess . Neurologic: no seizure no involuntary movements         Lab Data:   Basic Metabolic Panel: Recent Labs  Lab 05/24/18 0542 05/29/18 0558  NA 142 141  K 3.6 3.5  CL 102 104  CO2 29 25  GLUCOSE 102* 95  BUN 26* 22  CREATININE 1.09 1.09  CALCIUM 8.6* 8.7*  MG 2.0 2.0  PHOS 4.3  --     ABG: No results for input(s): PHART, PCO2ART, PO2ART, HCO3, O2SAT in the last 168 hours.  Liver Function Tests: Recent Labs  Lab 05/24/18 0542  ALBUMIN 2.4*   No results for input(s): LIPASE, AMYLASE in the last 168 hours. No results for input(s): AMMONIA in the last 168 hours.  CBC: Recent Labs  Lab 05/24/18 0542 05/29/18 0558  WBC 8.7 10.3  HGB 8.5* 9.1*  HCT 28.3* 29.4*  MCV 103.7* 101.4*  PLT 325 317    Cardiac Enzymes: No results for input(s): CKTOTAL, CKMB, CKMBINDEX, TROPONINI in the last 168 hours.  BNP (last 3 results) Recent Labs    04/22/18 1430  BNP 433.0*     ProBNP (last 3 results) No results for input(s): PROBNP in the last 8760 hours.  Radiological Exams: No results found.  Assessment/Plan Active Problems:   Acute on chronic respiratory failure with hypoxia (HCC)   COPD, severe (HCC)   Pneumonia due to Pseudomonas aeruginosa (HCC)   Paroxysmal atrial fibrillation (HCC)   Delirium, acute   1. Acute on chronic respiratory failure with hypoxia continue capping trials as tolerated.  Continue secretion management pulmonary toilet. 2. Severe COPD continue present therapy 3. Healthcare associated pneumonia with Pseudomonas continue current management 4. Atrial fibrillation rate controlled 5. Delirium continue present therapy   I have personally seen and evaluated the patient, evaluated laboratory and imaging results, formulated the assessment and plan and placed orders. The Patient requires high complexity decision making for assessment and support.  Case was discussed on Rounds with the Respiratory Therapy Staff  Allyne Gee, MD [facility] Pulmonary Critical Care Medicine Sleep Medicine

## 2018-05-30 ENCOUNTER — Other Ambulatory Visit: Payer: Self-pay

## 2018-05-30 ENCOUNTER — Encounter (HOSPITAL_COMMUNITY): Payer: Self-pay

## 2018-05-30 ENCOUNTER — Inpatient Hospital Stay (HOSPITAL_COMMUNITY)
Admission: RE | Admit: 2018-05-30 | Discharge: 2018-06-11 | DRG: 946 | Disposition: A | Discharge status: Home Health | Payer: BLUE CROSS/BLUE SHIELD | Source: Other Acute Inpatient Hospital | Attending: Physical Medicine & Rehabilitation | Admitting: Physical Medicine & Rehabilitation

## 2018-05-30 DIAGNOSIS — J449 Chronic obstructive pulmonary disease, unspecified: Secondary | ICD-10-CM | POA: Diagnosis present

## 2018-05-30 DIAGNOSIS — I10 Essential (primary) hypertension: Secondary | ICD-10-CM | POA: Diagnosis present

## 2018-05-30 DIAGNOSIS — K219 Gastro-esophageal reflux disease without esophagitis: Secondary | ICD-10-CM | POA: Diagnosis present

## 2018-05-30 DIAGNOSIS — R451 Restlessness and agitation: Secondary | ICD-10-CM | POA: Diagnosis not present

## 2018-05-30 DIAGNOSIS — D62 Acute posthemorrhagic anemia: Secondary | ICD-10-CM | POA: Diagnosis not present

## 2018-05-30 DIAGNOSIS — E876 Hypokalemia: Secondary | ICD-10-CM | POA: Diagnosis present

## 2018-05-30 DIAGNOSIS — F17210 Nicotine dependence, cigarettes, uncomplicated: Secondary | ICD-10-CM | POA: Diagnosis present

## 2018-05-30 DIAGNOSIS — Z930 Tracheostomy status: Secondary | ICD-10-CM

## 2018-05-30 DIAGNOSIS — I2510 Atherosclerotic heart disease of native coronary artery without angina pectoris: Secondary | ICD-10-CM | POA: Diagnosis present

## 2018-05-30 DIAGNOSIS — D509 Iron deficiency anemia, unspecified: Secondary | ICD-10-CM | POA: Diagnosis present

## 2018-05-30 DIAGNOSIS — Z9079 Acquired absence of other genital organ(s): Secondary | ICD-10-CM

## 2018-05-30 DIAGNOSIS — F329 Major depressive disorder, single episode, unspecified: Secondary | ICD-10-CM | POA: Diagnosis present

## 2018-05-30 DIAGNOSIS — R339 Retention of urine, unspecified: Secondary | ICD-10-CM | POA: Diagnosis not present

## 2018-05-30 DIAGNOSIS — Z79899 Other long term (current) drug therapy: Secondary | ICD-10-CM

## 2018-05-30 DIAGNOSIS — Z7901 Long term (current) use of anticoagulants: Secondary | ICD-10-CM | POA: Diagnosis not present

## 2018-05-30 DIAGNOSIS — I480 Paroxysmal atrial fibrillation: Secondary | ICD-10-CM | POA: Diagnosis present

## 2018-05-30 DIAGNOSIS — Z7951 Long term (current) use of inhaled steroids: Secondary | ICD-10-CM

## 2018-05-30 DIAGNOSIS — R1310 Dysphagia, unspecified: Secondary | ICD-10-CM | POA: Diagnosis present

## 2018-05-30 DIAGNOSIS — G9341 Metabolic encephalopathy: Secondary | ICD-10-CM | POA: Diagnosis not present

## 2018-05-30 DIAGNOSIS — Z915 Personal history of self-harm: Secondary | ICD-10-CM | POA: Diagnosis not present

## 2018-05-30 DIAGNOSIS — R918 Other nonspecific abnormal finding of lung field: Secondary | ICD-10-CM | POA: Diagnosis present

## 2018-05-30 DIAGNOSIS — D649 Anemia, unspecified: Secondary | ICD-10-CM

## 2018-05-30 DIAGNOSIS — E782 Mixed hyperlipidemia: Secondary | ICD-10-CM | POA: Diagnosis present

## 2018-05-30 DIAGNOSIS — R5381 Other malaise: Secondary | ICD-10-CM | POA: Diagnosis present

## 2018-05-30 DIAGNOSIS — R0989 Other specified symptoms and signs involving the circulatory and respiratory systems: Secondary | ICD-10-CM

## 2018-05-30 DIAGNOSIS — Z818 Family history of other mental and behavioral disorders: Secondary | ICD-10-CM

## 2018-05-30 DIAGNOSIS — Z8249 Family history of ischemic heart disease and other diseases of the circulatory system: Secondary | ICD-10-CM

## 2018-05-30 DIAGNOSIS — Z8349 Family history of other endocrine, nutritional and metabolic diseases: Secondary | ICD-10-CM

## 2018-05-30 DIAGNOSIS — R1312 Dysphagia, oropharyngeal phase: Secondary | ICD-10-CM | POA: Diagnosis not present

## 2018-05-30 LAB — CULTURE, RESPIRATORY W GRAM STAIN

## 2018-05-30 MED ORDER — SELENIUM SULFIDE 1 % EX LOTN
TOPICAL_LOTION | Freq: Every day | CUTANEOUS | Status: DC
  Administered 2018-06-04 – 2018-06-07 (×4): via TOPICAL
  Filled 2018-05-30: qty 207

## 2018-05-30 MED ORDER — METOPROLOL TARTRATE 50 MG PO TABS
50.0000 mg | ORAL_TABLET | Freq: Three times a day (TID) | ORAL | Status: DC
  Administered 2018-05-30 – 2018-06-11 (×35): 50 mg via ORAL
  Filled 2018-05-30 (×37): qty 1

## 2018-05-30 MED ORDER — IPRATROPIUM-ALBUTEROL 0.5-2.5 (3) MG/3ML IN SOLN: 3.0000 mL | RESPIRATORY_TRACT | Status: DC | PRN

## 2018-05-30 MED ORDER — APIXABAN 5 MG PO TABS
5.0000 mg | ORAL_TABLET | Freq: Two times a day (BID) | ORAL | Status: DC
  Administered 2018-05-30 – 2018-06-11 (×24): 5 mg via ORAL
  Filled 2018-05-30 (×24): qty 1

## 2018-05-30 MED ORDER — OXYCODONE HCL 5 MG PO TABS: 5.0000 mg | ORAL_TABLET | Freq: Four times a day (QID) | ORAL | Status: DC | PRN

## 2018-05-30 MED ORDER — QUETIAPINE FUMARATE 50 MG PO TABS
50.0000 mg | ORAL_TABLET | Freq: Two times a day (BID) | ORAL | Status: DC
  Administered 2018-05-30 – 2018-06-09 (×20): 50 mg via ORAL
  Filled 2018-05-30 (×20): qty 1

## 2018-05-30 MED ORDER — DIPHENHYDRAMINE HCL 12.5 MG/5ML PO ELIX: 12.5000 mg | ORAL_SOLUTION | Freq: Four times a day (QID) | ORAL | Status: DC | PRN

## 2018-05-30 MED ORDER — BISACODYL 10 MG RE SUPP
10.0000 mg | Freq: Every day | RECTAL | Status: DC | PRN
  Administered 2018-06-02 – 2018-06-09 (×3): 10 mg via RECTAL
  Filled 2018-05-30 (×3): qty 1

## 2018-05-30 MED ORDER — GUAIFENESIN-DM 100-10 MG/5ML PO SYRP: 5.0000 mL | ORAL_SOLUTION | Freq: Four times a day (QID) | ORAL | Status: DC | PRN

## 2018-05-30 MED ORDER — DILTIAZEM HCL 60 MG PO TABS
90.0000 mg | ORAL_TABLET | Freq: Three times a day (TID) | ORAL | Status: DC
  Administered 2018-05-30 – 2018-06-11 (×37): 90 mg via ORAL
  Filled 2018-05-30 (×38): qty 1

## 2018-05-30 MED ORDER — PROCHLORPERAZINE MALEATE 5 MG PO TABS: 5.0000 mg | ORAL_TABLET | Freq: Four times a day (QID) | ORAL | Status: DC | PRN

## 2018-05-30 MED ORDER — MIRTAZAPINE 15 MG PO TABS
15.0000 mg | ORAL_TABLET | Freq: Every day | ORAL | Status: DC
  Administered 2018-05-30 – 2018-06-10 (×12): 15 mg via ORAL
  Filled 2018-05-30 (×13): qty 1

## 2018-05-30 MED ORDER — VITAMIN B-1 100 MG PO TABS
100.0000 mg | ORAL_TABLET | Freq: Every day | ORAL | Status: DC
  Administered 2018-06-01 – 2018-06-11 (×11): 100 mg via ORAL
  Filled 2018-05-30 (×12): qty 1

## 2018-05-30 MED ORDER — MUSCLE RUB 10-15 % EX CREA
TOPICAL_CREAM | Freq: Two times a day (BID) | CUTANEOUS | Status: DC
  Administered 2018-06-01 – 2018-06-07 (×9): via TOPICAL
  Filled 2018-05-30: qty 85

## 2018-05-30 MED ORDER — FAMOTIDINE 20 MG PO TABS
20.0000 mg | ORAL_TABLET | Freq: Every day | ORAL | Status: DC
  Administered 2018-06-01 – 2018-06-11 (×11): 20 mg via ORAL
  Filled 2018-05-30 (×12): qty 1

## 2018-05-30 MED ORDER — TRAZODONE HCL 50 MG PO TABS
25.0000 mg | ORAL_TABLET | Freq: Every evening | ORAL | Status: DC | PRN
  Administered 2018-06-08: 50 mg via ORAL
  Filled 2018-05-30: qty 1

## 2018-05-30 MED ORDER — TRAMADOL HCL 50 MG PO TABS
50.0000 mg | ORAL_TABLET | Freq: Four times a day (QID) | ORAL | Status: DC | PRN
  Administered 2018-05-31 – 2018-06-10 (×16): 50 mg via ORAL
  Filled 2018-05-30 (×17): qty 1

## 2018-05-30 MED ORDER — ALUM & MAG HYDROXIDE-SIMETH 200-200-20 MG/5ML PO SUSP
30.0000 mL | ORAL | Status: DC | PRN
  Filled 2018-05-30: qty 30

## 2018-05-30 MED ORDER — POLYETHYLENE GLYCOL 3350 17 G PO PACK: 17.0000 g | PACK | Freq: Every day | ORAL | Status: DC | PRN

## 2018-05-30 MED ORDER — PAROXETINE HCL 20 MG PO TABS
20.0000 mg | ORAL_TABLET | Freq: Every day | ORAL | Status: DC
  Administered 2018-06-01 – 2018-06-11 (×11): 20 mg via ORAL
  Filled 2018-05-30 (×12): qty 1

## 2018-05-30 MED ORDER — TROLAMINE SALICYLATE 10 % EX CREA: TOPICAL_CREAM | Freq: Two times a day (BID) | CUTANEOUS | Status: DC | PRN

## 2018-05-30 MED ORDER — GUAIFENESIN 200 MG PO TABS
200.0000 mg | ORAL_TABLET | Freq: Four times a day (QID) | ORAL | Status: DC
  Administered 2018-05-30 – 2018-06-11 (×43): 200 mg via ORAL
  Filled 2018-05-30 (×51): qty 1

## 2018-05-30 MED ORDER — BUDESONIDE 0.25 MG/2ML IN SUSP
0.2500 mg | Freq: Two times a day (BID) | RESPIRATORY_TRACT | Status: DC
  Administered 2018-05-30 – 2018-06-11 (×23): 0.25 mg via RESPIRATORY_TRACT
  Filled 2018-05-30 (×25): qty 2

## 2018-05-30 MED ORDER — VITAMIN C 500 MG PO TABS
500.0000 mg | ORAL_TABLET | Freq: Every day | ORAL | Status: DC
  Administered 2018-06-01: 500 mg via ORAL
  Filled 2018-05-30 (×2): qty 1

## 2018-05-30 MED ORDER — FE FUMARATE-B12-VIT C-FA-IFC PO CAPS
1.0000 | ORAL_CAPSULE | Freq: Two times a day (BID) | ORAL | Status: DC
  Administered 2018-05-30 – 2018-06-11 (×24): 1 via ORAL
  Filled 2018-05-30 (×24): qty 1

## 2018-05-30 MED ORDER — CLONAZEPAM 0.5 MG PO TABS
0.5000 mg | ORAL_TABLET | Freq: Three times a day (TID) | ORAL | Status: DC
  Administered 2018-05-30 – 2018-06-11 (×36): 0.5 mg via ORAL
  Filled 2018-05-30 (×37): qty 1

## 2018-05-30 MED ORDER — PRO-STAT SUGAR FREE PO LIQD
30.0000 mL | Freq: Two times a day (BID) | ORAL | Status: DC
  Administered 2018-05-30 – 2018-06-11 (×22): 30 mL via ORAL
  Filled 2018-05-30 (×24): qty 30

## 2018-05-30 MED ORDER — CALCIUM POLYCARBOPHIL 625 MG PO TABS
625.0000 mg | ORAL_TABLET | Freq: Two times a day (BID) | ORAL | Status: DC
  Administered 2018-05-30: 625 mg via ORAL
  Filled 2018-05-30 (×3): qty 1

## 2018-05-30 MED ORDER — PROCHLORPERAZINE 25 MG RE SUPP: 12.5000 mg | Freq: Four times a day (QID) | RECTAL | Status: DC | PRN

## 2018-05-30 MED ORDER — TRAMADOL HCL 50 MG PO TABS: 50.0000 mg | ORAL_TABLET | Freq: Four times a day (QID) | ORAL | Status: DC | PRN

## 2018-05-30 MED ORDER — ACETAMINOPHEN 325 MG PO TABS
325.0000 mg | ORAL_TABLET | ORAL | Status: DC | PRN
  Administered 2018-06-02 – 2018-06-10 (×6): 650 mg via ORAL
  Filled 2018-05-30 (×6): qty 2

## 2018-05-30 MED ORDER — PROCHLORPERAZINE EDISYLATE 10 MG/2ML IJ SOLN: 5.0000 mg | Freq: Four times a day (QID) | INTRAMUSCULAR | Status: DC | PRN

## 2018-05-30 MED ORDER — FOLIC ACID 1 MG PO TABS
1.0000 mg | ORAL_TABLET | Freq: Every day | ORAL | Status: DC
  Administered 2018-06-01 – 2018-06-11 (×11): 1 mg via ORAL
  Filled 2018-05-30 (×12): qty 1

## 2018-05-30 MED ORDER — FLEET ENEMA 7-19 GM/118ML RE ENEM: 1.0000 | ENEMA | Freq: Once | RECTAL | Status: DC | PRN

## 2018-05-30 NOTE — Progress Notes (Signed)
Pt arrived to unit at 1514 via WC. Transferred to bed x 2 assist. Oriented to room and safety plan/precautions. Call bell placed in reach. Bed in low position. Side rails x 4 d/t high fall risk and s/p fall x 2 from other unit. Will cont to monitor.   Erie Noe, LPN

## 2018-05-30 NOTE — H&P (Signed)
Physical Medicine and Rehabilitation Admission H&P    CC: Debility   HPI: Kyle Lowery is a 68 year old male with history of CAD, severe COPD with ongoing tobacco use, depression who was admitted to [facility] on 04/22/2018 due to influenza A and H. influenzae with pulmonary filtrates, hypoxia, complicated by A. fib with RVR.  He was intubated and started on broad-spectrum antibiotics.  Hospital course significant for issues due to delirium with agitation, recurrent episodes of A flutter, ABLA, Pseudomonas/Viridans Strep HCAP and inability to tolerate extubation.  Tracheostomy was placed on 05/01/2018.  He was transferred to [facility] on 05/09/2018 for vent wean and started on ATC trials. He was started on capping trials.  He was treated with short course of cipro for Pseudomonas tracheobronchitis. He was downsized to #4 CFS on 4/20 and has increse in secretions which were malodorous sputum with repeat cultures positive for Pseudomonas, which is being monitored off antibiotics due to patient being afebrile and stable white count. Delirium being managed with use of Klonopin, Paxil, Seroquel and Haldol. He is tolerating PMSV and started on dysphagia 3 honey liquids on 4/17--> advanced to nectars by 4/21 but refusing liquids. Respiratory status stable and activity tolerance is improving. Therapy ongoing and CIR recommended due to functional deficits.  Please see preadmission assessment from earlier today as well.  Review of Systems  HENT: Negative for hearing loss and tinnitus.   Eyes: Negative for blurred vision and double vision.  Respiratory: Positive for shortness of breath. Negative for cough.   Cardiovascular: Negative for chest pain and palpitations.  Gastrointestinal: Positive for diarrhea. Negative for heartburn and nausea.  Genitourinary: Negative for dysuria and urgency.  Musculoskeletal: Positive for back pain (chronic).  Skin: Negative for itching.  Neurological: Positive for  weakness and headaches (chronic).  Psychiatric/Behavioral: Negative for hallucinations. The patient is not nervous/anxious.   All other systems reviewed and are negative.   Past Medical History:  Diagnosis Date  . Acute on chronic respiratory failure with hypoxia ([place])   . Carotid artery stenosis   . COPD (chronic obstructive pulmonary disease) ([place])   . COPD, severe ([place])   . Coronary atherosclerosis of native coronary artery    Mild to moderate NOCAD with 80% ostial diagonal 1/12  . Delirium, acute   . Depression    Prior suicide attempt  . Essential hypertension, benign   . GERD (gastroesophageal reflux disease)    Esophageal dilatation  . Mixed hyperlipidemia   . Paroxysmal atrial fibrillation (HCC)   . Pneumonia due to Pseudomonas aeruginosa ([place])   . Syncope    Neurocardiogenic    Past Surgical History:  Procedure Laterality Date  . ESOPHAGOGASTRODUODENOSCOPY (EGD) WITH ESOPHAGEAL DILATION    . INGUINAL HERNIA REPAIR    . LEFT HEART CATH     Unable to Stent Blockage  . Left orchiectomy    . VASECTOMY      Family History  Problem Relation Age of Onset  . Depression Father        Suicide age 1  . Hypercholesterolemia Father   . Hypertension Father   . Hypertension Mother   . Melanoma Mother   . Hypertension Brother   . Lung disease Neg Hx     Social History:  Married. Independent PTA. Used to work in Architect and other odd jobs. He reports that he has been smoking cigarettes--1PPD He started smoking about 54 years ago. He has a 53.00 pack-year smoking history. He has never  used smokeless tobacco. He reports that he does not drink alcohol or use drugs.    Allergies: No Known Allergies    Medications Prior to Admission  Medication Sig Dispense Refill  . albuterol (PROVENTIL) (2.5 MG/3ML) 0.083% nebulizer solution Take 3 mLs (2.5 mg total) by nebulization every 4 (four) hours as needed for wheezing or shortness of breath. 75 mL 12  . Amino Acids-Protein  Hydrolys (FEEDING SUPPLEMENT, PRO-STAT SUGAR FREE 64,) LIQD Place 30 mLs into feeding tube daily. 887 mL 0  . apixaban (ELIQUIS) 5 MG TABS tablet Place 1 tablet (5 mg total) into feeding tube 2 (two) times daily. 60 tablet   . arformoterol (BROVANA) 15 MCG/2ML NEBU Take 2 mLs (15 mcg total) by nebulization 2 (two) times daily. 120 mL   . bisacodyl (DULCOLAX) 10 MG suppository Place 1 suppository (10 mg total) rectally daily as needed for moderate constipation. 12 suppository 0  . budesonide (PULMICORT) 0.5 MG/2ML nebulizer solution Take 2 mLs (0.5 mg total) by nebulization 2 (two) times daily.  12  . ceFEPIme 2 g in sodium chloride 0.9 % 100 mL Inject 2 g into the vein every 8 (eight) hours.    . clonazePAM (KLONOPIN) 1 MG tablet Place 1 tablet (1 mg total) into feeding tube every 8 (eight) hours. 30 tablet 0  . diltiazem (CARDIZEM) 10 mg/ml oral suspension Place 9 mLs (90 mg total) into feeding tube every 8 (eight) hours.    . docusate (COLACE) 50 MG/5ML liquid Place 10 mLs (100 mg total) into feeding tube 2 (two) times daily as needed for mild constipation. 100 mL 0  . ferrous sulfate 220 (44 Fe) MG/5ML solution Place 5 mLs (220 mg total) into feeding tube 2 (two) times daily with a meal. 263 mL 3  . folic acid (FOLVITE) 1 MG tablet Place 1 tablet (1 mg total) into feeding tube daily.    Marland Kitchen ipratropium-albuterol (DUONEB) 0.5-2.5 (3) MG/3ML SOLN Take 3 mLs by nebulization every 6 (six) hours. 360 mL   . metoprolol tartrate (LOPRESSOR) 25 mg/10 mL SUSP Place 20 mLs (50 mg total) into feeding tube every 8 (eight) hours for 5 days. 300 mL 0  . Nutritional Supplements (FEEDING SUPPLEMENT, JEVITY 1.2 CAL,) LIQD Place 1,000 mLs into feeding tube continuous.  0  . pantoprazole sodium (PROTONIX) 40 mg/20 mL PACK Place 20 mLs (40 mg total) into feeding tube daily at 12 noon. 30 each   . PARoxetine (PAXIL) 20 MG tablet Place 1 tablet (20 mg total) into feeding tube daily.    . pravastatin (PRAVACHOL) 20 MG  tablet Take 20 mg by mouth daily.    . QUEtiapine (SEROQUEL) 50 MG tablet Place 1 tablet (50 mg total) into feeding tube at bedtime.    . thiamine 100 MG tablet Place 1 tablet (100 mg total) into feeding tube daily.    . vitamin C (VITAMIN C) 250 MG tablet Place 1 tablet (250 mg total) into feeding tube 2 (two) times daily.      Drug Regimen Review  Drug regimen was reviewed and remains appropriate with no significant issues identified  Home: Home Living Living Arrangements: Spouse/significant other Available Help at Discharge: Family, Available 24 hours/day, Friend(s) Type of Home: House Home Access: Stairs to enter [employer] of Steps: 4 Entrance Stairs-Rails: Can reach both Home Layout: One level Bathroom Shower/Tub: Chiropodist: Standard Bathroom Accessibility: Yes  Lives With: Spouse   Functional History:    Functional Status:  Mobility:  Ambulation/Gait Gait Distance (Feet): 40 Feet General Gait Details: min assist with RW    ADL:    Cognition: Cognition Overall Cognitive Status: Impaired/Different from baseline Orientation Level: Other (comment) Cognition Overall Cognitive Status: Impaired/Different from baseline  There were no vitals taken for this visit. Physical Exam  Nursing note and vitals reviewed. Constitutional: He appears well-developed. He appears cachectic. No distress.  HENT:  Head: Normocephalic and atraumatic.  + Trach plugged  Eyes: EOM are normal. Right eye exhibits no discharge. Left eye exhibits no discharge.  Neck:  CFS # 4 in place with button plug.   Respiratory: Effort normal. No respiratory distress.  GI: He exhibits no distension.  Musculoskeletal:     Comments: No edema or tenderness in extremities  Neurological: He is alert.  Oriented x2 Motor: Grossly 4+/5 throughout Dysphonia  Skin: Skin is warm and dry. He is not diaphoretic.  Seborrhea noted on nasal folds and in beard.    Psychiatric: His affect is blunt. His speech is delayed. He is slowed.    Results for orders placed or performed during the hospital encounter of 05/09/18 (from the past 48 hour(s))  CBC     Status: Abnormal   Collection Time: 05/29/18  5:58 AM  Result Value Ref Range   WBC 10.3 4.0 - 10.5 K/uL   RBC 2.90 (L) 4.22 - 5.81 MIL/uL   Hemoglobin 9.1 (L) 13.0 - 17.0 g/dL   HCT 29.4 (L) 39.0 - 52.0 %   MCV 101.4 (H) 80.0 - 100.0 fL   MCH 31.4 26.0 - 34.0 pg   MCHC 31.0 30.0 - 36.0 g/dL   RDW 16.2 (H) 11.5 - 15.5 %   Platelets 317 150 - 400 K/uL   nRBC 0.0 0.0 - 0.2 %    Comment: Performed at [facility], [address] [place], [place] [address]  Basic metabolic panel     Status: Abnormal   Collection Time: 05/29/18  5:58 AM  Result Value Ref Range   Sodium 141 135 - 145 mmol/L   Potassium 3.5 3.5 - 5.1 mmol/L   Chloride 104 98 - 111 mmol/L   CO2 25 22 - 32 mmol/L   Glucose, Bld 95 70 - 99 mg/dL   BUN 22 8 - 23 mg/dL   Creatinine, Ser 1.09 0.61 - 1.24 mg/dL   Calcium 8.7 (L) 8.9 - 10.3 mg/dL   GFR calc non Af Amer >60 >60 mL/min   GFR calc Af Amer >60 >60 mL/min   Anion gap 12 5 - 15    Comment: Performed at [facility] [address] [place], [place] [address]  Magnesium     Status: None   Collection Time: 05/29/18  5:58 AM  Result Value Ref Range   Magnesium 2.0 1.7 - 2.4 mg/dL    Comment: Performed at [facility] [address] [place], [place] [address]  Vancomycin, trough     Status: Abnormal   Collection Time: 05/29/18 10:22 AM  Result Value Ref Range   Vancomycin Tr <4 (L) 15 - 20 ug/mL    Comment: Performed at [facility], [place] [address] [place], [place] [address]   No results found.     Medical Problem List and Plan: 1.  Mobility deficits, limitations in endurance, limitation self-care, dysphagia secondary to debility  Admit to CIR 2.  Antithrombotics: -DVT/anticoagulation:  Pharmaceutical: Other (comment)--Eliquis   -antiplatelet therapy: N/A 3. Pain Management: Oxycodone prn 4. Mood: LCSW  to follow for evaluation and support.   -antipsychotic agents: Klonopin, and Seroquel.  Plan to wean medications as tolerated. 5. Neuropsych: This patient is not fully capable of making decisions on his own behalf. 6. Skin/Wound Care: Routine pressure relief measures. Continue prostat.  7. Fluids/Electrolytes/Nutrition: Monitor I/Os.  BMP ordered for tomorrow a.m. 8. COPD/hx of lung nodules: Continue duonebs prn. Was on symbicort in the past (2 years ago?).  Monitor respiratory rate with increased exertion. 9. HTN: Monitor BP bid- off medications at this time.  Monitor with increased mobility. 10. Delirium: See #4. 11.  Anemia : Continue iron supplement. Monitor for any signs of bleeding.  CBC ordered for tomorrow morning. 12. Dysphagia: On dysphagia 3, nectars. Maintaining adequate hydration. Will recheck BMET in am.  Advance diet as tolerated.  Bary Leriche, PA-C 05/30/2018

## 2018-05-30 NOTE — Progress Notes (Signed)
Jamse Arn, MD  Physician  Physical Medicine and Rehabilitation  PMR Pre-admission  Signed  Date of Service:  05/28/2018 2:16 PM       Related encounter: Admission (Current) from 05/09/2018 in [place] all _0 Manual_1 Template_2 Copied  Added by: _3 Jamse Arn, MD_4 Michel Santee, PT  _5 Hover for details PMR Admission Coordinator Pre-Admission Assessment  Patient: Kyle Lowery is an 68 y.o., male MRN: [id number] DOB: 1950/04/07 Height:  _6  Weight:  63.4kg  Insurance Information HMO:     PPO: yes     PCP:      IPA:      80/20:      OTHER:  PRIMARY: [employer] of [place]      Policy#: [id number]      Subscriber: patient's wife CM Name: Ailene Ravel      Phone#: [phone]     Fax#:  Pre-Cert#: [id number] updates due on 5/6 to Discharge Services via fax [phone]     Employer:  Benefits:  Phone #: [phone]     Name:  Eff. Date: 04/08/2018     Deduct: $1400 (met $1400)      Out of Pocket Max: [phone] (met [phone])      Life Max: n/a CIR: 70%      SNF: 70% Outpatient: 70%     Co-Pay: 30% (or $35-$75 dependent upon services) Home Health: 70%      Co-Pay: 30% DME: 70%     Co-Pay: 30% Providers: in network SECONDARY: Medicare Part A and B      Policy#: [id number]     Subscriber: patient CM Name:       Phone#:      Fax#:  Pre-Cert#: verified via Industrial/product designer,      Employer:  Benefits:  Phone #:      Name:  Eff. Date:  part a eligible on 06/08/2003, Part B eligible on 09/07/2004      Deduct: $1408       Out of Pocket Max:       Life Max:  CIR:       SNF:  Outpatient:      Co-Pay:  Home Health:       Co-Pay:  DME:      Co-Pay:   Medicaid Application Date:       Case Manager:  Disability Application Date:       Case Worker:   The Data Collection Information Summary for patients in Inpatient Rehabilitation Facilities with attached Privacy Act [place] Records was provided and verbally  reviewed with: N/A  Emergency Contact Information         Contact Information    Name Relation Home Work Mobile   [place] Spouse [place]     [name] Daughter   [phone]      Current Medical History  Patient Admitting Diagnosis: debility 2/2 acute on chronic respiratory failure with hypoxia  History of Present Illness: pt is a 68 y/o male with PMH significant for CAD, COPD, and depression, who presented to ED in [place] with c/o flulike symptoms 2 weeks in duration.  Pt had seen PCP who prescribed a Z-Pak, which the patient took without resolution of symptoms.  He was prescribed Tamiflu in the hospital in [place] and discharged after 2 days.  Pt did have contact with 2 grandchildren who tested positive for influenza.  Pt presented to  [facility] ED on 3/15 with persistent flu like symptoms and A-fib with RVR, hypokalemia, hypoxia, and dyspnea.  CXR showing bilateral infiltrates and pt requiring intubation so was transferred to [facility].  Pt with tracheostomy on 05/01/2018.  Transitioned to [facility] on 05/09/2018.  Pt has been downsized to size 6 cuffless trach, and began successful capping trials on 05/26/2018.  Pt remains capped, safety mitts on to prevent pt from pulling lines 2/2 confusion.  Recommendations were made for inpatient rehab to maximize independence prior to returning home.    Patient's medical record from [facility] has been reviewed by the rehabilitation admission coordinator and physician.  Past Medical History      Past Medical History:  Diagnosis Date   Acute on chronic respiratory failure with hypoxia (HCC)    Carotid artery stenosis    COPD (chronic obstructive pulmonary disease) (HCC)    COPD, severe (HCC)    Coronary atherosclerosis of native coronary artery    Mild to moderate NOCAD with 80% ostial diagonal 1/12   Delirium, acute    Depression    Prior  suicide attempt   Essential hypertension, benign    GERD (gastroesophageal reflux disease)    Esophageal dilatation   Mixed hyperlipidemia    Paroxysmal atrial fibrillation (HCC)    Pneumonia due to Pseudomonas aeruginosa ([place])    Syncope    Neurocardiogenic    Family History   family history includes Depression in his father; Hypercholesterolemia in his father; Hypertension in his brother, father, and mother; Melanoma in his mother.  Prior Rehab/Hospitalizations Has the patient had prior rehab or hospitalizations prior to admission? No  Has the patient had major surgery during 100 days prior to admission? Yes, tracheostomy and PEG tube placement         Current Medications No current facility-administered medications for this encounter.   Patients Current Diet:     Diet Order                  DIET DYS 3 Room service appropriate? Yes with Assist; Fluid consistency: Nectar Thick  Diet effective now               Precautions / Restrictions Precautions Precaution Comments: 39m cuffless trach, capped since 4/18 Restrictions Weight Bearing Restrictions: No   Has the patient had 2 or more falls or a fall with injury in the past year? No  Prior Activity Level Limited Community (1-2x/wk): limited community ambulator, does not drive  Prior Functional Level Self Care: Did the patient need help bathing, dressing, using the toilet or eating? Independent  Indoor Mobility: Did the patient need assistance with walking from room to room (with or without device)? Independent  Stairs: Did the patient need assistance with internal or external stairs (with or without device)? Independent  Functional Cognition: Did the patient need help planning regular tasks such as shopping or remembering to take medications? Independent  Home Assistive Devices / Equipment  Prior Device Use: Indicate devices/aids used by the patient prior to current illness,  exacerbation or injury? None of the above  Current Functional Level Cognition  Overall Cognitive Status: Impaired/Different from baseline Orientation Level: Other (comment)    Extremity Assessment (includes Sensation/Coordination)   generalized weakness in BUEs and BLEs       ADLs    CGA/min assist for bathing/dressing   Mobility    min assist overall, 40' with RW   Transfers    min assist   Ambulation /  Gait / Stairs / Water engineer (Feet): 40 Feet General Gait Details: min assist with RW    Posture / Balance      Special needs/care consideration BiPAP/CPAP no CPM no Continuous Drip IV no Dialysis no        Days n/a Life Vest no Oxygen no Special Bed no Trach Size 79m, cuffless, capped on 05/26/2018 Wound Vac (area) no      Location n/a Skin MASD on groin/penis                           Bowel mgmt: incontinent, last BM 05/28/2018 Bladder mgmt: incontinent Diabetic mgmt: no Behavioral consideration no Chemo/radiation no   Previous Home Environment (from acute therapy documentation) Living Arrangements: Spouse/significant other  Lives With: Spouse Available Help at Discharge: Family, Available 24 hours/day, Friend(s) Type of Home: House Home Layout: One level Home Access: Stairs to enter Entrance Stairs-Rails: Can reach both Entrance Stairs-Number of Steps: 4 Bathroom Shower/Tub: TChiropodist Standard Bathroom Accessibility: Yes How Accessible: Accessible via walker Home Care Services: No  Discharge Living Setting Plans for Discharge Living Setting: Patient's home Type of Home at Discharge: House Discharge Home Layout: One level Discharge Home Access: Stairs to enter Entrance Stairs-Rails: Can reach both Entrance Stairs-Number of Steps: 4 Discharge Bathroom Shower/Tub: Tub/shower unit Discharge Bathroom Toilet: Standard Discharge Bathroom Accessibility: Yes How Accessible:  Accessible via walker  Social/Family/Support Systems Patient Roles: Spouse Anticipated Caregiver: wife, CBaldo AshAnticipated CAmbulance personInformation: [phone]/Limitations of Caregiver: works nights, but has arranged for friends to stay with pt while she's at work Caregiver Availability: 24/7 Discharge Plan Discussed with Primary Caregiver: Yes Is Caregiver In Agreement with Plan?: Yes  Goals/Additional Needs Patient/Family Goal for Rehab: PT/OT/SLP supervision Expected length of stay: 10-14 days Dietary Needs: dys 3 heart healthy, thin Equipment Needs: tbd Pt/Family Agrees to Admission and willing to participate: Yes Program Orientation Provided & Reviewed with Pt/Caregiver Including Roles  & Responsibilities: Yes   Possible need for SNF placement upon discharge: not anticipated  Patient Condition: [facility] spoken [facility] CM, and patient and spouse. I met with patient at the bedside and discussed with wife via phone for inpatient rehabilitation assessment.  Patient will benefit from ongoing PT, OT and SLP, can actively participate in 3 hours of therapy a day 5 days of the week, and can make measurable gains during the admission.  Patient will also benefit from the coordinated team approach during an Inpatient Acute Rehabilitation admission.  The patient will receive intensive therapy as well as Rehabilitation physician, nursing, social worker, and care management interventions.  Due to bladder management, bowel management, safety, skin/wound care, disease management, medication administration, pain management and patient education the patient requires 24 hour a day rehabilitation nursing.  The patient is currently min with mobility and basic ADLs.  Discharge setting and therapy post discharge at home with home health is anticipated.  Patient has agreed to participate in the Acute Inpatient Rehabilitation Program and  will admit today.  Preadmission Screen Completed By:  CMichel Santee 05/30/2018 11:03 AM ______________________________________________________________________   Discussed status with Dr. PPosey Prontoon 05/30/18  at 11:03 AM  and received approval for admission today.  Admission Coordinator:  CMichel Santee PT, time 11:03 AM /Sudie Grumbling04/22/20    Assessment/Plan: Diagnosis: debility   1. Does the need for close, 24 hr/day Medical supervision in concert with  the patient's rehab needs make it unreasonable for this patient to be served in a less intensive setting? Potentially  2. Co-Morbidities requiring supervision/potential complications:  CAD, COPD, and depression 3. Due to safety, disease management and patient education, does the patient require 24 hr/day rehab nursing? Potentially 4. Does the patient require coordinated care of a physician, rehab nurse, PT (1-2 hrs/day, 5 days/week) and OT (1-2 hrs/day, 5 days/week) to address physical and functional deficits in the context of the above medical diagnosis(es)? Yes Addressing deficits in the following areas: balance, endurance, locomotion, strength, transferring, bathing, dressing, toileting and psychosocial support 5. Can the patient actively participate in an intensive therapy program of at least 3 hrs of therapy 5 days a week? Yes 6. The potential for patient to make measurable gains while on inpatient rehab is excellent 7. Anticipated functional outcomes upon discharge from inpatients are: supervision PT, supervision OT, n/a SLP 8. Estimated rehab length of stay to reach the above functional goals is: 8-11 days. 9. Anticipated D/C setting: Home 10. Anticipated post D/C treatments: HH therapy and Home excercise program 11. Overall Rehab/Functional Prognosis: excellent  MD Signature: Delice Lesch, MD, ABPMR        Revision History

## 2018-05-30 NOTE — Progress Notes (Addendum)
Pulmonary Critical Care Medicine [place]   PULMONARY CRITICAL CARE SERVICE  PROGRESS NOTE  Date of Service: 05/30/2018  Kyle Lowery  JHE:[id number]  DOB: 1950/08/12   DOA: 05/09/2018  Referring Physician: Merton Border, MD  HPI: Kyle Lowery is a 68 y.o. male seen for follow up of Acute on Chronic Respiratory Failure.  Patient has now been capped on room air for 4 days.  Patient will be transferred to inpatient rehab today.  Currently doing well with no distress.  Medications: Reviewed on Rounds  Physical Exam:  Vitals: Pulse 63 respirations 18 BP 155/82 O2 sat 97% temp 96.4  Ventilator Settings not currently on ventilator  . General: Comfortable at this time . Eyes: Grossly normal lids, irises & conjunctiva . ENT: grossly tongue is normal . Neck: no obvious mass . Cardiovascular: S1 S2 normal no gallop . Respiratory: No rales or rhonchi noted . Abdomen: soft . Skin: no rash seen on limited exam . Musculoskeletal: not rigid . Psychiatric:unable to assess . Neurologic: no seizure no involuntary movements         Lab Data:   Basic Metabolic Panel: Recent Labs  Lab 05/24/18 0542 05/29/18 0558  NA 142 141  K 3.6 3.5  CL 102 104  CO2 29 25  GLUCOSE 102* 95  BUN 26* 22  CREATININE 1.09 1.09  CALCIUM 8.6* 8.7*  MG 2.0 2.0  PHOS 4.3  --     ABG: No results for input(s): PHART, PCO2ART, PO2ART, HCO3, O2SAT in the last 168 hours.  Liver Function Tests: Recent Labs  Lab 05/24/18 0542  ALBUMIN 2.4*   No results for input(s): LIPASE, AMYLASE in the last 168 hours. No results for input(s): AMMONIA in the last 168 hours.  CBC: Recent Labs  Lab 05/24/18 0542 05/29/18 0558  WBC 8.7 10.3  HGB 8.5* 9.1*  HCT 28.3* 29.4*  MCV 103.7* 101.4*  PLT 325 317    Cardiac Enzymes: No results for input(s): CKTOTAL, CKMB, CKMBINDEX, TROPONINI in the last 168 hours.  BNP (last 3 results) Recent Labs    04/22/18 1430  BNP 433.0*     ProBNP (last 3 results) No results for input(s): PROBNP in the last 8760 hours.  Radiological Exams: No results found.  Assessment/Plan Active Problems:   Acute on chronic respiratory failure with hypoxia (HCC)   COPD, severe (HCC)   Pneumonia due to Pseudomonas aeruginosa (HCC)   Paroxysmal atrial fibrillation (HCC)   Delirium, acute   1. Acute on chronic respiratory failure with hypoxia continue capping as tolerated.  Patient will be transferred to inpatient rehab.  Continue pulmonary toilet secretion management 2. Severe COPD continue present therapy 3. Healthcare associated pneumonia with Pseudomonas continue current management 4. Atrial fibrillation rate controlled 5. Delirium continue present therapy   I have personally seen and evaluated the patient, evaluated laboratory and imaging results, formulated the assessment and plan and placed orders. The Patient requires high complexity decision making for assessment and support.  Case was discussed on Rounds with the Respiratory Therapy Staff  Allyne Gee, MD [facility] Pulmonary Critical Care Medicine Sleep Medicine

## 2018-05-30 NOTE — H&P (Signed)
Physical Medicine and Rehabilitation Admission H&P    CC: Debility   HPI: Kyle Lowery is a 68 year old male with history of CAD, severe COPD with ongoing tobacco use, depression who was admitted to [facility] on 04/22/2018 due to influenza A and H. influenzae with pulmonary filtrates, hypoxia, complicated by A. fib with RVR.  He was intubated and started on broad-spectrum antibiotics.  Hospital course significant for issues due to delirium with agitation, recurrent episodes of A flutter, ABLA, Pseudomonas/Viridans Strep HCAP and inability to tolerate extubation.  Tracheostomy was placed on 05/01/2018.  He was transferred to [facility] on 05/09/2018 for vent wean and started on ATC trials. He was started on capping trials.  He was treated with short course of cipro for Pseudomonas tracheobronchitis. He was downsized to #4 CFS on 4/20 and has increse in secretions which were malodorous sputum with repeat cultures positive for Pseudomonas, which is being monitored off antibiotics due to patient being afebrile and stable white count. Delirium being managed with use of Klonopin, Paxil, Seroquel and Haldol. He is tolerating PMSV and started on dysphagia 3 honey liquids on 4/17--> advanced to nectars by 4/21 but refusing liquids. Respiratory status stable and activity tolerance is improving. Therapy ongoing and CIR recommended due to functional deficits.  Please see preadmission assessment from earlier today as well.  Review of Systems  HENT: Negative for hearing loss and tinnitus.   Eyes: Negative for blurred vision and double vision.  Respiratory: Positive for shortness of breath. Negative for cough.   Cardiovascular: Negative for chest pain and palpitations.  Gastrointestinal: Positive for diarrhea. Negative for heartburn and nausea.  Genitourinary: Negative for dysuria and urgency.  Musculoskeletal: Positive for back pain (chronic).  Skin: Negative for itching.  Neurological: Positive for  weakness and headaches (chronic).  Psychiatric/Behavioral: Negative for hallucinations. The patient is not nervous/anxious.   All other systems reviewed and are negative.   Past Medical History:  Diagnosis Date   Acute on chronic respiratory failure with hypoxia (HCC)    Carotid artery stenosis    COPD (chronic obstructive pulmonary disease) (HCC)    COPD, severe (HCC)    Coronary atherosclerosis of native coronary artery    Mild to moderate NOCAD with 80% ostial diagonal 1/12   Delirium, acute    Depression    Prior suicide attempt   Essential hypertension, benign    GERD (gastroesophageal reflux disease)    Esophageal dilatation   Mixed hyperlipidemia    Paroxysmal atrial fibrillation (HCC)    Pneumonia due to Pseudomonas aeruginosa ([place])    Syncope    Neurocardiogenic    Past Surgical History:  Procedure Laterality Date   ESOPHAGOGASTRODUODENOSCOPY (EGD) WITH ESOPHAGEAL DILATION     INGUINAL HERNIA REPAIR     LEFT HEART CATH     Unable to Stent Blockage   Left orchiectomy     VASECTOMY      Family History  Problem Relation Age of Onset   Depression Father        Suicide age 60   Hypercholesterolemia Father    Hypertension Father    Hypertension Mother    Melanoma Mother    Hypertension Brother    Lung disease Neg Hx     Social History:  Married. Independent PTA. Used to work in Architect and other odd jobs. He reports that he has been smoking cigarettes--1PPD He started smoking about 54 years ago. He has a 53.00 pack-year smoking history. He has never  used smokeless tobacco. He reports that he does not drink alcohol or use drugs.    Allergies: No Known Allergies    Medications Prior to Admission  Medication Sig Dispense Refill   albuterol (PROVENTIL) (2.5 MG/3ML) 0.083% nebulizer solution Take 3 mLs (2.5 mg total) by nebulization every 4 (four) hours as needed for wheezing or shortness of breath. 75 mL 12   Amino Acids-Protein  Hydrolys (FEEDING SUPPLEMENT, PRO-STAT SUGAR FREE 64,) LIQD Place 30 mLs into feeding tube daily. 887 mL 0   apixaban (ELIQUIS) 5 MG TABS tablet Place 1 tablet (5 mg total) into feeding tube 2 (two) times daily. 60 tablet    arformoterol (BROVANA) 15 MCG/2ML NEBU Take 2 mLs (15 mcg total) by nebulization 2 (two) times daily. 120 mL    bisacodyl (DULCOLAX) 10 MG suppository Place 1 suppository (10 mg total) rectally daily as needed for moderate constipation. 12 suppository 0   budesonide (PULMICORT) 0.5 MG/2ML nebulizer solution Take 2 mLs (0.5 mg total) by nebulization 2 (two) times daily.  12   ceFEPIme 2 g in sodium chloride 0.9 % 100 mL Inject 2 g into the vein every 8 (eight) hours.     clonazePAM (KLONOPIN) 1 MG tablet Place 1 tablet (1 mg total) into feeding tube every 8 (eight) hours. 30 tablet 0   diltiazem (CARDIZEM) 10 mg/ml oral suspension Place 9 mLs (90 mg total) into feeding tube every 8 (eight) hours.     docusate (COLACE) 50 MG/5ML liquid Place 10 mLs (100 mg total) into feeding tube 2 (two) times daily as needed for mild constipation. 100 mL 0   ferrous sulfate 220 (44 Fe) MG/5ML solution Place 5 mLs (220 mg total) into feeding tube 2 (two) times daily with a meal. 970 mL 3   folic acid (FOLVITE) 1 MG tablet Place 1 tablet (1 mg total) into feeding tube daily.     ipratropium-albuterol (DUONEB) 0.5-2.5 (3) MG/3ML SOLN Take 3 mLs by nebulization every 6 (six) hours. 360 mL    metoprolol tartrate (LOPRESSOR) 25 mg/10 mL SUSP Place 20 mLs (50 mg total) into feeding tube every 8 (eight) hours for 5 days. 300 mL 0   Nutritional Supplements (FEEDING SUPPLEMENT, JEVITY 1.2 CAL,) LIQD Place 1,000 mLs into feeding tube continuous.  0   pantoprazole sodium (PROTONIX) 40 mg/20 mL PACK Place 20 mLs (40 mg total) into feeding tube daily at 12 noon. 30 each    PARoxetine (PAXIL) 20 MG tablet Place 1 tablet (20 mg total) into feeding tube daily.     pravastatin (PRAVACHOL) 20 MG  tablet Take 20 mg by mouth daily.     QUEtiapine (SEROQUEL) 50 MG tablet Place 1 tablet (50 mg total) into feeding tube at bedtime.     thiamine 100 MG tablet Place 1 tablet (100 mg total) into feeding tube daily.     vitamin C (VITAMIN C) 250 MG tablet Place 1 tablet (250 mg total) into feeding tube 2 (two) times daily.      Drug Regimen Review  Drug regimen was reviewed and remains appropriate with no significant issues identified  Home: Home Living Living Arrangements: Spouse/significant other Available Help at Discharge: Family, Available 24 hours/day, Friend(s) Type of Home: House Home Access: Stairs to enter [employer] of Steps: 4 Entrance Stairs-Rails: Can reach both Home Layout: One level Bathroom Shower/Tub: Chiropodist: Standard Bathroom Accessibility: Yes  Lives With: Spouse   Functional History:    Functional Status:  Mobility:  Ambulation/Gait Gait Distance (Feet): 40 Feet General Gait Details: min assist with RW    ADL:    Cognition: Cognition Overall Cognitive Status: Impaired/Different from baseline Orientation Level: Other (comment) Cognition Overall Cognitive Status: Impaired/Different from baseline  There were no vitals taken for this visit. Physical Exam  Nursing note and vitals reviewed. Constitutional: He appears well-developed. He appears cachectic. No distress.  HENT:  Head: Normocephalic and atraumatic.  + Trach plugged  Eyes: EOM are normal. Right eye exhibits no discharge. Left eye exhibits no discharge.  Neck:  CFS # 4 in place with button plug.   Respiratory: Effort normal. No respiratory distress.  GI: He exhibits no distension.  Musculoskeletal:     Comments: No edema or tenderness in extremities  Neurological: He is alert.  Oriented x2 Motor: Grossly 4+/5 throughout Dysphonia  Skin: Skin is warm and dry. He is not diaphoretic.  Seborrhea noted on nasal folds and in beard.     Psychiatric: His affect is blunt. His speech is delayed. He is slowed.    Results for orders placed or performed during the hospital encounter of 05/09/18 (from the past 48 hour(s))  CBC     Status: Abnormal   Collection Time: 05/29/18  5:58 AM  Result Value Ref Range   WBC 10.3 4.0 - 10.5 K/uL   RBC 2.90 (L) 4.22 - 5.81 MIL/uL   Hemoglobin 9.1 (L) 13.0 - 17.0 g/dL   HCT 29.4 (L) 39.0 - 52.0 %   MCV 101.4 (H) 80.0 - 100.0 fL   MCH 31.4 26.0 - 34.0 pg   MCHC 31.0 30.0 - 36.0 g/dL   RDW 16.2 (H) 11.5 - 15.5 %   Platelets 317 150 - 400 K/uL   nRBC 0.0 0.0 - 0.2 %    Comment: Performed at [facility], [address] [place], [place] [address]  Basic metabolic panel     Status: Abnormal   Collection Time: 05/29/18  5:58 AM  Result Value Ref Range   Sodium 141 135 - 145 mmol/L   Potassium 3.5 3.5 - 5.1 mmol/L   Chloride 104 98 - 111 mmol/L   CO2 25 22 - 32 mmol/L   Glucose, Bld 95 70 - 99 mg/dL   BUN 22 8 - 23 mg/dL   Creatinine, Ser 1.09 0.61 - 1.24 mg/dL   Calcium 8.7 (L) 8.9 - 10.3 mg/dL   GFR calc non Af Amer >60 >60 mL/min   GFR calc Af Amer >60 >60 mL/min   Anion gap 12 5 - 15    Comment: Performed at [facility] [address], [place], [place] [address]  Magnesium     Status: None   Collection Time: 05/29/18  5:58 AM  Result Value Ref Range   Magnesium 2.0 1.7 - 2.4 mg/dL    Comment: Performed at [facility] [address] [place], [place] [address]  Vancomycin, trough     Status: Abnormal   Collection Time: 05/29/18 10:22 AM  Result Value Ref Range   Vancomycin Tr <4 (L) 15 - 20 ug/mL    Comment: Performed at [facility], [place] [address] [place], [place] [address]   No results found.     Medical Problem List and Plan: 1.  Mobility deficits, limitations in endurance, limitation self-care, dysphagia secondary to debility.  Admit to CIR 2.  Antithrombotics: -DVT/anticoagulation:  Pharmaceutical: Other  (comment)--Eliquis  -antiplatelet therapy: N/A 3. Pain Management: Oxycodone prn 4. Mood:  LCSW to follow for evaluation and support.   -antipsychotic agents: Klonopin, and Seroquel.  Plan to wean medications as tolerated. 5. Neuropsych: This patient is not fully capable of making decisions on his own behalf. 6. Skin/Wound Care: Routine pressure relief measures. Continue prostat.  7. Fluids/Electrolytes/Nutrition: Monitor I/Os.  BMP ordered for tomorrow a.m. 8. COPD/hx of lung nodules: Continue duonebs prn. Was on symbicort in the past (2 years ago?).  Monitor respiratory rate with increased exertion. 9. HTN: Monitor BP bid- off medications at this time.  Monitor with increased mobility. 10. Delirium: See #4. 11.  Anemia : Continue iron supplement. Monitor for any signs of bleeding.  CBC ordered for tomorrow morning. 12. Dysphagia: On dysphagia 3, nectars. Maintaining adequate hydration. Will recheck BMET in am.  Advance diet as tolerated.   Post Admission Physician Evaluation: 1. Preadmission assessment reviewed and changes made below. 2. Functional deficits secondary  to debility. 3. Patient is admitted to receive collaborative, interdisciplinary care between the physiatrist, rehab nursing staff, and therapy team. 4. Patient's level of medical complexity and substantial therapy needs in context of that medical necessity cannot be provided at a lesser intensity of care such as a SNF. 5. Patient has experienced substantial functional loss from his/her baseline which was documented above under the "Functional History" and "Functional Status" headings.  Judging by the patient's diagnosis, physical exam, and functional history, the patient has potential for functional progress which will result in measurable gains while on inpatient rehab.  These gains will be of substantial and practical use upon discharge  in facilitating mobility and self-care at the household level. 6. Physiatrist will provide  24 hour management of medical needs as well as oversight of the therapy plan/treatment and provide guidance as appropriate regarding the interaction of the two. 7. 24 hour rehab nursing will assist with safety, skin/wound care, disease management, medication administration and patient education  and help integrate therapy concepts, techniques,education, etc. 8. PT will assess and treat for/with: Lower extremity strength, range of motion, stamina, balance, functional mobility, safety, adaptive techniques and equipment, coping skills, pain control, education. Goals are: Supervision/mod I. 9. OT will assess and treat for/with: ADL's, functional mobility, safety, upper extremity strength, adaptive techniques and equipment, ego support, and community reintegration.   Goals are: Supervision/mod I. Therapy may proceed with showering this patient. 10. SLP will assess and treat for/with: Speech, swallowing, cognition.  Goals are: Supervision/mod I. 11. Case Management and Social Worker will assess and treat for psychological issues and discharge planning. 12. Team conference will be held weekly to assess progress toward goals and to determine barriers to discharge. 13. Patient will receive at least 3 hours of therapy per day at least 5 days per week. 14. ELOS: 11-15 days.       15. Prognosis:  excellent  I have personally performed a face to face diagnostic evaluation, including, but not limited to relevant history and physical exam findings, of this patient and developed relevant assessment and plan.  Additionally, I have reviewed and concur with the physician assistant's documentation above.  Delice Lesch, MD, ABPMR Bary Leriche, PA-C 05/30/2018

## 2018-05-31 ENCOUNTER — Inpatient Hospital Stay (HOSPITAL_COMMUNITY): Payer: BLUE CROSS/BLUE SHIELD | Admitting: Physical Therapy

## 2018-05-31 ENCOUNTER — Inpatient Hospital Stay (HOSPITAL_COMMUNITY): Payer: BLUE CROSS/BLUE SHIELD | Admitting: Occupational Therapy

## 2018-05-31 ENCOUNTER — Inpatient Hospital Stay (HOSPITAL_COMMUNITY): Payer: BLUE CROSS/BLUE SHIELD | Admitting: Speech Pathology

## 2018-05-31 DIAGNOSIS — I10 Essential (primary) hypertension: Secondary | ICD-10-CM

## 2018-05-31 DIAGNOSIS — R1312 Dysphagia, oropharyngeal phase: Secondary | ICD-10-CM

## 2018-05-31 DIAGNOSIS — R5381 Other malaise: Principal | ICD-10-CM

## 2018-05-31 DIAGNOSIS — G9341 Metabolic encephalopathy: Secondary | ICD-10-CM

## 2018-05-31 LAB — CBC WITH DIFFERENTIAL/PLATELET
Abs Immature Granulocytes: 0.02 10*3/uL (ref 0.00–0.07)
Basophils Absolute: 0.1 10*3/uL (ref 0.0–0.1)
Basophils Relative: 1 %
Eosinophils Absolute: 0.2 10*3/uL (ref 0.0–0.5)
Eosinophils Relative: 3 %
HCT: 27 % — ABNORMAL LOW (ref 39.0–52.0)
Hemoglobin: 8.6 g/dL — ABNORMAL LOW (ref 13.0–17.0)
Immature Granulocytes: 0 %
Lymphocytes Relative: 21 %
Lymphs Abs: 1.6 10*3/uL (ref 0.7–4.0)
MCH: 32.5 pg (ref 26.0–34.0)
MCHC: 31.9 g/dL (ref 30.0–36.0)
MCV: 101.9 fL — ABNORMAL HIGH (ref 80.0–100.0)
Monocytes Absolute: 0.9 10*3/uL (ref 0.1–1.0)
Monocytes Relative: 11 %
Neutro Abs: 5.1 10*3/uL (ref 1.7–7.7)
Neutrophils Relative %: 64 %
Platelets: 298 10*3/uL (ref 150–400)
RBC: 2.65 MIL/uL — ABNORMAL LOW (ref 4.22–5.81)
RDW: 16.7 % — ABNORMAL HIGH (ref 11.5–15.5)
WBC: 7.9 10*3/uL (ref 4.0–10.5)
nRBC: 0 % (ref 0.0–0.2)

## 2018-05-31 LAB — COMPREHENSIVE METABOLIC PANEL
ALT: 15 U/L (ref 0–44)
AST: 16 U/L (ref 15–41)
Albumin: 2.4 g/dL — ABNORMAL LOW (ref 3.5–5.0)
Alkaline Phosphatase: 70 U/L (ref 38–126)
Anion gap: 10 (ref 5–15)
BUN: 22 mg/dL (ref 8–23)
CO2: 25 mmol/L (ref 22–32)
Calcium: 8.5 mg/dL — ABNORMAL LOW (ref 8.9–10.3)
Chloride: 107 mmol/L (ref 98–111)
Creatinine, Ser: 1.01 mg/dL (ref 0.61–1.24)
GFR calc Af Amer: >60 mL/min (ref >60)
GFR calc non Af Amer: >60 mL/min (ref >60)
Glucose, Bld: 97 mg/dL (ref 70–99)
Potassium: 3.4 mmol/L — ABNORMAL LOW (ref 3.5–5.1)
Sodium: 142 mmol/L (ref 135–145)
Total Bilirubin: 0.5 mg/dL (ref 0.3–1.2)
Total Protein: 6 g/dL — ABNORMAL LOW (ref 6.5–8.1)

## 2018-05-31 MED ORDER — POTASSIUM CHLORIDE CRYS ER 20 MEQ PO TBCR
20.0000 meq | EXTENDED_RELEASE_TABLET | Freq: Every day | ORAL | Status: DC
  Administered 2018-05-31 – 2018-06-04 (×5): 20 meq via ORAL
  Filled 2018-05-31 (×6): qty 1

## 2018-05-31 MED ORDER — LIDOCAINE HCL URETHRAL/MUCOSAL 2 % EX GEL
CUTANEOUS | Status: DC | PRN
  Administered 2018-06-04 – 2018-06-05 (×2): 5 via TOPICAL
  Filled 2018-05-31 (×9): qty 5
  Filled 2018-05-31: qty 10
  Filled 2018-05-31 (×4): qty 5

## 2018-05-31 MED ORDER — LIDOCAINE HCL URETHRAL/MUCOSAL 2 % EX GEL: 1.0000 "application " | Freq: Once | CUTANEOUS | Status: DC

## 2018-05-31 MED ORDER — LIDOCAINE HCL URETHRAL/MUCOSAL 2 % EX GEL
1.0000 "application " | Freq: Once | CUTANEOUS | Status: AC
  Administered 2018-05-31: 1 via TOPICAL

## 2018-05-31 NOTE — Discharge Instructions (Addendum)
Inpatient Rehab Discharge Instructions  [place] [facility] Discharge date and time:  06/11/18  Activities/Precautions/ Functional Status: Activity: no lifting, driving, or strenuous exercise till cleared by MD Diet: cardiac diet Wound Care: keep wound clean and dry    Functional status:  ___ No restrictions     ___ Walk up steps independently _X__ 24/7 supervision/assistance   ___ Walk up steps with assistance ___ Intermittent supervision/assistance  ___ Bathe/dress independently ___ Walk with walker     _X__ Bathe/dress with assistance ___ Walk Independently    ___ Shower independently ___ Walk with assistance    ___ Shower with assistance _X__ No alcohol     ___ Return to work/school ________    COMMUNITY REFERRALS UPON DISCHARGE:    Home Health:   PT     OT     ST                     Agency:  [facility]     Phone:  [phone]   Special Instructions: 1. Will need to cath at 4-5 times a day to keep bladder volumes less than 350 cc.    My questions have been answered and I understand these instructions. I will adhere to these goals and the provided educational materials after my discharge from the hospital.  Patient/Caregiver Signature _______________________________ Date __________  Clinician Signature _______________________________________ Date __________  Please bring this form and your medication list with you to all your follow-up doctor's appointments. Information on my medicine - ELIQUIS (apixaban)  Why was Eliquis prescribed for you? Eliquis was prescribed for you to reduce the risk of a blood clot forming that can cause a stroke if you have a medical condition called atrial fibrillation (a type of irregular heartbeat).  What do You need to know about Eliquis ? Take your Eliquis TWICE DAILY - one tablet in the morning and one tablet in the evening with or without food. If you have difficulty swallowing the tablet whole please discuss with your pharmacist  how to take the medication safely.  Take Eliquis exactly as prescribed by your doctor and DO NOT stop taking Eliquis without talking to the doctor who prescribed the medication.  Stopping may increase your risk of developing a stroke.  Refill your prescription before you run out.  After discharge, you should have regular check-up appointments with your healthcare provider that is prescribing your Eliquis.  In the future your dose may need to be changed if your kidney function or weight changes by a significant amount or as you get older.  What do you do if you miss a dose? If you miss a dose, take it as soon as you remember on the same day and resume taking twice daily.  Do not take more than one dose of ELIQUIS at the same time to make up a missed dose.  Important Safety Information A possible side effect of Eliquis is bleeding. You should call your healthcare provider right away if you experience any of the following: ? Bleeding from an injury or your nose that does not stop. ? Unusual colored urine (red or dark brown) or unusual colored stools (red or black). ? Unusual bruising for unknown reasons. ? A serious fall or if you hit your head (even if there is no bleeding).  Some medicines may interact with Eliquis and might increase your risk of bleeding or clotting while on Eliquis. To help avoid this, consult your healthcare provider or pharmacist prior to  using any new prescription or non-prescription medications, including herbals, vitamins, non-steroidal anti-inflammatory drugs (NSAIDs) and supplements.  This website has more information on Eliquis (apixaban): http://www.eliquis.com/eliquis/home

## 2018-05-31 NOTE — Evaluation (Signed)
Speech Language Pathology Assessment and Plan  Patient Details  Name: Kyle Lowery MRN: [id number] Date of Birth: 02/06/1951  SLP Diagnosis: Dysphagia;Speech and Language deficits;Cognitive Impairments  Rehab Potential: Good ELOS: 2 weeks     Today's Date: 05/31/2018 SLP Individual Time: 1015-1110 SLP Individual Time Calculation (min): 55 min   Problem List:  Patient Active Problem List   Diagnosis Date Noted  . Debility 05/30/2018  . Anemia   . Essential hypertension   . Chronic obstructive pulmonary disease ([place])   . Acute on chronic respiratory failure with hypoxia ([place])   . COPD, severe ([place])   . Pneumonia due to Pseudomonas aeruginosa ([place])   . Paroxysmal atrial fibrillation (HCC)   . Delirium, acute   . Malnutrition of moderate degree 04/24/2018  . Endotracheal tube present   . Ventilator dependent ([place])   . Influenzal pneumonia   . Acute exacerbation of chronic obstructive pulmonary disease (COPD) ([place])   . Pneumonia 04/22/2018  . Acute respiratory failure ([place]) 04/22/2018  . Multiple lung nodules on CT 02/12/2016  . Dysphagia 02/12/2016  . Depression 02/12/2016  . GERD (gastroesophageal reflux disease) 02/12/2016  . Pulmonary emphysema ([place]) 02/12/2016  . COPD, moderate ([place]) 04/22/2013  . Headache(784.0) 03/11/2011  . Vasovagal syncope 03/11/2011  . Tobacco use disorder 02/18/2010  . CORONARY ATHEROSCLEROSIS NATIVE CORONARY ARTERY 02/18/2010  . HYPERLIPIDEMIA 02/17/2010  . Essential hypertension, benign 02/17/2010   Past Medical History:  Past Medical History:  Diagnosis Date  . Acute on chronic respiratory failure with hypoxia ([place])   . Carotid artery stenosis   . COPD (chronic obstructive pulmonary disease) ([place])   . COPD, severe ([place])   . Coronary atherosclerosis of native coronary artery    Mild to moderate NOCAD with 80% ostial diagonal 1/12  . Delirium, acute   . Depression    Prior suicide attempt  . Essential hypertension, benign   . GERD  (gastroesophageal reflux disease)    Esophageal dilatation  . Mixed hyperlipidemia   . Paroxysmal atrial fibrillation (HCC)   . Pneumonia due to Pseudomonas aeruginosa ([place])   . Syncope    Neurocardiogenic   Past Surgical History:  Past Surgical History:  Procedure Laterality Date  . ESOPHAGOGASTRODUODENOSCOPY (EGD) WITH ESOPHAGEAL DILATION    . INGUINAL HERNIA REPAIR    . LEFT HEART CATH     Unable to Stent Blockage  . Left orchiectomy    . VASECTOMY      Assessment / Plan / Recommendation Clinical Impression Patient is a 68 year old male with history of CAD, severe COPD with ongoing tobacco use, depression who was admitted to [facility] on 04/22/2018 due to influenza A and H. influenzae with pulmonary filtrates, hypoxia, complicated by A. fib with RVR.  He was intubated and started on broad-spectrum antibiotics.  Hospital course significant for issues due to delirium with agitation, recurrent episodes of A flutter, ABLA, Pseudomonas/Viridans Strep HCAP and inability to tolerate extubation.  Tracheostomy was placed on 05/01/2018.  He was transferred to [facility] on 05/09/2018 for vent wean and started on ATC trials. He was started on capping trials.  He was treated with short course of cipro for Pseudomonas tracheobronchitis. He was downsized to #4 CFS on 4/20 and has increse in secretions which were malodorous sputum with repeat cultures positive for Pseudomonas, which is being monitored off antibiotics due to patient being afebrile and stable white count. Delirium being managed with use of Klonopin, Paxil, Seroquel and Haldol. Started on dysphagia 3 textures  with honey liquids on 4/17 and advanced to nectar-thick liquids by 4/21 but refusing liquids. Respiratory status stable and activity tolerance is improving. Therapy ongoing and CIR recommended due to functional deficits and patient admitted 05/30/18.  Patient demonstrates moderate-severe cognitive impairments impacting orientation,  sustained attention, intellectual awareness, functional problem solving and short-term recall which impacts his safety with functional and familiar tasks. Patient's safety is further exacerbated by generalized confusion with impulsivity and poor safety awareness.  Patient currently has a #4 cufless trach that is plugged with all vitals remaining WFL. However, speech intelligibility is impacted by patient's low vocal intensity due to mildly decreased breath support. Patient consumed solid textures and demonstrated prolonged mastication with multiple swallows with reports of food feeling "stuck." Therefore, patient downgraded to Dys. 2 textures to maximize safety and overall PO intake.  Patient consumed nectar-thick liquids with overt s/s of aspiration noted with large, sequential sips. Overt signs eliminated with small, single sips. Recommend patient continue nectar-thick liquids. Patient would benefit from skilled SLP intervention to maximize his swallowing, speech and cognitive functioning prior to discharge.     Skilled Therapeutic Interventions          Administered a cognitive-linguistic evaluation and BSE, please see above for details. Educated patient in regards to current swallowing, cognitive and speech deficits and goals of skilled SLP intervention, patient verbalized understanding.    SLP Assessment  Patient will need skilled Speech Lanaguage Pathology Services during CIR admission    Recommendations  SLP Diet Recommendations: Dysphagia 2 (Fine chop);Nectar Liquid Administration via: Cup Medication Administration: Crushed with puree Supervision: Patient able to self feed;Full supervision/cueing for compensatory strategies Compensations: Minimize environmental distractions;Slow rate;Small sips/bites;Multiple dry swallows after each bite/sip Postural Changes and/or Swallow Maneuvers: Out of bed for meals Oral Care Recommendations: Oral care BID Recommendations for Other Services: Neuropsych  consult Patient destination: Home Follow up Recommendations: 24 hour supervision/assistance;Home Health SLP Equipment Recommended: To be determined    SLP Frequency 3 to 5 out of 7 days   SLP Duration  SLP Intensity  SLP Treatment/Interventions 2 weeks   Minumum of 1-2 x/day, 30 to 90 minutes  Cognitive remediation/compensation;Environmental controls;Internal/external aids;Speech/Language facilitation;Therapeutic Activities;Patient/family education;Functional tasks;Dysphagia/aspiration precaution training;Cueing hierarchy    Pain No/Denies Pain   Short Term Goals: Week 1: SLP Short Term Goal 1 (Week 1): Patient will consume current diet with minimal overt s/s of aspiration with Min A verbal cues for use of swallowing compensatory strategies.  SLP Short Term Goal 2 (Week 1): Patient will consume trials of ice chips/thin liquids with minimal overt s/s of aspiration over 2 sessions ot assess readiness for repeat MBS SLP Short Term Goal 3 (Week 1): Patient will utilize an increased vocal intensity to achieve ~90% intelligibility at the sentence level with Min A verbal cues.  SLP Short Term Goal 4 (Week 1): Patient will orient to time, place and situation with Mod A multimodal cues.  SLP Short Term Goal 5 (Week 1): Patient will demonstrate sustained attention to functional tasks for ~10 minutes with Mod A verbal cues for redirection.  SLP Short Term Goal 6 (Week 1): Patient will demonstrate intellectual awareness of physical and cognitive deficits with Mod A verbal cues.   Refer to Care Plan for Long Term Goals  Recommendations for other services: Neuropsych  Discharge Criteria: Patient will be discharged from SLP if patient refuses treatment 3 consecutive times without medical reason, if treatment goals not met, if there is a change in medical status, if patient makes no progress  towards goals or if patient is discharged from hospital.  The above assessment, treatment plan, treatment  alternatives and goals were discussed and mutually agreed upon: by patient  Mirian Casco 05/31/2018, 2:42 PM

## 2018-05-31 NOTE — Progress Notes (Signed)
Pt has requested medication but each time I attempt to administer pt refuses bc he insist with water. Explained nectar thick liquid and purpose. Pt still refuses.

## 2018-05-31 NOTE — Progress Notes (Signed)
Pt really has not voided since coming to unit. Scanned pt with greater than 999. Had pt void and was only able to void 80ml. Will cath pt.

## 2018-05-31 NOTE — Evaluation (Signed)
Physical Therapy Assessment and Plan  Patient Details  Name: Kyle Lowery MRN: [id number] Date of Birth: 1950/09/11  PT Diagnosis: Abnormality of gait, Ataxic gait, Cognitive deficits, Difficulty walking, Impaired cognition and Muscle weakness Rehab Potential: Fair ELOS: 14-18 days   Today's Date: 05/31/2018 PT Individual Time: 0900-1000 PT Individual Time Calculation (min): 60 min    Problem List:  Patient Active Problem List   Diagnosis Date Noted  . Debility 05/30/2018  . Anemia   . Essential hypertension   . Chronic obstructive pulmonary disease ([place])   . Acute on chronic respiratory failure with hypoxia ([place])   . COPD, severe ([place])   . Pneumonia due to Pseudomonas aeruginosa ([place])   . Paroxysmal atrial fibrillation (HCC)   . Delirium, acute   . Malnutrition of moderate degree 04/24/2018  . Endotracheal tube present   . Ventilator dependent ([place])   . Influenzal pneumonia   . Acute exacerbation of chronic obstructive pulmonary disease (COPD) ([place])   . Pneumonia 04/22/2018  . Acute respiratory failure ([place]) 04/22/2018  . Multiple lung nodules on CT 02/12/2016  . Dysphagia 02/12/2016  . Depression 02/12/2016  . GERD (gastroesophageal reflux disease) 02/12/2016  . Pulmonary emphysema ([place]) 02/12/2016  . COPD, moderate ([place]) 04/22/2013  . Headache(784.0) 03/11/2011  . Vasovagal syncope 03/11/2011  . Tobacco use disorder 02/18/2010  . CORONARY ATHEROSCLEROSIS NATIVE CORONARY ARTERY 02/18/2010  . HYPERLIPIDEMIA 02/17/2010  . Essential hypertension, benign 02/17/2010    Past Medical History:  Past Medical History:  Diagnosis Date  . Acute on chronic respiratory failure with hypoxia ([place])   . Carotid artery stenosis   . COPD (chronic obstructive pulmonary disease) ([place])   . COPD, severe ([place])   . Coronary atherosclerosis of native coronary artery    Mild to moderate NOCAD with 80% ostial diagonal 1/12  . Delirium, acute   . Depression    Prior suicide attempt  .  Essential hypertension, benign   . GERD (gastroesophageal reflux disease)    Esophageal dilatation  . Mixed hyperlipidemia   . Paroxysmal atrial fibrillation (HCC)   . Pneumonia due to Pseudomonas aeruginosa ([place])   . Syncope    Neurocardiogenic   Past Surgical History:  Past Surgical History:  Procedure Laterality Date  . ESOPHAGOGASTRODUODENOSCOPY (EGD) WITH ESOPHAGEAL DILATION    . INGUINAL HERNIA REPAIR    . LEFT HEART CATH     Unable to Stent Blockage  . Left orchiectomy    . VASECTOMY      Assessment & Plan Clinical Impression: Patient is a 68 y.o. year old male with recent admission to the hospital on 04/22/2018 due to influenza A and H. influenzae with pulmonary filtrates, hypoxia, complicated by A. fib with RVR.  He was intubated and started on broad-spectrum antibiotics.  Hospital course significant for issues due to delirium with agitation, recurrent episodes of A flutter, ABLA, Pseudomonas/Viridans Strep HCAP and inability to tolerate extubation.  Tracheostomy was placed on 05/01/2018.  He was transferred to [facility] on 05/09/2018 for vent wean and started on ATC trials. He was started on capping trials.  He was treated with short course of cipro for Pseudomonas tracheobronchitis. He was downsized to #4 CFS on 4/20 and has increse in secretions which were malodorous sputum with repeat cultures positive for Pseudomonas, which is being monitored off antibiotics due to patient being afebrile and stable white count. Delirium being managed with use of Klonopin, Paxil, Seroquel and Haldol. He is tolerating PMSV and started on dysphagia 3 honey liquids  on 4/17--> advanced to nectars by 4/21 but refusing liquids. Respiratory status stable and activity tolerance is improving.   Patient transferred to CIR on 05/30/2018 .   Patient currently requires min with mobility secondary to muscle weakness, decreased cardiorespiratoy endurance and decreased oxygen support, impaired timing and sequencing, ataxia  and decreased coordination, decreased attention, decreased awareness, decreased problem solving and decreased safety awareness and decreased standing balance and decreased balance strategies.  Prior to hospitalization, patient was modified independent  with mobility and lived with Spouse in a House home.  Home access is 4Stairs to enter.  Patient will benefit from skilled PT intervention to maximize safe functional mobility, minimize fall risk and decrease caregiver burden for planned discharge home with 24 hour supervision.  Anticipate patient will benefit from follow up [place] at discharge.  PT - End of Session Activity Tolerance: Tolerates 30+ min activity with multiple rests Endurance Deficit: Yes PT Assessment Rehab Potential (ACUTE/IP ONLY): Fair PT Barriers to Discharge: Behavior PT Patient demonstrates impairments in the following area(s): Balance;Behavior;Endurance;Motor;Pain;Safety;Sensory PT Transfers Functional Problem(s): Bed Mobility;Bed to Chair;Car;Furniture;Floor PT Locomotion Functional Problem(s): Stairs;Wheelchair Mobility;Ambulation PT Plan PT Intensity: Minimum of 1-2 x/day ,45 to 90 minutes PT Frequency: 5 out of 7 days PT Duration Estimated Length of Stay: 14-18 days PT Treatment/Interventions: Ambulation/gait training;Community reintegration;DME/adaptive equipment instruction;Neuromuscular re-education;Stair training;UE/LE Strength taining/ROM;Wheelchair propulsion/positioning;UE/LE Coordination activities;Therapeutic Activities;Pain management;Balance/vestibular training;Discharge planning;Cognitive remediation/compensation;Functional mobility training;Patient/family education;Splinting/orthotics;Therapeutic Exercise PT Transfers Anticipated Outcome(s): supervision PT Locomotion Anticipated Outcome(s): supervision PT Recommendation Recommendations for Other Services: Neuropsych consult Follow Up Recommendations: Home health PT Patient destination: Home Equipment  Recommended: To be determined  Skilled Therapeutic Intervention Patient participated in skilled PT with encouragement and was resistant to PT education on PT POC and goals.  Pt with impulsive and agitated behavior throughout session requiring deescalation techniques throughout.  Pt able to perform gait with min A without device with mild ataxia, decreased awareness of LOB and impaired balance strategies.  Pt performs stairs with mod A for safety, mod/max cuing for attention to task and awareness of situation.  Pt perseverating on "taking my medicine", requiring max/total cuing to redirect.  Pt performs toileting in standing with mod A for 1 LOB. Pt left in recliner with safety belt on, needs at hand, nursing present.  PT Evaluation Precautions/Restrictions Precautions Precautions: Fall Restrictions Weight Bearing Restrictions: No General   Vital SignsOxygen Therapy SpO2: 100 % O2 Flow Rate (L/min): 2 L/min Pain Pain Assessment Pain Score: 0-No pain Home Living/Prior Functioning Home Living Available Help at Discharge: Family;Available 24 hours/day;Friend(s) Type of Home: House Home Access: Stairs to enter [employer] of Steps: 4 Additional Comments: information taken from acute chart, pt unwilling to give information stating "I'm not telling you anything"  Lives With: Spouse Prior Function Level of Independence: Independent with gait;Independent with transfers  Able to Take Stairs?: Yes 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 Overall Cognitive Status: Impaired/Different from baseline Arousal/Alertness: Awake/alert Orientation Level: Oriented to person;Disoriented to place;Disoriented to situation Awareness: Impaired Awareness Impairment: Intellectual impairment Behaviors: Impulsive;Physical agitation;Perseveration;Verbal agitation;Poor frustration tolerance Safety/Judgment: Impaired Sensation Sensation Light Touch: Appears  Intact Proprioception: Impaired by gross assessment Coordination Gross Motor Movements are Fluid and Coordinated: No Fine Motor Movements are Fluid and Coordinated: No Coordination and Movement Description: mild ataxia Motor  Motor Motor: Ataxia Motor - Skilled Clinical Observations: mild ataxia during gait  Mobility Bed Mobility Bed Mobility: Supine to Sit Supine to Sit: Supervision/Verbal cueing Transfers Transfers: Stand to Sit Stand to Sit: Minimal Assistance -  Patient > 75% Transfer (Assistive device): None Locomotion  Gait Ambulation: Yes Gait Assistance: Minimal Assistance - Patient > 75% Gait Distance (Feet): 150 Feet Assistive device: None Stairs / Additional Locomotion Stairs Assistance: Minimal Assistance - Patient > 75% Stair Management Technique: One rail Left Number of Stairs: 4 Wheelchair Mobility Wheelchair Mobility: No  Trunk/Postural Assessment  Cervical Assessment Cervical Assessment: (fwd head) Thoracic Assessment Thoracic Assessment: (mild kyphosis) Lumbar Assessment Lumbar Assessment: (posterior pelvic tilt) Postural Control Postural Control: Deficits on evaluation Righting Reactions: delayed and inadequate  Balance Dynamic Standing Balance Dynamic Standing - Comments: min/mod A Extremity Assessment      RLE Assessment General Strength Comments: grossly 3/5 LLE Assessment General Strength Comments: grossly 3/5    Refer to Care Plan for Long Term Goals  Recommendations for other services: Neuropsych  Discharge Criteria: Patient will be discharged from PT if patient refuses treatment 3 consecutive times without medical reason, if treatment goals not met, if there is a change in medical status, if patient makes no progress towards goals or if patient is discharged from hospital.  The above assessment, treatment plan, treatment alternatives and goals were discussed and mutually agreed upon: No family available/patient  unable  Nolie Bignell 05/31/2018, 10:09 AM

## 2018-05-31 NOTE — Evaluation (Signed)
Occupational Therapy Assessment and Plan  Patient Details  Name: Kyle Lowery MRN: [id number] Date of Birth: [birth year]  OT Diagnosis: abnormal posture, ataxia, cognitive deficits, muscle weakness (generalized) and coordination disorder Rehab Potential: Rehab Potential (ACUTE ONLY): Fair ELOS: 2 weeks   Today's Date: 05/31/2018 OT Individual Time: 2536-6440 OT Individual Time Calculation (min): 57 min     Problem List:  Patient Active Problem List   Diagnosis Date Noted  . Debility 05/30/2018  . Anemia   . Essential hypertension   . Chronic obstructive pulmonary disease ([place])   . Acute on chronic respiratory failure with hypoxia ([place])   . COPD, severe ([place])   . Pneumonia due to Pseudomonas aeruginosa ([place])   . Paroxysmal atrial fibrillation (HCC)   . Delirium, acute   . Malnutrition of moderate degree 04/24/2018  . Endotracheal tube present   . Ventilator dependent ([place])   . Influenzal pneumonia   . Acute exacerbation of chronic obstructive pulmonary disease (COPD) ([place])   . Pneumonia 04/22/2018  . Acute respiratory failure ([place]) 04/22/2018  . Multiple lung nodules on CT 02/12/2016  . Dysphagia 02/12/2016  . Depression 02/12/2016  . GERD (gastroesophageal reflux disease) 02/12/2016  . Pulmonary emphysema ([place]) 02/12/2016  . COPD, moderate ([place]) 04/22/2013  . Headache(784.0) 03/11/2011  . Vasovagal syncope 03/11/2011  . Tobacco use disorder 02/18/2010  . CORONARY ATHEROSCLEROSIS NATIVE CORONARY ARTERY 02/18/2010  . HYPERLIPIDEMIA 02/17/2010  . Essential hypertension, benign 02/17/2010    Past Medical History:  Past Medical History:  Diagnosis Date  . Acute on chronic respiratory failure with hypoxia ([place])   . Carotid artery stenosis   . COPD (chronic obstructive pulmonary disease) ([place])   . COPD, severe ([place])   . Coronary atherosclerosis of native coronary artery    Mild to moderate NOCAD with 80% ostial diagonal 1/12  . Delirium, acute   . Depression     Prior suicide attempt  . Essential hypertension, benign   . GERD (gastroesophageal reflux disease)    Esophageal dilatation  . Mixed hyperlipidemia   . Paroxysmal atrial fibrillation (HCC)   . Pneumonia due to Pseudomonas aeruginosa ([place])   . Syncope    Neurocardiogenic   Past Surgical History:  Past Surgical History:  Procedure Laterality Date  . ESOPHAGOGASTRODUODENOSCOPY (EGD) WITH ESOPHAGEAL DILATION    . INGUINAL HERNIA REPAIR    . LEFT HEART CATH     Unable to Stent Blockage  . Left orchiectomy    . VASECTOMY      Assessment & Plan Clinical Impression: Patient is a 68 y.o. year old male with history of CAD, severe COPD with ongoing tobacco use, depression who was admitted to [facility] on 04/22/2018 due to influenza A and H. influenzae with pulmonary filtrates, hypoxia, complicated by A. fib with RVR.  He was intubated and started on broad-spectrum antibiotics.  Hospital course significant for issues due to delirium with agitation, recurrent episodes of A flutter, ABLA, Pseudomonas/Viridans Strep HCAP and inability to tolerate extubation.  Tracheostomy was placed on 05/01/2018.  He was transferred to [facility] on 05/09/2018 for vent wean and started on ATC trials. He was started on capping trials.  He was treated with short course of cipro for Pseudomonas tracheobronchitis. He was downsized to #4 CFS on 4/20 and has increse in secretions which were malodorous sputum with repeat cultures positive for Pseudomonas, which is being monitored off antibiotics due to patient being afebrile and stable white count. Delirium being managed with use of Klonopin, Paxil,  Seroquel and Haldol. He is tolerating PMSV and started on dysphagia 3 honey liquids on 4/17--> advanced to nectars by 4/21 but refusing liquids. Respiratory status stable and activity tolerance is improving. Therapy ongoing and CIR recommended due to functional deficits.  Please see preadmission assessment from earlier today as well.   Patient transferred to CIR on 05/30/2018 .    Patient currently requires min - max A with basic self-care skills and IADL secondary to muscle weakness, decreased cardiorespiratoy endurance and decreased oxygen support, ataxia, decreased coordination and decreased motor planning, decreased initiation, decreased attention, decreased awareness, decreased problem solving and decreased safety awareness and decreased sitting balance, decreased standing balance, decreased postural control and decreased balance strategies.  Prior to hospitalization, patient could complete ADLs and IADLs with independent .  Patient will benefit from skilled intervention to decrease level of assist with basic self-care skills prior to discharge home with care partner.  Anticipate patient will require 24 hour supervision and follow up home health.  OT - End of Session Activity Tolerance: Decreased this session Endurance Deficit: Yes Endurance Deficit Description: multiple rest breaks secondary to fatigue OT Assessment Rehab Potential (ACUTE ONLY): Fair OT Barriers to Discharge: Other (comments) OT Barriers to Discharge Comments: none known at this time OT Patient demonstrates impairments in the following area(s): Balance;Cognition;Endurance;Motor;Pain;Perception;Safety OT Basic ADL's Functional Problem(s): Grooming;Bathing;Dressing;Toileting OT Transfers Functional Problem(s): Toilet;Tub/Shower OT Additional Impairment(s): None OT Plan OT Intensity: Minimum of 1-2 x/day, 45 to 90 minutes OT Frequency: 5 out of 7 days OT Duration/Estimated Length of Stay: 2 weeks OT Treatment/Interventions: Training and development officer;Wheelchair propulsion/positioning;Therapeutic Exercise;Self Care/advanced ADL retraining;Neuromuscular re-education;DME/adaptive equipment instruction;UE/LE Strength taining/ROM;Cognitive remediation/compensation;Pain management;Community reintegration;Functional electrical stimulation;Patient/family  education;UE/LE Coordination activities;Discharge planning;Functional mobility training;Psychosocial support;Therapeutic Activities OT Self Feeding Anticipated Outcome(s): n/a OT Basic Self-Care Anticipated Outcome(s): supervision overall OT Toileting Anticipated Outcome(s): supervision overall OT Bathroom Transfers Anticipated Outcome(s): supervision overall OT Recommendation Recommendations for Other Services: Neuropsych consult Patient destination: Home Follow Up Recommendations: 24 hour supervision/assistance;Home health OT Equipment Recommended: To be determined   Skilled Therapeutic Intervention Upon entering the room, pt supine in bed with RN present in room. Pt requiring increased time and encouragement for participation this session. Pt requesting to shave but then needing cuing for over 10 minutes to redirect to task. Pt on 2 L via [place] this session without saturation dropping below 95%. Pt standing and ambulating without AD with min A to stand at sink. Pt gets upset when you touch him to assist with balance. Pt needing seated rest break secondary to fatigue with standing. Pt donning pull over jacket with max A but refusing bathing this session. OT provided education regarding OT purpose, POC, and goals with pt verbalizing understanding. Pt returning to bed at end of session with bed alarm activated and call bell within reach.   OT Evaluation Precautions/Restrictions  Precautions Precautions: Fall Precaution Comments: 39m cuffless trach, capped since 4/18 Restrictions Weight Bearing Restrictions: No Vital Signs Therapy Vitals Temp: 98.1 F (36.7 C) Pulse Rate: 69 Resp: 18 BP: (!) 136/58 Oxygen Therapy SpO2: 99 % O2 Device: Room Air Pain Pain Assessment Pain Scale: 0-10 Pain Score: 0-No pain Home Living/Prior Functioning Home Living Family/patient expects to be discharged to:: Private residence Living Arrangements: Spouse/significant other Available Help at Discharge:  Family, Available 24 hours/day, Friend(s) Type of Home: House Home Access: Stairs to enter [employer] Steps: 4 Entrance Stairs-Rails: Can reach both Home Layout: One level Bathroom Shower/Tub: TChiropodist Standard Bathroom Accessibility: Yes Additional Comments: information taken  from acute chart, pt appearing to be very confused when and contradiciting self with home questions  Lives With: Spouse Prior Function Level of Independence: Independent with gait, Independent with transfers, Independent with basic ADLs, Independent with homemaking with ambulation  Able to Take Stairs?: Yes 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, community Baseline Vision/History: Wears glasses Wears Glasses: At all times Patient Visual Report: No change from baseline;Other (comment)(does not have glasses at hospital with him) Cognition Overall Cognitive Status: Impaired/Different from baseline Arousal/Alertness: Awake/alert Orientation Level: Person;Place Year: 2020 Month: (march) Day of Week: Incorrect Memory: Impaired Memory Impairment: Decreased recall of new information;Decreased short term memory Decreased Short Term Memory: Verbal basic;Functional basic Immediate Memory Recall: Sock Memory Recall: (0/3) Attention: Focused;Sustained Focused Attention: Appears intact Sustained Attention: Impaired Sustained Attention Impairment: Verbal basic;Functional basic Awareness: Impaired Awareness Impairment: Intellectual impairment Problem Solving: Impaired Problem Solving Impairment: Verbal basic;Functional basic Behaviors: Impulsive;Poor frustration tolerance Safety/Judgment: Impaired Sensation Sensation Light Touch: Appears Intact Coordination Gross Motor Movements are Fluid and Coordinated: No Fine Motor Movements are Fluid and Coordinated: No Coordination and Movement Description: mild ataxia Motor  Motor Motor: Ataxia Motor - Skilled Clinical  Observations: mild ataxia and generalized weakness Mobility  Bed Mobility Bed Mobility: Supine to Sit Supine to Sit: Supervision/Verbal cueing Transfers Stand to Sit: Minimal Assistance - Patient > 75%  Trunk/Postural Assessment  Cervical Assessment Cervical Assessment: Exceptions to WFL(forward head) Thoracic Assessment Thoracic Assessment: Exceptions to WFL(kyphotic) Lumbar Assessment Lumbar Assessment: Exceptions to WFL(posterior pelvic tilt) Postural Control Postural Control: Deficits on evaluation  Balance Balance Balance Assessed: Yes Dynamic Standing Balance Dynamic Standing - Comments: min - mod A Extremity/Trunk Assessment RUE Assessment RUE Assessment: Exceptions to [facility] General Strength Comments: 3+/5 generalized weakness LUE Assessment LUE Assessment: Exceptions to [facility] General Strength Comments: 3+/5 generalized weakness     Refer to Care Plan for Long Term Goals  Recommendations for other services: Neuropsych   Discharge Criteria: Patient will be discharged from OT if patient refuses treatment 3 consecutive times without medical reason, if treatment goals not met, if there is a change in medical status, if patient makes no progress towards goals or if patient is discharged from hospital.  The above assessment, treatment plan, treatment alternatives and goals were discussed and mutually agreed upon: by patient  Gypsy Decant 05/31/2018, 4:54 PM

## 2018-05-31 NOTE — Progress Notes (Signed)
Patient information reviewed and entered into eRehab System by Becky Angelynn Lemus, PPS coordinator. Information including medical coding, function ability, and quality indicators will be reviewed and updated through discharge.   

## 2018-05-31 NOTE — Progress Notes (Signed)
Placed on Tele-sitter, some agitation noted, redirected , call bell within reach, no acute respiratory distress ir discomfort, Trach remains capped,

## 2018-05-31 NOTE — Progress Notes (Signed)
Pt has taken some of medications but refusing some with coaching from speech

## 2018-05-31 NOTE — Progress Notes (Signed)
[facility] PHYSICAL MEDICINE & REHABILITATION PROGRESS NOTE   Subjective/Complaints: Up in bed. No apparent issues this morning. Agitated, irritable  ROS: Limited due to cognitive/behavioral    Objective:   No results found. Recent Labs    05/29/18 0558 05/31/18 0510  WBC 10.3 7.9  HGB 9.1* 8.6*  HCT 29.4* 27.0*  PLT 317 298   Recent Labs    05/29/18 0558 05/31/18 0510  NA 141 142  K 3.5 3.4*  CL 104 107  CO2 25 25  GLUCOSE 95 97  BUN 22 22  CREATININE 1.09 1.01  CALCIUM 8.7* 8.5*   No intake or output data in the 24 hours ending 05/31/18 0904   Physical Exam: Vital Signs Blood pressure (!) 128/51, pulse 68, temperature 98.2 F (36.8 C), temperature source Oral, resp. rate 20, height 5\' 8"  (1.727 m), weight 56.4 kg, SpO2 100 %. Constitutional: No distress . Vital signs reviewed. HEENT: EOMI, oral membranes moist Neck: supple, #4 trach with plug.  Cardiovascular: RRR without murmur. No JVD    Respiratory: CTA Bilaterally without wheezes or rales. Normal effort    GI: BS +, non-tender, non-distended  Musculoskeletal:     Comments: No edema or tenderness in extremities  Neurological: He is alert.  Oriented x2 Motor: Grossly 4+/5 throughout--won't cooperate with exam dysphonic Skin: Skin is warm and dry. He is not diaphoretic.  Seborrhea noted on nasal folds and in beard.   Psychiatric: irritable and uncooperative.     Assessment/Plan: 1. Functional deficits secondary to debility/encephalopathy which require 3+ hours per day of interdisciplinary therapy in a comprehensive inpatient rehab setting.  Physiatrist is providing close team supervision and 24 hour management of active medical problems listed below.  Physiatrist and rehab team continue to assess barriers to discharge/monitor patient progress toward functional and medical goals  Care Tool:  Bathing              Bathing assist       Upper Body Dressing/Undressing Upper body dressing         Upper body assist      Lower Body Dressing/Undressing Lower body dressing            Lower body assist       Toileting Toileting    Toileting assist Assist for toileting: Minimal Assistance - Patient > 75%     Transfers Chair/bed transfer  Transfers assist  Chair/bed transfer activity did not occur: Safety/medical concerns  Chair/bed transfer assist level: Moderate Assistance - Patient 50 - 74%     Locomotion Ambulation   Ambulation assist              Walk 10 feet activity   Assist           Walk 50 feet activity   Assist           Walk 150 feet activity   Assist           Walk 10 feet on uneven surface  activity   Assist           Wheelchair     Assist               Wheelchair 50 feet with 2 turns activity    Assist            Wheelchair 150 feet activity     Assist          Medical Problem List and Plan: 1.  Mobility deficits, limitations in endurance,  limitation self-care, dysphagia secondary to debility.             -Patient is beginning CIR therapies today including PT and OT SLP 2.  Antithrombotics: -DVT/anticoagulation:  Pharmaceutical: Other (comment)--Eliquis             -antiplatelet therapy: N/A 3. Pain Management: Oxycodone prn 4. Mood: LCSW to follow for evaluation and support.              -antipsychotic agents: Klonopin, and Seroquel.  Plan to wean medications as tolerated but not today 5. Neuropsych: This patient is not fully capable of making decisions on his own behalf. 6. Skin/Wound Care: Routine pressure relief measures. Continue prostat.  7. Fluids/Electrolytes/Nutrition: encourage PO  -I personally reviewed the patient's labs today.    -protein supp for low albumin  -replete K+ 8. COPD/hx of lung nodules: Continue duonebs prn. Was on symbicort in the past (2 years ago?).  Monitor respiratory rate with increased exertion. 9. HTN: Monitor BP bid- off medications  at this time.  Monitor with increased mobility. 10. Delirium: See #4. 11.  Anemia : Continue iron supplement.   -hgb 8.6  -no clinical signs of bleeding at present  -recheck Monday  12. Dysphagia: On dysphagia 3, nectars.   -encourage fluids  -labs normal today   LOS: 1 days A FACE TO FACE EVALUATION WAS PERFORMED  Meredith Staggers 05/31/2018, 9:04 AM

## 2018-06-01 ENCOUNTER — Inpatient Hospital Stay (HOSPITAL_COMMUNITY): Payer: BLUE CROSS/BLUE SHIELD | Admitting: Physical Therapy

## 2018-06-01 ENCOUNTER — Inpatient Hospital Stay (HOSPITAL_COMMUNITY): Payer: BLUE CROSS/BLUE SHIELD

## 2018-06-01 ENCOUNTER — Inpatient Hospital Stay (HOSPITAL_COMMUNITY): Payer: BLUE CROSS/BLUE SHIELD | Admitting: Speech Pathology

## 2018-06-01 DIAGNOSIS — K224 Dyskinesia of esophagus: Secondary | ICD-10-CM

## 2018-06-01 MED ORDER — ISOSORBIDE MONONITRATE ER 30 MG PO TB24
15.0000 mg | ORAL_TABLET | Freq: Every day | ORAL | Status: DC
  Administered 2018-06-01 – 2018-06-11 (×11): 15 mg via ORAL
  Filled 2018-06-01 (×11): qty 1

## 2018-06-01 MED ORDER — HYDROCORTISONE 1 % EX CREA
TOPICAL_CREAM | Freq: Two times a day (BID) | CUTANEOUS | Status: DC
  Administered 2018-06-01: 1 via TOPICAL
  Administered 2018-06-01 – 2018-06-02 (×2): via TOPICAL
  Administered 2018-06-02: 1 via TOPICAL
  Administered 2018-06-03 – 2018-06-10 (×16): via TOPICAL
  Filled 2018-06-01 (×2): qty 28

## 2018-06-01 MED ORDER — PANTOPRAZOLE SODIUM 40 MG PO TBEC
40.0000 mg | DELAYED_RELEASE_TABLET | Freq: Two times a day (BID) | ORAL | Status: DC
  Administered 2018-06-01 – 2018-06-11 (×21): 40 mg via ORAL
  Filled 2018-06-01 (×21): qty 1

## 2018-06-01 MED ORDER — TAMSULOSIN HCL 0.4 MG PO CAPS
0.4000 mg | ORAL_CAPSULE | Freq: Every day | ORAL | Status: DC
  Administered 2018-06-01 – 2018-06-08 (×8): 0.4 mg via ORAL
  Filled 2018-06-01 (×8): qty 1

## 2018-06-01 NOTE — Care Management (Signed)
[facility] Individual Statement of Services  Patient Name:  Kyle Lowery  Date:  06/01/2018  Welcome to the [facility].  Our goal is to provide you with an individualized program based on your diagnosis and situation, designed to meet your specific needs.  With this comprehensive rehabilitation program, you will be expected to participate in at least 3 hours of rehabilitation therapies Monday-Friday, with modified therapy programming on the weekends.  Your rehabilitation program will include the following services:  Physical Therapy (PT), Occupational Therapy (OT), Speech Therapy (ST), 24 hour per day rehabilitation nursing, Therapeutic Recreaction (TR), Neuropsychology, Case Management (Social Worker), Rehabilitation Medicine, Nutrition Services and Pharmacy Services  Weekly team conferences will be held on Tuesdays to discuss your progress.  Your Social Worker will talk with you frequently to get your input and to update you on team discussions.  Team conferences with you and your family in attendance may also be held.  Expected length of stay: 2 weeks Overall anticipated outcome: supervision  Depending on your progress and recovery, your program may change. Your Social Worker will coordinate services and will keep you informed of any changes. Your Social Worker's name and contact numbers are listed  below.  The following services may also be recommended but are not provided by the [facility] will be made to provide these services after discharge if needed.  Arrangements include referral to agencies that provide these services.  Your insurance has been verified to be:  [employer] of [place]; Medicare Your primary doctor is:  Legrand Rams  Pertinent information will be shared with your doctor and your insurance company.  Social  Worker:  [place], [place] or ([phone]   Information discussed with and copy given to patient by: Lennart Pall, 06/01/2018, 4:43 PM

## 2018-06-01 NOTE — Progress Notes (Addendum)
Physical Therapy Session Note  Patient Details  Name: Kyle Lowery MRN: [id number] Date of Birth: 06/09/1950  Today's Date: 06/01/2018 PT Individual Time: 2094-7096 PT Individual Time Calculation (min): 72 min   Short Term Goals: Week 1:  PT Short Term Goal 1 (Week 1): pt will demonstrate sustained attention to therapeutic task x 5 minutes with min A PT Short Term Goal 2 (Week 1): pt will perform gait in controlled environment x 150' with close supervision  Skilled Therapeutic Interventions/Progress Updates:  Pt received asleep in bed but easily awakened & agreeable to tx. Pt transfers to EOB with supervision and hospital bed features. Therapist provides cuing for orientation regarding location & situation. Pt transfers bed>w/c via stand pivot with min assist. Pt stood to attempt to button pants but has LOB and uses LUE supported on bed & min assist to correct. Nasal cannula removed & pt on room air throughout session with SpO2 remaining >90% throughout. Pt ambulates 70 ft + 100 ft with min assist without AD with impaired step width & length BLE, and lateral sway and decreased weight shifting to R. In gym, pt performs 3" step taps, progressing to cones, with min assist for balance with task focusing on weight shifting L<>R, BLE coordination, and dynamic balance. Pt engaged in trampoline ball toss with wide then normal BOS with CGA<>min assist for balance with task focusing on standing balance & endurance & pt experiences 1 posterior LOB requiring therapist assistance to correct. Pt utilized cybex kinetron in sitting then standing with BUE support with task focusing on BLE strengthening and weight shifting L<>R with cuing but fair<>poor demo of upright standing posture.  At end of session pt left sitting in recliner with chair alarm donned & all needs at hand, RN supervising pt consuming thickened liquids.   Left pt on room air at end of session with SpO2 >90%.  Therapy Documentation Precautions:   Precautions Precautions: Fall Precaution Comments: 47mm cuffless trach, capped since 4/18 Restrictions Weight Bearing Restrictions: No  Pain: Pt c/o B hip pain & RN made aware & administered meds during session.   Therapy/Group: Individual Therapy  Waunita Schooner 06/01/2018, 3:39 PM

## 2018-06-01 NOTE — IPOC Note (Signed)
Overall Plan of Care [facility]) Patient Details Name: BILLEY WOJCIAK MRN: [id number] DOB: 02/01/1951  Admitting Diagnosis: <principal problem not specified>  Hospital Problems: Active Problems:   Debility     Functional Problem List: Nursing Bladder, Bowel, Skin Integrity, Safety, Endurance, Pain, Motor  PT Balance, Behavior, Endurance, Motor, Pain, Safety, Sensory  OT Balance, Cognition, Endurance, Motor, Pain, Perception, Safety  SLP Cognition, Linguistic  TR         Basic ADL's: OT Grooming, Bathing, Dressing, Toileting     Advanced  ADL's: OT       Transfers: PT Bed Mobility, Bed to Chair, Car, [employer], Futures trader, Tub/Shower     Locomotion: PT Stairs, Emergency planning/management officer, Ambulation     Additional Impairments: OT None  SLP Swallowing, Communication, Social Cognition expression Social Interaction, Problem Solving, Attention, Memory, Awareness  TR      Anticipated Outcomes Item Anticipated Outcome  Self Feeding n/a  Swallowing  Supervision    Basic self-care  supervision overall  Toileting  supervision overall   Bathroom Transfers supervision overall  Bowel/Bladder  Pt will manage bowel and bladder with mod assist   Transfers  supervision  Locomotion  supervision  Communication  Supervision  Cognition  Min A   Pain  Pt will manage pain at 3 or less on a scale of 0-10.   Safety/Judgment  Pt will follow safety plan in place with min assist/cues.    Therapy Plan: PT Intensity: Minimum of 1-2 x/day ,45 to 90 minutes PT Frequency: 5 out of 7 days PT Duration Estimated Length of Stay: 14-18 days OT Intensity: Minimum of 1-2 x/day, 45 to 90 minutes OT Frequency: 5 out of 7 days OT Duration/Estimated Length of Stay: 2 weeks SLP Intensity: Minumum of 1-2 x/day, 30 to 90 minutes SLP Frequency: 3 to 5 out of 7 days SLP Duration/Estimated Length of Stay: 2 weeks    Due to the current state of emergency, patients may not be receiving their  3-hours of Medicare-mandated therapy.   Team Interventions: Nursing Interventions Patient/Family Education, Bladder Management, Bowel Management, Disease Management/Prevention, Skin Care/Wound Management, Dysphagia/Aspiration Precaution Training, Cognitive Remediation/Compensation  PT interventions Ambulation/gait training, Community reintegration, DME/adaptive equipment instruction, Neuromuscular re-education, Stair training, UE/LE Strength taining/ROM, Wheelchair propulsion/positioning, UE/LE Coordination activities, Therapeutic Activities, Pain management, Training and development officer, Discharge planning, Cognitive remediation/compensation, Functional mobility training, Patient/family education, Splinting/orthotics, Therapeutic Exercise  OT Interventions Training and development officer, Wheelchair propulsion/positioning, Therapeutic Exercise, Self Care/advanced ADL retraining, Neuromuscular re-education, DME/adaptive equipment instruction, UE/LE Strength taining/ROM, Cognitive remediation/compensation, Pain management, Community reintegration, Functional electrical stimulation, Patient/family education, UE/LE Coordination activities, Discharge planning, Functional mobility training, Psychosocial support, Therapeutic Activities  SLP Interventions Cognitive remediation/compensation, Environmental controls, Internal/external aids, Speech/Language facilitation, Therapeutic Activities, Patient/family education, Functional tasks, Dysphagia/aspiration precaution training, Cueing hierarchy  TR Interventions    SW/CM Interventions Discharge Planning, Psychosocial Support, Patient/Family Education   Barriers to Discharge MD  Medical stability  Nursing      PT Behavior    OT Other (comments) none known at this time  SLP      SW       Team Discharge Planning: Destination: PT-Home ,OT- Home , SLP-Home Projected Follow-up: PT-Home health PT, OT-  24 hour supervision/assistance, Home health OT, SLP-24 hour  supervision/assistance, Home Health SLP Projected Equipment Needs: PT-To be determined, OT- To be determined, SLP-To be determined Equipment Details: PT- , OT-  Patient/family involved in discharge planning: PT- Patient unable/family or caregiver not available,  OT-Patient, SLP-Patient  MD ELOS: 13-16 days Medical  Rehab Prognosis:  Excellent Assessment: The patient has been admitted for CIR therapies with the diagnosis of debility and encephalopathy. The team will be addressing functional mobility, strength, stamina, balance, safety, adaptive techniques and equipment, self-care, bowel and bladder mgt, patient and caregiver education, NMR, trach care/communication, cognition, swallowing, community reentry. Goals have been set at supervision for self-care and mobility and supervision to min assist with cognition, communication and swallowing.   Due to the current state of emergency, patients may not be receiving their 3 hours per day of Medicare-mandated therapy.    Meredith Staggers, MD, FAAPMR      See Team Conference Notes for weekly updates to the plan of care

## 2018-06-01 NOTE — Progress Notes (Signed)
Social Work  Social Work Assessment and Plan  Patient Details  Name: Kyle Lowery MRN: [id number] Date of Birth: 1950-05-20  Today's Date: 06/01/2018  Problem List:  Patient Active Problem List   Diagnosis Date Noted  . Debility 05/30/2018  . Anemia   . Essential hypertension   . Chronic obstructive pulmonary disease ([place])   . Acute on chronic respiratory failure with hypoxia ([place])   . COPD, severe ([place])   . Pneumonia due to Pseudomonas aeruginosa ([place])   . Paroxysmal atrial fibrillation (H[facility])   . Delirium, acute   . Malnutrition of moderate degree 04/24/2018  . Endotracheal tube present   . Ventilator dependent ([place])   . Influenzal pneumonia   . Acute exacerbation of chronic obstructive pulmonary disease (COPD) ([place])   . Pneumonia 04/22/2018  . Acute respiratory failure ([place]) 04/22/2018  . Multiple lung nodules on CT 02/12/2016  . Dysphagia 02/12/2016  . Depression 02/12/2016  . GERD (gastroesophageal reflux disease) 02/12/2016  . Pulmonary emphysema ([place]) 02/12/2016  . COPD, moderate ([place]) 04/22/2013  . Headache(784.0) 03/11/2011  . Vasovagal syncope 03/11/2011  . Tobacco use disorder 02/18/2010  . CORONARY ATHEROSCLEROSIS NATIVE CORONARY ARTERY 02/18/2010  . HYPERLIPIDEMIA 02/17/2010  . Essential hypertension, benign 02/17/2010   Past Medical History:  Past Medical History:  Diagnosis Date  . Acute on chronic respiratory failure with hypoxia ([place])   . Carotid artery stenosis   . COPD (chronic obstructive pulmonary disease) ([place])   . COPD, severe ([place])   . Coronary atherosclerosis of native coronary artery    Mild to moderate NOCAD with 80% ostial diagonal 1/12  . Delirium, acute   . Depression    Prior suicide attempt  . Essential hypertension, benign   . GERD (gastroesophageal reflux disease)    Esophageal dilatation  . Mixed hyperlipidemia   . Paroxysmal atrial fibrillation (HCC)   . Pneumonia due to Pseudomonas aeruginosa ([place])   . Syncope    Neurocardiogenic   Past Surgical History:  Past Surgical History:  Procedure Laterality Date  . ESOPHAGOGASTRODUODENOSCOPY (EGD) WITH ESOPHAGEAL DILATION    . INGUINAL HERNIA REPAIR    . LEFT HEART CATH     Unable to Stent Blockage  . Left orchiectomy    . VASECTOMY     Social History:  reports that he has been smoking cigarettes. He started smoking about 54 years ago. He has a 53.00 pack-year smoking history. He has never used smokeless tobacco. He reports that he does not drink alcohol or use drugs.  Family / Support Systems Marital Status: Married How Long?: 13 yrs (2nd marriage) Patient Roles: Spouse, Parent Spouse/Significant Other: wife, Stephannie Peters @ (C) [phone] Children: Pt has one son, Rodman Key, living in [place], [place].  Wife with 3 daughters all living within close proximity to them, Other Supports: pt's brother, Taggart Prasad @ [phone] Anticipated Caregiver: wife, Baldo Ash Ability/Limitations of Caregiver: works nights, but has arranged for friends to stay with pt while she's at work Caregiver Availability: 24/7 Family Dynamics: Patient describes wife and her daughters as very supportive.  Wife notes that all 3 of her daughters work in Chief Executive [employer] and live very close by and are willing to assist in any way.  Social History Preferred language: English Religion: Unknown Cultural Background: N/A Education: HS Read: Yes Write: Yes Employment Status: Disabled Date Retired/Disabled/Unemployed: ~13 yrs Legal History/Current Legal Issues: None Guardian/Conservator: NOne - per MD, pt is not yet fully capable of making decisions on his own behalf - defer to  spouse.   Abuse/Neglect Abuse/Neglect Assessment Can Be Completed: Yes Physical Abuse: Denies Verbal Abuse: Denies Sexual Abuse: Denies Exploitation of patient/patient's resources: Denies Self-Neglect: Denies  Emotional Status Pt's affect, behavior and adjustment status: Patient sitting up in  recliner and making good attempts to answer assessment questions, however, the responses seem confused at times.  Patient denies being in any significant emotional distress.  Patient is eager to return home as soon as possible.  We will monitor mood while here and referral for neuropsychology as indicated. Recent Psychosocial Issues: None Psychiatric History: Chart notes remote history of depression and suicide attempt. Substance Abuse History: None  Patient / Family Perceptions, Expectations & Goals Pt/Family understanding of illness & functional limitations: Patient admits he has little recall of coming into the hospital.  Has very limited understanding of his medical course but is aware of the need for therapy now.  Wife with a good, basic understanding of his medical course and of current functional limitations/need for CIR. Premorbid pt/family roles/activities: Patient was completely independent PTA.  He is receiving disability and has for several years.  Wife works full-time at a [employer]. Anticipated changes in roles/activities/participation: Wife aware that team anticipates need for 24/7 supervision at least initially and she is prepared about this. Pt/family expectations/goals: "I just want to get home."  Per patient.  Wife hopeful that trach will be removed soon and that this is not something he will discharge home with.  Community Resources [employer]s: None Premorbid Home Care/DME Agencies: None Transportation available at discharge: Yes Resource referrals recommended: Neuropsychology  Discharge Planning Living Arrangements: Spouse/significant other Support Systems: Spouse/significant other, Other relatives Type of Residence: Private residence Insurance Resources: Multimedia programmer (specify), Medicare([place]) Financial Resources: [employer] Screen Referred: No Living Expenses: Medical laboratory scientific officer Management: Spouse Does the patient have any problems obtaining your  medications?: No Home Management: Patient and wife share home responsibilities. Patient/Family Preliminary Plans: Patient to return home with wife who notes she is currently not working due to COVID-19 closure of mill. Social Work Anticipated Follow Up Needs: HH/OP Expected length of stay: 2 weeks  Clinical Impression Pleasant gentleman able to answer basic questions of assessment interview, however, with some confusion with answers at times.  Here for debility following respiratory failure and still has a trach.  Wife very supportive and commits to providing 24/7 support when he returns home.  She is hopeful the trach will be removed prior to his discharge.  Patient denies any significant emotional distress currently.  We will monitor and referral for neuropsychology as indicated.  Will follow for support and discharge planning needs.  Lorre Opdahl 06/01/2018, 4:41 PM

## 2018-06-01 NOTE — Progress Notes (Signed)
Occupational Therapy Session Note  Patient Details  Name: Kyle Lowery MRN: [id number] Date of Birth: 1950/02/20  Today's Date: 06/01/2018 OT Individual Time: 0900-1013 OT Individual Time Calculation (min): 73 min    Short Term Goals: Week 1:  OT Short Term Goal 1 (Week 1): Pt will perform LB clothing management after toileting with min guard for balance.  OT Short Term Goal 2 (Week 1): Pt will perform UB clothing management with min A. OT Short Term Goal 3 (Week 1): Pt will perform LB dressing with mod A.   Skilled Therapeutic Interventions/Progress Updates:    1:1. Pt received in recliner with no c/o pain. Pt agreeable to shower this sessio with trach covered with collar. Pt completes all transfers with grab bar or HHA and MIN A. Pt less averse to hands on A today. Pt bathes with CGA using grab bar sit to stand in shower seat. Pt impulsive and unaware of balance deficits requiring VC for safety awareness. Pt completes UB dresign with A and LB dressing with A to advance pants past hips/fasten pants (MOD A overall). Pt completes standing graded pipe tree activity with mod VC for problem solving when self selected wrong pieces to build figure. Exited session with pt seated in recliner, call light in reach and all needs met  Therapy Documentation Precautions:  Precautions Precautions: Fall Precaution Comments: 2m cuffless trach, capped since 4/18 Restrictions Weight Bearing Restrictions: No General:   Vital Signs: Therapy Vitals Pulse Rate: 65 Resp: 18 Patient Position (if appropriate): Sitting Oxygen Therapy SpO2: 96 % O2 Device: Nasal Cannula O2 Flow Rate (L/min): 2 L/min Pain:   ADL:   Vision   Perception    Praxis   Exercises:   Other Treatments:     Therapy/Group: Individual Therapy  STonny Branch4/24/2020, 10:14 AM

## 2018-06-01 NOTE — Progress Notes (Signed)
Speech Language Pathology Daily Session Note  Patient Details  Name: Kyle Lowery MRN: [id number] Date of Birth: [birth year]  Today's Date: 06/01/2018 SLP Individual Time: 3086-5784 SLP Individual Time Calculation (min): 45 min  Short Term Goals: Week 1: SLP Short Term Goal 1 (Week 1): Patient will consume current diet with minimal overt s/s of aspiration with Min A verbal cues for use of swallowing compensatory strategies.  SLP Short Term Goal 2 (Week 1): Patient will consume trials of ice chips/thin liquids with minimal overt s/s of aspiration over 2 sessions ot assess readiness for repeat MBS SLP Short Term Goal 3 (Week 1): Patient will utilize an increased vocal intensity to achieve ~90% intelligibility at the sentence level with Min A verbal cues.  SLP Short Term Goal 4 (Week 1): Patient will orient to time, place and situation with Mod A multimodal cues.  SLP Short Term Goal 5 (Week 1): Patient will demonstrate sustained attention to functional tasks for ~10 minutes with Mod A verbal cues for redirection.  SLP Short Term Goal 6 (Week 1): Patient will demonstrate intellectual awareness of physical and cognitive deficits with Mod A verbal cues.   Skilled Therapeutic Interventions: Skilled treatment session focused on dysphagia and speech intelligibility goals. SLP facilitated session by providing skilled observation with breakfast meal of Dys. 2 textures with nectar-thick liquids. Patient did not demonstrate any overt s/s of aspiration but reported food feeling "stuck." Suspect patient has an esophogeal dysphagia with h/o dilation. MD aware. At end of session, patient able to expectorate a whole breakfast potato wedge and reported some relief. Recommend patient continue current diet. Patient also required Min-Mod A verbal cues for use of an increased vocal intensity to achieve ~70% intelligibility at the sentence level. Patient left upright in recliner with alarm on and all needs within reach.  Continue with current plan of care.      Pain No/Denies Pain   Therapy/Group: Individual Therapy  Bently Morath 06/01/2018, 12:54 PM

## 2018-06-01 NOTE — Progress Notes (Signed)
[place] PHYSICAL MEDICINE & REHABILITATION PROGRESS NOTE   Subjective/Complaints: Having difficulties with swallowing. "food getting caught" when he tries to swallow. Has had two "surgeries" for problem before. Hx of esophageal dilation  ROS: Patient denies fever, rash,  blurred vision, nausea, vomiting, diarrhea, cough, shortness of breath or chest pain, joint or back pain, headache, or mood change.    Objective:   No results found. Recent Labs    05/31/18 0510  WBC 7.9  HGB 8.6*  HCT 27.0*  PLT 298   Recent Labs    05/31/18 0510  NA 142  K 3.4*  CL 107  CO2 25  GLUCOSE 97  BUN 22  CREATININE 1.01  CALCIUM 8.5*    Intake/Output Summary (Last 24 hours) at 06/01/2018 0947 Last data filed at 06/01/2018 0813 Gross per 24 hour  Intake 356 ml  Output 1850 ml  Net -1494 ml     Physical Exam: Vital Signs Blood pressure (!) 151/60, pulse 65, temperature 98.2 F (36.8 C), temperature source Oral, resp. rate 18, height 5\' 8"  (1.727 m), weight 56.4 kg, SpO2 96 %. Constitutional: No distress . Vital signs reviewed. HEENT: EOMI, oral membranes moist Neck: supple, trach in place capped Cardiovascular: RRR without murmur. No JVD    Respiratory: CTA Bilaterally without wheezes or rales. Normal effort    GI: BS +, non-tender, non-distended  Musculoskeletal:     Comments: No edema or tenderness in extremities  Neurological: He is alert.  Oriented to self, month, place Motor: Grossly 4+/5 throughout--  dysphonic Skin: Skin is warm and dry. He is not diaphoretic.  Seborrhea noted on nasal folds and in beard.  no changes Psychiatric: pleasant and more cooperative today     Assessment/Plan: 1. Functional deficits secondary to debility/encephalopathy which require 3+ hours per day of interdisciplinary therapy in a comprehensive inpatient rehab setting.  Physiatrist is providing close team supervision and 24 hour management of active medical problems listed  below.  Physiatrist and rehab team continue to assess barriers to discharge/monitor patient progress toward functional and medical goals  Care Tool:  Bathing  Bathing activity did not occur: Refused           Bathing assist       Upper Body Dressing/Undressing Upper body dressing   What is the patient wearing?: Pull over shirt    Upper body assist Assist Level: Maximal Assistance - Patient 25 - 49%    Lower Body Dressing/Undressing Lower body dressing      What is the patient wearing?: Pants     Lower body assist Assist for lower body dressing: Maximal Assistance - Patient 25 - 49%     Toileting Toileting Toileting Activity did not occur Landscape architect and hygiene only): Refused  Toileting assist Assist for toileting: Minimal Assistance - Patient > 75%     Transfers Chair/bed transfer  Transfers assist  Chair/bed transfer activity did not occur: Safety/medical concerns  Chair/bed transfer assist level: Minimal Assistance - Patient > 75%     Locomotion Ambulation   Ambulation assist      Assist level: Minimal Assistance - Patient > 75% Assistive device: No Device Max distance: 150   Walk 10 feet activity   Assist     Assist level: Minimal Assistance - Patient > 75%     Walk 50 feet activity   Assist    Assist level: Minimal Assistance - Patient > 75%      Walk 150 feet activity   Assist  Assist level: Minimal Assistance - Patient > 75%      Walk 10 feet on uneven surface  activity   Assist Walk 10 feet on uneven surfaces activity did not occur: Refused         Wheelchair     Assist Will patient use wheelchair at discharge?: No             Wheelchair 50 feet with 2 turns activity    Assist            Wheelchair 150 feet activity     Assist          Medical Problem List and Plan: 1.  Mobility deficits, limitations in endurance, limitation self-care, dysphagia secondary to  debility.             -Continue CIR therapies including PT, OT, and SLP  2.  Antithrombotics: -DVT/anticoagulation:  Pharmaceutical: Other (comment)--Eliquis             -antiplatelet therapy: N/A 3. Pain Management: Oxycodone prn 4. Mood: LCSW to follow for evaluation and support.              -antipsychotic agents: Klonopin, and Seroquel.  Plan to wean medications as tolerated but not today 5. Neuropsych: This patient is not fully capable of making decisions on his own behalf. 6. Skin/Wound Care: Routine pressure relief measures. Continue prostat.  7. Fluids/Electrolytes/Nutrition: encourage PO   -protein supp for low albumin  -replete K+ 8. COPD/hx of lung nodules: Continue duonebs prn. Was on symbicort in the past (2 years ago?).  Monitor respiratory rate with increased exertion.  -plugging trial trach 9. HTN: metoprolol 50mg  q8 hours 10. Delirium: See #4. 11.  Anemia : Continue iron supplement.   -hgb 8.6  -no clinical signs of bleeding at present  -recheck Monday  12. Dysphagia: On dysphagia 3, nectars.   -encourage fluids  -esophagram on 03/28/16 revealed spasm, has hx of GERD  -add protonix 93meq bid  -trial of imdur 15mg  daily. bp should tolerate  LOS: 2 days A FACE TO FACE EVALUATION WAS PERFORMED  Meredith Staggers 06/01/2018, 9:47 AM

## 2018-06-01 NOTE — Progress Notes (Signed)
Discussed trach plans with Dr. Naaman Plummer.  He plans to decannulate Monday.  Also says it is ok not to have him on continuous pulse ox at this time and we can just spot check him.  Will make primary RN aware.  Brita Romp, RN

## 2018-06-02 ENCOUNTER — Inpatient Hospital Stay (HOSPITAL_COMMUNITY): Payer: BLUE CROSS/BLUE SHIELD | Admitting: Speech Pathology

## 2018-06-02 ENCOUNTER — Inpatient Hospital Stay (HOSPITAL_COMMUNITY): Payer: BLUE CROSS/BLUE SHIELD | Admitting: Physical Therapy

## 2018-06-02 ENCOUNTER — Inpatient Hospital Stay (HOSPITAL_COMMUNITY): Payer: BLUE CROSS/BLUE SHIELD

## 2018-06-02 DIAGNOSIS — Z930 Tracheostomy status: Secondary | ICD-10-CM

## 2018-06-02 NOTE — Progress Notes (Signed)
Speech Language Pathology Daily Session Note  Patient Details  Name: ROLLAN ROGER MRN: [id number] Date of Birth: 03/04/1950  Today's Date: 06/02/2018 SLP Individual Time: 1030-1100 SLP Individual Time Calculation (min): 30 min  Short Term Goals: Week 1: SLP Short Term Goal 1 (Week 1): Patient will consume current diet with minimal overt s/s of aspiration with Min A verbal cues for use of swallowing compensatory strategies.  SLP Short Term Goal 2 (Week 1): Patient will consume trials of ice chips/thin liquids with minimal overt s/s of aspiration over 2 sessions ot assess readiness for repeat MBS SLP Short Term Goal 3 (Week 1): Patient will utilize an increased vocal intensity to achieve ~90% intelligibility at the sentence level with Min A verbal cues.  SLP Short Term Goal 4 (Week 1): Patient will orient to time, place and situation with Mod A multimodal cues.  SLP Short Term Goal 5 (Week 1): Patient will demonstrate sustained attention to functional tasks for ~10 minutes with Mod A verbal cues for redirection.  SLP Short Term Goal 6 (Week 1): Patient will demonstrate intellectual awareness of physical and cognitive deficits with Mod A verbal cues.   Skilled Therapeutic Interventions: Skilled treatment session focused on dysphagia and cognitive goals. SLP facilitated session by providing supervision verbal cues for thoroughness with oral care via the suction toothbrush. Patient consumed trials of ice chips with patient reports of liquids "getting hung" which required throat clearing and patient hocking to clear. Recommend continued trials with SLP only. Patient demonstrated appropriate emergent awareness of current deficits but required Mod A verbal cues for anticipatory awareness, especially in regards to utilizing a pill box at home to manage new medications they he would take multiple times a day. Recommend patient practice organization of one prior to going home. Patient left upright in bed  with all needs within reach and alarm on. Continue with current plan of care.      Pain Pain Assessment Pain Scale: 0-10 Pain Score: Asleep Pain Type: Acute pain Pain Location: Head Pain Orientation: Anterior Pain Descriptors / Indicators: Aching Pain Frequency: Intermittent Pain Onset: Gradual Patients Stated Pain Goal: 0 Pain Intervention(s): Medication (See eMAR)  Therapy/Group: Individual Therapy  Brand Siever 06/02/2018, 12:42 PM

## 2018-06-02 NOTE — Progress Notes (Signed)
[facility] PHYSICAL MEDICINE & REHABILITATION PROGRESS NOTE   Subjective/Complaints:  No issues overnite, aware that he has therapy at 10:30am  ROS: Patient denies CP, SOB, N/V/D  Objective:   No results found. Recent Labs    05/31/18 0510  WBC 7.9  HGB 8.6*  HCT 27.0*  PLT 298   Recent Labs    05/31/18 0510  NA 142  K 3.4*  CL 107  CO2 25  GLUCOSE 97  BUN 22  CREATININE 1.01  CALCIUM 8.5*    Intake/Output Summary (Last 24 hours) at 06/02/2018 0934 Last data filed at 06/02/2018 0810 Gross per 24 hour  Intake 1096 ml  Output 2150 ml  Net -1054 ml     Physical Exam: Vital Signs Blood pressure 138/66, pulse 74, temperature 98.7 F (37.1 C), temperature source Oral, resp. rate 16, height 5\' 8"  (1.727 m), weight 56.5 kg, SpO2 98 %. Constitutional: No distress . Vital signs reviewed. HEENT: EOMI, oral membranes moist Neck: supple, trach in place capped Cardiovascular: RRR without murmur. No JVD    Respiratory: CTA Bilaterally without wheezes or rales. Normal effort    GI: BS +, non-tender, non-distended  Musculoskeletal:     Comments: No edema or tenderness in extremities  Neurological: He is alert.  Oriented to self, month, place Motor: Grossly 4+/5 throughout--  dysphonic Skin: Skin is warm and dry. He is not diaphoretic.  Seborrhea noted on nasal folds and in beard.  no changes Psychiatric: pleasant and more cooperative today     Assessment/Plan: 1. Functional deficits secondary to debility/encephalopathy which require 3+ hours per day of interdisciplinary therapy in a comprehensive inpatient rehab setting.  Physiatrist is providing close team supervision and 24 hour management of active medical problems listed below.  Physiatrist and rehab team continue to assess barriers to discharge/monitor patient progress toward functional and medical goals  Care Tool:  Bathing  Bathing activity did not occur: Refused           Bathing assist        Upper Body Dressing/Undressing Upper body dressing   What is the patient wearing?: Pull over shirt    Upper body assist Assist Level: Moderate Assistance - Patient 50 - 74%    Lower Body Dressing/Undressing Lower body dressing      What is the patient wearing?: Pants     Lower body assist Assist for lower body dressing: Moderate Assistance - Patient 50 - 74%     Toileting Toileting Toileting Activity did not occur Landscape architect and hygiene only): Refused  Toileting assist Assist for toileting: Contact Guard/Touching assist     Transfers Chair/bed transfer  Transfers assist  Chair/bed transfer activity did not occur: Safety/medical concerns  Chair/bed transfer assist level: Minimal Assistance - Patient > 75%     Locomotion Ambulation   Ambulation assist      Assist level: Minimal Assistance - Patient > 75% Assistive device: No Device Max distance: 100 ft   Walk 10 feet activity   Assist     Assist level: Minimal Assistance - Patient > 75% Assistive device: No Device   Walk 50 feet activity   Assist    Assist level: Minimal Assistance - Patient > 75% Assistive device: No Device    Walk 150 feet activity   Assist    Assist level: Minimal Assistance - Patient > 75%      Walk 10 feet on uneven surface  activity   Assist Walk 10 feet on uneven surfaces activity  did not occur: Sales promotion account executive Will patient use wheelchair at discharge?: No             Wheelchair 50 feet with 2 turns activity    Assist            Wheelchair 150 feet activity     Assist          Medical Problem List and Plan: 1.  Mobility deficits, limitations in endurance, limitation self-care, dysphagia secondary to debility.             -Continue CIR therapies including PT, OT, and SLP  2.  Antithrombotics: -DVT/anticoagulation:  Pharmaceutical: Other (comment)--Eliquis             -antiplatelet therapy:  N/A 3. Pain Management: Oxycodone prn 4. Mood: LCSW to follow for evaluation and support.              -antipsychotic agents: Klonopin, and Seroquel.  Plan to wean medications as tolerated but not today 5. Neuropsych: This patient is not fully capable of making decisions on his own behalf. 6. Skin/Wound Care: Routine pressure relief measures. Continue prostat.  7. Fluids/Electrolytes/Nutrition: encourage PO   -protein supp for low albumin  -replete K+ 8. COPD/hx of lung nodules: Continue duonebs prn. Was on symbicort in the past (2 years ago?).  Monitor respiratory rate with increased exertion.  -plugging trial trach 9. HTN: metoprolol 50mg  q8 hours Vitals:   06/02/18 0811 06/02/18 0812  BP:    Pulse:    Resp:    Temp:    SpO2: 98% 98%   10. Delirium: improving aware of next scheduled therapy, aware of hsoptial but not [place] 11.  Anemia : Continue iron supplement.   -hgb 8.6  -no clinical signs of bleeding at present  -recheck Monday  12. Dysphagia: On dysphagia 3, nectars.   -encourage fluids  -esophagram on 03/28/16 revealed spasm, has hx of GERD  -add protonix 25meq bid  -trial of imdur 15mg  daily. bp should tolerate 13.  Hypokalemia KCL 39meq dailyLOS: 3 days A FACE TO FACE EVALUATION WAS PERFORMED  Charlett Blake 06/02/2018, 9:34 AM

## 2018-06-02 NOTE — Progress Notes (Signed)
Physical Therapy Session Note  Patient Details  Name: Kyle Lowery MRN: [id number] Date of Birth: 1950-04-28  Today's Date: 06/02/2018 PT Individual Time: 1300-1427 PT Individual Time Calculation (min): 87 min   Short Term Goals: Week 1:  PT Short Term Goal 1 (Week 1): pt will demonstrate sustained attention to therapeutic task x 5 minutes with min A PT Short Term Goal 2 (Week 1): pt will perform gait in controlled environment x 150' with close supervision  Skilled Therapeutic Interventions/Progress Updates:    Patient received in bed, pleasant and willing to participate in session today. Able to complete bed mobility with S, and functional transfers and gait without assistive device with MinA for balance and VC for safety throughout session today. Able to gait train multiple distances of 154f without AD and with MinA. Spent some time working on stretching of B heel cords, B hamstrings and quads, lumbar musculature, and B piriformis due to patient complaints of muscle soreness and B hip pain/stiffness. Worked on cognitive rehab/balance activity involving building objects with PVC pipes with Mod VC for correct placement and problem solving with more advanced puzzles, activity limited by low back pain. Tolerated riding Nustep 10 minutes on level 4 with B LEs/UEs, then gait trained back to room and toileted with close S. VSS and SpO2 97-99% on room air throughout session. He was left in bed with all needs met, RN present and attending this afternoon.   Therapy Documentation Precautions:  Precautions Precautions: Fall Precaution Comments: 648mcuffless trach, capped since 4/18 Restrictions Weight Bearing Restrictions: No Pain: Pain Assessment Pain Scale: 0-10 Pain Score: 0-No pain    Therapy/Group: Individual Therapy   KrDeniece ReeT, DPT, CBIS  Supplemental Physical Therapist [facility]  Pager [phone] [phone] 06/02/2018, 3:45 PM

## 2018-06-02 NOTE — Progress Notes (Signed)
RT NOTE: Patient currently in therapy per RN. RT asked RN to let RT know when patient returns. RT will continue to monitor.

## 2018-06-02 NOTE — Progress Notes (Signed)
Occupational Therapy Session Note  Patient Details  Name: Kyle Lowery MRN: [id number] Date of Birth: 1950-03-27  Today's Date: 06/02/2018 OT Individual Time: 1445-1600 OT Individual Time Calculation (min): 75 min    Short Term Goals: Week 1:  OT Short Term Goal 1 (Week 1): Pt will perform LB clothing management after toileting with min guard for balance.  OT Short Term Goal 2 (Week 1): Pt will perform UB clothing management with min A. OT Short Term Goal 3 (Week 1): Pt will perform LB dressing with mod A.   Skilled Therapeutic Interventions/Progress Updates:    1;1. Pt complain of HA 6/10 and RN delivers medicaiton. Pt agreeable to shower. Pt ambulates into bathroom with MIN A and VC for sitting to doff clothing (which pt ignores) and nearly misses the shower chair to sit despite cues to back up. Pt bathes with supervision and VC for using grab bar to steady self d/t posterior bias. Pt completes dressing with S seated for UB dressing and min A sit to stand for LB dressing. Pt completes standing grooming at sink. Pt demo poor safety awareness throughout attempting to stand when w/c is not locked or leg rests are still in the way d/t impulsivitiy. Pt stands to complete matching nuts, bolts and washers for ~10 min with S for static standing tolerance. Pt completes card matching activity standing for dynamic reaching training with CGA when stooping ot pick up dropped cards off the floor. All activities completed on RA and O2 <97% throughout. Exited session with pt seated in w/c, call lightin reach and all needs met.   Therapy Documentation Precautions:  Precautions Precautions: Fall Precaution Comments: 62m cuffless trach, capped since 4/18 Restrictions Weight Bearing Restrictions: No General:   Vital Signs: Therapy Vitals Pulse Rate: 76 Resp: 17 BP: 128/62 Patient Position (if appropriate): Lying Oxygen Therapy SpO2: 98 % O2 Device: Room Air Pain: Pain Assessment Pain Scale:  0-10 Pain Score: 6  Pain Type: Acute pain Pain Location: Back Pain Orientation: Lower Pain Descriptors / Indicators: Aching Pain Frequency: Intermittent Pain Onset: Gradual Patients Stated Pain Goal: 0 Pain Intervention(s): Medication (See eMAR) ADL:   Vision   Perception    Praxis   Exercises:   Other Treatments:     Therapy/Group: Individual Therapy  STonny Branch4/25/2020, 4:06 PM

## 2018-06-03 NOTE — Progress Notes (Signed)
PHYSICAL MEDICINE & REHABILITATION PROGRESS NOTE   Subjective/Complaints:  No problems overnite.  No RN concerns  ROS: Patient denies CP, SOB, N/V/D  Objective:   No results found. No results for input(s): WBC, HGB, HCT, PLT in the last 72 hours. No results for input(s): NA, K, CL, CO2, GLUCOSE, BUN, CREATININE, CALCIUM in the last 72 hours.  Intake/Output Summary (Last 24 hours) at 06/03/2018 0912 Last data filed at 06/03/2018 0744 Gross per 24 hour  Intake 360 ml  Output 1500 ml  Net -1140 ml     Physical Exam: Vital Signs Blood pressure 140/61, pulse 79, temperature 98.2 F (36.8 C), temperature source Oral, resp. rate 16, height 5\' 8"  (1.727 m), weight 57.2 kg, SpO2 97 %. Constitutional: No distress . Vital signs reviewed. HEENT: EOMI, oral membranes moist Neck: supple, trach in place capped Cardiovascular: RRR without murmur. No JVD    Respiratory: CTA Bilaterally without wheezes or rales. Normal effort    GI: BS +, non-tender, non-distended  Musculoskeletal:     Comments: No edema or tenderness in extremities  Neurological: He is alert.  Oriented to self, month, place Motor: Grossly 4+/5 throughout--  dysphonic Skin: Skin is warm and dry. He is not diaphoretic.  Seborrhea noted on nasal folds and in beard.  no changes Psychiatric: pleasant and more cooperative today     Assessment/Plan: 1. Functional deficits secondary to debility/encephalopathy which require 3+ hours per day of interdisciplinary therapy in a comprehensive inpatient rehab setting.  Physiatrist is providing close team supervision and 24 hour management of active medical problems listed below.  Physiatrist and rehab team continue to assess barriers to discharge/monitor patient progress toward functional and medical goals  Care Tool:  Bathing  Bathing activity did not occur: Refused Body parts bathed by patient: Right arm, Left arm, Chest, Abdomen, Front perineal area, Buttocks,  Right upper leg, Left upper leg, Right lower leg, Left lower leg, Face         Bathing assist Assist Level: Supervision/Verbal cueing     Upper Body Dressing/Undressing Upper body dressing   What is the patient wearing?: Pull over shirt(sweater)    Upper body assist Assist Level: Minimal Assistance - Patient > 75%    Lower Body Dressing/Undressing Lower body dressing      What is the patient wearing?: Hospital gown only, Incontinence brief     Lower body assist Assist for lower body dressing: Minimal Assistance - Patient > 75%     Toileting Toileting Toileting Activity did not occur Landscape architect and hygiene only): Refused  Toileting assist Assist for toileting: Contact Guard/Touching assist     Transfers Chair/bed transfer  Transfers assist  Chair/bed transfer activity did not occur: Safety/medical concerns  Chair/bed transfer assist level: Minimal Assistance - Patient > 75%     Locomotion Ambulation   Ambulation assist      Assist level: Minimal Assistance - Patient > 75% Assistive device: No Device Max distance: 150ft    Walk 10 feet activity   Assist     Assist level: Minimal Assistance - Patient > 75% Assistive device: No Device   Walk 50 feet activity   Assist    Assist level: Minimal Assistance - Patient > 75% Assistive device: No Device    Walk 150 feet activity   Assist    Assist level: Minimal Assistance - Patient > 75% Assistive device: No Device    Walk 10 feet on uneven surface  activity   Assist Walk 10  feet on uneven surfaces activity did not occur: Sales promotion account executive Will patient use wheelchair at discharge?: No             Wheelchair 50 feet with 2 turns activity    Assist            Wheelchair 150 feet activity     Assist          Medical Problem List and Plan: 1.  Mobility deficits, limitations in endurance, limitation self-care, dysphagia secondary  to debility.             -Continue CIR therapies including PT, OT, and SLP  2.  Antithrombotics: -DVT/anticoagulation:  Pharmaceutical: Other (comment)--Eliquis             -antiplatelet therapy: N/A 3. Pain Management: Oxycodone prn 4. Mood: LCSW to follow for evaluation and support.              -antipsychotic agents: Klonopin, and Seroquel.  Plan to wean medications as tolerated but not today 5. Neuropsych: This patient is not fully capable of making decisions on his own behalf. 6. Skin/Wound Care: Routine pressure relief measures. Continue prostat.  7. Fluids/Electrolytes/Nutrition: encourage PO   -protein supp for low albumin  -replete K+, repeat BMET 4/27 8. COPD/hx of lung nodules: Continue duonebs prn. Was on symbicort in the past (2 years ago?).  Monitor respiratory rate with increased exertion.  -plugging trial trach 9. HTN: metoprolol 50mg  q8 hours Vitals:   06/03/18 0530 06/03/18 0830  BP: 140/61   Pulse: 79   Resp: 16   Temp: 98.2 F (36.8 C)   SpO2: 96% 97%  controlled 4/26 10. Delirium: 11.  Anemia : Continue iron supplement.   -hgb 8.6  -no clinical signs of bleeding at present  -recheck Monday  12. Dysphagia: On dysphagia 3, nectars.   -encourage fluids, I 67ml on 4/26  -esophagram on 03/28/16 revealed spasm, has hx of GERD  -add protonix 70meq bid  -trial of imdur 15mg  daily. bp should tolerate 13.  Hypokalemia KCL 56meq dailyLOS: 4 days A FACE TO FACE EVALUATION WAS PERFORMED  Charlett Blake 06/03/2018, 9:12 AM

## 2018-06-04 ENCOUNTER — Inpatient Hospital Stay (HOSPITAL_COMMUNITY): Payer: BLUE CROSS/BLUE SHIELD | Admitting: Speech Pathology

## 2018-06-04 ENCOUNTER — Inpatient Hospital Stay (HOSPITAL_COMMUNITY): Payer: BLUE CROSS/BLUE SHIELD | Admitting: Physical Therapy

## 2018-06-04 ENCOUNTER — Inpatient Hospital Stay (HOSPITAL_COMMUNITY): Payer: BLUE CROSS/BLUE SHIELD | Admitting: Occupational Therapy

## 2018-06-04 ENCOUNTER — Inpatient Hospital Stay (HOSPITAL_COMMUNITY): Payer: BLUE CROSS/BLUE SHIELD

## 2018-06-04 LAB — BASIC METABOLIC PANEL
Anion gap: 5 (ref 5–15)
BUN: 20 mg/dL (ref 8–23)
CO2: 26 mmol/L (ref 22–32)
Calcium: 8.5 mg/dL — ABNORMAL LOW (ref 8.9–10.3)
Chloride: 107 mmol/L (ref 98–111)
Creatinine, Ser: 0.98 mg/dL (ref 0.61–1.24)
GFR calc Af Amer: >60 mL/min (ref >60)
GFR calc non Af Amer: >60 mL/min (ref >60)
Glucose, Bld: 93 mg/dL (ref 70–99)
Potassium: 4.3 mmol/L (ref 3.5–5.1)
Sodium: 138 mmol/L (ref 135–145)

## 2018-06-04 LAB — CBC
HCT: 26.2 % — ABNORMAL LOW (ref 39.0–52.0)
Hemoglobin: 8.3 g/dL — ABNORMAL LOW (ref 13.0–17.0)
MCH: 32.2 pg (ref 26.0–34.0)
MCHC: 31.7 g/dL (ref 30.0–36.0)
MCV: 101.6 fL — ABNORMAL HIGH (ref 80.0–100.0)
Platelets: 253 10*3/uL (ref 150–400)
RBC: 2.58 MIL/uL — ABNORMAL LOW (ref 4.22–5.81)
RDW: 16.4 % — ABNORMAL HIGH (ref 11.5–15.5)
WBC: 6.8 10*3/uL (ref 4.0–10.5)
nRBC: 0 % (ref 0.0–0.2)

## 2018-06-04 NOTE — Progress Notes (Signed)
Bladder Scan > than 350 cc, Coude Cath preformed and obtained 500 cc,tolerated well using lidocaine gel prior to insertion.

## 2018-06-04 NOTE — Progress Notes (Signed)
Speech Language Pathology Daily Session Note  Patient Details  Name: Kyle Lowery MRN: [id number] Date of Birth: [birth year]  Today's Date: 06/04/2018 SLP Individual Time: 0950-1030 SLP Individual Time Calculation (min): 40 min  Short Term Goals: Week 1: SLP Short Term Goal 1 (Week 1): Patient will consume current diet with minimal overt s/s of aspiration with Min A verbal cues for use of swallowing compensatory strategies.  SLP Short Term Goal 2 (Week 1): Patient will consume trials of ice chips/thin liquids with minimal overt s/s of aspiration over 2 sessions ot assess readiness for repeat MBS SLP Short Term Goal 3 (Week 1): Patient will utilize an increased vocal intensity to achieve ~90% intelligibility at the sentence level with Min A verbal cues.  SLP Short Term Goal 4 (Week 1): Patient will orient to time, place and situation with Mod A multimodal cues.  SLP Short Term Goal 5 (Week 1): Patient will demonstrate sustained attention to functional tasks for ~10 minutes with Mod A verbal cues for redirection.  SLP Short Term Goal 6 (Week 1): Patient will demonstrate intellectual awareness of physical and cognitive deficits with Mod A verbal cues.   Skilled Therapeutic Interventions: Pt seen for skilled ST intervention targeting goals for swallowing and cognition. Pt had been decannulated earlier this morning. SLP facilitated session by providing pt with supplies for oral care, which he completed independently after set up. Following oral care, pt was given individual ice chips. Pt took his time with each ice chip. No change in voice quality noted, however, pt did exhibit reflexive cough intermittently. Pt asked about the escaping air at his trach site. SLP provided education regarding applying slight pressure to the dressing to increase cough effectiveness. Voice quality noted to be clear without pressure to stoma. Low intensity continues, but only min cues required for repetition. Pt was  accurate for basics regarding place [facility] - cues required for "rehab"), and situation ("I've been sick is all I know"). Accurate for day, date, month and year. Pt was able to attend to discussion with SLP regarding focus of his therapies while here in rehab. Pt verbalized "walking" and "playing with balls for some reason". SLP provided education regarding why his therapists were using different tasks to meet functional goals, to increase awareness of deficits and their functional impact. These basic goals were written down for pt and posted in his room. Pt was left in bed with alarm set, all needs within reach. Continue ST per current plan of care.   Pain Pain Assessment Pain Scale: 0-10 Pain Score: 6  Pain Type: Chronic pain Pain Location: Back Pain Orientation: Lower Pain Descriptors / Indicators: Aching Pain Frequency: Intermittent Pain Onset: On-going Pain Intervention(s): Repositioned  Therapy/Group: Individual Therapy   Celia B. Quentin Ore, [facility], CCC-SLP Speech Language Pathologist  Shonna Chock 06/04/2018, 12:14 PM

## 2018-06-04 NOTE — Progress Notes (Addendum)
Physical Therapy Session Note  Patient Details  Name: Kyle Lowery MRN: [id number] Date of Birth: 05/28/1950  Today's Date: 06/04/2018 PT Individual Time: 1125-1233 PT Individual Time Calculation (min): 68 min   Short Term Goals: Week 1:  PT Short Term Goal 1 (Week 1): pt will demonstrate sustained attention to therapeutic task x 5 minutes with min A PT Short Term Goal 2 (Week 1): pt will perform gait in controlled environment x 150' with close supervision  Skilled Therapeutic Interventions/Progress Updates:  Pt received asleep in bed but easily awakened & agreeable to tx. Pt transferred to sitting EOB with hospital bed features & supervision, and donned pullover shirt with extra time & min assist. Pt transfers bed>w/c with CGA and is transported to gym for time management. Pt utilizes dynavision progressing from standing on noncompliant to compliant surface without BUE support and CGA progressing to supervision with task focusing on standing balance. Progressed to pt engaging in alternating task of dynavision of pressing only green lights instead of red with pt completing with 100% accuracy. Pt reports need to use restroom and is returned to room. Pt ambulates in room/bathroom without AD & CGA<>light min assist and completes toilet transfer with supervision. Pt unable to void even with extra time. Pt completes hand hygiene standing at sink with CGA. Pt ambulates around unit without AD & CGA except min assist on one occasion 2/2 LOB 2/2 lateral sway; pt with decreased step length BLE, decreased stride length, & decreased reciprocal arm swing and decreased weight shifting L. Pt reports he has about 10 steps to enter his house (? Pt accuracy) with L ascending rail. Pt negotiates 24 steps (6" + 3") with B rails and min assist with step-over-step pattern. Pt utilizes cybex kinetron in standing with BUE support progressing to RUE support with task focusing on dynamic balance, weight shifting L<>R, and BLE  strengthening. Pt reports sudden 6/10 back pain with task so activity ended & rest break provided; RN made aware of pt's c/o & muscle rub applied to painful area 2/2 RN suggestion. Pt does report hx of chronic low back pain. Pt performed sit<>stand without BUE support, completing 6 reps before task ended 2/2 back pain. Pt returned to supine in bed with supervision & HOB elevated. From bed level pt performed BLE straight leg raises for BLE strengthening (therapist provides instructional cuing); pt attempted to perform bridging for glute/hamstring strengthening but activity ended 2/2 increasing back pain. Pt performed back stretches with B hips/knees flexed and rotating core with pt reporting "that feels alright". At end of session pt left in bed with alarm set & all needs at hand.  Pt pleasant throughout session, oriented to location & situation.    Therapy Documentation Precautions:  Precautions Precautions: Fall Precaution Comments: 51mm cuffless trach, capped since 4/18 Restrictions Weight Bearing Restrictions: No    Pain: Pt reports soreness where trach was removed & RN made aware.   Therapy/Group: Individual Therapy  Waunita Schooner 06/04/2018, 12:35 PM

## 2018-06-04 NOTE — Progress Notes (Signed)
Occupational Therapy Session Note  Patient Details  Name: Kyle Lowery MRN: [id number] Date of Birth: June 05, 1950  Today's Date: 06/04/2018 OT Individual Time: 8466-5993 OT Individual Time Calculation (min): 60 min    Short Term Goals: Week 1:  OT Short Term Goal 1 (Week 1): Pt will perform LB clothing management after toileting with min guard for balance.  OT Short Term Goal 2 (Week 1): Pt will perform UB clothing management with min A. OT Short Term Goal 3 (Week 1): Pt will perform LB dressing with mod A.   Skilled Therapeutic Interventions/Progress Updates:    Pt received supine with no c/o pain, RN present administering medication. Pt completed bed mobility to EOB with (S), use of bed features. Pt completed functional mobility without any AD into bathroom with close (S)- occasional CGA with postural sways. Pt completed 3/3 toileting tasks with (S), voiding b/b. Trach occlusion collar donned for shower. Pt transferred into shower with CGA. Pt able to complete all bathing seated with (S). Pt completed functional mobility back to EOB where he dressed. Pt able to don pants and shirt, with CGA provided during standing level clothing management. Pt completed 200 ft of functional mobility with CGA. Pt was left sitting up in w/c with chair alarm belt fastened and RT present to remove trach.   Therapy Documentation Precautions:  Precautions Precautions: Fall Precaution Comments: 47mm cuffless trach, capped since 4/18 Restrictions Weight Bearing Restrictions: No   Therapy/Group: Individual Therapy  Curtis Sites 06/04/2018, 7:19 AM

## 2018-06-04 NOTE — Progress Notes (Signed)
Occupational Therapy Session Note  Patient Details  Name: Kyle Lowery MRN: [id number] Date of Birth: 1950/09/03  Today's Date: 06/04/2018 OT Individual Time: 1411-1449 OT Individual Time Calculation (min): 38 min    Short Term Goals: Week 1:  OT Short Term Goal 1 (Week 1): Pt will perform LB clothing management after toileting with min guard for balance.  OT Short Term Goal 2 (Week 1): Pt will perform UB clothing management with min A. OT Short Term Goal 3 (Week 1): Pt will perform LB dressing with mod A.   Skilled Therapeutic Interventions/Progress Updates:    Upon entering the room, RN present and giving medications. Pt with c/o lower back ache of 3/10 but agreeable to OT intervention. Pt ambulating 150' to ADL apartment without use of AD and min guard. Pt taking seated rest break and demonstrating ability to sit <>stand from recliner chair and sofa, similar to home environment, with steady assistance for balance. Pt verbalized bed being much taller than bed suite in apartment and he utilizes step stool at home. OT demonstrated simulated step over walk in shower transfers with pt returning demonstrations with min guard for balance. Pt verbalized having grab bars as well and could get shower seat if needed for safety. Pt taking seated rest break and returning to room in same manner as above. Pt needing mod verbal guidance cuing to locate room on unit. Pt returning to bed with call bell and all needed items within reach. Bed alarm activated.   Therapy Documentation Precautions:  Precautions Precautions: Fall Precaution Comments: 11mm cuffless trach, capped since 4/18 Restrictions Weight Bearing Restrictions: No General:   Vital Signs: Therapy Vitals Temp: 98 F (36.7 C) Temp Source: Oral Pulse Rate: 77 Resp: 19 BP: 138/63 Patient Position (if appropriate): Lying Oxygen Therapy SpO2: 100 % O2 Device: Room Air Pain: Pain Assessment Pain Scale: 0-10 Pain Score: 7  Pain Type:  Chronic pain Pain Location: Back Pain Orientation: Lower Pain Descriptors / Indicators: Aching;Throbbing Pain Frequency: Constant Pain Onset: On-going Pain Intervention(s): Medication (See eMAR)   Therapy/Group: Individual Therapy  Gypsy Decant 06/04/2018, 2:51 PM

## 2018-06-04 NOTE — Progress Notes (Signed)
[place] PHYSICAL MEDICINE & REHABILITATION PROGRESS NOTE   Subjective/Complaints:  No problems overnite.  No RN concerns  ROS: Patient denies CP, SOB, N/V/D  Objective:   No results found. Recent Labs    06/04/18 0617  WBC 6.8  HGB 8.3*  HCT 26.2*  PLT 253   Recent Labs    06/04/18 0617  NA 138  K 4.3  CL 107  CO2 26  GLUCOSE 93  BUN 20  CREATININE 0.98  CALCIUM 8.5*    Intake/Output Summary (Last 24 hours) at 06/04/2018 0932 Last data filed at 06/04/2018 0615 Gross per 24 hour  Intake 360 ml  Output 1922 ml  Net -1562 ml     Physical Exam: Vital Signs Blood pressure 123/62, pulse 81, temperature 98.2 F (36.8 C), temperature source Oral, resp. rate 15, height 5\' 8"  (1.727 m), weight 58.1 kg, SpO2 94 %. Constitutional: No distress . Vital signs reviewed. HEENT: EOMI, oral membranes moist Neck: supple, trach in place capped Cardiovascular: RRR without murmur. No JVD    Respiratory: CTA Bilaterally without wheezes or rales. Normal effort    GI: BS +, non-tender, non-distended  Musculoskeletal:     Comments: No edema or tenderness in extremities  Neurological: He is alert.  Oriented to self, month, place Motor: Grossly 4+/5 throughout--  dysphonic Skin: Skin is warm and dry. He is not diaphoretic.  Seborrhea noted on nasal folds and in beard.  no changes Psychiatric: pleasant and more cooperative today     Assessment/Plan: 1. Functional deficits secondary to debility/encephalopathy which require 3+ hours per day of interdisciplinary therapy in a comprehensive inpatient rehab setting.  Physiatrist is providing close team supervision and 24 hour management of active medical problems listed below.  Physiatrist and rehab team continue to assess barriers to discharge/monitor patient progress toward functional and medical goals  Care Tool:  Bathing  Bathing activity did not occur: Refused Body parts bathed by patient: Right arm, Left arm, Chest,  Abdomen, Front perineal area, Buttocks, Right upper leg, Left upper leg, Right lower leg, Left lower leg, Face         Bathing assist Assist Level: Supervision/Verbal cueing     Upper Body Dressing/Undressing Upper body dressing   What is the patient wearing?: Pull over shirt(sweater)    Upper body assist Assist Level: Minimal Assistance - Patient > 75%    Lower Body Dressing/Undressing Lower body dressing      What is the patient wearing?: Hospital gown only, Incontinence brief     Lower body assist Assist for lower body dressing: Minimal Assistance - Patient > 75%     Toileting Toileting Toileting Activity did not occur Landscape architect and hygiene only): Refused  Toileting assist Assist for toileting: Contact Guard/Touching assist     Transfers Chair/bed transfer  Transfers assist  Chair/bed transfer activity did not occur: Safety/medical concerns  Chair/bed transfer assist level: Minimal Assistance - Patient > 75%     Locomotion Ambulation   Ambulation assist      Assist level: Minimal Assistance - Patient > 75% Assistive device: No Device Max distance: 150ft    Walk 10 feet activity   Assist     Assist level: Minimal Assistance - Patient > 75% Assistive device: No Device   Walk 50 feet activity   Assist    Assist level: Minimal Assistance - Patient > 75% Assistive device: No Device    Walk 150 feet activity   Assist    Assist level: Minimal Assistance -  Patient > 75% Assistive device: No Device    Walk 10 feet on uneven surface  activity   Assist Walk 10 feet on uneven surfaces activity did not occur: Refused         Wheelchair     Assist Will patient use wheelchair at discharge?: No             Wheelchair 50 feet with 2 turns activity    Assist            Wheelchair 150 feet activity     Assist          Medical Problem List and Plan: 1.  Mobility deficits, limitations in endurance,  limitation self-care, dysphagia secondary to debility.             -Continue CIR therapies including PT, OT, and SLP  2.  Antithrombotics: -DVT/anticoagulation:  Pharmaceutical: Other (comment)--Eliquis             -antiplatelet therapy: N/A 3. Pain Management: Oxycodone prn 4. Mood: LCSW to follow for evaluation and support.              -antipsychotic agents: Klonopin, and Seroquel.  Plan to wean medications as tolerated but not today 5. Neuropsych: This patient is not fully capable of making decisions on his own behalf. 6. Skin/Wound Care: Routine pressure relief measures. Continue prostat.  7. Fluids/Electrolytes/Nutrition: encourage PO   -protein supp for low albumin  -replete K+, repeat BMET 4/27 8. COPD/hx of lung nodules: Continue duonebs prn. Was on symbicort in the past (2 years ago?).  Monitor respiratory rate with increased exertion.  -DECANNULATE TODAY  -OCCLUSIVE DRESSING 9. HTN: metoprolol 50mg  q8 hours Vitals:   06/04/18 0415 06/04/18 0549  BP:  123/62  Pulse: 74 81  Resp: 16 15  Temp:  98.2 F (36.8 C)  SpO2: 94% 94%  controlled 4/27 10. Delirium: 11.  Anemia : Continue iron supplement.   -hgb 8.6  -no clinical signs of bleeding at present  -recheck Monday  12. Dysphagia: On dysphagia 3, nectars.   -eating and drinking well  -esophagram on 03/28/16 revealed spasm, has hx of GERD  -add protonix 82meq bid  -continue imdur 15mg  daily. Seems to have helped   -bp tolerating 13.  Hypokalemia KCL 24meq dailyLOS:   5 days A FACE TO FACE EVALUATION WAS PERFORMED  Meredith Staggers 06/04/2018, 9:32 AM

## 2018-06-04 NOTE — Progress Notes (Signed)
Removed trach per MD order without any complications. Pt is stable on RA. RT will continue to monitor.

## 2018-06-05 ENCOUNTER — Inpatient Hospital Stay (HOSPITAL_COMMUNITY): Payer: BLUE CROSS/BLUE SHIELD

## 2018-06-05 ENCOUNTER — Inpatient Hospital Stay (HOSPITAL_COMMUNITY): Payer: BLUE CROSS/BLUE SHIELD | Admitting: Physical Therapy

## 2018-06-05 ENCOUNTER — Inpatient Hospital Stay (HOSPITAL_COMMUNITY): Payer: BLUE CROSS/BLUE SHIELD | Admitting: Speech Pathology

## 2018-06-05 MED ORDER — ACETAMINOPHEN 325 MG PO TABS: 325.0000 mg | ORAL_TABLET | ORAL | Status: DC | PRN

## 2018-06-05 NOTE — Progress Notes (Signed)
[place] PHYSICAL MEDICINE & REHABILITATION PROGRESS NOTE   Subjective/Complaints:    Happy that trach was out. Was concerned about an episode of incontinence he had. Woke up wet  ROS: Patient denies fever, rash, sore throat, blurred vision, nausea, vomiting, diarrhea, cough, shortness of breath or chest pain, joint or back pain, headache, or mood change.  Objective:   No results found. Recent Labs    06/04/18 0617  WBC 6.8  HGB 8.3*  HCT 26.2*  PLT 253   Recent Labs    06/04/18 0617  NA 138  K 4.3  CL 107  CO2 26  GLUCOSE 93  BUN 20  CREATININE 0.98  CALCIUM 8.5*    Intake/Output Summary (Last 24 hours) at 06/05/2018 1000 Last data filed at 06/05/2018 0700 Gross per 24 hour  Intake 900 ml  Output 2370 ml  Net -1470 ml     Physical Exam: Vital Signs Blood pressure 123/61, pulse 61, temperature 98 F (36.7 C), temperature source Oral, resp. rate 15, height 5\' 8"  (1.727 m), weight 58.2 kg, SpO2 98 %. Constitutional: No distress . Vital signs reviewed. HEENT: EOMI, oral membranes moist Neck: supple, trach stoma dressed/ just sealed for shower Cardiovascular: RRR without murmur. No JVD    Respiratory: CTA Bilaterally without wheezes or rales. Normal effort    GI: BS +, non-tender, non-distended  Musculoskeletal:     Comments: No edema or tenderness in extremities  Neurological: He is alert.  Oriented to self, month, place Motor: Grossly 4+/5 throughout--  dysphonic Skin: Skin is warm and dry. He is not diaphoretic.  Seborrhea noted on nasal folds and in beard--appears stable Psychiatric: pleasant and cooperative    Assessment/Plan: 1. Functional deficits secondary to debility/encephalopathy which require 3+ hours per day of interdisciplinary therapy in a comprehensive inpatient rehab setting.  Physiatrist is providing close team supervision and 24 hour management of active medical problems listed below.  Physiatrist and rehab team continue to assess  barriers to discharge/monitor patient progress toward functional and medical goals  Care Tool:  Bathing  Bathing activity did not occur: Refused Body parts bathed by patient: Right arm, Left arm, Chest, Abdomen, Front perineal area, Buttocks, Right upper leg, Left upper leg, Right lower leg, Left lower leg, Face         Bathing assist Assist Level: Supervision/Verbal cueing     Upper Body Dressing/Undressing Upper body dressing   What is the patient wearing?: Pull over shirt    Upper body assist Assist Level: Supervision/Verbal cueing    Lower Body Dressing/Undressing Lower body dressing      What is the patient wearing?: Hospital gown only, Incontinence brief     Lower body assist Assist for lower body dressing: Supervision/Verbal cueing     Toileting Toileting Toileting Activity did not occur (Clothing management and hygiene only): Refused  Toileting assist Assist for toileting: Supervision/Verbal cueing     Transfers Chair/bed transfer  Transfers assist  Chair/bed transfer activity did not occur: Safety/medical concerns  Chair/bed transfer assist level: Contact Guard/Touching assist     Locomotion Ambulation   Ambulation assist      Assist level: Contact Guard/Touching assist Assistive device: No Device Max distance: 135ft    Walk 10 feet activity   Assist     Assist level: Contact Guard/Touching assist Assistive device: No Device   Walk 50 feet activity   Assist    Assist level: Contact Guard/Touching assist Assistive device: No Device    Walk 150 feet activity  Assist    Assist level: Contact Guard/Touching assist Assistive device: No Device    Walk 10 feet on uneven surface  activity   Assist Walk 10 feet on uneven surfaces activity did not occur: Refused         Wheelchair     Assist Will patient use wheelchair at discharge?: No             Wheelchair 50 feet with 2 turns activity    Assist             Wheelchair 150 feet activity     Assist          Medical Problem List and Plan: 1.  Mobility deficits, limitations in endurance, limitation self-care, dysphagia secondary to debility.             -Continue CIR therapies including PT, OT, and SLP  2.  Antithrombotics: -DVT/anticoagulation:  Pharmaceutical: Other (comment)--Eliquis             -antiplatelet therapy: N/A 3. Pain Management: Oxycodone prn 4. Mood: LCSW to follow for evaluation and support.              -antipsychotic agents: Klonopin, and Seroquel.  Plan to wean medications as tolerated but not today 5. Neuropsych: This patient is not fully capable of making decisions on his own behalf. 6. Skin/Wound Care: Routine pressure relief measures. Continue prostat.  7. Fluids/Electrolytes/Nutrition: encourage PO   -protein supp for low albumin  -replete K+, repeat BMET 4/27 within normal limits--dc supp 8. COPD/hx of lung nodules: Continue duonebs prn. Was on symbicort in the past (2 years ago?).  Monitor respiratory rate with increased exertion.  -decannulated without issue  -OCCLUSIVE DRESSING for now 9. HTN: metoprolol 50mg  q8 hours Vitals:   06/05/18 0600 06/05/18 0807  BP: 123/61   Pulse: 72 61  Resp: 18 15  Temp: 98 F (36.7 C)   SpO2: 99% 98%  controlled 4/28 10. Delirium: improved 11.  Anemia : Continue iron supplement.   -hgb 8.3 4/28  -no clinical signs of bleeding at present  - hgb has ranged from 8.2 to 9.5  12. Dysphagia:  On dysphagia 3, nectars.   -eating and drinking well  -esophagram on 03/28/16 revealed spasm, has hx of GERD  -added protonix 67meq bid  -continue imdur 15mg  daily. Seems to have helped so far. D/w SLP   -bp tolerating    LOS:   6 days A FACE TO FACE EVALUATION WAS PERFORMED  Meredith Staggers 06/05/2018, 10:00 AM

## 2018-06-05 NOTE — Progress Notes (Signed)
Physical Therapy Session Note  Patient Details  Name: Kyle Lowery MRN: [id number] Date of Birth: May 07, 1950  Today's Date: 06/05/2018 PT Individual Time: 1030-1058 PT Individual Time Calculation (min): 28 min   Short Term Goals: Week 2:  PT Short Term Goal 1 (Week 2): STG = LTG due to ELOS.  Skilled Therapeutic Interventions/Progress Updates:    Patient received in bed, pleasant and willing to participate in therapy, enthusiastically telling therapist about his grand kids. Able to complete bed mobility with S, however required min guard for functional transfers and gait approximately 132f with MinA for balance and safety today. Tolerated riding Nustep 10 minutes on resistance 4 with B UEs/LEs, then able to gait train back to his room with Mod cues for focusing on gait as balance deficits increase when patient is distracted by his environment. He declines up to chair and was left in bed with all needs met, bed alarm active and RN present and attending.   Therapy Documentation Precautions:  Precautions Precautions: Fall Precaution Comments: Recently decannulated, stoma occluded Restrictions Weight Bearing Restrictions: No Pain: Pain Assessment Pain Scale: 0-10 Pain Score: 3  Pain Type: Chronic pain Pain Location: Back Pain Orientation: Mid;Lower Pain Descriptors / Indicators: Aching Pain Frequency: Constant Pain Onset: On-going Patients Stated Pain Goal: 0 Pain Intervention(s): Ambulation/increased activity;Emotional support Multiple Pain Sites: No    Therapy/Group: Individual Therapy  KDeniece ReePT, DPT, CBIS  Supplemental Physical Therapist [facility]   Pager [phone] Rehab Office [phone]  06/05/2018, 12:41 PM

## 2018-06-05 NOTE — Plan of Care (Signed)
  Problem: Consults Goal: RH GENERAL PATIENT EDUCATION Description See Patient Education module for education specifics. Outcome: Progressing   Problem: RH SKIN INTEGRITY Goal: RH STG MAINTAIN SKIN INTEGRITY WITH ASSISTANCE Description STG Maintain Skin Integrity With [place].  Outcome: Progressing   Problem: RH SAFETY Goal: RH STG ADHERE TO SAFETY PRECAUTIONS W/ASSISTANCE/DEVICE Description STG Adhere to Safety Precautions With Min Assistance/Device.  Outcome: Progressing   Problem: RH PAIN MANAGEMENT Goal: RH STG PAIN MANAGED AT OR BELOW PT'S PAIN GOAL Outcome: Progressing

## 2018-06-05 NOTE — Progress Notes (Signed)
Speech Language Pathology Daily Session Note  Patient Details  Name: Kyle Lowery MRN: [id number] Date of Birth: [birth year]  Today's Date: 06/05/2018 SLP Individual Time: 0930-1025 SLP Individual Time Calculation (min): 55 min  Short Term Goals: Week 1: SLP Short Term Goal 1 (Week 1): Patient will consume current diet with minimal overt s/s of aspiration with Min A verbal cues for use of swallowing compensatory strategies.  SLP Short Term Goal 2 (Week 1): Patient will consume trials of ice chips/thin liquids with minimal overt s/s of aspiration over 2 sessions ot assess readiness for repeat MBS SLP Short Term Goal 3 (Week 1): Patient will utilize an increased vocal intensity to achieve ~90% intelligibility at the sentence level with Min A verbal cues.  SLP Short Term Goal 4 (Week 1): Patient will orient to time, place and situation with Mod A multimodal cues.  SLP Short Term Goal 5 (Week 1): Patient will demonstrate sustained attention to functional tasks for ~10 minutes with Mod A verbal cues for redirection.  SLP Short Term Goal 6 (Week 1): Patient will demonstrate intellectual awareness of physical and cognitive deficits with Mod A verbal cues.   Skilled Therapeutic Interventions: Skilled treatment session focused on dysphagia and cognitive goals. SLP facilitated session by providing trials of ice chips and thin liquids via cup. Patient demonstrated what appeared to be a timely swallow initiation and only once instance of overt s/s of aspiration. Therefore, recommend repeat MBS tomorrow to assess swallow function. SLP also facilitated session by providing extra time and Min A verbal cues for functional problem solving during a basic money management task. Patient left upright in bed with alarm on and all needs within reach. Continue with current plan of care.      Pain Pain Assessment Pain Scale: 0-10 Pain Score: 3  Pain Type: Chronic pain Pain Location: Back Pain Orientation:  Mid;Lower Pain Descriptors / Indicators: Aching Pain Onset: On-going Patients Stated Pain Goal: 0 Pain Intervention(s): Ambulation/increased activity;Emotional support Multiple Pain Sites: No  Therapy/Group: Individual Therapy  Kyle Lowery 06/05/2018, 3:36 PM

## 2018-06-05 NOTE — Progress Notes (Signed)
Physical Therapy Weekly Progress Note  Patient Details  Name: Kyle Lowery MRN: [id number] Date of Birth: [birth year]  Beginning of progress report period: May 31, 2018 End of progress report period: June 05, 2018  Today's Date: 06/05/2018  Patient has met 1 of 2 short term goals.  Pt is making good progress towards LTG's as he currently is able to ambulate without AD and CGA with decreased weight shifting L and slightly impaired balance. Pt negotiates stairs with B rails and min assist & pt presents with impaired awareness and memory. Pt would benefit from continued skilled PT treatment to focus on balance, endurance, strengthening, awareness/cognitive remediation, gait, stair negotiation, and for pt/family education prior to d/c to ensure safe d/c plan.  Patient continues to demonstrate the following deficits muscle weakness, decreased cardiorespiratoy endurance, decreased coordination, decreased attention, decreased awareness, decreased problem solving, decreased safety awareness, decreased memory and delayed processing, and decreased standing balance, decreased postural control and decreased balance strategies and therefore will continue to benefit from skilled PT intervention to increase functional independence with mobility.  Patient progressing toward long term goals..  Continue plan of care.  PT Short Term Goals Week 1:  PT Short Term Goal 1 (Week 1): pt will demonstrate sustained attention to therapeutic task x 5 minutes with min A PT Short Term Goal 1 - Progress (Week 1): Met PT Short Term Goal 2 (Week 1): pt will perform gait in controlled environment x 150' with close supervision PT Short Term Goal 2 - Progress (Week 1): Progressing toward goal Week 2:  PT Short Term Goal 1 (Week 2): STG = LTG due to ELOS.   Therapy Documentation Precautions:  Precautions Precautions: Fall Precaution Comments: Recently decannulated, stoma occluded Restrictions Weight Bearing  Restrictions: No   Therapy/Group: Individual Therapy  Waunita Schooner 06/05/2018, 11:25 AM

## 2018-06-05 NOTE — Progress Notes (Signed)
Physical Therapy Session Note  Patient Details  Name: Kyle Lowery MRN: [id number] Date of Birth: 05/23/1950  Today's Date: 06/05/2018 PT Individual Time: 4098-1191 PT Individual Time Calculation (min): 55 min   Short Term Goals: Week 2:  PT Short Term Goal 1 (Week 2): STG = LTG due to ELOS.  Skilled Therapeutic Interventions/Progress Updates:  Pt received in bed, just waking up & wishing to eat lunch. Pt transfers sit<>supine with supervision. Pt consumed lunch sitting EOB with distant supervision with therapist providing min cuing for small bites/sips.  Pt completes sit<>stand with supervision and ambulates around unit without AD & CGA with lateral sway, impaired step width LLE, decreased reciprocal arm swing LUE. Pt completes Berg Balance Test & scores 30/56; educated pt on interpretation of score & current fall risk. Patient demonstrates increased fall risk as noted by score of 30/56 on Berg Balance Scale.  (<36= high risk for falls, close to 100%; 37-45 significant >80%; 46-51 moderate >50%; 52-55 lower >25%). Pt returns to room & attempts to void in standing with pt only able to void a few drops. Pt performs hand hygiene standing at sink with CGA. Pt with some urine dripping on paper scrub pants so provided pt with new pair & pt donned them sitting/standing EOB with supervision. Pt performed forward & retrograde gait with min assist without AD with task focusing on dynamic balance and weight shifting. At end of session pt left in bed with alarm set & needs at hand.    Therapy Documentation Precautions:  Precautions Precautions: Fall Precaution Comments: Recently decannulated, stoma occluded Restrictions Weight Bearing Restrictions: No    Pain: Pt with c/o unrated back pain - seated rest break with back support provided.   Balance: Balance Balance Assessed: Yes Standardized Balance Assessment Standardized Balance Assessment: Berg Balance Test Berg Balance Test Sit to Stand:  Able to stand without using hands and stabilize independently Standing Unsupported: Able to stand 2 minutes with supervision Sitting with Back Unsupported but Feet Supported on Floor or Stool: Able to sit safely and securely 2 minutes Stand to Sit: Sits safely with minimal use of hands Transfers: Able to transfer with verbal cueing and /or supervision Standing Unsupported with Eyes Closed: Able to stand 10 seconds with supervision Standing Ubsupported with Feet Together: Able to place feet together independently and stand for 1 minute with supervision From Standing, Reach Forward with Outstretched Arm: Reaches forward but needs supervision From Standing Position, Pick up Object from Floor: Able to pick up shoe, needs supervision From Standing Position, Turn to Look Behind Over each Shoulder: Needs supervision when turning Turn 360 Degrees: Needs close supervision or verbal cueing(completes turns to both directions with supervision) Standing Unsupported, Alternately Place Feet on Step/Stool: Able to complete >2 steps/needs minimal assist Standing Unsupported, One Foot in Front: Loses balance while stepping or standing(pt attempts tandem stance (demonstrates impaired awareness) & only able to maintain <15 seconds) Standing on One Leg: Unable to try or needs assist to prevent fall Total Score: 30     Therapy/Group: Individual Therapy  Waunita Schooner 06/05/2018, 2:31 PM

## 2018-06-05 NOTE — Progress Notes (Signed)
Occupational Therapy Session Note  Patient Details  Name: Kyle Lowery MRN: [id number] Date of Birth: 12-01-1950  Today's Date: 06/05/2018 OT Individual Time: 3709-6438 Session 2: 3818-4037 OT Individual Time Calculation (min): 75 min    Session 2: 35 min   Short Term Goals: Week 1:  OT Short Term Goal 1 (Week 1): Pt will perform LB clothing management after toileting with min guard for balance.  OT Short Term Goal 2 (Week 1): Pt will perform UB clothing management with min A. OT Short Term Goal 3 (Week 1): Pt will perform LB dressing with mod A.   Skilled Therapeutic Interventions/Progress Updates:    Session 1: Pt received supine, easily awoken and ready for breakfast. Pt c/o back pain, chronic in nature and no request for intervention. Pt sat EOB and ate  Breakfast with full supervision. Cueing required for bite size management and pacing. Pt able to regulate bite size, verbalizing "is that too much" frequently following cueing. Pt sat EOB for breathing treatment provided by RT. Pt completed functional mobility into bathroom with min guard without AE. Pt transferred to toilet with (S). Stoma occluded for shower. Pt able to complete all bathing sit <> stand with close (S). Pt transferred out of shower with cueing provided for seated level drying LB. Pt completed functional mobility to EOB and donned UB clothing set up assist. Cueing required to don pants seated, with pt receptive and willing to comply this session. Pt donned pants close (S). Pt sat in front of sink and completed grooming tasks with set up assist. Stoma dressing changed following consultation with RN.  Pt returned to supine and was left with all needs met, bed alarm set.   Session 2: Pt received supine requesting his ice cream on side table. (S) provided during self feeding, with only minimal cueing required. Pt completed functional mobility to therapy gym, 150 ft with CGA, several slight LOB. Pt completed standing BLE  coordination activity to challenge dynamic standing balance. Pt required minimal cueing and had several LOB with min A to correct. Pt returned to room and a coke was thickened to nectar consistency. Pt took several sips and then returned to supine in bed, bed alarm set.    Therapy Documentation Precautions:  Precautions Precautions: Fall Precaution Comments: Recently decannulated, stoma occluded Restrictions Weight Bearing Restrictions: No  Therapy/Group: Individual Therapy  Curtis Sites 06/05/2018, 8:45 AM

## 2018-06-06 ENCOUNTER — Inpatient Hospital Stay (HOSPITAL_COMMUNITY): Payer: BLUE CROSS/BLUE SHIELD

## 2018-06-06 ENCOUNTER — Inpatient Hospital Stay (HOSPITAL_COMMUNITY): Payer: BLUE CROSS/BLUE SHIELD | Admitting: Speech Pathology

## 2018-06-06 ENCOUNTER — Encounter (HOSPITAL_COMMUNITY): Payer: BLUE CROSS/BLUE SHIELD | Admitting: Speech Pathology

## 2018-06-06 ENCOUNTER — Inpatient Hospital Stay (HOSPITAL_COMMUNITY): Payer: BLUE CROSS/BLUE SHIELD | Admitting: Physical Therapy

## 2018-06-06 NOTE — Progress Notes (Signed)
Speech Language Pathology Daily Session Note  Patient Details  Name: Kyle Lowery MRN: [id number] Date of Birth: 03-01-1950  Today's Date: 06/06/2018 SLP Individual Time: 1015-1040 SLP Individual Time Calculation (min): 25 min  Short Term Goals: Week 1: SLP Short Term Goal 1 (Week 1): Patient will consume current diet with minimal overt s/s of aspiration with Min A verbal cues for use of swallowing compensatory strategies.  SLP Short Term Goal 2 (Week 1): Patient will consume trials of ice chips/thin liquids with minimal overt s/s of aspiration over 2 sessions ot assess readiness for repeat MBS SLP Short Term Goal 2 - Progress (Week 1): Met SLP Short Term Goal 3 (Week 1): Patient will utilize an increased vocal intensity to achieve ~90% intelligibility at the sentence level with Min A verbal cues.  SLP Short Term Goal 4 (Week 1): Patient will orient to time, place and situation with Mod A multimodal cues.  SLP Short Term Goal 5 (Week 1): Patient will demonstrate sustained attention to functional tasks for ~10 minutes with Mod A verbal cues for redirection.  SLP Short Term Goal 6 (Week 1): Patient will demonstrate intellectual awareness of physical and cognitive deficits with Mod A verbal cues.   Skilled Therapeutic Interventions: Skilled treatment session focused on dysphagia goals. Patient independently recalled results of MBS and recommended strategies to utilize to maximize safety with PO intake. Patient consumed a snack of Dys. 2 textures with thin liquids via straw without overt s/s of aspiration and was overall Mod I for use of swallowing compensatory strategies. Recommend patient continue current diet of Dys. 2 textures with thin liquids with full supervision. Patient verbalized understanding. Patient left upright in bed with alarm on and all needs within reach. Continue with current plan of care.      Pain Pain Assessment Pain Scale: 0-10 Pain Score: 0-No pain  Therapy/Group:  Individual Therapy  Abdoul Encinas 06/06/2018, 10:41 AM

## 2018-06-06 NOTE — Progress Notes (Signed)
Physical Therapy Session Note  Patient Details  Name: Kyle Lowery MRN: [id number] Date of Birth: 1950/09/14  Today's Date: 06/06/2018 PT Individual Time: 1400-1442 PT Individual Time Calculation (min): 42 min   Short Term Goals: Week 2:  PT Short Term Goal 1 (Week 2): STG = LTG due to ELOS.  Skilled Therapeutic Interventions/Progress Updates:    Patient received in bed, very flat affect today but willing to participate in PT. Able to complete bed mobility with S but requires S-min guard for functional transfers and gait approximately 182f with no device due to on-going LOB especially when distracted. Gait trained to PT gym and worked on functional stretches for lumbar spine and hamstrings to reduce patient reported pain, then walked to dynavision and worked on balance strategies in narrow BOS and tandem stance on stable surface with cross-midline reaches and Min-ModA for balance and safety. Then ambulated back to his room with MinA for balance, able to toilet and perform pericare with general S. He was left in bed with all needs met, bed alarm active this afternoon.   Therapy Documentation Precautions:  Precautions Precautions: Fall Precaution Comments: Recently decannulated, stoma occluded Restrictions Weight Bearing Restrictions: No Pain: Pain Assessment Pain Scale: 0-10 Pain Score: 3  Pain Type: Chronic pain Pain Location: Back Pain Orientation: Lower;Mid Pain Descriptors / Indicators: Aching Pain Onset: On-going Patients Stated Pain Goal: 0 Pain Intervention(s): Repositioned;Ambulation/increased activity Multiple Pain Sites: No    Therapy/Group: Individual Therapy   KDeniece ReePT, DPT, CBIS  Supplemental Physical Therapist [facility]   Pager [phone] 3130Acute Rehab Office [phone]   06/06/2018, 3:59 PM

## 2018-06-06 NOTE — Progress Notes (Signed)
[place] PHYSICAL MEDICINE & REHABILITATION PROGRESS NOTE   Subjective/Complaints:    No new issues. Up on toilet. Denies pain  ROS: Limited due to  behavioral  Objective:   No results found. Recent Labs    06/04/18 0617  WBC 6.8  HGB 8.3*  HCT 26.2*  PLT 253   Recent Labs    06/04/18 0617  NA 138  K 4.3  CL 107  CO2 26  GLUCOSE 93  BUN 20  CREATININE 0.98  CALCIUM 8.5*    Intake/Output Summary (Last 24 hours) at 06/06/2018 0959 Last data filed at 06/06/2018 0900 Gross per 24 hour  Intake 480 ml  Output 983 ml  Net -503 ml     Physical Exam: Vital Signs Blood pressure 136/65, pulse 84, temperature 98.2 F (36.8 C), temperature source Oral, resp. rate 18, height 5\' 8"  (1.727 m), weight 58.1 kg, SpO2 94 %. Constitutional: No distress . Vital signs reviewed. HEENT: EOMI, oral membranes moist Neck: supple, trach stoma dressed. Cardiovascular: RRR without murmur. No JVD    Respiratory: CTA Bilaterally without wheezes or rales. Normal effort    GI: BS +, non-tender, non-distended  Musculoskeletal:     Comments: No edema or tenderness in extremities  Neurological: He is alert.  Oriented to self, month, place Motor: Grossly 4+/5 throughout--  dysphonic Skin: Skin is warm and dry. He is not diaphoretic.  Seborrheic dermatitis face Psychiatric: generally pleasant    Assessment/Plan: 1. Functional deficits secondary to debility/encephalopathy which require 3+ hours per day of interdisciplinary therapy in a comprehensive inpatient rehab setting.  Physiatrist is providing close team supervision and 24 hour management of active medical problems listed below.  Physiatrist and rehab team continue to assess barriers to discharge/monitor patient progress toward functional and medical goals  Care Tool:  Bathing  Bathing activity did not occur: Refused Body parts bathed by patient: Right arm, Left arm, Chest, Abdomen, Front perineal area, Buttocks, Right upper  leg, Left upper leg, Right lower leg, Left lower leg, Face         Bathing assist Assist Level: Supervision/Verbal cueing     Upper Body Dressing/Undressing Upper body dressing   What is the patient wearing?: Pull over shirt    Upper body assist Assist Level: Supervision/Verbal cueing    Lower Body Dressing/Undressing Lower body dressing      What is the patient wearing?: Hospital gown only, Incontinence brief     Lower body assist Assist for lower body dressing: Supervision/Verbal cueing     Toileting Toileting Toileting Activity did not occur (Clothing management and hygiene only): Refused  Toileting assist Assist for toileting: Supervision/Verbal cueing     Transfers Chair/bed transfer  Transfers assist  Chair/bed transfer activity did not occur: Safety/medical concerns  Chair/bed transfer assist level: Contact Guard/Touching assist     Locomotion Ambulation   Ambulation assist      Assist level: Supervision/Verbal cueing Assistive device: Walker-rolling Max distance: 54   Walk 10 feet activity   Assist     Assist level: Supervision/Verbal cueing Assistive device: Walker-rolling   Walk 50 feet activity   Assist    Assist level: Supervision/Verbal cueing Assistive device: Walker-rolling    Walk 150 feet activity   Assist    Assist level: Contact Guard/Touching assist Assistive device: No Device    Walk 10 feet on uneven surface  activity   Assist Walk 10 feet on uneven surfaces activity did not occur: Refused  Wheelchair     Assist Will patient use wheelchair at discharge?: No             Wheelchair 50 feet with 2 turns activity    Assist            Wheelchair 150 feet activity     Assist          Medical Problem List and Plan: 1.  Mobility deficits, limitations in endurance, limitation self-care, dysphagia secondary to debility.             -Continue CIR therapies including PT, OT,  and SLP  2.  Antithrombotics: -DVT/anticoagulation:  Pharmaceutical: Other (comment)--Eliquis             -antiplatelet therapy: N/A 3. Pain Management: Oxycodone prn 4. Mood: LCSW to follow for evaluation and support.              -antipsychotic agents: Klonopin, and Seroquel.  Plan to wean medications as tolerated but not today 5. Neuropsych: This patient is not fully capable of making decisions on his own behalf. 6. Skin/Wound Care: Routine pressure relief measures. Continue prostat.  7. Fluids/Electrolytes/Nutrition: encourage PO   -protein supp for low albumin  -labs normalizing. Dietary supp 8. COPD/hx of lung nodules: Continue duonebs prn. Was on symbicort in the past (2 years ago?).  Monitor respiratory rate with increased exertion.  -decannulated without issue  -OCCLUSIVE DRESSING for now 9. HTN: metoprolol 50mg  q8 hours Vitals:   06/06/18 0457 06/06/18 0825  BP: 136/65   Pulse: 84   Resp: 18   Temp: 98.2 F (36.8 C)   SpO2: 96% 94%  controlled 4/29 10. Delirium: improved 11.  Anemia : Continue iron supplement.   -hgb 8.3 4/28  -no clinical signs of bleeding at present  - hgb has ranged from 8.2 to 9.5  12. Dysphagia:  On dysphagia 2, nectars.   -eating and drinking well  -esophagram on 03/28/16 revealed spasm, has hx of GERD  -continue protonix 33meq bid  -continue imdur 15mg  daily. Seems to be helping along with improved cognition and concentration   LOS:   7 days A FACE TO FACE EVALUATION WAS PERFORMED  Meredith Staggers 06/06/2018, 9:59 AM

## 2018-06-06 NOTE — Patient Care Conference (Signed)
Inpatient RehabilitationTeam Conference and Plan of Care Update Date: 06/05/2018   Time: 2:45 PM    Patient Name: Kyle Lowery      Medical Record Number: [id number]  Date of Birth: Feb 22, 1950 Sex: Male         Room/Bed: 4W22C/4W22C-01 Payor Info: Payor: [place] / Plan: [employer] OTHER / Product Type: *No Product type* /    Admitting Diagnosis: debility  Admit Date/Time:  05/30/2018  3:20 PM Admission Comments: No comment available   Primary Diagnosis:  <principal problem not specified> Principal Problem: <principal problem not specified>  Patient Active Problem List   Diagnosis Date Noted  . Debility 05/30/2018  . Anemia   . Essential hypertension   . Chronic obstructive pulmonary disease ([place])   . Acute on chronic respiratory failure with hypoxia ([place])   . COPD, severe ([place])   . Pneumonia due to Pseudomonas aeruginosa ([place])   . Paroxysmal atrial fibrillation ([facility])   . Delirium, acute   . Malnutrition of moderate degree 04/24/2018  . Endotracheal tube present   . Ventilator dependent ([place])   . Influenzal pneumonia   . Acute exacerbation of chronic obstructive pulmonary disease (COPD) ([place])   . Pneumonia 04/22/2018  . Acute respiratory failure ([place]) 04/22/2018  . Multiple lung nodules on CT 02/12/2016  . Dysphagia 02/12/2016  . Depression 02/12/2016  . GERD (gastroesophageal reflux disease) 02/12/2016  . Pulmonary emphysema ([place]) 02/12/2016  . COPD, moderate ([place]) 04/22/2013  . Headache(784.0) 03/11/2011  . Vasovagal syncope 03/11/2011  . Tobacco use disorder 02/18/2010  . CORONARY ATHEROSCLEROSIS NATIVE CORONARY ARTERY 02/18/2010  . HYPERLIPIDEMIA 02/17/2010  . Essential hypertension, benign 02/17/2010    Expected Discharge Date: Expected Discharge Date: 06/12/18  Team Members Present: Physician leading conference: Dr. Alger Simons Social Worker Present: Lennart Pall, LCSW Nurse Present: Other (comment)(Crystal Virgia Land, RN) PT Present: Lavone Nian,  PT OT Present: Other (comment)(Sandra Rosana Hoes, OT) SLP Present: Weston Anna, SLP PPS Coordinator present : Gunnar Fusi     Current Status/Progress Goal Weekly Team Focus  Medical   debility and encephalopathy after respiratory failure, copd, trach out, wounds healing  improve activity tolerance and participation  pulmonary and nutritional considerations. ?urine retention, cognition, swallowing   Bowel/Bladder   Voiding in urinal and incont. episodes on Flomax, bladder scan > 350 cc, I/O cath,Patient is continent of bowels, has stool softener and prn laxatives     Assess QS/PRN toileting needs, I/O q6 hrs . 350 cc     Swallow/Nutrition/ Hydration   Dys. 2 textures with nectar-thick liquids, supervision   Supervision  Repeat MBS   ADL's   CGA for dynamic balance during transfers, CGA UB/LB bathing/dressing  Supervision bathing/dressing and transfers  Functional activity tolerance, dynamic standing balance, ADL retraining   Mobility   min assist overall, gait without AD  supervision overall with LRAD  balance, transfers, gait, stair negotiation, cognitive remediation, endurance, awareness   Communication   Min A   Supervision  speech intelligibility strategies   Safety/Cognition/ Behavioral Observations  Mod A   Min A   attention and awareness    Pain   rate pain > 6 Chronic Lower back Tramadol 50 mg tolerated relief 3-4  < 3   Assess QS/PRN medicate with prn reassess effectivenss, refer to medical team as needed   Skin   assess QS/PRN -s/p Trach site 9 discont 4/27  skin integrity intact,   Assess QS/PRN    Rehab Goals Patient on target to meet rehab goals:  Yes *See Care Plan and progress notes for long and short-term goals.     Barriers to Discharge  Current Status/Progress Possible Resolutions Date Resolved   Physician    Medical stability;Nutrition means        ongoing medical mgt as above      Nursing                  PT  Behavior;Decreased caregiver support  unsure  if pt has 24 hr supervision at d/c              OT                  SLP                SW                Discharge Planning/Teaching Needs:  Pt to return home with wife as primary caregiver and other family/ friends able to assist her in providing 24/7 assistance.  Teaching to be planned closer to d/c date.   Team Discussion:  Lurline Idol out;  Cognition improved;  Labs better.  nsg continues to check PVRs.  CGA overall with PT/OT with supervision goals.  Plan for MBS tomorrow and hope to upgrade.  Revisions to Treatment Plan:  NA    Continued Need for Acute Rehabilitation Level of Care: The patient requires daily medical management by a physician with specialized training in physical medicine and rehabilitation for the following conditions: Daily direction of a multidisciplinary physical rehabilitation program to ensure safe treatment while eliciting the highest outcome that is of practical value to the patient.: Yes Daily medical management of patient stability for increased activity during participation in an intensive rehabilitation regime.: Yes Daily analysis of laboratory values and/or radiology reports with any subsequent need for medication adjustment of medical intervention for : Post surgical problems;Neurological problems;Wound care problems;Pulmonary problems   I attest that I was present, lead the team conference, and concur with the assessment and plan of the team.   Lennart Pall 06/06/2018, 11:34 AM    Team conference was held via web/ teleconference due to [place] - 19\

## 2018-06-06 NOTE — Progress Notes (Addendum)
Modified Barium Swallow Progress Note  Patient Details  Name: Kyle Lowery MRN: [id number] Date of Birth: March 12, 1950  Today's Date: 06/06/2018  Modified Barium Swallow completed.  Full report located under Chart Review in the Imaging Section.  Brief recommendations include the following:  Clinical Impression  Patient demonstrates a mild-moderate pharyngeal phase dysphagia characterized by incomplete laryngeal closure resulting in silent aspiration of thin liquids with large, sequential sips via straw. Patient able to clear the majority of the aspirates with a cued cough. Aspiration was eliminated with small single, sips via straw with only intermittent trace silent penetration noted that cleared intermittently with a cued throat clear.  Recommend patient continue Dys. 2 textures due to missing dentition and sore gums (due to patient reports) impacting ability to masticate solid textures and upgrade to thin liquids via cup or straw with single sips. Also recommend patient continue full supervision and to avoid mixed consistencies in order to maximize safety. Patient educated on results and recommendations and verbalized understanding.    Swallow Evaluation Recommendations       SLP Diet Recommendations: Thin liquid;Dysphagia 2 (Fine chop) solids;No mixed consistencies   Liquid Administration via: Cup;Straw(single sips )   Medication Administration: Whole meds with puree   Supervision: Patient able to self feed;Full supervision/cueing for compensatory strategies   Compensations: Minimize environmental distractions;Slow rate;Small sips/bites;Multiple dry swallows after each bite/sip;Clear throat intermittently       Oral Care Recommendations: Oral care BID        Shawnay Bramel 06/06/2018,10:30 AM

## 2018-06-06 NOTE — Progress Notes (Signed)
Physical Therapy Session Note  Patient Details  Name: Kyle Lowery MRN: [id number] Date of Birth: 04-Oct-1950  Today's Date: 06/06/2018 PT Individual Time: 1100-1130 PT Individual Time Calculation (min): 30 min   Short Term Goals:  Week 2:  PT Short Term Goal 1 (Week 2): STG = LTG due to ELOS.     Skilled Therapeutic Interventions/Progress Updates:  Pt resting in bed.  Supine Therapeutic exercise performed with LEs to increase strength for functional mobility: 10 x 1 each lower trunk rotation, bil adductor squeezes, bil bridging, R/L straight leg raises, ankle pumps.  PROM bil heel cords.  Gait training without AD on level tile x 150' through obstacle course weaving in/out of diamond shaped tiles in hallway, min assist for frequent mild LOB.  Seated in recliner, self stretching R/L hamstrings and heel cords, x 30 seconds x 3; max cues for accuracy and timekeeping despite cues to use clock for each bout of stretching.    Pt left resting in recliner with needs at hand, seat belt alarm set.     Therapy Documentation Precautions:  Precautions Precautions: Fall Precaution Comments: Recently decannulated, stoma occluded Restrictions Weight Bearing Restrictions: No  Pain: Pain Assessment Pain Scale: 0-10 Pain Score: 4/10, low back, premedicated     Therapy/Group: Individual Therapy  Garnetta Fedrick 06/06/2018, 11:41 AM

## 2018-06-06 NOTE — Progress Notes (Addendum)
Occupational Therapy Session Note  Patient Details  Name: Kyle Lowery MRN: [id number] Date of Birth: 08/15/1950  Today's Date: 06/06/2018 OT Individual Time: 0730-0830 Session 2: 1530-1545 OT Individual Time Calculation (min): 60 min Session 2: 15 min 15 min missed d/t nursing care (cath)   Short Term Goals: Week 1:  OT Short Term Goal 1 (Week 1): Pt will perform LB clothing management after toileting with min guard for balance.  OT Short Term Goal 2 (Week 1): Pt will perform UB clothing management with min A. OT Short Term Goal 3 (Week 1): Pt will perform LB dressing with mod A.   Skilled Therapeutic Interventions/Progress Updates:    Pt received supine, easily awoken with vc. Pt with c/o "being stiff" but no pain. Pt set up to eat breakfast, with choice of cereal verified as appropriate for D2 diet with SLP. Thickened coffee prepared for pt- which he was very excited for. Pt completed entire meal with good adherence to small bites and pacing. Pt completed functional mobility into bathroom with CGA. Pt transferred onto toilet with close (S). Pt required several minutes to void both bowel/bladder. Discussion with pt during this re need for shower chair/stool. Pt concerned with cost and care coordination completed with social work re cost. Following toileting, pt donned new brief and pants, with cueing required to complete this seated. Pt left sitting up in recliner with RT present.   Session 2: Entered room to nursing staff performing cath, first 15 min missed of session. Pt agreeable to session with no c/o pain. Pt completed 150 ft x2 of functional mobility with CGA, several slight LOB with occasional scissoring of LE. Pt completed simulated walk in shower transfer with CGA x2. Discussed safety awareness and strategies for fall risk reduction with pt. Pt returned to room and left supine with all needs met, bed alarm set.     Therapy Documentation Precautions:  Precautions Precautions:  Fall Precaution Comments: Recently decannulated, stoma occluded Restrictions Weight Bearing Restrictions: No  Therapy/Group: Individual Therapy   H  06/06/2018, 7:11 AM  

## 2018-06-07 ENCOUNTER — Inpatient Hospital Stay (HOSPITAL_COMMUNITY): Payer: BLUE CROSS/BLUE SHIELD

## 2018-06-07 ENCOUNTER — Inpatient Hospital Stay (HOSPITAL_COMMUNITY): Payer: BLUE CROSS/BLUE SHIELD | Admitting: Speech Pathology

## 2018-06-07 NOTE — Progress Notes (Signed)
Speech Language Pathology Weekly Progress and Session Note  Patient Details  Name: Kyle Lowery MRN: [id number] Date of Birth: [birth year]  Beginning of progress report period: May 31, 2018 End of progress report period: June 07, 2018  Today's Date: 06/07/2018 SLP Individual Time: 1345-1430 SLP Individual Time Calculation (min): 45 min  Short Term Goals: Week 1: SLP Short Term Goal 1 (Week 1): Patient will consume current diet with minimal overt s/s of aspiration with Min A verbal cues for use of swallowing compensatory strategies.  SLP Short Term Goal 1 - Progress (Week 1): Met SLP Short Term Goal 2 (Week 1): Patient will consume trials of ice chips/thin liquids with minimal overt s/s of aspiration over 2 sessions ot assess readiness for repeat MBS SLP Short Term Goal 2 - Progress (Week 1): Met SLP Short Term Goal 3 (Week 1): Patient will utilize an increased vocal intensity to achieve ~90% intelligibility at the sentence level with Min A verbal cues.  SLP Short Term Goal 3 - Progress (Week 1): Met SLP Short Term Goal 4 (Week 1): Patient will orient to time, place and situation with Mod A multimodal cues.  SLP Short Term Goal 4 - Progress (Week 1): Met SLP Short Term Goal 5 (Week 1): Patient will demonstrate sustained attention to functional tasks for ~10 minutes with Mod A verbal cues for redirection.  SLP Short Term Goal 5 - Progress (Week 1): Met SLP Short Term Goal 6 (Week 1): Patient will demonstrate intellectual awareness of physical and cognitive deficits with Mod A verbal cues.  SLP Short Term Goal 6 - Progress (Week 1): Met    New Short Term Goals: Week 2: SLP Short Term Goal 1 (Week 2): Patient will consume current diet with minimal overt s/s of aspiration with Mod I.  SLP Short Term Goal 2 (Week 2): Patient will demonstrate selective attention to tasks in a mildly distracting enviornment for 30 minutes with supervision verbal cues for redirection.  SLP Short Term Goal 3  (Week 2): Patient will utilize speech intelligibility strategies at the sentence level to achieve 90% intelligibility with supervision verbal cues.  SLP Short Term Goal 4 (Week 2): Patient will orient to time, place and situation with Mod I.  SLP Short Term Goal 5 (Week 2): Patient will self-monitor and correct errors during functional tasks with supervision verbal cues.   Weekly Progress Updates: Patient has made excellent gains and has met 6 of 5 STGs this reporting period. Currently, patient is consuming Dys. 2 textures with thin liquids with minimal overt s/s of aspiration and overall supervision level verbal cues for use of swallowing compensatory strategies. Patient demonstrates improved speech intelligibility and is overall ~90% intelligible at the sentence level with Min A verbal cues for use of compensatory strategies. Patient also demonstrates improved cognitive functioning and requires overall Min A verbal cues for attention, recall and awareness. Patient and family education ongoing. Patient would benefit from continued skilled SLP intervention to maximize his cognitive, speech and swallowing function prior to discharge.      Intensity: Minumum of 1-2 x/day, 30 to 90 minutes Frequency: 3 to 5 out of 7 days Duration/Length of Stay: 5/5 Treatment/Interventions: Cognitive remediation/compensation;Environmental controls;Internal/external aids;Speech/Language facilitation;Therapeutic Activities;Patient/family education;Functional tasks;Dysphagia/aspiration precaution training;Cueing hierarchy   Daily Session  Skilled Therapeutic Interventions: Skilled treatment session focused on dysphagia and cognitive goals. SLP facilitated session by providing skilled observation with lunch meal of Dys. 2 textures with thin liquids. Patient consumed meal without overt s/s of aspiration with overall  Mod I for use of swallowing compensatory strategies. SLP also facilitated session by providing Mod A verbal  cues for recall of his current medications and their functions and Mod-Max A verbal cues for complex problem solving while organizing a 4 time per day pill box. Patient left upright in recliner with all needs within reach. Continue with current plan of care.       Pain Headache, RN aware and administered medications  Therapy/Group: Individual Therapy  [place] Grayer 06/07/2018, 6:35 AM

## 2018-06-07 NOTE — Progress Notes (Signed)
Physical Therapy Session Note  Patient Details  Name: Kyle Lowery MRN: [id number] Date of Birth: [birth year]  Today's Date: 06/07/2018 PT Individual Time: Session1: [id number] Captain: [id number] PT Individual Time Calculation (min):Session1: 29 min; Session2: 33 min  Short Term Goals: Week 1:  PT Short Term Goal 1 (Week 2): STG = LTG due to ELOS.  Skilled Therapeutic Interventions/Progress Updates:    Session1:  Patient in supine reports needs new pants.  Obtained small paper scrubs and pt donned in sitting with CGA for sit to stand.  Patient ambulated to dayroom with CGA with veering to sides and occasional side steps to catch balance.  Dynamic gait and coordination/balance activities in dayroom consisting of side stepping, tandem gait forward march, gait with ball toss to self all with CGA to min A.  Patient c/o R foot pain and noted small hardened area @ 5th MTH.  Patient encouraged to wear shoes next session.  Patient ambulated to room and left in supine with call bell in reach and bed alarm activated.  [place]:  Patient in supine and reports headache due to in/out cath.  Agreeable to PT encouraged to participate to play Wii.  Patient S for bed mobility and gait to dayroom with CGA for safety due to veering and occasional imbalance with side steps.  Patient standing to play golf game on Wii, reports difficulty remembering how to play despite having had a Wii at home, just lost controller.  Patient standing about 10-15 min with intermittent seated rest breaks to play golf game on Wii, two LOB with CGA for safe recovery.  Patient ambulated back to room with CGA to S.  Climbed into bed with S and left with call bell in reach and bed alarm activated.    Therapy Documentation Precautions:  Precautions Precautions: Fall Precaution Comments: Recently decannulated, stoma occluded Restrictions Weight Bearing Restrictions: No Pain: Pain Assessment Pain Score: 7  Pain Type: Chronic pain Pain  Location: Back Pain Orientation: Lower Pain Descriptors / Indicators: Aching Pain Onset: On-going Pain Intervention(s): Repositioned;Heat applied    Therapy/Group: Individual Therapy  Reginia Naas  Magda Kiel, PT 06/07/2018, 12:25 PM

## 2018-06-07 NOTE — Progress Notes (Signed)
PHYSICAL MEDICINE & REHABILITATION PROGRESS NOTE   Subjective/Complaints: No new issues. Slept fairly well  ROS: Patient denies fever, rash, sore throat, blurred vision, nausea, vomiting, diarrhea, cough, shortness of breath or chest pain, joint or back pain, headache, or mood change.    Objective:   Dg Swallowing Func-speech Pathology  Result Date: 06/06/2018 Objective Swallowing Evaluation: Type of Study: MBS-Modified Barium Swallow Study  Patient Details Name: Kyle Lowery MRN: [id number] Date of Birth: 1951/01/14 Today's Date: 06/06/2018 Time: SLP Start Time (ACUTE ONLY): 0915 -SLP Stop Time (ACUTE ONLY): 0940 SLP Time Calculation (min) (ACUTE ONLY): 25 min Past Medical History: Past Medical History: Diagnosis Date . Acute on chronic respiratory failure with hypoxia ([place])  . Carotid artery stenosis  . COPD (chronic obstructive pulmonary disease) ([place])  . COPD, severe ([place])  . Coronary atherosclerosis of native coronary artery   Mild to moderate NOCAD with 80% ostial diagonal 1/12 . Delirium, acute  . Depression   Prior suicide attempt . Essential hypertension, benign  . GERD (gastroesophageal reflux disease)   Esophageal dilatation . Mixed hyperlipidemia  . Paroxysmal atrial fibrillation ([facility])  . Pneumonia due to Pseudomonas aeruginosa ([place])  . Syncope   Neurocardiogenic Past Surgical History: Past Surgical History: Procedure Laterality Date . ESOPHAGOGASTRODUODENOSCOPY (EGD) WITH ESOPHAGEAL DILATION   . INGUINAL HERNIA REPAIR   . LEFT HEART CATH    Unable to Stent Blockage . Left orchiectomy   . VASECTOMY   HPI: See H&P  No data recorded Assessment / Plan / Recommendation CHL IP CLINICAL IMPRESSIONS 06/06/2018 Clinical Impression Patient demonstrates a mild-moderate pharyngeal phase dysphagia characterized by incomplete laryngeal closure resulting in silent aspiration of thin liquids with large, sequential sips via straw. Patient able to clear the majority of the aspirates with a cued  cough. Aspiration was eliminated with small single, sips via straw with only intermittent trace silent penetration noted that cleared intermittently with a cued throat clear.  Recommend patient continued Dys. 2 textures due to missing dentition and upgrade to thin liquids via cup or straw with single sips and to avoid mixed consistencies. Also recommend patient continue full supervision to maximize safety. Patient educated on results and recommendations and verbalized understanding.  SLP Visit Diagnosis Dysphagia, pharyngeal phase (R13.13) Attention and concentration deficit following -- Frontal lobe and executive function deficit following -- Impact on safety and function Mild aspiration risk;Moderate aspiration risk   CHL IP TREATMENT RECOMMENDATION 06/06/2018 Treatment Recommendations Therapy as outlined in treatment plan below   Prognosis 06/06/2018 Prognosis for Safe Diet Advancement Good Barriers to Reach Goals Cognitive deficits Barriers/Prognosis Comment -- CHL IP DIET RECOMMENDATION 06/06/2018 SLP Diet Recommendations Dysphagia 3 (Mech soft) solids;Thin liquid Liquid Administration via Cup;Straw Medication Administration Whole meds with puree Compensations Minimize environmental distractions;Slow rate;Small sips/bites;Multiple dry swallows after each bite/sip;Clear throat intermittently Postural Changes --   CHL IP OTHER RECOMMENDATIONS 06/06/2018 Recommended Consults -- Oral Care Recommendations Oral care BID Other Recommendations --   CHL IP FOLLOW UP RECOMMENDATIONS 06/06/2018 Follow up Recommendations Home health SLP;24 hour supervision/assistance   CHL IP FREQUENCY AND DURATION 06/06/2018 Speech Therapy Frequency (ACUTE ONLY) min 3x week Treatment Duration 1 week      CHL IP ORAL PHASE 06/06/2018 Oral Phase Impaired Oral - Pudding Teaspoon -- Oral - Pudding Cup -- Oral - Honey Teaspoon -- Oral - Honey Cup -- Oral - Nectar Teaspoon -- Oral - Nectar Cup WFL Oral - Nectar Straw -- Oral - Thin Teaspoon WFL Oral -  Thin Cup [facility]  Oral - Thin Straw WFL Oral - Puree Piecemeal swallowing Oral - Mech Soft Impaired mastication;Piecemeal swallowing Oral - Regular -- Oral - Multi-Consistency -- Oral - Pill -- Oral Phase - Comment --  CHL IP PHARYNGEAL PHASE 06/06/2018 Pharyngeal Phase Impaired Pharyngeal- Pudding Teaspoon -- Pharyngeal -- Pharyngeal- Pudding Cup -- Pharyngeal -- Pharyngeal- Honey Teaspoon -- Pharyngeal -- Pharyngeal- Honey Cup -- Pharyngeal -- Pharyngeal- Nectar Teaspoon -- Pharyngeal -- Pharyngeal- Nectar Cup Delayed swallow initiation-vallecula Pharyngeal -- Pharyngeal- Nectar Straw -- Pharyngeal -- Pharyngeal- Thin Teaspoon WFL Pharyngeal -- Pharyngeal- Thin Cup Delayed swallow initiation-pyriform sinuses;Reduced airway/laryngeal closure;Penetration/Aspiration during swallow Pharyngeal Material does not enter airway;Material enters airway, CONTACTS cords and not ejected out;Material enters airway, remains ABOVE vocal cords then ejected out Pharyngeal- Thin Straw Delayed swallow initiation-pyriform sinuses;Penetration/Aspiration before swallow;Reduced airway/laryngeal closure Pharyngeal Material does not enter airway;Material enters airway, remains ABOVE vocal cords and not ejected out;Material enters airway, passes BELOW cords without attempt by patient to eject out (silent aspiration) Pharyngeal- Puree Delayed swallow initiation-vallecula Pharyngeal -- Pharyngeal- Mechanical Soft Delayed swallow initiation-vallecula Pharyngeal -- Pharyngeal- Regular -- Pharyngeal -- Pharyngeal- Multi-consistency -- Pharyngeal -- Pharyngeal- Pill -- Pharyngeal -- Pharyngeal Comment --  No flowsheet data found. Kyle Anna, MA, CCC-SLP [phone] [place], [place] 06/06/2018, 10:06 AM              No results for input(s): WBC, HGB, HCT, PLT in the last 72 hours. No results for input(s): NA, K, CL, CO2, GLUCOSE, BUN, CREATININE, CALCIUM in the last 72 hours.  Intake/Output Summary (Last 24 hours) at 06/07/2018 0953 Last data filed  at 06/07/2018 0600 Gross per 24 hour  Intake 360 ml  Output 1725 ml  Net -1365 ml     Physical Exam: Vital Signs Blood pressure (!) 110/56, pulse 78, temperature 98.2 F (36.8 C), temperature source Oral, resp. rate 18, height 5\' 8"  (1.727 m), weight 58.2 kg, SpO2 96 %. Constitutional: No distress . Vital signs reviewed. HEENT: EOMI, oral membranes moist Neck: supple, trach stoma with hypergranulated tissue Cardiovascular: RRR without murmur. No JVD    Respiratory: CTA Bilaterally without wheezes or rales. Normal effort    GI: BS +, non-tender, non-distended  Musculoskeletal:     Comments: No edema or tenderness in extremities  Neurological: He is alert.  Oriented to self, month, place, person Motor: 4+/5 Dysphonia improving.  Skin: Skin is warm and dry. He is not diaphoretic.  Seborrheic dermatitis face Psychiatric: pleasant    Assessment/Plan: 1. Functional deficits secondary to debility/encephalopathy which require 3+ hours per day of interdisciplinary therapy in a comprehensive inpatient rehab setting.  Physiatrist is providing close team supervision and 24 hour management of active medical problems listed below.  Physiatrist and rehab team continue to assess barriers to discharge/monitor patient progress toward functional and medical goals  Care Tool:  Bathing  Bathing activity did not occur: Refused Body parts bathed by patient: Right arm, Left arm, Chest, Abdomen, Front perineal area, Buttocks, Right upper leg, Left upper leg, Right lower leg, Left lower leg, Face         Bathing assist Assist Level: Supervision/Verbal cueing     Upper Body Dressing/Undressing Upper body dressing   What is the patient wearing?: Pull over shirt    Upper body assist Assist Level: Supervision/Verbal cueing    Lower Body Dressing/Undressing Lower body dressing      What is the patient wearing?: Hospital gown only, Incontinence brief     Lower body assist Assist for  lower body dressing: Supervision/Verbal  cueing     Toileting Toileting Toileting Activity did not occur Landscape architect and hygiene only): Refused  Toileting assist Assist for toileting: Supervision/Verbal cueing     Transfers Chair/bed transfer  Transfers assist  Chair/bed transfer activity did not occur: Safety/medical concerns  Chair/bed transfer assist level: Contact Guard/Touching assist     Locomotion Ambulation   Ambulation assist      Assist level: Minimal Assistance - Patient > 75% Assistive device: No Device Max distance: 157ft   Walk 10 feet activity   Assist     Assist level: Minimal Assistance - Patient > 75% Assistive device: No Device   Walk 50 feet activity   Assist    Assist level: Minimal Assistance - Patient > 75% Assistive device: No Device    Walk 150 feet activity   Assist    Assist level: Minimal Assistance - Patient > 75% Assistive device: No Device    Walk 10 feet on uneven surface  activity   Assist Walk 10 feet on uneven surfaces activity did not occur: Refused         Wheelchair     Assist Will patient use wheelchair at discharge?: No             Wheelchair 50 feet with 2 turns activity    Assist            Wheelchair 150 feet activity     Assist          Medical Problem List and Plan: 1.  Mobility deficits, limitations in endurance, limitation self-care, dysphagia secondary to debility.             -Continue CIR therapies including PT, OT, and SLP  2.  Antithrombotics: -DVT/anticoagulation:  Pharmaceutical: Other (comment)--Eliquis             -antiplatelet therapy: N/A 3. Pain Management: Oxycodone prn 4. Mood: LCSW to follow for evaluation and support.              -antipsychotic agents: Klonopin, and Seroquel.  Plan to wean medications as tolerated but not today 5. Neuropsych: This patient is not fully capable of making decisions on his own behalf. 6. Skin/Wound Care:  Routine pressure relief measures. Continue prostat.  7. Fluids/Electrolytes/Nutrition: encourage PO   -protein supp for low albumin  Intake improving 8. COPD/hx of lung nodules: Continue duonebs prn. Was on symbicort in the past (2 years ago?).  Monitor respiratory rate with increased exertion.  -decannulated without issue  -apply silver nitrate to granulation tissue today 9. HTN: metoprolol 50mg  q8 hours Vitals:   06/07/18 0521 06/07/18 0756  BP: (!) 110/56   Pulse: 78   Resp: 18   Temp: 98.2 F (36.8 C)   SpO2: 97% 96%  controlled 4/30 10. Delirium: improved 11.  Anemia : Continue iron supplement.   -hgb 8.3 4/28  -no clinical signs of bleeding at present  - hgb has ranged from 8.2 to 9.5  12. Dysphagia:  On dysphagia 2, nectars.   -eating and drinking well  -esophagram on 03/28/16 revealed spasm, has hx of GERD  -continue protonix 17meq bid  -continue imdur 15mg  daily. Seems to be helping along with improved cognition and concentration   LOS:   8 days A FACE TO FACE EVALUATION WAS PERFORMED  Meredith Staggers 06/07/2018, 9:53 AM

## 2018-06-07 NOTE — Progress Notes (Signed)
Speech Language Pathology Daily Session Note  Patient Details  Name: Kyle Lowery MRN: [id number] Date of Birth: [birth year]  Today's Date: 06/07/2018 SLP Individual Time: 0940-1030 SLP Individual Time Calculation (min): 50 min  Short Term Goals: Week 2: SLP Short Term Goal 1 (Week 2): Patient will consume current diet with minimal overt s/s of aspiration with Mod I.  SLP Short Term Goal 2 (Week 2): Patient will demonstrate selective attention to tasks in a mildly distracting enviornment for 30 minutes with supervision verbal cues for redirection.  SLP Short Term Goal 3 (Week 2): Patient will utilize speech intelligibility strategies at the sentence level to achieve 90% intelligibility with supervision verbal cues.  SLP Short Term Goal 4 (Week 2): Patient will orient to time, place and situation with Mod I.  SLP Short Term Goal 5 (Week 2): Patient will self-monitor and correct errors during functional tasks with supervision verbal cues.   Skilled Therapeutic Interventions: Pt seen for skilled ST intervention targeting goals for swallowing and cognition. Pt reported he was happy about having thin liquids, saying he "passed that (MBS) real good, except I need to sip...but I cheat." Pt admitted to drinking water quickly. SLP provided education regarding the importance of adherence to recommended safe swallow precautions, and reviewed basics of swallow function and oral care. Visual aids were used, and left with pt. Pt was observed drinking thin Coke, taking small individual sips. No overt s/s aspiration demonstrated. Pt continues to require repetition cues intermittently due to low vocal intensity, however, he reports he has always been soft spoken, but that when he gets mad, that's when he "gets hot". Pt was oriented to person, place, time, and situation (I been sick) today without need of environmental cues. When reviewing his therapy schedule for the day, pt indicated he couldn't understand what he  has when. SLP reviewed information left earlier this week regarding basic focus of PT/OT/ST, and moved the paper so he could more readily read it. Therapy schedule was clarified with PT, OT and speech/swallowing written on the paper. Pt completed 2 "easy" mazes (1, 10) in less than one minute. #15 required more time and attention, and was left with pt to complete before next ST session. Pt was left in bed with alarm set, all needs within reach. Continue ST per current plan of care.   Pain Pain Assessment Pain Score: 7  Pain Type: Chronic pain Pain Location: Buttocks Pain Orientation: Lower Pain Descriptors / Indicators: Aching Pain Onset: On-going Patients Stated Pain Goal: 0 Pain Intervention(s): Repositioned  Therapy/Group: Individual Therapy   Kyle Lowery B. Kyle Lowery, [facility], CCC-SLP Speech Language Pathologist  Kyle Lowery 06/07/2018, 1:26 PM

## 2018-06-07 NOTE — Progress Notes (Signed)
Occupational Therapy Session Note  Patient Details  Name: Kyle Lowery MRN: [id number] Date of Birth: [birth year]  Today's Date: 06/07/2018 OT Individual Time: 0867-6195 OT Individual Time Calculation (min): 57 min    Short Term Goals: Week 1:  OT Short Term Goal 1 (Week 1): Pt will perform LB clothing management after toileting with min guard for balance.  OT Short Term Goal 2 (Week 1): Pt will perform UB clothing management with min A. OT Short Term Goal 3 (Week 1): Pt will perform LB dressing with mod A.   Skilled Therapeutic Interventions/Progress Updates:    1:1. No pain reported. Pt agreeble to shower. Pt ambulates with S into shower and requires VC for doffing pants seated. Pt requires VC while bathing standing up to use grab bar to steady self when washing buttocks and washing hair because eyes are closed and increased sway laterally. Pt dresses sit to stand with supervision overall with VC for feet underneath pt during sit to stand as first attempt to stand pt unable to achieve full stand. Pt grooms at sink with supervision. Pt requesting to complete LE exercises. Pt completes 3x10 reps of squats at parallel bars with VC for sitting hips back instead of flexing trunk forward. Exited session with pt seated in bed, exit alamr ona nd call light in reach  Therapy Documentation Precautions:  Precautions Precautions: Fall Precaution Comments: Recently decannulated, stoma occluded Restrictions Weight Bearing Restrictions: No General:   Vital Signs: Oxygen Therapy SpO2: 96 % O2 Device: Room Air Pain:   ADL:   Vision   Perception    Praxis   Exercises:   Other Treatments:     Therapy/Group: Individual Therapy  Tonny Branch 06/07/2018, 11:41 AM

## 2018-06-07 NOTE — Progress Notes (Signed)
Social Work Patient ID: Kyle Lowery, male   DOB: 01-06-1951, 68 y.o.   MRN: [id number]   Have reviewed team conference with pt and wife.  Both aware and agreeable with targeted d/c date of 5/5 and supervision goals.  Have scheduled for wife to be here @ 10:00 am on Monday to complete education with team.  Continue to follow.  Aniel Hubble, LCSW

## 2018-06-08 ENCOUNTER — Inpatient Hospital Stay (HOSPITAL_COMMUNITY): Payer: BLUE CROSS/BLUE SHIELD | Admitting: Physical Therapy

## 2018-06-08 ENCOUNTER — Inpatient Hospital Stay (HOSPITAL_COMMUNITY): Payer: BLUE CROSS/BLUE SHIELD

## 2018-06-08 ENCOUNTER — Inpatient Hospital Stay (HOSPITAL_COMMUNITY): Payer: BLUE CROSS/BLUE SHIELD | Admitting: Speech Pathology

## 2018-06-08 NOTE — Progress Notes (Signed)
Speech Language Pathology Daily Session Note  Patient Details  Name: Kyle Lowery MRN: [id number] Date of Birth: 09-Feb-1950  Today's Date: 06/08/2018 SLP Individual Time: 1305-1400 SLP Individual Time Calculation (min): 55 min  Short Term Goals: Week 2: SLP Short Term Goal 1 (Week 2): Patient will consume current diet with minimal overt s/s of aspiration with Mod I.  SLP Short Term Goal 2 (Week 2): Patient will demonstrate selective attention to tasks in a mildly distracting enviornment for 30 minutes with supervision verbal cues for redirection.  SLP Short Term Goal 3 (Week 2): Patient will utilize speech intelligibility strategies at the sentence level to achieve 90% intelligibility with supervision verbal cues.  SLP Short Term Goal 4 (Week 2): Patient will orient to time, place and situation with Mod I.  SLP Short Term Goal 5 (Week 2): Patient will self-monitor and correct errors during functional tasks with supervision verbal cues.   Skilled Therapeutic Interventions: Skilled treatment session focused on dysphagia and cognitive goals. SLP facilitated session by providing trials of Dys. 3 textures. Patient demonstrated efficient mastication and complete oral clearance but reported pain with mastication due sore gums. Recommend trial tray prior to upgrade. Patient also administered the Cognistat and patient scored mild impairments in attention and calculations but scored [facility] for all other subtests.  Patient left upright in bed with alarm on and all needs within reach. Continue with current plan of care.       Pain Pain Assessment Pain Score: 4   Therapy/Group: Individual Therapy  Anuar Walgren 06/08/2018, 3:07 PM

## 2018-06-08 NOTE — Progress Notes (Signed)
Occupational Therapy Session Note  Patient Details  Name: Kyle Lowery MRN: [id number] Date of Birth: [birth year]  Today's Date: 06/08/2018 OT Individual Time: 2376-2831 OT Individual Time Calculation (min): 71 min    Short Term Goals: Week 1:  OT Short Term Goal 1 (Week 1): Pt will perform LB clothing management after toileting with min guard for balance.  OT Short Term Goal 2 (Week 1): Pt will perform UB clothing management with min A. OT Short Term Goal 3 (Week 1): Pt will perform LB dressing with mod A.   Skilled Therapeutic Interventions/Progress Updates:    1;1. Pt agreeable to OT, however declines bathing/dressing at first. Pt c/o back pain and RN delivers pain medication. Pt ambulates throughout session wihtsupervision and no AD. Pt completes biodex activities: weight bearing (1 min, 23% of time holding CoG), limits of stability (23%, requiring 1 min 52 sec), maze ( 64min 38 seconds, 3% of time keeping CoG in between lines), and ball toss (3 min, .24 reaction time to initate weight shift, and 4.85 second to shift weight to target). Pt demo poor weight shifting strategy imacting balance requiring intermittant CGA facilitation at hips to begin weight shift. Pt requesting to shower now. Pt compeltes bathing in standing with min question cues to remember to stabilize on grab bar when eyes closed washing har/buttocks and sit to stand  Level for dressing with VC for seated threading of BLE with supervision. Pt demo LOB posteriorly and laterally that is able to fis with stepping strategy. Pt grooms at sink standing. Hot packs provided to pt for low back pain, RN aware. Exited session with pt seated in bedd, exit alarm on and call light in reach   Therapy Documentation Precautions:  Precautions Precautions: Fall Precaution Comments: Recently decannulated, stoma occluded Restrictions Weight Bearing Restrictions: No General:   Vital Signs: Oxygen Therapy SpO2: 98 % O2 Device: Room  Air Pain: Pain Assessment Pain Scale: 0-10 Pain Score: 6  Pain Type: Acute pain Pain Location: Back Pain Orientation: Lower Pain Frequency: Constant Pain Onset: On-going Patients Stated Pain Goal: 0 Pain Intervention(s): Medication (See eMAR) ADL:   Vision   Perception    Praxis   Exercises:   Other Treatments:     Therapy/Group: Individual Therapy  Tonny Branch 06/08/2018, 10:36 AM

## 2018-06-08 NOTE — Progress Notes (Signed)
Physical Therapy Session Note  Patient Details  Name: Kyle Lowery MRN: [id number] Date of Birth: [birth year]  Today's Date: 06/08/2018 PT Individual Time: 0807-0917 PT Individual Time Calculation (min): 70 min   Short Term Goals: Week 2:  PT Short Term Goal 1 (Week 2): STG = LTG due to ELOS.  Skilled Therapeutic Interventions/Progress Updates:  Pt received in bed with RN present administering meds. Pt reports he does have a walking stick at home and is willing to trial [facility] today. Provided pt with paper scrub pants & pt dons them with distant supervision. Pt ambulates with SPC and CGA<>close supervision with continued lateral sway 2/2 impaired weight shifting L. During session therapist educated pt on inability to drive until cleared by MD but question pt's ability to retain information. Pt utilized kinetron while standing without BUE support and min assist with task focusing on weight shifting L<>R, dynamic balance, and BLE strengthening. Pt completes floor transfer with supervision and while on mat on floor therapist stretches B hamstrings per pt request. While on floor pt performed bird dog exercises with max multimodal cuing for technique and min assist for balance with task focusing on core strengthening & balance. Pt negotiates 8 steps with 1 rail (L then R) to simulate home entry with supervision. Pt stood on compliant surface with normal and narrow BOS while engaging in trampoline ball toss with mod assist 2/2 one LOB and min fade to CGA otherwise. Pt ambulates while carrying cups stacked on board with CGA<>close supervision focusing on dynamic balance. At end of session pt ambulates back to room without AD with close supervision with less instances of lateral sway without SPC - will continue to focus on gait without AD. At end of session pt left in bed with alarm set & needs at hand.  Therapy Documentation Precautions:  Precautions Precautions: Fall Precaution Comments: Recently  decannulated, stoma occluded Restrictions Weight Bearing Restrictions: No  Pain: Pt reports B calf soreness following yesterday's session & chronic back pain - rest breaks provided PRN.   Therapy/Group: Individual Therapy  Waunita Schooner 06/08/2018, 9:20 AM

## 2018-06-08 NOTE — Progress Notes (Signed)
[place] PHYSICAL MEDICINE & REHABILITATION PROGRESS NOTE   Subjective/Complaints: No new complaints. up in bed with therapy  ROS: Patient denies fever, rash, sore throat, blurred vision, nausea, vomiting, diarrhea, cough, shortness of breath or chest pain, joint or back pain, headache, or mood change.    Objective:   No results found. No results for input(s): WBC, HGB, HCT, PLT in the last 72 hours. No results for input(s): NA, K, CL, CO2, GLUCOSE, BUN, CREATININE, CALCIUM in the last 72 hours.  Intake/Output Summary (Last 24 hours) at 06/08/2018 1049 Last data filed at 06/08/2018 0951 Gross per 24 hour  Intake 940 ml  Output 1725 ml  Net -785 ml     Physical Exam: Vital Signs Blood pressure 133/66, pulse 67, temperature 98 F (36.7 C), resp. rate 19, height 5\' 8"  (1.727 m), weight 55.4 kg, SpO2 98 %. Constitutional: No distress . Vital signs reviewed. HEENT: EOMI, oral membranes moist Neck: supple, trach stoma with hypergranulation tissue Cardiovascular: RRR without murmur. No JVD    Respiratory: CTA Bilaterally without wheezes or rales. Normal effort    GI: BS +, non-tender, non-distended  Musculoskeletal:     Comments: No edema or tenderness in extremities  Neurological: He is alert.  Oriented to self, month, place, person Motor: 4+/5 Dysphonia improving.  Skin: Skin is warm and dry. He is not diaphoretic.  Seborrheic dermatitis face unchanged Psychiatric: pleasant    Assessment/Plan: 1. Functional deficits secondary to debility/encephalopathy which require 3+ hours per day of interdisciplinary therapy in a comprehensive inpatient rehab setting.  Physiatrist is providing close team supervision and 24 hour management of active medical problems listed below.  Physiatrist and rehab team continue to assess barriers to discharge/monitor patient progress toward functional and medical goals  Care Tool:  Bathing  Bathing activity did not occur: Refused Body parts  bathed by patient: Right arm, Left arm, Chest, Abdomen, Front perineal area, Buttocks, Right upper leg, Left upper leg, Right lower leg, Left lower leg, Face         Bathing assist Assist Level: Supervision/Verbal cueing     Upper Body Dressing/Undressing Upper body dressing   What is the patient wearing?: Pull over shirt    Upper body assist Assist Level: Supervision/Verbal cueing    Lower Body Dressing/Undressing Lower body dressing      What is the patient wearing?: Pants, Underwear/pull up     Lower body assist Assist for lower body dressing: Supervision/Verbal cueing     Toileting Toileting Toileting Activity did not occur (Clothing management and hygiene only): Refused  Toileting assist Assist for toileting: Contact Guard/Touching assist     Transfers Chair/bed transfer  Transfers assist  Chair/bed transfer activity did not occur: Safety/medical concerns  Chair/bed transfer assist level: Contact Guard/Touching assist     Locomotion Ambulation   Ambulation assist      Assist level: Contact Guard/Touching assist Assistive device: Cane-straight Max distance: 144ft   Walk 10 feet activity   Assist     Assist level: Contact Guard/Touching assist Assistive device: Cane-straight   Walk 50 feet activity   Assist    Assist level: Contact Guard/Touching assist Assistive device: Cane-straight    Walk 150 feet activity   Assist    Assist level: Contact Guard/Touching assist Assistive device: Cane-straight    Walk 10 feet on uneven surface  activity   Assist Walk 10 feet on uneven surfaces activity did not occur: Armed forces logistics/support/administrative officer  Assist Will patient use wheelchair at discharge?: No             Wheelchair 50 feet with 2 turns activity    Assist            Wheelchair 150 feet activity     Assist          Medical Problem List and Plan: 1.  Mobility deficits, limitations in endurance,  limitation self-care, dysphagia secondary to debility.             -Continue CIR therapies including PT, OT, and SLP  2.  Antithrombotics: -DVT/anticoagulation:  Pharmaceutical: Other (comment)--Eliquis             -antiplatelet therapy: N/A 3. Pain Management: Oxycodone prn 4. Mood: LCSW to follow for evaluation and support.              -antipsychotic agents: Klonopin, and Seroquel.  Plan to wean medications as tolerated but not today 5. Neuropsych: This patient is not fully capable of making decisions on his own behalf. 6. Skin/Wound Care: Routine pressure relief measures. Continue prostat.  7. Fluids/Electrolytes/Nutrition: encourage PO   -protein supp for low albumin   -Intake improving 8. COPD/hx of lung nodules: Continue duonebs prn. Was on symbicort in the past (2 years ago?).  Monitor respiratory rate with increased exertion.  -decannulated without issue  -applied silver nitrate to granulation tissue again today 9. HTN: metoprolol 50mg  q8 hours Vitals:   06/08/18 0610 06/08/18 0729  BP: 133/66   Pulse: 67   Resp: 19   Temp: 98 F (36.7 C)   SpO2: 98% 98%  controlled 5/1 10. Delirium: improved 11.  Anemia : Continue iron supplement.   -hgb 8.3 4/28  -no clinical signs of bleeding at present  - hgb has ranged from 8.2 to 9.5  12. Dysphagia:  On dysphagia 2, nectars.   -eating and drinking fairly well at this point  -esophagram on 03/28/16 revealed spasm, has hx of GERD  -continue protonix 25meq bid  -continue imdur 15mg  daily.  LOS:   9 days A FACE TO [place] 06/08/2018, 10:49 AM

## 2018-06-09 ENCOUNTER — Inpatient Hospital Stay (HOSPITAL_COMMUNITY): Payer: BLUE CROSS/BLUE SHIELD | Admitting: Speech Pathology

## 2018-06-09 ENCOUNTER — Inpatient Hospital Stay (HOSPITAL_COMMUNITY): Payer: BLUE CROSS/BLUE SHIELD

## 2018-06-09 DIAGNOSIS — R451 Restlessness and agitation: Secondary | ICD-10-CM

## 2018-06-09 DIAGNOSIS — D62 Acute posthemorrhagic anemia: Secondary | ICD-10-CM

## 2018-06-09 DIAGNOSIS — R339 Retention of urine, unspecified: Secondary | ICD-10-CM

## 2018-06-09 DIAGNOSIS — R0989 Other specified symptoms and signs involving the circulatory and respiratory systems: Secondary | ICD-10-CM

## 2018-06-09 DIAGNOSIS — R1310 Dysphagia, unspecified: Secondary | ICD-10-CM

## 2018-06-09 MED ORDER — BETHANECHOL CHLORIDE 10 MG PO TABS
10.0000 mg | ORAL_TABLET | Freq: Three times a day (TID) | ORAL | Status: DC
  Administered 2018-06-09 – 2018-06-10 (×3): 10 mg via ORAL
  Filled 2018-06-09 (×3): qty 1

## 2018-06-09 MED ORDER — TAMSULOSIN HCL 0.4 MG PO CAPS
0.8000 mg | ORAL_CAPSULE | Freq: Every day | ORAL | Status: DC
  Administered 2018-06-09 – 2018-06-10 (×2): 0.8 mg via ORAL
  Filled 2018-06-09 (×2): qty 2

## 2018-06-09 MED ORDER — QUETIAPINE FUMARATE 25 MG PO TABS
25.0000 mg | ORAL_TABLET | Freq: Two times a day (BID) | ORAL | Status: DC
  Administered 2018-06-09 – 2018-06-11 (×4): 25 mg via ORAL
  Filled 2018-06-09 (×4): qty 1

## 2018-06-09 NOTE — Progress Notes (Signed)
Speech Language Pathology Daily Session Note  Patient Details  Name: Kyle Lowery MRN: [id number] Date of Birth: 1950/07/20  Today's Date: 06/09/2018 SLP Individual Time: 1200-1225 SLP Individual Time Calculation (min): 25 min  Short Term Goals: Week 2: SLP Short Term Goal 1 (Week 2): Patient will consume current diet with minimal overt s/s of aspiration with Mod I.  SLP Short Term Goal 2 (Week 2): Patient will demonstrate selective attention to tasks in a mildly distracting enviornment for 30 minutes with supervision verbal cues for redirection.  SLP Short Term Goal 3 (Week 2): Patient will utilize speech intelligibility strategies at the sentence level to achieve 90% intelligibility with supervision verbal cues.  SLP Short Term Goal 4 (Week 2): Patient will orient to time, place and situation with Mod I.  SLP Short Term Goal 5 (Week 2): Patient will self-monitor and correct errors during functional tasks with supervision verbal cues.   Skilled Therapeutic Interventions: Skilled treatment session focused on cognitive goals. Upon arrival, patient requested to use the bathroom and ambulated to the commode with supervision. Patient reported difficulty having a bowel movement and SLP advised him not to strain. Patient was unsuccessful and agreeable to get up and try again later. When patient stood, patient reported dizziness and required Min A for balance. Patient ambulated back to bed and appeared pale and diaphoretic. RN aware and vitals taken with all of them [facility]. Patient declined his lunch tray and requested to rest due to fatigue and pain. Patient left in bed with alarm on and all needs within reach. Continue with current plan of care.      Pain Stomach and buttock pain, RN aware and patient repositioned.   Therapy/Group: Individual Therapy  Gracemarie Skeet 06/09/2018, 2:37 PM

## 2018-06-09 NOTE — Progress Notes (Signed)
Occupational Therapy Session Note  Patient Details  Name: Kyle Lowery MRN: [id number] Date of Birth: [birth year]  Today's Date: 06/09/2018 OT Individual Time: 0174-9449 OT Individual Time Calculation (min): 41 min    Short Term Goals: Week 1:  OT Short Term Goal 1 (Week 1): Pt will perform LB clothing management after toileting with min guard for balance.  OT Short Term Goal 2 (Week 1): Pt will perform UB clothing management with min A. OT Short Term Goal 3 (Week 1): Pt will perform LB dressing with mod A.   Skilled Therapeutic Interventions/Progress Updates:     1;1. Pt greeted in bed agreeable to OT reporting back pain but better than yesterday. PT wanting to brush teeth seated with setup. Pt ambulates to gym with sueprvision. OT instructs pt through various yoga poses for posterior chain stretches with tactile cues for proper alignment. Poses pyramid, seated sun salutation variation, supine knee to chest, supine twist, and reclined half pigeon. Pt given heat pack at end of session for continued pain relief of back.  Exited session with pt seated in bed, exit alarm on and call light in reach  Therapy Documentation Precautions:  Precautions Precautions: Fall Precaution Comments: Recently decannulated, stoma occluded Restrictions Weight Bearing Restrictions: No General:   Vital Signs: Therapy Vitals Temp: (!) 97.5 F (36.4 C) Temp Source: Oral Pulse Rate: 67 Resp: 19 BP: 111/63 Patient Position (if appropriate): Lying Oxygen Therapy SpO2: 99 % O2 Device: Room Air Pain:   ADL:   Vision   Perception    Praxis   Exercises:   Other Treatments:     Therapy/Group: Individual Therapy  Tonny Branch 06/09/2018, 7:47 AM

## 2018-06-09 NOTE — Progress Notes (Signed)
[place] PHYSICAL MEDICINE & REHABILITATION PROGRESS NOTE   Subjective/Complaints: Patient seen laying in bed this morning.  He states he slept fairly overnight due to issues with urination.  He has questions regarding urinary retention.  ROS: + Urinary retention.  Denies CP, shortness of breath, nausea, vomiting, diarrhea.  Objective:   No results found. No results for input(s): WBC, HGB, HCT, PLT in the last 72 hours. No results for input(s): NA, K, CL, CO2, GLUCOSE, BUN, CREATININE, CALCIUM in the last 72 hours.  Intake/Output Summary (Last 24 hours) at 06/09/2018 1207 Last data filed at 06/09/2018 7867 Gross per 24 hour  Intake 600 ml  Output 2325 ml  Net -1725 ml     Physical Exam: Vital Signs Blood pressure 111/63, pulse 67, temperature (!) 97.5 F (36.4 C), temperature source Oral, resp. rate 19, height 5\' 8"  (1.727 m), weight 56.3 kg, SpO2 99 %. Constitutional: No distress . Vital signs reviewed. HENT: Normocephalic.  Atraumatic. Eyes: EOMI. No discharge. Cardiovascular: No JVD. Respiratory: Normal effort. GI: Non-distended. Musc: No edema or tenderness in extremities. Neurological: He is alert.  Motor: Grossly 4+/5 throughout  Dysphonia improving Skin: Skin is warm and dry.  Intact. Psychiatric: pleasant   Assessment/Plan: 1. Functional deficits secondary to debility/encephalopathy which require 3+ hours per day of interdisciplinary therapy in a comprehensive inpatient rehab setting.  Physiatrist is providing close team supervision and 24 hour management of active medical problems listed below.  Physiatrist and rehab team continue to assess barriers to discharge/monitor patient progress toward functional and medical goals  Care Tool:  Bathing  Bathing activity did not occur: Refused Body parts bathed by patient: Right arm, Left arm, Chest, Abdomen, Front perineal area, Buttocks, Right upper leg, Left upper leg, Right lower leg, Left lower leg, Face         Bathing assist Assist Level: Supervision/Verbal cueing     Upper Body Dressing/Undressing Upper body dressing   What is the patient wearing?: Pull over shirt    Upper body assist Assist Level: Supervision/Verbal cueing    Lower Body Dressing/Undressing Lower body dressing      What is the patient wearing?: Pants, Underwear/pull up     Lower body assist Assist for lower body dressing: Supervision/Verbal cueing     Toileting Toileting Toileting Activity did not occur (Clothing management and hygiene only): Refused  Toileting assist Assist for toileting: Supervision/Verbal cueing     Transfers Chair/bed transfer  Transfers assist  Chair/bed transfer activity did not occur: Safety/medical concerns  Chair/bed transfer assist level: Contact Guard/Touching assist     Locomotion Ambulation   Ambulation assist      Assist level: Contact Guard/Touching assist Assistive device: Cane-straight Max distance: 154ft   Walk 10 feet activity   Assist     Assist level: Contact Guard/Touching assist Assistive device: Cane-straight   Walk 50 feet activity   Assist    Assist level: Contact Guard/Touching assist Assistive device: Cane-straight    Walk 150 feet activity   Assist    Assist level: Contact Guard/Touching assist Assistive device: Cane-straight    Walk 10 feet on uneven surface  activity   Assist Walk 10 feet on uneven surfaces activity did not occur: Refused         Wheelchair     Assist Will patient use wheelchair at discharge?: No             Wheelchair 50 feet with 2 turns activity    Assist  Wheelchair 150 feet activity     Assist          Medical Problem List and Plan: 1.  Mobility deficits, limitations in endurance, limitation self-care, dysphagia secondary to debility.  Continue CIR 2.  Antithrombotics: -DVT/anticoagulation:  Pharmaceutical: Other (comment)--Eliquis              -antiplatelet therapy: N/A 3. Pain Management: Oxycodone prn 4. Mood: LCSW to follow for evaluation and support.              -antipsychotic agents: Cont Klonopin  Seroquel decreased to 25 twice daily on 5/2 5. Neuropsych: This patient is not fully capable of making decisions on his own behalf. 6. Skin/Wound Care: Routine pressure relief measures. Continue prostat.  7. Fluids/Electrolytes/Nutrition: encourage PO   -protein supp for low albumin 8. COPD/hx of lung nodules: Continue duonebs prn. Was on symbicort in the past (2 years ago?).  Monitor respiratory rate with increased exertion.  -decannulated without issue  -applied silver nitrate to granulation tissue again today 9. HTN: metoprolol 50mg  q8 hours Vitals:   06/08/18 1954 06/09/18 0513  BP: (!) 116/51 111/63  Pulse: 67 67  Resp: 17 19  Temp: 98 F (36.7 C) (!) 97.5 F (36.4 C)  SpO2: 98% 99%   Labile on 5/2 10. Delirium: improved 11.  Anemia : Continue iron supplement.   Hemoglobin 8.3 on 4/27, labs ordered for Monday  -no clinical signs of bleeding at present 12. Dysphagia: Advanced to D3 thins, continue to advance as tolerated  -esophagram on 03/28/16 revealed spasm, has hx of GERD  -continue protonix 48meq bid 13.  Urinary retention  Flomax increased on 5/2  Urecholine 10 3 times daily started on 5/2  LOS: 10 days A FACE TO FACE EVALUATION WAS PERFORMED  Kyle Lowery [place] 06/09/2018, 12:07 PM

## 2018-06-10 ENCOUNTER — Inpatient Hospital Stay (HOSPITAL_COMMUNITY): Payer: BLUE CROSS/BLUE SHIELD

## 2018-06-10 MED ORDER — BETHANECHOL CHLORIDE 25 MG PO TABS
25.0000 mg | ORAL_TABLET | Freq: Three times a day (TID) | ORAL | Status: DC
  Administered 2018-06-10 – 2018-06-11 (×4): 25 mg via ORAL
  Filled 2018-06-10 (×4): qty 1

## 2018-06-10 NOTE — Progress Notes (Signed)
D/C target date is for 05/05 per notes, family education to take place on Monday 05/04 @ 10am.  Have discussed with pt the need for continued I/O cath. Pt reports that has 3 family members that are nurses and will not be an issue. I will see if I can discuss with wife to confirm so that I/O will not be an issue for going home.

## 2018-06-10 NOTE — Progress Notes (Signed)
[place] PHYSICAL MEDICINE & REHABILITATION PROGRESS NOTE   Subjective/Complaints: Patient seen laying in bed this morning.  He states he slept well overnight, except for an episode of incontinence.  He does not remember me.  ROS: + Urinary retention.  Denies CP, shortness of breath, nausea, vomiting, diarrhea.  Objective:   No results found. No results for input(s): WBC, HGB, HCT, PLT in the last 72 hours. No results for input(s): NA, K, CL, CO2, GLUCOSE, BUN, CREATININE, CALCIUM in the last 72 hours.  Intake/Output Summary (Last 24 hours) at 06/10/2018 1147 Last data filed at 06/10/2018 1045 Gross per 24 hour  Intake 480 ml  Output 2202 ml  Net -1722 ml     Physical Exam: Vital Signs Blood pressure (!) 121/49, pulse 76, temperature 98.3 F (36.8 C), temperature source Oral, resp. rate 14, height 5\' 8"  (1.727 m), weight 55.8 kg, SpO2 100 %. Constitutional: No distress . Vital signs reviewed. HENT: Normocephalic.  Atraumatic. Eyes: EOMI.  No discharge. Cardiovascular: No JVD. Respiratory: Normal effort. GI: Non-distended. Musc: No edema or tenderness in extremities. Neurological: He is alert.  Motor: Grossly 4+/5 throughout, unchanged Dysphonia improving Skin: Skin is warm and dry.  Intact. Psychiatric: pleasant   Assessment/Plan: 1. Functional deficits secondary to debility/encephalopathy which require 3+ hours per day of interdisciplinary therapy in a comprehensive inpatient rehab setting.  Physiatrist is providing close team supervision and 24 hour management of active medical problems listed below.  Physiatrist and rehab team continue to assess barriers to discharge/monitor patient progress toward functional and medical goals  Care Tool:  Bathing  Bathing activity did not occur: Refused Body parts bathed by patient: Right arm, Left arm, Chest, Abdomen, Front perineal area, Buttocks, Right upper leg, Left upper leg, Right lower leg, Left lower leg, Face          Bathing assist Assist Level: Supervision/Verbal cueing     Upper Body Dressing/Undressing Upper body dressing   What is the patient wearing?: Pull over shirt    Upper body assist Assist Level: Supervision/Verbal cueing    Lower Body Dressing/Undressing Lower body dressing      What is the patient wearing?: Pants, Underwear/pull up     Lower body assist Assist for lower body dressing: Supervision/Verbal cueing     Toileting Toileting Toileting Activity did not occur (Clothing management and hygiene only): Refused  Toileting assist Assist for toileting: Supervision/Verbal cueing     Transfers Chair/bed transfer  Transfers assist  Chair/bed transfer activity did not occur: Safety/medical concerns  Chair/bed transfer assist level: Supervision/Verbal cueing     Locomotion Ambulation   Ambulation assist      Assist level: Contact Guard/Touching assist Assistive device: Cane-straight Max distance: 193ft   Walk 10 feet activity   Assist     Assist level: Contact Guard/Touching assist Assistive device: Cane-straight   Walk 50 feet activity   Assist    Assist level: Contact Guard/Touching assist Assistive device: Cane-straight    Walk 150 feet activity   Assist    Assist level: Contact Guard/Touching assist Assistive device: Cane-straight    Walk 10 feet on uneven surface  activity   Assist Walk 10 feet on uneven surfaces activity did not occur: Refused         Wheelchair     Assist Will patient use wheelchair at discharge?: No             Wheelchair 50 feet with 2 turns activity    Assist  Wheelchair 150 feet activity     Assist          Medical Problem List and Plan: 1.  Mobility deficits, limitations in endurance, limitation self-care, dysphagia secondary to debility.  Continue CIR 2.  Antithrombotics: -DVT/anticoagulation:  Pharmaceutical: Other (comment)--Eliquis              -antiplatelet therapy: N/A 3. Pain Management: Oxycodone prn 4. Mood: LCSW to follow for evaluation and support.              -antipsychotic agents: Cont Klonopin  Seroquel decreased to 25 twice daily on 5/2, consider further decrease tomorrow 5. Neuropsych: This patient is not fully capable of making decisions on his own behalf. 6. Skin/Wound Care: Routine pressure relief measures. Continue prostat.  7. Fluids/Electrolytes/Nutrition: encourage PO   -protein supp for low albumin 8. COPD/hx of lung nodules: Continue duonebs prn. Was on symbicort in the past (2 years ago?).  Monitor respiratory rate with increased exertion.  -decannulated without issue  -applied silver nitrate to granulation tissue again today 9. HTN: metoprolol 50mg  q8 hours Vitals:   06/10/18 0421 06/10/18 0719  BP: (!) 121/49   Pulse: 76   Resp: 14   Temp: 98.3 F (36.8 C)   SpO2: 99% 100%   Relatively controlled on 5/3 10. Delirium: improved 11.  Anemia : Continue iron supplement.   Hemoglobin 8.3 on 4/27, labs ordered for tomorrow  -no clinical signs of bleeding at present 12. Dysphagia: Advanced to D3 thins, continue to advance as tolerated  -esophagram on 03/28/16 revealed spasm, has hx of GERD  -continue protonix 78meq bid 13.  Urinary retention  Flomax increased on 5/2  Urecholine 10 3 times daily started on 5/2, increased on 5/3  LOS: 11 days A FACE TO FACE EVALUATION WAS PERFORMED  Kyle Lowery Lorie Phenix 06/10/2018, 11:47 AM

## 2018-06-10 NOTE — Progress Notes (Signed)
Talk to wife Baldo Ash on the phone. She is aware that pt not voiding on his own and needing to be cath'd. Asked about teaching said that would not be an issue that someone will be there that can cath and will not hinder discharge.

## 2018-06-10 NOTE — Progress Notes (Signed)
Occupational Therapy Session Note  Patient Details  Name: Kyle Lowery MRN: [id number] Date of Birth: [birth year]  Today's Date: 06/10/2018 OT Individual Time: 0037-0488 OT Individual Time Calculation (min): 65 min    Short Term Goals: Week 1:  OT Short Term Goal 1 (Week 1): Pt will perform LB clothing management after toileting with min guard for balance.  OT Short Term Goal 1 - Progress (Week 1): Met OT Short Term Goal 2 (Week 1): Pt will perform UB clothing management with min A. OT Short Term Goal 2 - Progress (Week 1): Met OT Short Term Goal 3 (Week 1): Pt will perform LB dressing with mod A.  OT Short Term Goal 3 - Progress (Week 1): Met  Skilled Therapeutic Interventions/Progress Updates:    Pt received supine, eager for breakfast. No c/o pain- surprised by lack of back pain- discussed pain management strategies at home. Pt set up to eat breakfast, with good recall of swallowing strategies. Pt completed functional mobility into bathroom with CGA, progressing to (S). Pt completed shower standing with close (S) throughout. Edu and cueing provided re impact of keeping eyes open to improve balance and use of grab bars. Pt dressed with good carryover of previous fall risk prevention edu- sitting to don pants. (S) overall for UB/LB dressing. Pt left sitting up in recliner with all needs met, chair alarm set.   Therapy Documentation Precautions:  Precautions Precautions: Fall Precaution Comments: Recently decannulated, stoma occluded Restrictions Weight Bearing Restrictions: No   Therapy/Group: Individual Therapy  Curtis Sites 06/10/2018, 7:52 AM

## 2018-06-10 NOTE — Progress Notes (Signed)
Physical Therapy Session Note  Patient Details  Name: Kyle Lowery MRN: [id number] Date of Birth: October 11, 1950  Today's Date: 06/10/2018 PT Individual Time: 1300-1400 PT Individual Time Calculation (min): 60 min   Short Term Goals:  Week 2:  PT Short Term Goal 1 (Week 2): STG = LTG due to ELOS.     Skilled Therapeutic Interventions/Progress Updates:  Pt resting in bed.  Superivison for supine> sit.  Sit>< stand with supervision.  Gait training without AD throughout unit, supervision, with mild weaving but no LOB; up/down ramp and over mulched area with supervision.  Simulated truck transfer with supervision.  Pt retrieved pen from floor with supervision.    Seated self -stretching for R/L hamstrings, x 2 minutes each.  Mod cues for technique.  Standing in center of floor clock, with 12, 3, 6, 9 numbers. Using R foot, pt tapped in a variety of sequences, with occasional LOB.  Given external perturbations, pt demonstrated bil ankle strategy, limited bil hip strategy, and bil stepping strategy, but had complete LOB backwards given minimal perturbations backwards.  Pt had to use toilet urgently for BM.  Gait to return to room as above.  Toilet transfer with supervision assist. Continent BM, no urination.  Hand washing at sink.    neuromuscular re-education via forced use for full wt shifting L><R, kicking a styrofoam cup with R/L foot x 60'.  Pt over-shifts to R when kicking with L foot, resulting in frequent LOB requiring min assist to regain.  Pt left resting in bed with alarm set and needs at hand.       Therapy Documentation Precautions:  Precautions Precautions: Fall Precaution Comments: Recently decannulated, stoma occluded Restrictions Weight Bearing Restrictions: No Therapy Vitals Temp: 98.2 F (36.8 C) Temp Source: Oral Pulse Rate: 70 Resp: 17 BP: 118/66 Oxygen Therapy SpO2: 99 % O2 Device: Room Air Pain: Pain Assessment Pain Scale: 0-10 Pain Score: 6  HA;  with PT urging, pt contacted RN to ask about pain meds       Therapy/Group: Individual Therapy  Oluwadamilola Rosamond 06/10/2018, 4:26 PM

## 2018-06-10 NOTE — Progress Notes (Signed)
Occupational Therapy Discharge Summary  Patient Details  Name: CORDARO MUKAI MRN: [id number] Date of Birth: 1950-07-08  Today's Date: 06/11/2018 OT Individual Time: 1115-1200 OT Individual Time Calculation (min): 45 min    Patient has met 15 of 15 long term goals due to improved activity tolerance, improved balance, postural control, ability to compensate for deficits, improved attention, improved awareness and improved coordination.  Patient to discharge at overall Supervision level.  Patient's care partner is independent to provide the necessary physical assistance at discharge.    Reasons goals not met: All treatment goals met  Recommendation:  Patient will benefit from ongoing skilled OT services in home health setting to continue to advance functional skills in the area of BADL.  Equipment: No equipment provided  Reasons for discharge: treatment goals met and discharge from hospital  Patient/family agrees with progress made and goals achieved: Yes   Skilled OT Intervention:  Family education session completed with pt's wife and daughter. Focus placed on fall prevention, safety awareness, and simple home modifications to ensure accessibility and safety. Pt completed 150 ft of functional mobility to IADL apt hand in hand with wife, no LOB. Stressed importance of (S) during showers and need for pt to be seated during LB bathing. Pt completed simulated walk in shower transfer with close (S). Pt completed floor transfer with (S), edu and demo provided re using furniture to assist transfer. Pt completed mobility back to room and HEP was provided with level one, yellow, theraband. Pt was left sitting EOB with family and nursing alerted to family ready for d/c.   OT Discharge Precautions/Restrictions  Precautions Precautions: Fall Precaution Comments: Recently decannulated, stoma occluded Restrictions Weight Bearing Restrictions: No Pain Pain Assessment Pain Scale: 0-10 Pain Score:  0-No pain ADL ADL Eating: Supervision/safety Where Assessed-Eating: Edge of bed Grooming: Supervision/safety Where Assessed-Grooming: Standing at sink Upper Body Bathing: Supervision/safety Where Assessed-Upper Body Bathing: Shower Lower Body Bathing: Supervision/safety Where Assessed-Lower Body Bathing: Shower Upper Body Dressing: Supervision/safety Where Assessed-Upper Body Dressing: Edge of bed Lower Body Dressing: Supervision/safety Where Assessed-Lower Body Dressing: Edge of bed Toileting: Supervision/safety Where Assessed-Toileting: Glass blower/designer: Close supervision Armed forces technical officer Method: Counselling psychologist: Child psychotherapist: Energy manager: Close supervision Social research officer, government Method: Heritage manager: Grab bars Vision Baseline Vision/History: Wears glasses Wears Glasses: At all times Patient Visual Report: No change from baseline Vision Assessment?: No apparent visual deficits Perception  Perception: Within Functional Limits Praxis Praxis: Intact Cognition Overall Cognitive Status: Impaired/Different from baseline Arousal/Alertness: Awake/alert Orientation Level: Oriented X4 Attention: Sustained Sustained Attention: Appears intact Memory: Appears intact Awareness: Appears intact Awareness Impairment: Emergent impairment Problem Solving: Appears intact Safety/Judgment: Appears intact Sensation Sensation Light Touch: Appears Intact Hot/Cold: Appears Intact Proprioception: Appears Intact Coordination Gross Motor Movements are Fluid and Coordinated: Yes Fine Motor Movements are Fluid and Coordinated: Yes Finger Nose Finger Test: [facility] Motor  Motor Motor: Other (comment) Motor - Discharge Observations: generalized deconditioning  Mobility  Bed Mobility Bed Mobility: Supine to Sit Supine to Sit: Supervision/Verbal cueing Transfers Sit to Stand: Supervision/Verbal  cueing Stand to Sit: Supervision/Verbal cueing  Trunk/Postural Assessment  Cervical Assessment Cervical Assessment: Exceptions to WFL(forward head) Thoracic Assessment Thoracic Assessment: Exceptions to WFL(rounded shoulders) Lumbar Assessment Lumbar Assessment: Exceptions to WFL(posterior pelvic tilt) Postural Control Postural Control: Deficits on evaluation Righting Reactions: delayed  Balance Balance Balance Assessed: Yes Static Sitting Balance Static Sitting - Balance Support: Feet supported;Bilateral upper extremity supported Static Sitting - Level of Assistance:  5: Stand by assistance Dynamic Sitting Balance Dynamic Sitting - Balance Support: Feet supported;Bilateral upper extremity supported Dynamic Sitting - Level of Assistance: 5: Stand by assistance Dynamic Sitting - Balance Activities: Trunk control activities Static Standing Balance Static Standing - Balance Support: During functional activity Static Standing - Level of Assistance: 5: Stand by assistance Dynamic Standing Balance Dynamic Standing - Balance Support: During functional activity;No upper extremity supported Dynamic Standing - Level of Assistance: 5: Stand by assistance Dynamic Standing - Comments: during shower Extremity/Trunk Assessment RUE Assessment RUE Assessment: Exceptions to [facility] General Strength Comments: 4/5 generalized weakness, likely back to baseline LUE Assessment LUE Assessment: Exceptions to [facility] General Strength Comments: 4/5 generalized weakness, likely back to baseline   Curtis Sites 06/10/2018, 8:01 AM

## 2018-06-11 ENCOUNTER — Encounter (HOSPITAL_COMMUNITY): Payer: BLUE CROSS/BLUE SHIELD

## 2018-06-11 ENCOUNTER — Inpatient Hospital Stay (HOSPITAL_COMMUNITY): Payer: BLUE CROSS/BLUE SHIELD | Admitting: Physical Therapy

## 2018-06-11 ENCOUNTER — Inpatient Hospital Stay (HOSPITAL_COMMUNITY): Payer: BLUE CROSS/BLUE SHIELD

## 2018-06-11 LAB — CBC WITH DIFFERENTIAL/PLATELET
Abs Immature Granulocytes: 0.03 10*3/uL (ref 0.00–0.07)
Basophils Absolute: 0.1 10*3/uL (ref 0.0–0.1)
Basophils Relative: 1 %
Eosinophils Absolute: 0.3 10*3/uL (ref 0.0–0.5)
Eosinophils Relative: 4 %
HCT: 29.8 % — ABNORMAL LOW (ref 39.0–52.0)
Hemoglobin: 9.6 g/dL — ABNORMAL LOW (ref 13.0–17.0)
Immature Granulocytes: 1 %
Lymphocytes Relative: 36 %
Lymphs Abs: 2.4 10*3/uL (ref 0.7–4.0)
MCH: 33.2 pg (ref 26.0–34.0)
MCHC: 32.2 g/dL (ref 30.0–36.0)
MCV: 103.1 fL — ABNORMAL HIGH (ref 80.0–100.0)
Monocytes Absolute: 0.7 10*3/uL (ref 0.1–1.0)
Monocytes Relative: 10 %
Neutro Abs: 3.1 10*3/uL (ref 1.7–7.7)
Neutrophils Relative %: 48 %
Platelets: 374 10*3/uL (ref 150–400)
RBC: 2.89 MIL/uL — ABNORMAL LOW (ref 4.22–5.81)
RDW: 15.6 % — ABNORMAL HIGH (ref 11.5–15.5)
WBC: 6.5 10*3/uL (ref 4.0–10.5)
nRBC: 0 % (ref 0.0–0.2)

## 2018-06-11 LAB — BASIC METABOLIC PANEL
Anion gap: 7 (ref 5–15)
BUN: 19 mg/dL (ref 8–23)
CO2: 25 mmol/L (ref 22–32)
Calcium: 8.9 mg/dL (ref 8.9–10.3)
Chloride: 105 mmol/L (ref 98–111)
Creatinine, Ser: 1.04 mg/dL (ref 0.61–1.24)
GFR calc Af Amer: >60 mL/min (ref >60)
GFR calc non Af Amer: >60 mL/min (ref >60)
Glucose, Bld: 110 mg/dL — ABNORMAL HIGH (ref 70–99)
Potassium: 3.9 mmol/L (ref 3.5–5.1)
Sodium: 137 mmol/L (ref 135–145)

## 2018-06-11 LAB — URINALYSIS, COMPLETE (UACMP) WITH MICROSCOPIC
Bilirubin Urine: NEGATIVE
Glucose, UA: NEGATIVE mg/dL
Hgb urine dipstick: NEGATIVE
Ketones, ur: NEGATIVE mg/dL
Leukocytes,Ua: NEGATIVE
Nitrite: NEGATIVE
Protein, ur: NEGATIVE mg/dL
Specific Gravity, Urine: 1.008 (ref 1.005–1.030)
pH: 6 (ref 5.0–8.0)

## 2018-06-11 MED ORDER — DILTIAZEM HCL 90 MG PO TABS: 90.0000 mg | ORAL_TABLET | Freq: Three times a day (TID) | ORAL | 1 refills | Status: DC

## 2018-06-11 MED ORDER — SELENIUM SULFIDE 1 % EX LOTN: 1.0000 "application " | TOPICAL_LOTION | Freq: Every day | CUTANEOUS | Status: DC

## 2018-06-11 MED ORDER — TRAMADOL HCL 50 MG PO TABS: 50.0000 mg | ORAL_TABLET | Freq: Four times a day (QID) | ORAL | 0 refills | Status: DC | PRN

## 2018-06-11 MED ORDER — TAMSULOSIN HCL 0.4 MG PO CAPS: 0.8000 mg | ORAL_CAPSULE | Freq: Every day | ORAL | 1 refills | Status: DC

## 2018-06-11 MED ORDER — FE FUMARATE-B12-VIT C-FA-IFC PO CAPS: 1.0000 | ORAL_CAPSULE | Freq: Two times a day (BID) | ORAL | 1 refills | Status: AC

## 2018-06-11 MED ORDER — GUAIFENESIN 200 MG PO TABS: 200.0000 mg | ORAL_TABLET | Freq: Four times a day (QID) | ORAL | 0 refills | Status: DC

## 2018-06-11 MED ORDER — FAMOTIDINE 20 MG PO TABS: 20.0000 mg | ORAL_TABLET | Freq: Every day | ORAL | 1 refills | Status: DC

## 2018-06-11 MED ORDER — FOLIC ACID 1 MG PO TABS: 1.0000 mg | ORAL_TABLET | Freq: Every day | ORAL | 1 refills | Status: AC

## 2018-06-11 MED ORDER — METOPROLOL TARTRATE 50 MG PO TABS: 50.0000 mg | ORAL_TABLET | Freq: Three times a day (TID) | ORAL | 1 refills | Status: DC

## 2018-06-11 MED ORDER — PAROXETINE HCL 20 MG PO TABS: 20.0000 mg | ORAL_TABLET | Freq: Every day | ORAL | 1 refills | Status: DC

## 2018-06-11 MED ORDER — HYDROCORTISONE 1 % EX CREA: TOPICAL_CREAM | Freq: Two times a day (BID) | CUTANEOUS | 0 refills | Status: DC

## 2018-06-11 MED ORDER — BUDESONIDE 0.5 MG/2ML IN SUSP: 0.5000 mg | Freq: Two times a day (BID) | RESPIRATORY_TRACT | 12 refills | Status: AC

## 2018-06-11 MED ORDER — CLONAZEPAM 0.5 MG PO TABS: 0.2500 mg | ORAL_TABLET | Freq: Three times a day (TID) | ORAL | Status: DC

## 2018-06-11 MED ORDER — ISOSORBIDE MONONITRATE ER 30 MG PO TB24: 15.0000 mg | ORAL_TABLET | Freq: Every day | ORAL | 1 refills | Status: AC

## 2018-06-11 MED ORDER — CLONAZEPAM 0.25 MG PO TBDP: 0.2500 mg | ORAL_TABLET | Freq: Three times a day (TID) | ORAL | 0 refills | Status: DC

## 2018-06-11 MED ORDER — CLONAZEPAM 0.25 MG PO TBDP
0.2500 mg | ORAL_TABLET | Freq: Three times a day (TID) | ORAL | Status: DC
  Administered 2018-06-11: 0.25 mg via ORAL
  Filled 2018-06-11: qty 1

## 2018-06-11 MED ORDER — MIRTAZAPINE 15 MG PO TABS: 15.0000 mg | ORAL_TABLET | Freq: Every day | ORAL | 1 refills | Status: DC

## 2018-06-11 MED ORDER — PANTOPRAZOLE SODIUM 40 MG PO TBEC: 40.0000 mg | DELAYED_RELEASE_TABLET | Freq: Two times a day (BID) | ORAL | 1 refills | Status: DC

## 2018-06-11 MED ORDER — MUSCLE RUB 10-15 % EX CREA: 1.0000 "application " | TOPICAL_CREAM | Freq: Two times a day (BID) | CUTANEOUS | 0 refills | Status: DC

## 2018-06-11 MED ORDER — QUETIAPINE FUMARATE 25 MG PO TABS: 25.0000 mg | ORAL_TABLET | Freq: Two times a day (BID) | ORAL | 0 refills | Status: DC

## 2018-06-11 MED ORDER — APIXABAN 5 MG PO TABS: 5.0000 mg | ORAL_TABLET | Freq: Two times a day (BID) | ORAL | 1 refills | Status: AC

## 2018-06-11 MED ORDER — BUDESONIDE 0.5 MG/2ML IN SUSP: 0.5000 mg | Freq: Two times a day (BID) | RESPIRATORY_TRACT | 12 refills | Status: DC

## 2018-06-11 MED ORDER — BETHANECHOL CHLORIDE 25 MG PO TABS: 25.0000 mg | ORAL_TABLET | Freq: Three times a day (TID) | ORAL | 1 refills | Status: DC

## 2018-06-11 NOTE — Discharge Summary (Signed)
Physician Discharge Summary  Patient ID: Kyle Lowery MRN: [id number] DOB/AGE: [birth year] 68 y.o.  Admit date: 05/30/2018 Discharge date: 06/11/2018  Discharge Diagnoses:  Principal Problem:   Debility Active Problems:   Essential hypertension, benign   COPD, moderate ([place])   Urinary retention   Agitation   Acute blood loss anemia   Discharged Condition: stable   Significant Diagnostic Studies: Dg Swallowing Func-speech Pathology  Result Date: 06/06/2018 Objective Swallowing Evaluation: Type of Study: MBS-Modified Barium Swallow Study  Patient Details Name: Kyle Lowery MRN: [id number] Date of Birth: 09-15-1950 Today's Date: 06/06/2018 Time: SLP Start Time (ACUTE ONLY): 0915 -SLP Stop Time (ACUTE ONLY): 0940 SLP Time Calculation (min) (ACUTE ONLY): 25 min Past Medical History: Past Medical History: Diagnosis Date . Acute on chronic respiratory failure with hypoxia ([place])  . Carotid artery stenosis  . COPD (chronic obstructive pulmonary disease) ([place])  . COPD, severe ([place])  . Coronary atherosclerosis of native coronary artery   Mild to moderate NOCAD with 80% ostial diagonal 1/12 . Delirium, acute  . Depression   Prior suicide attempt . Essential hypertension, benign  . GERD (gastroesophageal reflux disease)   Esophageal dilatation . Mixed hyperlipidemia  . Paroxysmal atrial fibrillation ([facility])  . Pneumonia due to Pseudomonas aeruginosa ([place])  . Syncope   Neurocardiogenic Past Surgical History: Past Surgical History: Procedure Laterality Date . ESOPHAGOGASTRODUODENOSCOPY (EGD) WITH ESOPHAGEAL DILATION   . INGUINAL HERNIA REPAIR   . LEFT HEART CATH    Unable to Stent Blockage . Left orchiectomy   . VASECTOMY   HPI: See H&P  No data recorded Assessment / Plan / Recommendation CHL IP CLINICAL IMPRESSIONS 06/06/2018 Clinical Impression Patient demonstrates a mild-moderate pharyngeal phase dysphagia characterized by incomplete laryngeal closure resulting in silent aspiration of thin liquids with large,  sequential sips via straw. Patient able to clear the majority of the aspirates with a cued cough. Aspiration was eliminated with small single, sips via straw with only intermittent trace silent penetration noted that cleared intermittently with a cued throat clear.  Recommend patient continued Dys. 2 textures due to missing dentition and upgrade to thin liquids via cup or straw with single sips and to avoid mixed consistencies. Also recommend patient continue full supervision to maximize safety. Patient educated on results and recommendations and verbalized understanding.  SLP Visit Diagnosis Dysphagia, pharyngeal phase (R13.13) Attention and concentration deficit following -- Frontal lobe and executive function deficit following -- Impact on safety and function Mild aspiration risk;Moderate aspiration risk   CHL IP TREATMENT RECOMMENDATION 06/06/2018 Treatment Recommendations Therapy as outlined in treatment plan below   Prognosis 06/06/2018 Prognosis for Safe Diet Advancement Good Barriers to Reach Goals Cognitive deficits Barriers/Prognosis Comment -- CHL IP DIET RECOMMENDATION 06/06/2018 SLP Diet Recommendations Dysphagia 3 (Mech soft) solids;Thin liquid Liquid Administration via Cup;Straw Medication Administration Whole meds with puree Compensations Minimize environmental distractions;Slow rate;Small sips/bites;Multiple dry swallows after each bite/sip;Clear throat intermittently Postural Changes --   CHL IP OTHER RECOMMENDATIONS 06/06/2018 Recommended Consults -- Oral Care Recommendations Oral care BID Other Recommendations --   CHL IP FOLLOW UP RECOMMENDATIONS 06/06/2018 Follow up Recommendations Home health SLP;24 hour supervision/assistance   CHL IP FREQUENCY AND DURATION 06/06/2018 Speech Therapy Frequency (ACUTE ONLY) min 3x week Treatment Duration 1 week      CHL IP ORAL PHASE 06/06/2018 Oral Phase Impaired Oral - Pudding Teaspoon -- Oral - Pudding Cup -- Oral - Honey Teaspoon -- Oral - Honey Cup -- Oral -  Nectar Teaspoon -- Oral - Nectar Cup [facility]  Oral - Nectar Straw -- Oral - Thin Teaspoon WFL Oral - Thin Cup WFL Oral - Thin Straw WFL Oral - Puree Piecemeal swallowing Oral - Mech Soft Impaired mastication;Piecemeal swallowing Oral - Regular -- Oral - Multi-Consistency -- Oral - Pill -- Oral Phase - Comment --  CHL IP PHARYNGEAL PHASE 06/06/2018 Pharyngeal Phase Impaired Pharyngeal- Pudding Teaspoon -- Pharyngeal -- Pharyngeal- Pudding Cup -- Pharyngeal -- Pharyngeal- Honey Teaspoon -- Pharyngeal -- Pharyngeal- Honey Cup -- Pharyngeal -- Pharyngeal- Nectar Teaspoon -- Pharyngeal -- Pharyngeal- Nectar Cup Delayed swallow initiation-vallecula Pharyngeal -- Pharyngeal- Nectar Straw -- Pharyngeal -- Pharyngeal- Thin Teaspoon WFL Pharyngeal -- Pharyngeal- Thin Cup Delayed swallow initiation-pyriform sinuses;Reduced airway/laryngeal closure;Penetration/Aspiration during swallow Pharyngeal Material does not enter airway;Material enters airway, CONTACTS cords and not ejected out;Material enters airway, remains ABOVE vocal cords then ejected out Pharyngeal- Thin Straw Delayed swallow initiation-pyriform sinuses;Penetration/Aspiration before swallow;Reduced airway/laryngeal closure Pharyngeal Material does not enter airway;Material enters airway, remains ABOVE vocal cords and not ejected out;Material enters airway, passes BELOW cords without attempt by patient to eject out (silent aspiration) Pharyngeal- Puree Delayed swallow initiation-vallecula Pharyngeal -- Pharyngeal- Mechanical Soft Delayed swallow initiation-vallecula Pharyngeal -- Pharyngeal- Regular -- Pharyngeal -- Pharyngeal- Multi-consistency -- Pharyngeal -- Pharyngeal- Pill -- Pharyngeal -- Pharyngeal Comment --  No flowsheet data found. Weston Anna, MA, CCC-SLP [phone] PAYNE, COURTNEY 06/06/2018, 10:06 AM               Labs:  Basic Metabolic Panel: BMP Latest Ref Rng & Units 06/11/2018 06/04/2018 05/31/2018  Glucose 70 - 99 mg/dL 110(H) 93 97  BUN 8 - 23 mg/dL  19 20 22   Creatinine 0.61 - 1.24 mg/dL 1.04 0.98 1.01  Sodium 135 - 145 mmol/L 137 138 142  Potassium 3.5 - 5.1 mmol/L 3.9 4.3 3.4(L)  Chloride 98 - 111 mmol/L 105 107 107  CO2 22 - 32 mmol/L 25 26 25   Calcium 8.9 - 10.3 mg/dL 8.9 8.5(L) 8.5(L)    CBC: CBC Latest Ref Rng & Units 06/11/2018 06/04/2018 05/31/2018  WBC 4.0 - 10.5 K/uL 6.5 6.8 7.9  Hemoglobin 13.0 - 17.0 g/dL 9.6(L) 8.3(L) 8.6(L)  Hematocrit 39.0 - 52.0 % 29.8(L) 26.2(L) 27.0(L)  Platelets 150 - 400 K/uL 374 253 298    CBG: No results for input(s): GLUCAP in the last 168 hours.  Brief HPI:   Kyle Lowery is a 68 year old male with history of CAD, severe COPD with ongoing tobacco use, depression who was admitted to [facility] on 04/22/18 due to Influenza A, H flu, pulmonary infiltrates, hypoxia and bouts of A fib with RVR. He required intubation and had difficulty with attempts at extubation requiring tracheostomy  therefore was transferred to [facility]. He continued to have issues with agitation due to delirium requiring Klonopin and Seroquel. He tolerated extubation with ATC and was advanced to dysphagia 3, nectars. His activity tolerance was improving and CIR was recommended due to functional decline.    Hospital Course: Kyle Lowery was admitted to rehab 05/30/2018 for inpatient therapies to consist of PT, ST and OT at least three hours five days a week. Past admission physiatrist, therapy team and rehab RN have worked together to provide customized collaborative inpatient rehab. His respiratory status has been stable and he was decannulated without difficulty. Blood pressures and heart rate has been controlled on metoprolol every 8 hours. Anxiety levels have improved and Seroquel is in process of being weaned off. He aw maintained on iron supplement for anemia. He continues on Eliquis and follow  up CBC showed that H/H and platelets are stable. He continues to have issues with urinary retention therefore flomax and urecholine were added  and titrated upwards. He continues to have problems voiding and has been educated on I/O caths 4-5 times a day to keep volumes < 350 cc.   He has made steady progress during his stay and is at supervision level. He will continue to receive follow up [place] [place] and [employer] by [facility] after discharge.    Rehab course: During patient's stay in rehab weekly team conferences were held to monitor patient's progress, set goals and discuss barriers to discharge. At admission, patient required min assist with mobility and min to max assist with basic self care tasks. He exhibited moderate to severe cognitive impairments with low vocal intensity and dysphagia requiring modified diet.  He  has had improvement in activity tolerance, balance, postural control as well as ability to compensate for deficits. He is able to complete ADL tasks with supervision. He requires supervision for transfers and to ambulate 150' without AD.   He is tolerating dysphagia 3, thin liquids with full supervision and family has been instructed on strict aspiration precautions.  He requires supervision due to cognitive deficits affecting attention, awareness of deficits with higher level processing.    Disposition:  Home  Diet: Soft.   Special Instructions: 1. Need to maintain strict aspiration precautions.  2. Family needs to assist with medication management.  3. Continue I/O caths 4-5 times a day to keep volumes < 350cc.   Discharge Instructions    Ambulatory referral to Physical Medicine Rehab   Complete by:  As directed    2-4 weeks transitional care appt   Ambulatory referral to Physical Medicine Rehab   Complete by:  As directed    1-2 weeks transitional care     Allergies as of 06/11/2018   No Known Allergies     Medication List    STOP taking these medications   bisacodyl 10 MG suppository Commonly known as:  DULCOLAX   ceFEPIme 2 g in sodium chloride 0.9 % 100 mL   clonazePAM 1 MG tablet Commonly  known as:  KLONOPIN Replaced by:  clonazePAM 0.25 MG disintegrating tablet   diltiazem 10 mg/ml  oral suspension Commonly known as:  CARDIZEM Replaced by:  diltiazem 90 MG tablet   docusate 50 MG/5ML liquid Commonly known as:  COLACE   feeding supplement (JEVITY 1.2 CAL) Liqd   feeding supplement (PRO-STAT SUGAR FREE 64) Liqd   ferrous sulfate 220 (44 Fe) MG/5ML solution   ipratropium-albuterol 0.5-2.5 (3) MG/3ML Soln Commonly known as:  DUONEB   metoprolol tartrate 25 mg/10 mL Susp Commonly known as:  LOPRESSOR Replaced by:  metoprolol tartrate 50 MG tablet   pantoprazole sodium 40 mg/20 mL Pack Commonly known as:  PROTONIX Replaced by:  pantoprazole 40 MG tablet   pravastatin 20 MG tablet Commonly known as:  PRAVACHOL   thiamine 100 MG tablet     TAKE these medications   acetaminophen 325 MG tablet Commonly known as:  TYLENOL Take 1-2 tablets (325-650 mg total) by mouth every 4 (four) hours as needed for mild pain.   albuterol (2.5 MG/3ML) 0.083% nebulizer solution Commonly known as:  PROVENTIL Take 3 mLs (2.5 mg total) by nebulization every 4 (four) hours as needed for wheezing or shortness of breath.   apixaban 5 MG Tabs tablet Commonly known as:  ELIQUIS Place 1 tablet (5 mg total) into feeding tube 2 (  two) times daily.   arformoterol 15 MCG/2ML Nebu Commonly known as:  BROVANA Take 2 mLs (15 mcg total) by nebulization 2 (two) times daily.   ascorbic acid 250 MG tablet Commonly known as:  VITAMIN C Place 1 tablet (250 mg total) into feeding tube 2 (two) times daily.   bethanechol 25 MG tablet Commonly known as:  URECHOLINE Take 1 tablet (25 mg total) by mouth 3 (three) times daily.   budesonide 0.5 MG/2ML nebulizer solution Commonly known as:  PULMICORT Take 2 mLs (0.5 mg total) by nebulization 2 (two) times daily.   clonazePAM 0.25 MG disintegrating tablet Commonly known as:  KLONOPIN Take 1 tablet (0.25 mg total) by mouth every 8 (eight)  hours. Replaces:  clonazePAM 1 MG tablet   diltiazem 90 MG tablet Commonly known as:  CARDIZEM Take 1 tablet (90 mg total) by mouth every 8 (eight) hours. Replaces:  diltiazem 10 mg/ml  oral suspension   famotidine 20 MG tablet Commonly known as:  PEPCID Take 1 tablet (20 mg total) by mouth daily.   ferrous [id number] C-folic acid capsule Commonly known as:  TRINSICON / FOLTRIN Take 1 capsule by mouth 2 (two) times daily before lunch and supper.   folic acid 1 MG tablet Commonly known as:  FOLVITE Take 1 tablet (1 mg total) by mouth daily. What changed:  how to take this   guaiFENesin 200 MG tablet Take 1 tablet (200 mg total) by mouth every 6 (six) hours.   hydrocortisone cream 1 % Apply topically 2 (two) times daily.   isosorbide mononitrate 30 MG 24 hr tablet Commonly known as:  IMDUR Take 0.5 tablets (15 mg total) by mouth daily.   metoprolol tartrate 50 MG tablet Commonly known as:  LOPRESSOR Take 1 tablet (50 mg total) by mouth every 8 (eight) hours. Replaces:  metoprolol tartrate 25 mg/10 mL Susp   mirtazapine 15 MG tablet Commonly known as:  REMERON Take 1 tablet (15 mg total) by mouth at bedtime.   Muscle Rub 10-15 % Crea Apply 1 application topically 2 (two) times daily after a meal.   pantoprazole 40 MG tablet Commonly known as:  PROTONIX Take 1 tablet (40 mg total) by mouth 2 (two) times daily. Replaces:  pantoprazole sodium 40 mg/20 mL Pack   PARoxetine 20 MG tablet Commonly known as:  PAXIL Take 1 tablet (20 mg total) by mouth daily. What changed:  how to take this   QUEtiapine 25 MG tablet Commonly known as:  SEROQUEL Take 1 tablet (25 mg total) by mouth 2 (two) times daily. What changed:    medication strength  how much to take  how to take this  when to take this   selenium sulfide 1 % Lotn Commonly known as:  SELSUN Apply 1 application topically daily.   tamsulosin 0.4 MG Caps capsule Commonly known as:  FLOMAX Take 2  capsules (0.8 mg total) by mouth daily after supper.   traMADol 50 MG tablet Commonly known as:  ULTRAM Take 1 tablet (50 mg total) by mouth every 6 (six) hours as needed for severe pain.      Follow-up Information    Meredith Staggers, MD Follow up.   Specialty:  Physical Medicine and Rehabilitation Why:  office will call you for follow up appointment Contact information: [address] [place] [address] [phone]        Rosita Fire, MD Follow up.   Specialty:  Internal Medicine Why:  the nurse  will contact you directly to set up a tele-visit Contact information: [place] [address] [phone]        Satira Sark, MD. Call.   Specialty:  Cardiology Why:  for follow up Contact information: [place] [address] [phone]           Signed: Bary Leriche 06/17/2018, 9:35 PM

## 2018-06-11 NOTE — Progress Notes (Signed)
Pt discharge instructions reviewed with pt by PA Pam and RN Hilda Blades. Pt and wife were taught self cathing by RN Hilda Blades. Pt given copy of d/c instructions and was discharged via wheelchair with belongings, with family and escorted by unit NT.

## 2018-06-11 NOTE — Progress Notes (Signed)
Speech Language Pathology Discharge Summary  Patient Details  Name: Kyle Lowery MRN: [id number] Date of Birth: December 13, 1950  Today's Date: 06/11/2018 SLP Individual Time: 0355-9741 SLP Individual Time Calculation (min): 30 min   Skilled Therapeutic Interventions:  Skilled ST services focused on education and swallow skills. SLP facilitated dys 3 trial, pt demonstrated appropriate oral clearance, no overt s/s aspiration and mod I for use of small sips with thin via straw. SLP upgraded pt to dys 3 diet. SLP facilitated education with family pertaining to swallow protocol and cognitive deficits. SLP provided handouts for swallowing strategies, diet recommendation and memory strategies. All questions answered to satisfaction. Pt was left in room with call bell within reach and family present. ST recommends to continue skilled ST services.     Patient has met   of 8 long term goals.  Patient to discharge at overall Supervision;Min;Modified Independent level.  Reasons goals not met:     Clinical Impression/Discharge Summary:    Pt made great progress meeting 8 out 8 goals discharging at min-mod I. SLP recommends dys 3 and thin liquid diet with full supervision to follow strict precautions, small sips with thin via cup/straw (MBS noted silent aspiration with large sips), no mixed consistencies, use of intermittent throat clear, upright/90 degree for all meals and medication (small pills whole with large pills crushed in puree.) SLP educated family to monitor newly upgraded diet from dys 2 to dys 3, as well as follow up with continued ST services.Pt is advised to cover healing stoma when coughing.  Pt continues to have deficits in cognitive skills pertaining to attention, emergent/anticipatory awareness and higher level problem solving. Education was completed retaining to swallow and cognitive deficits, as well as safety strategies. All questions answered to satisfaction. Pt would continue to benefit from  skilled ST services in order to maximize functional independence and reduce burden of care, requiring 24 hour supervision and continue ST services.  Care Partner:  Caregiver Able to Provide Assistance: Yes  Type of Caregiver Assistance: Physical;Cognitive  Recommendation:  24 hour supervision/assistance;Home Health SLP  Rationale for SLP Follow Up: Maximize cognitive function and independence;Maximize swallowing safety;Reduce caregiver burden   Equipment:   N/A  Reasons for discharge: Discharged from hospital   Patient/Family Agrees with Progress Made and Goals Achieved: Yes    Tristin Gladman 06/11/2018, 2:23 PM

## 2018-06-11 NOTE — Progress Notes (Signed)
[facility] PHYSICAL MEDICINE & REHABILITATION PROGRESS NOTE   Subjective/Complaints: No new complaints. Urine retention now an issue  ROS: Patient denies fever, rash, sore throat, blurred vision, nausea, vomiting, diarrhea, cough, shortness of breath or chest pain, joint or back pain, headache, or mood change.   Objective:   No results found. No results for input(s): WBC, HGB, HCT, PLT in the last 72 hours. Recent Labs    06/11/18 0613  NA 137  K 3.9  CL 105  CO2 25  GLUCOSE 110*  BUN 19  CREATININE 1.04  CALCIUM 8.9    Intake/Output Summary (Last 24 hours) at 06/11/2018 1001 Last data filed at 06/11/2018 0700 Gross per 24 hour  Intake 480 ml  Output 2200 ml  Net -1720 ml     Physical Exam: Vital Signs Blood pressure (!) 115/51, pulse 66, temperature 97.7 F (36.5 C), temperature source Oral, resp. rate 19, height 5\' 8"  (1.727 m), weight 55.6 kg, SpO2 99 %. Constitutional: No distress . Vital signs reviewed. HEENT: EOMI, oral membranes moist Neck: supple, trach stoma with smaller skin tag Cardiovascular: RRR without murmur. No JVD    Respiratory: CTA Bilaterally without wheezes or rales. Normal effort    GI: BS +, non-tender, non-distended  Musc: No edema or tenderness in extremities. Neurological: He is alert.  Motor: Grossly 4+/5 throughout, unchanged Dysphonia improving Skin: dermatitis face. Psychiatric: pleasant and cooperative   Assessment/Plan: 1. Functional deficits secondary to debility/encephalopathy which require 3+ hours per day of interdisciplinary therapy in a comprehensive inpatient rehab setting.  Physiatrist is providing close team supervision and 24 hour management of active medical problems listed below.  Physiatrist and rehab team continue to assess barriers to discharge/monitor patient progress toward functional and medical goals  Care Tool:  Bathing  Bathing activity did not occur: Refused Body parts bathed by patient: Right arm, Left  arm, Chest, Abdomen, Front perineal area, Buttocks, Right upper leg, Left upper leg, Right lower leg, Left lower leg, Face         Bathing assist Assist Level: Supervision/Verbal cueing     Upper Body Dressing/Undressing Upper body dressing   What is the patient wearing?: Pull over shirt    Upper body assist Assist Level: Supervision/Verbal cueing    Lower Body Dressing/Undressing Lower body dressing      What is the patient wearing?: Pants, Underwear/pull up     Lower body assist Assist for lower body dressing: Supervision/Verbal cueing     Toileting Toileting Toileting Activity did not occur (Clothing management and hygiene only): Refused  Toileting assist Assist for toileting: Supervision/Verbal cueing     Transfers Chair/bed transfer  Transfers assist  Chair/bed transfer activity did not occur: Safety/medical concerns  Chair/bed transfer assist level: Supervision/Verbal cueing     Locomotion Ambulation   Ambulation assist      Assist level: Supervision/Verbal cueing Assistive device: No Device Max distance: 150   Walk 10 feet activity   Assist     Assist level: Supervision/Verbal cueing Assistive device: No Device   Walk 50 feet activity   Assist    Assist level: Supervision/Verbal cueing Assistive device: No Device    Walk 150 feet activity   Assist    Assist level: Supervision/Verbal cueing Assistive device: No Device    Walk 10 feet on uneven surface  activity   Assist Walk 10 feet on uneven surfaces activity did not occur: Refused   Assist level: Supervision/Verbal cueing Assistive device: Other (comment)(no device)   Wheelchair  Assist Will patient use wheelchair at discharge?: No             Wheelchair 50 feet with 2 turns activity    Assist            Wheelchair 150 feet activity     Assist          Medical Problem List and Plan: 1.  Mobility deficits, limitations in endurance,  limitation self-care, dysphagia secondary to debility.  Continue CIR 2.  Antithrombotics: -DVT/anticoagulation:  Pharmaceutical: Other (comment)--Eliquis             -antiplatelet therapy: N/A 3. Pain Management: Oxycodone prn 4. Mood: LCSW to follow for evaluation and support.              -antipsychotic agents: Cont Klonopin  Seroquel decreased to 25 twice daily on 5/2, consider further decrease tomorrow 5. Neuropsych: This patient is not fully capable of making decisions on his own behalf. 6. Skin/Wound Care: Routine pressure relief measures. Continue prostat.  7. Fluids/Electrolytes/Nutrition: encourage PO   -protein supp for low albumin 8. COPD/hx of lung nodules: Continue duonebs prn. Was on symbicort in the past (2 years ago?).  Monitor respiratory rate with increased exertion.  -decannulated without issue  -apply silver nitrate to granulation tissue again today 9. HTN: metoprolol 50mg  q8 hours Vitals:   06/11/18 0542 06/11/18 0831  BP: (!) 115/51   Pulse: 66   Resp: 19   Temp: 97.7 F (36.5 C)   SpO2: 100% 99%   Relatively controlled on 5/4 10. Delirium: improved 11.  Anemia : Continue iron supplement.   Hemoglobin 8.3 on 4/27, labs pending  -no clinical signs of bleeding at present 12. Dysphagia: Advanced to D3 thins, continue to advance as tolerated  -esophagram on 03/28/16 revealed spasm, has hx of GERD  -continue protonix 27meq bid 13.  Urinary retention  Flomax increased on 5/2  Urecholine 10 3 times daily started on 5/2, increased on 5/3 to 25mg   -check urine culture/ua  LOS: 12 days A FACE TO FACE EVALUATION WAS PERFORMED  Meredith Staggers 06/11/2018, 10:01 AM

## 2018-06-11 NOTE — Progress Notes (Signed)
Physical Therapy Discharge Summary  Patient Details  Name: Kyle Lowery MRN: [id number] Date of Birth: 11-27-1950  Today's Date: 06/11/2018 PT Individual Time: 1004-1047 PT Individual Time Calculation (min): 43 min    Patient has met 7 of 8 long term goals due to improved activity tolerance, improved balance, improved postural control, increased strength, decreased pain, ability to compensate for deficits, improved attention, improved awareness and improved coordination.  Patient to discharge at an ambulatory level close supervision without AD.   Patient's care partner is independent to provide the necessary physical and cognitive assistance at discharge.  Reasons goals not met: impaired cognition/awareness  Recommendation:  Patient will benefit from ongoing skilled PT services in home health setting to continue to advance safe functional mobility, address ongoing impairments in balance, awareness, endurance, strength, gait, stair negotiation, and minimize fall risk.  Equipment: No equipment provided  Reasons for discharge: treatment goals met and discharge from hospital  Patient/family agrees with progress made and goals achieved: Yes  Skilled PT Treatment: Pt received in bed with family on phone & therapist providing them with directions to hospital parking deck. Pt agreeable to tx & completes bed mobility without rails & HOB flat with independence. Pt transfers to EOB and dons underwear & pants with close supervision for standing balance and cuing to sit to thread pants on BLE. Pt retrieves shoes in closet with supervision for gait without AD.  Pt ambulates room<>gym without AD & supervision with lateral sway but no LOB noted. Pt negotiates 12 steps with B rails (pt reports this is how his home entrance is set up) with supervision. Pt's family then arrives with therapist educating them on need for close supervision for all gait without AD, home modifications to ensure safety & reduce  tripping hazards (secure throw rugs, minimize distraction during gait), pt's ability to complete floor transfer without assistance & scenarios when they should call EMS if pt experiences a fall, pt's Berg score & fall risk/interpretation of score, pt's inability to drive, f/u HHPT, recommendation of 24 hr supervision, reviewed car transfer (sit vs stepping into car), and caregiver positioning to allow them to provide supervision for pt while he negotiates stairs. Pt then ambulates room<>gym with family member electing to hold his hand and pt negotiates 8 steps with B rails and caregiver electing to provide CGA. At end of session pt left sitting on EOB with family present to supervise, awaiting arrival of SLP. Pt & family deny any questions/concerns regarding d/c.   PT Discharge Precautions/Restrictions Precautions Precautions: Fall Precaution Comments: Recently decannulated, stoma occluded Restrictions Weight Bearing Restrictions: No  Pain Pt denies c/o pain.   Vision/Perception  Pt reports he wears glasses at all times. Pt denies changes in baseline vision. Pt does not have glasses with him in hospital.  Perception [facility]  Cognition Overall Cognitive Status: Impaired/Different from baseline Arousal/Alertness: Awake/alert Orientation Level: Oriented X4 Focused Attention: Appears intact Sustained Attention: Appears intact Memory Impairment: Decreased recall of new information Awareness: Impaired Awareness Impairment: Anticipatory impairment  Sensation Sensation & proprioception not tested, pt denies numbness/tingling in extremities.  Coordination Gross Motor Movements are Fluid and Coordinated: Yes Fine Motor Movements are Fluid and Coordinated: Yes  Motor  Motor Motor: Abnormal postural alignment and control Motor - Discharge Observations: generalized deconditioning    Mobility Bed Mobility Bed Mobility: Rolling Right;Supine to Sit;Rolling Left;Sit to Supine Rolling Right:  Independent Rolling Left: Independent Supine to Sit: Independent Sit to Supine: Independent Transfers Transfers: Sit to Stand;Stand to Sit Sit  to Stand: Supervision/Verbal cueing Stand to Sit: Supervision/Verbal cueing   Locomotion  Gait Ambulation: Yes Gait Assistance: Supervision/Verbal cueing Gait Distance (Feet): 150 Feet Assistive device: None Gait Gait: Yes Gait Pattern: Decreased step length - right;Decreased step length - left;Decreased stride length(increased lateral sway) Gait velocity: decreased Stairs / Additional Locomotion Stairs: Yes Stairs Assistance: Supervision/Verbal cueing Stair Management Technique: Two rails Number of Stairs: 12 Height of Stairs: 6(inches) Wheelchair Mobility Wheelchair Mobility: No   Trunk/Postural Assessment  Cervical Assessment Cervical Assessment: Exceptions to WFL(forward head) Thoracic Assessment Thoracic Assessment: Exceptions to WFL(rounded shoulders) Lumbar Assessment Lumbar Assessment: Exceptions to WFL(posterior pelvic tilt) Postural Control Postural Control: Deficits on evaluation Righting Reactions: delayed Protective Responses: deyaled   Balance Balance Balance Assessed: Yes Dynamic Standing Balance Dynamic Standing - Balance Support: During functional activity;No upper extremity supported Dynamic Standing - Level of Assistance: 5: Stand by assistance   Berg Balance Test on 06/05/18: 30/56  Extremity Assessment  All extremities WFL, strength not formally assessed.   Waunita Schooner 06/11/2018, 1:01 PM

## 2018-06-11 NOTE — Progress Notes (Signed)
Social Work  Discharge Note  The overall goal for the admission was met for:   Discharge location: Yes - home with wife and family able to provide 24/7 supervision  Length of Stay: Yes - 12 days  Discharge activity level: Yes - supervision  Home/community participation: Yes  Services provided included: MD, RD, PT, OT, SLP, RN, TR, Pharmacy and SW  Financial Services: Medicare and Private Insurance: [place] (primary)  Follow-up services arranged: Home Health: RN, PT, OT, ST via [facility] and Patient/Family has no preference for HH/DME agencies  Comments (or additional information):     Contact info:  Wife, [place], [place] [phone]  Patient/Family verbalized understanding of follow-up arrangements: Yes  Individual responsible for coordination of the follow-up plan: pt/ spouse  Confirmed correct DME delivered:  NA - none needed    Kyle Lowery

## 2018-06-13 ENCOUNTER — Telehealth: Payer: Self-pay | Admitting: Physician Assistant

## 2018-06-13 DIAGNOSIS — J9601 Acute respiratory failure with hypoxia: Secondary | ICD-10-CM | POA: Diagnosis not present

## 2018-06-13 DIAGNOSIS — I480 Paroxysmal atrial fibrillation: Secondary | ICD-10-CM | POA: Diagnosis not present

## 2018-06-13 DIAGNOSIS — J189 Pneumonia, unspecified organism: Secondary | ICD-10-CM | POA: Diagnosis not present

## 2018-06-13 DIAGNOSIS — J441 Chronic obstructive pulmonary disease with (acute) exacerbation: Secondary | ICD-10-CM | POA: Diagnosis not present

## 2018-06-13 LAB — URINE CULTURE: Culture: 20000 — AB

## 2018-06-13 NOTE — Telephone Encounter (Signed)
CONSENT OBTAINED ° ° °YOUR CARDIOLOGY TEAM HAS ARRANGED FOR AN E-VISIT FOR YOUR APPOINTMENT - PLEASE REVIEW IMPORTANT INFORMATION BELOW SEVERAL DAYS PRIOR TO YOUR APPOINTMENT ° °Due to the recent COVID-19 pandemic, we are transitioning in-person office visits to tele-medicine visits in an effort to decrease unnecessary exposure to our patients and staff. Medicare and most insurances are covering these visits without a copay needed. We also encourage you to sign up for MyChart if you have not already done so. You will need a smartphone if possible. For patients that do not have this, we can still complete the visit using a regular telephone but do prefer a smartphone to enable video when possible. You may have a close family member that lives with you that can help. If possible, we also ask that you have a blood pressure cuff and scale at home to measure your blood pressure, heart rate and weight prior to your scheduled appointment. Patients with clinical needs that need an in-person evaluation and testing will still be able to come to the office if absolutely necessary. If you have any questions, feel free to call our office. ° ° ° °IF YOU HAVE A SMARTPHONE, PLEASE DOWNLOAD THE WEBEX APP TO YOUR SMARTPHONE ° °- If Apple, go to App Store and type in WebEx in the search bar. Download [employer] Webex Meetings, the blue/green circle. The app is free but as with any other app download, your phone may require you to verify saved payment information or Apple password. You do NOT have to create a WebEx account. ° °- If Android, go to Google Play Store and type in WebEx in the search bar. Download [employer] Webex Meetings, the blue/green circle. The app is free but as with any other app download, your phone may require you to verify saved payment information or Android password. You do NOT have to create a WebEx account. ° °It is very helpful to have this downloaded before your visit. ° ° ° °2-3 DAYS BEFORE YOUR APPOINTMENT ° °You  will receive a telephone call from one of our [facility] team members - your caller ID may say "Unknown caller." If this is a video visit, we will confirm that you have been able to download the WebEx app. We will remind you check your blood pressure, heart rate and weight prior to your scheduled appointment. If you have an Apple Watch or Kardia, please upload any pertinent ECG strips the day before or morning of your appointment to MyChart. Our staff will also make sure you have reviewed the consent and agree to move forward with your scheduled tele-health visit.  ° ° ° °THE DAY OF YOUR APPOINTMENT ° °Approximately 15 minutes prior to your scheduled appointment, you will receive a telephone call from one of [facility] team - your caller ID may say "Unknown caller."  Our staff will confirm medications, vital signs for the day and any symptoms you may be experiencing. Please have this information available prior to the time of visit start. It may also be helpful for you to have a pad of paper and pen handy for any instructions given during your visit. They will also walk you through joining the WebEx smartphone meeting if this is a video visit. ° ° ° °CONSENT FOR TELE-HEALTH VISIT - PLEASE REVIEW ° °I hereby voluntarily request, consent and authorize [facility] and its employed or contracted physicians, physician assistants, nurse practitioners or other licensed health care professionals (the Practitioner), to provide me with telemedicine health care   services (the “Services") as deemed necessary by the treating Practitioner. I acknowledge and consent to receive the Services by the Practitioner via telemedicine. I understand that the telemedicine visit will involve communicating with the Practitioner through live audiovisual communication technology and the disclosure of certain medical information by electronic transmission. I acknowledge that I have been given the opportunity to request an in-person assessment or  other available alternative prior to the telemedicine visit and am voluntarily participating in the telemedicine visit. ° °I understand that I have the right to withhold or withdraw my consent to the use of telemedicine in the course of my care at any time, without affecting my right to future care or treatment, and that the Practitioner or I may terminate the telemedicine visit at any time. I understand that I have the right to inspect all information obtained and/or recorded in the course of the telemedicine visit and may receive copies of available information for a reasonable fee.  I understand that some of the potential risks of receiving the Services via telemedicine include:  °• Delay or interruption in medical evaluation due to technological equipment failure or disruption; °• Information transmitted may not be sufficient (e.g. poor resolution of images) to allow for appropriate medical decision making by the Practitioner; and/or  °• In rare instances, security protocols could fail, causing a breach of personal health information. ° °Furthermore, I acknowledge that it is my responsibility to provide information about my medical history, conditions and care that is complete and accurate to the best of my ability. I acknowledge that Practitioner's advice, recommendations, and/or decision may be based on factors not within their control, such as incomplete or inaccurate data provided by me or distortions of diagnostic images or specimens that may result from electronic transmissions. I understand that the practice of medicine is not an exact science and that Practitioner makes no warranties or guarantees regarding treatment outcomes. I acknowledge that I will receive a copy of this consent concurrently upon execution via email to the email address I last provided but may also request a printed copy by calling the office of [facility].   ° °I understand that my insurance will be billed for this visit.  ° °I  have read or had this consent read to me. °• I understand the contents of this consent, which adequately explains the benefits and risks of the Services being provided via telemedicine.  °• I have been provided ample opportunity to ask questions regarding this consent and the Services and have had my questions answered to my satisfaction. °• I give my informed consent for the services to be provided through the use of telemedicine in my medical care ° °By participating in this telemedicine visit I agree to the above. ° °

## 2018-06-14 ENCOUNTER — Telehealth: Payer: Self-pay | Admitting: *Deleted

## 2018-06-14 NOTE — Telephone Encounter (Signed)
Hospital follow up reminder call   Transitional Care Questions   Questions for our staff to ask patients on Transitional care 48 hour phone call:   1. Are you/is patient experiencing any problems since coming home? Difficulty urinating  Are there any questions regarding any aspect of care? No  2. Are there any questions regarding medications administration/dosing? No Are meds being taken as prescribed? Yes Patient should review meds with caller to confirm   3. Have there been any falls? No  4. Has Home Health been to the house and/or have they contacted you? They contacted, services declined due to coronavirus If not, have you tried to contact them? Can we help you contact them?   5. Are bowels and bladder emptying properly? Difficulty voiding Are there any unexpected incontinence issues? No If applicable, is patient following bowel/bladder programs?   6. Any fevers, problems with breathing, unexpected pain? No  7. Are there any skin problems or new areas of breakdown? No  8. Has the patient/family member arranged specialty MD follow up (ie cardiology/neurology/renal/surgical/etc)? Yes Can we help arrange?   9. Does the patient need any other services or support that we can help arrange? No  10. Are caregivers following through as expected in assisting the patient? Patient has 3 daughters, medically trained to varying degrees  11. Has the patient quit smoking, drinking alcohol, or using drugs as recommended? Trying to quit smoking, no drink or drugs

## 2018-06-18 ENCOUNTER — Encounter: Payer: Self-pay | Admitting: Physician Assistant

## 2018-06-18 ENCOUNTER — Telehealth (INDEPENDENT_AMBULATORY_CARE_PROVIDER_SITE_OTHER): Payer: BLUE CROSS/BLUE SHIELD | Admitting: Physician Assistant

## 2018-06-18 VITALS — BP 116/60 | HR 64 | Ht 68.0 in | Wt 137.8 lb

## 2018-06-18 DIAGNOSIS — I10 Essential (primary) hypertension: Secondary | ICD-10-CM

## 2018-06-18 DIAGNOSIS — E785 Hyperlipidemia, unspecified: Secondary | ICD-10-CM | POA: Diagnosis not present

## 2018-06-18 DIAGNOSIS — I480 Paroxysmal atrial fibrillation: Secondary | ICD-10-CM

## 2018-06-18 DIAGNOSIS — I2510 Atherosclerotic heart disease of native coronary artery without angina pectoris: Secondary | ICD-10-CM

## 2018-06-18 MED ORDER — DILTIAZEM HCL ER COATED BEADS 240 MG PO CP24: 240.0000 mg | ORAL_CAPSULE | Freq: Every day | ORAL | 3 refills | Status: DC

## 2018-06-18 NOTE — Patient Instructions (Signed)
Medication Instructions:  Your physician has recommended you make the following change in your medication:   STOP Taking Diltiazem 90 mg   Start Taking Cardizem 240 mg Daily   Labwork: NONE  Testing/Procedures: NONE  Follow-Up: Your physician recommends that you schedule a follow-up appointment in: 6-8 Weeks with Dr. Domenic Polite   Any Other Special Instructions Will Be Listed Below (If Applicable).     If you need a refill on your cardiac medications before your next appointment, please call your pharmacy.  Thank you for choosing [facility]!

## 2018-06-18 NOTE — Progress Notes (Signed)
Virtual Visit via Video Note   This visit type was conducted due to national recommendations for restrictions regarding the COVID-19 Pandemic (e.g. social distancing) in an effort to limit this patient's exposure and mitigate transmission in our community.  Due to his co-morbid illnesses, this patient is at least at moderate risk for complications without adequate follow up.  This format is felt to be most appropriate for this patient at this time.  All issues noted in this document were discussed and addressed.  A limited physical exam was performed with this format.  Please refer to the patient's chart for his consent to telehealth for [facility].   Date:  06/18/2018   ID:  Kyle Lowery, DOB 1950/06/04, MRN [id number]  Patient Location: Home Provider Location: Home  PCP:  Rosita Fire, MD  Cardiologist:  Rozann Lesches, MD  Electrophysiologist:  None   Evaluation Performed:  Follow-Up Visit  Chief Complaint:  Hospital f/u  History of Present Illness:    Kyle Lowery is a 68 y.o. male with history of CAD 80% ostail diagonal, 50% pLAD, 30% RCA, 60% Cfx 2012, HTN,HLD, COPD.  Patient discharged to Rehab 05/09/18 after hospitalization for Acute resp failure/ARDS requiring intubation S//P Trach,  secondary to HCAP from Pseudomonas and viridans strep, influenza A and Haemophilus. Also had Afib/flutter with RVR on eliquis and metoprolol. Converted to NSR prior to discharge.CHADSVASC=3 Course complicated by severe anemia requiring transfusions, tube feedings, delirium &deconditioning. Echo showed normal LVEF 55-60%. Discharge from Rehab 06/11/18.  Patient is home and his wife and daughters are helping him to get stronger and do PT. Denies chest pain, shortness of breath. Legs a little swollen.  The patient does not have symptoms concerning for COVID-19 infection (fever, chills, cough, or new shortness of breath).    Past Medical History:  Diagnosis Date  . Acute on chronic  respiratory failure with hypoxia ([place])   . Carotid artery stenosis   . COPD (chronic obstructive pulmonary disease) ([place])   . COPD, severe ([place])   . Coronary atherosclerosis of native coronary artery    Mild to moderate NOCAD with 80% ostial diagonal 1/12  . Delirium, acute   . Depression    Prior suicide attempt  . Essential hypertension, benign   . GERD (gastroesophageal reflux disease)    Esophageal dilatation  . Mixed hyperlipidemia   . Paroxysmal atrial fibrillation (HCC)   . Pneumonia due to Pseudomonas aeruginosa ([place])   . Syncope    Neurocardiogenic   Past Surgical History:  Procedure Laterality Date  . ESOPHAGOGASTRODUODENOSCOPY (EGD) WITH ESOPHAGEAL DILATION    . INGUINAL HERNIA REPAIR    . LEFT HEART CATH     Unable to Stent Blockage  . Left orchiectomy    . VASECTOMY       Current Meds  Medication Sig  . acetaminophen (TYLENOL) 325 MG tablet Take 1-2 tablets (325-650 mg total) by mouth every 4 (four) hours as needed for mild pain.  Marland Kitchen albuterol (PROVENTIL) (2.5 MG/3ML) 0.083% nebulizer solution Take 3 mLs (2.5 mg total) by nebulization every 4 (four) hours as needed for wheezing or shortness of breath.  Marland Kitchen apixaban (ELIQUIS) 5 MG TABS tablet Place 1 tablet (5 mg total) into feeding tube 2 (two) times daily.  . bethanechol (URECHOLINE) 25 MG tablet Take 1 tablet (25 mg total) by mouth 3 (three) times daily.  . budesonide (PULMICORT) 0.5 MG/2ML nebulizer solution Take 2 mLs (0.5 mg total) by nebulization 2 (two) times daily.  Marland Kitchen  clonazePAM (KLONOPIN) 0.25 MG disintegrating tablet Take 1 tablet (0.25 mg total) by mouth every 8 (eight) hours.  . ferrous [id number] C-folic acid (TRINSICON / FOLTRIN) capsule Take 1 capsule by mouth 2 (two) times daily before lunch and supper.  . folic acid (FOLVITE) 1 MG tablet Take 1 tablet (1 mg total) by mouth daily.  Marland Kitchen guaiFENesin 200 MG tablet Take 1 tablet (200 mg total) by mouth every 6 (six) hours.  . isosorbide mononitrate  (IMDUR) 30 MG 24 hr tablet Take 0.5 tablets (15 mg total) by mouth daily.  . Menthol-Methyl Salicylate (MUSCLE RUB) 10-15 % CREA Apply 1 application topically 2 (two) times daily after a meal.  . metoprolol tartrate (LOPRESSOR) 50 MG tablet Take 1 tablet (50 mg total) by mouth every 8 (eight) hours.  . mirtazapine (REMERON) 15 MG tablet Take 1 tablet (15 mg total) by mouth at bedtime.  . pantoprazole (PROTONIX) 40 MG tablet Take 1 tablet (40 mg total) by mouth 2 (two) times daily.  Marland Kitchen PARoxetine (PAXIL) 20 MG tablet Take 1 tablet (20 mg total) by mouth daily.  . QUEtiapine (SEROQUEL) 25 MG tablet Take 1 tablet (25 mg total) by mouth 2 (two) times daily.  . traMADol (ULTRAM) 50 MG tablet Take 1 tablet (50 mg total) by mouth every 6 (six) hours as needed for severe pain.  . [DISCONTINUED] diltiazem (CARDIZEM) 90 MG tablet Take 1 tablet (90 mg total) by mouth every 8 (eight) hours.     Allergies:   Patient has no known allergies.   Social History   Tobacco Use  . Smoking status: Current Every Day Smoker    Packs/day: 1.00    Years: 53.00    Pack years: 53.00    Types: Cigarettes    Start date: 08/23/1963  . Smokeless tobacco: Never Used  . Tobacco comment: Peak rate of 2ppd  Substance Use Topics  . Alcohol use: No    Alcohol/week: 0.0 standard drinks  . Drug use: No     Family Hx: The patient's family history includes Depression in his father; Hypercholesterolemia in his father; Hypertension in his brother, father, and mother; Melanoma in his mother. There is no history of Lung disease.  ROS:   Please see the history of present illness.     All other systems reviewed and are negative.   Prior CV studies:   The following studies were reviewed today:  04/23/18 Echo  Complete  IMPRESSIONS      1. The left ventricle has normal systolic function, with an ejection fraction of 55-60%. The cavity size was normal. Left ventricular diastolic Doppler parameters are indeterminate. No  evidence of left ventricular regional wall motion abnormalities.  2. The right ventricle has normal systolic function. The cavity was normal. There is no increase in right ventricular wall thickness. Right ventricular systolic pressure could not be assessed.  3. The aortic valve is tricuspid Mild thickening of the aortic valve Mild calcification of the aortic valve. Aortic valve regurgitation is trivial by color flow Doppler.. Mild aortic annular calcification noted.  4. The mitral valve is grossly normal.   FINDINGS  Left Ventricle: The left ventricle has normal systolic function, with an ejection fraction of 55-60%. The cavity size was normal. There is no increase in left ventricular wall thickness. Left ventricular diastolic Doppler parameters are indeterminate.  No evidence of left ventricular regional wall motion abnormalities.. Right Ventricle: The right ventricle has normal systolic function. The cavity was normal. There is no increase in  right ventricular wall thickness. Right ventricular systolic pressure could not be assessed. Left Atrium: left atrial size was normal in size Right Atrium: right atrial size was normal in size. Right atrial pressure is estimated at 3 mmHg. Interatrial Septum: No atrial level shunt detected by color flow Doppler. Pericardium: The pericardium was not well visualized. Mitral Valve: The mitral valve is grossly normal. Mitral valve regurgitation is not visualized by color flow Doppler. Tricuspid Valve: The tricuspid valve is normal in structure. Tricuspid valve regurgitation was not visualized by color flow Doppler. Aortic Valve: The aortic valve is tricuspid Mild thickening of the aortic valve Mild calcification of the aortic valve. Aortic valve regurgitation is trivial by color flow Doppler. Mild aortic annular calcification noted. Pulmonic Valve: The pulmonic valve was grossly normal. Pulmonic valve regurgitation is not visualized by color flow Doppler. No  evidence of pulmonic stenosis. Pulmonary Artery: The pulmonary artery is not well seen. Venous: The inferior vena cava is normal in size with greater than 50% respiratory variability.       Labs/Other Tests and Data Reviewed:    EKG:  An ECG dated 05/09/18 was personally reviewed today and demonstrated:  NSR at 94/m  Recent Labs: 04/22/2018: B Natriuretic Peptide 433.0 05/10/2018: TSH 6.159 05/29/2018: Magnesium 2.0 05/31/2018: ALT 15 06/11/2018: BUN 19; Creatinine, Ser 1.04; Hemoglobin 9.6; Platelets 374; Potassium 3.9; Sodium 137   Recent Lipid Panel Lab Results  Component Value Date/Time   CHOL 79 05/04/2018 06:00 PM   TRIG 89 05/04/2018 06:02 PM   HDL 15 (L) 05/04/2018 06:00 PM   CHOLHDL 5.3 05/04/2018 06:00 PM   LDLCALC 47 05/04/2018 06:00 PM    Wt Readings from Last 3 Encounters:  06/18/18 137 lb 12.8 oz (62.5 kg)  06/11/18 122 lb 9.2 oz (55.6 kg)  05/09/18 139 lb 12.4 oz (63.4 kg)     Objective:    Vital Signs:  BP 116/60   Pulse 64   Ht 5\' 8"  (1.727 m)   Wt 137 lb 12.8 oz (62.5 kg)   BMI 20.95 kg/m    VITAL SIGNS:  reviewed GEN:  no acute distress RESPIRATORY:  normal respiratory effort, symmetric expansion CARDIOVASCULAR:  lower extremity edema noted  ASSESSMENT & PLAN:    1. CAD on cath 2012-no angina 2. Afib/flutter with RVR in setting of Acute respiratory failure converted to NSR. CHADSVASC=3 on eliquis and metoprolol and diltiazem 90 mg tid-will change to 240 mg once daily. Echo with normal LV function. HR controlled 3. Essential HTN BP well controlled  4. HLD 5. COPD with recent hospitalization and rehab for Resp failure/ARDS/Tracheostomy   COVID-19 Education: The signs and symptoms of COVID-19 were discussed with the patient and how to seek care for testing (follow up with PCP or arrange E-visit).   The importance of social distancing was discussed today.  Time:   Today, I have spent 27 minutes with the patient with telehealth technology discussing  the above problems.     Medication Adjustments/Labs and Tests Ordered: Current medicines are reviewed at length with the patient today.  Concerns regarding medicines are outlined above.   Tests Ordered: No orders of the defined types were placed in this encounter.   Medication Changes: Meds ordered this encounter  Medications  . diltiazem (CARDIZEM CD) 240 MG 24 hr capsule    Sig: Take 1 capsule (240 mg total) by mouth daily.    Dispense:  90 capsule    Refill:  3    Disposition:  Follow up  in 2 month(s) Dr. Domenic Polite  Signed, Ermalinda Barrios, PA-C  06/18/2018 3:02 PM    [place]

## 2018-06-20 DIAGNOSIS — R339 Retention of urine, unspecified: Secondary | ICD-10-CM | POA: Diagnosis not present

## 2018-07-03 ENCOUNTER — Encounter
Payer: BLUE CROSS/BLUE SHIELD | Attending: Physical Medicine & Rehabilitation | Admitting: Physical Medicine & Rehabilitation

## 2018-07-03 ENCOUNTER — Encounter: Payer: Self-pay | Admitting: Physical Medicine & Rehabilitation

## 2018-07-03 ENCOUNTER — Encounter: Payer: BLUE CROSS/BLUE SHIELD | Admitting: Physical Medicine & Rehabilitation

## 2018-07-03 ENCOUNTER — Other Ambulatory Visit: Payer: Self-pay

## 2018-07-03 VITALS — Ht 68.0 in | Wt 137.8 lb

## 2018-07-03 DIAGNOSIS — R5381 Other malaise: Secondary | ICD-10-CM

## 2018-07-03 DIAGNOSIS — R339 Retention of urine, unspecified: Secondary | ICD-10-CM | POA: Diagnosis not present

## 2018-07-03 NOTE — Progress Notes (Signed)
Subjective:    Patient ID: Kyle Lowery, male    DOB: May 08, 1950, 68 y.o.   MRN: [id number]  HPI   Due to national recommendations of social distancing because of COVID 51, an audio/video tele-health visit is felt to be the most appropriate encounter for this patient at this time. See MyChart message from today for the patient's consent to a tele-health encounter with [facility]. This is a follow up telephone visit for the patient who is at home. MD is at office.    I am meeting with the patient today regarding his inpatient rehab stay and associated disability. He is working on ambulation, steps, strengthening with his daughter.  He is walking without a device. He denies any falls. Daughter has helped him with exercises as wife states she has a "medical background". Family declined [facility] services because of COVID.  Pain is controlled. His trach stoma has closed. His appetite is good. "I am eating like a pig". His bowels and bladder are moving. He thinks he is still on urecholine for his bladder.      Pain Inventory 0 Mobility walk without assistance ability to climb steps?  yes  Function disabled: date disabled 2010  Neuro/Psych tremor  Prior Studies Any changes since last visit?  no  Physicians involved in your care Any changes since last visit?  no   Family History  Problem Relation Age of Onset  . Depression Father        Suicide age 40  . Hypercholesterolemia Father   . Hypertension Father   . Hypertension Mother   . Melanoma Mother   . Hypertension Brother   . Lung disease Neg Hx    Social History   Socioeconomic History  . Marital status: Married    Spouse name: Not on file  . Number of children: Not on file  . Years of education: Not on file  . Highest education level: Not on file  Occupational History  . Occupation: Disabled  Social Needs  . Financial resource strain: Not on file  . Food insecurity:    Worry: Not  on file    Inability: Not on file  . Transportation needs:    Medical: Not on file    Non-medical: Not on file  Tobacco Use  . Smoking status: Current Every Day Smoker    Packs/day: 1.00    Years: 53.00    Pack years: 53.00    Types: Cigarettes    Start date: 08/23/1963  . Smokeless tobacco: Never Used  . Tobacco comment: Peak rate of 2ppd  Substance and Sexual Activity  . Alcohol use: No    Alcohol/week: 0.0 standard drinks  . Drug use: No  . Sexual activity: Not on file  Lifestyle  . Physical activity:    Days per week: Not on file    Minutes per session: Not on file  . Stress: Not on file  Relationships  . Social connections:    Talks on phone: Not on file    Gets together: Not on file    Attends religious service: Not on file    Active member of club or organization: Not on file    Attends meetings of clubs or organizations: Not on file    Relationship status: Not on file  Other Topics Concern  . Not on file  Social History Narrative   Disabled - mainly Architect    Married - third marriage   Tobacco Use -  Yes.    1 child      [facility] (02/12/16):   Originally from [place]. He has lived in [place] most of his life. Previously has worked as a Dealer and also in Architect. Unsure of asbestos exposure. No mold exposure. Currently has an outside cat. No bird exposure. Denies any travel.    Past Surgical History:  Procedure Laterality Date  . ESOPHAGOGASTRODUODENOSCOPY (EGD) WITH ESOPHAGEAL DILATION    . INGUINAL HERNIA REPAIR    . LEFT HEART CATH     Unable to Stent Blockage  . Left orchiectomy    . VASECTOMY     Past Medical History:  Diagnosis Date  . Acute on chronic respiratory failure with hypoxia ([place])   . Carotid artery stenosis   . COPD (chronic obstructive pulmonary disease) ([place])   . COPD, severe ([place])   . Coronary atherosclerosis of native coronary artery    Mild to moderate NOCAD with 80% ostial diagonal 1/12  . Delirium, acute   .  Depression    Prior suicide attempt  . Essential hypertension, benign   . GERD (gastroesophageal reflux disease)    Esophageal dilatation  . Mixed hyperlipidemia   . Paroxysmal atrial fibrillation (HCC)   . Pneumonia due to Pseudomonas aeruginosa ([place])   . Syncope    Neurocardiogenic   There were no vitals taken for this visit.  Opioid Risk Score:   Fall Risk Score:  `1  Depression screen PHQ 2/9  No flowsheet data found.   Review of Systems  Constitutional: Negative.   HENT: Negative.   Eyes: Negative.   Respiratory: Positive for cough.   Cardiovascular: Negative.   Gastrointestinal: Negative.   Endocrine: Negative.   Genitourinary: Negative.   Musculoskeletal: Negative.   Skin: Negative.   Allergic/Immunologic: Negative.   Neurological: Positive for tremors.  Hematological: Negative.   Psychiatric/Behavioral: Negative.   All other systems reviewed and are negative.        Assessment & Plan:  1. Mobility deficits, limitations in endurance, limitation self-care, dysphagia secondary to debility.             Continue HEP 2.  Mood:  Continue klonopin---f/u with pcp regarding continuing             Seroquel taper to off 3. COPD/hx of lung nodules: stoma closed  -further mgt per primar 4. HTN: per primary 5.  Urinary retention             wean of urecholine  11 minutes of tele-visit time was spent with this patient today. Follow up prn

## 2018-07-25 DIAGNOSIS — F339 Major depressive disorder, recurrent, unspecified: Secondary | ICD-10-CM | POA: Diagnosis not present

## 2018-07-25 DIAGNOSIS — I2510 Atherosclerotic heart disease of native coronary artery without angina pectoris: Secondary | ICD-10-CM | POA: Diagnosis not present

## 2018-07-25 DIAGNOSIS — I10 Essential (primary) hypertension: Secondary | ICD-10-CM | POA: Diagnosis not present

## 2018-07-25 DIAGNOSIS — J449 Chronic obstructive pulmonary disease, unspecified: Secondary | ICD-10-CM | POA: Diagnosis not present

## 2018-08-07 DIAGNOSIS — I10 Essential (primary) hypertension: Secondary | ICD-10-CM | POA: Diagnosis not present

## 2018-08-07 DIAGNOSIS — I2510 Atherosclerotic heart disease of native coronary artery without angina pectoris: Secondary | ICD-10-CM | POA: Diagnosis not present

## 2018-08-07 DIAGNOSIS — J449 Chronic obstructive pulmonary disease, unspecified: Secondary | ICD-10-CM | POA: Diagnosis not present

## 2018-08-07 DIAGNOSIS — Z0001 Encounter for general adult medical examination with abnormal findings: Secondary | ICD-10-CM | POA: Diagnosis not present

## 2018-08-14 ENCOUNTER — Encounter: Payer: Self-pay | Admitting: Nurse Practitioner

## 2018-08-15 ENCOUNTER — Encounter: Payer: Self-pay | Admitting: Cardiology

## 2018-08-15 ENCOUNTER — Ambulatory Visit (INDEPENDENT_AMBULATORY_CARE_PROVIDER_SITE_OTHER): Payer: BC Managed Care – PPO | Admitting: Cardiology

## 2018-08-15 ENCOUNTER — Other Ambulatory Visit: Payer: Self-pay

## 2018-08-15 VITALS — BP 150/72 | HR 64 | Temp 98.2°F | Ht 68.0 in | Wt 141.0 lb

## 2018-08-15 DIAGNOSIS — I4891 Unspecified atrial fibrillation: Secondary | ICD-10-CM

## 2018-08-15 DIAGNOSIS — Z79899 Other long term (current) drug therapy: Secondary | ICD-10-CM

## 2018-08-15 DIAGNOSIS — I4892 Unspecified atrial flutter: Secondary | ICD-10-CM

## 2018-08-15 DIAGNOSIS — I2510 Atherosclerotic heart disease of native coronary artery without angina pectoris: Secondary | ICD-10-CM | POA: Diagnosis not present

## 2018-08-15 DIAGNOSIS — I10 Essential (primary) hypertension: Secondary | ICD-10-CM

## 2018-08-15 NOTE — Patient Instructions (Signed)
Medication Instructions: Your physician recommends that you continue on your current medications as directed. Please refer to the Current Medication list given to you today.   Labwork: CBC and BMET JUST BEFORE your visit in 6 months  Procedures/Testing: None  Follow-Up:  6 months with Dr.McDowell   Any Additional Special Instructions Will Be Listed Below (If Applicable).     If you need a refill on your cardiac medications before your next appointment, please call your pharmacy.        Thank you for choosing [facility] !

## 2018-08-15 NOTE — Progress Notes (Signed)
Cardiology Office Note  Date: 08/15/2018   ID: Kyle Lowery, DOB 1950-07-08, MRN [id number]  PCP:  Rosita Fire, MD  Cardiologist:  Rozann Lesches, MD Electrophysiologist:  None   Chief Complaint  Patient presents with   Coronary Artery Disease   PAF    History of Present Illness: Kyle Lowery is a 68 y.o. male that I have not seen in the office since 2016.  Last encounter was via telehealth in May with Ms. Bonnell Public PA-C, I reviewed the note and interval history.  He presents today for a routine visit.  States that he has made progress in terms of functional capacity, but still has "good days and bad days."  He is able to do outdoor work if he paces himself.  He does not report any obvious angina symptoms or definite sense of palpitations.  I reviewed his medications which are outlined below.  He reports compliance with heart rate control regimen and no bleeding problems on Eliquis.  Interval lab work reviewed below.  Past Medical History:  Diagnosis Date   Acute on chronic respiratory failure with hypoxia (HCC)    Carotid artery stenosis    COPD (chronic obstructive pulmonary disease) (HCC)    Coronary atherosclerosis of native coronary artery    Mild to moderate NOCAD with 80% ostial diagonal 1/12   Delirium, acute    Depression    Prior suicide attempt   Essential hypertension    GERD (gastroesophageal reflux disease)    Esophageal dilatation   Mixed hyperlipidemia    Paroxysmal atrial fibrillation (HCC)    Pneumonia due to Pseudomonas aeruginosa ([place])    Syncope    Neurocardiogenic    Past Surgical History:  Procedure Laterality Date   ESOPHAGOGASTRODUODENOSCOPY (EGD) WITH ESOPHAGEAL DILATION     INGUINAL HERNIA REPAIR     LEFT HEART CATH     Unable to Stent Blockage   Left orchiectomy     VASECTOMY      Current Outpatient Medications  Medication Sig Dispense Refill   albuterol (PROVENTIL) (2.5 MG/3ML) 0.083% nebulizer solution  Take 3 mLs (2.5 mg total) by nebulization every 4 (four) hours as needed for wheezing or shortness of breath. 75 mL 12   budesonide (PULMICORT) 0.5 MG/2ML nebulizer solution Take 2 mLs (0.5 mg total) by nebulization 2 (two) times daily. 120 mL 12   diltiazem (CARDIZEM CD) 240 MG 24 hr capsule Take 1 capsule (240 mg total) by mouth daily. 90 capsule 3   ferrous [id number] C-folic acid (TRINSICON / FOLTRIN) capsule Take 1 capsule by mouth 2 (two) times daily before lunch and supper. 60 capsule 1   folic acid (FOLVITE) 1 MG tablet Take 1 tablet (1 mg total) by mouth daily. 30 tablet 1   guaiFENesin 200 MG tablet Take 1 tablet (200 mg total) by mouth every 6 (six) hours. 60 tablet 0   isosorbide mononitrate (IMDUR) 30 MG 24 hr tablet Take 0.5 tablets (15 mg total) by mouth daily. 15 tablet 1   Menthol-Methyl Salicylate (MUSCLE RUB) 10-15 % CREA Apply 1 application topically 2 (two) times daily after a meal.  0   metoprolol tartrate (LOPRESSOR) 50 MG tablet Take 1 tablet (50 mg total) by mouth every 8 (eight) hours. 90 tablet 1   pantoprazole (PROTONIX) 40 MG tablet Take 1 tablet (40 mg total) by mouth 2 (two) times daily. 60 tablet 1   acetaminophen (TYLENOL) 325 MG tablet Take 1-2 tablets (325-650 mg total) by mouth every 4 (  four) hours as needed for mild pain.     apixaban (ELIQUIS) 5 MG TABS tablet Place 1 tablet (5 mg total) into feeding tube 2 (two) times daily. 60 tablet 1   bethanechol (URECHOLINE) 25 MG tablet Take 1 tablet (25 mg total) by mouth 3 (three) times daily. 90 tablet 1   clonazePAM (KLONOPIN) 0.25 MG disintegrating tablet Take 1 tablet (0.25 mg total) by mouth every 8 (eight) hours. 60 tablet 0   No current facility-administered medications for this visit.    Allergies:  Patient has no known allergies.   Social History: The patient  reports that he quit smoking about 2 months ago. His smoking use included cigarettes. He started smoking about 55 years ago. He  has a 53.00 pack-year smoking history. He has never used smokeless tobacco. He reports that he does not drink alcohol or use drugs.   ROS:  Please see the history of present illness. Otherwise, complete review of systems is positive for none.  All other systems are reviewed and negative.   Physical Exam: VS:  BP (!) 150/72 (BP Location: Left Arm)    Pulse 64    Temp 98.2 F (36.8 C)    Ht 5\' 8"  (1.727 m)    Wt 141 lb (64 kg)    SpO2 94%    BMI 21.44 kg/m , BMI Body mass index is 21.44 kg/m.  Wt Readings from Last 3 Encounters:  08/15/18 141 lb (64 kg)  07/03/18 137 lb 12.8 oz (62.5 kg)  06/18/18 137 lb 12.8 oz (62.5 kg)    General: Patient appears comfortable at rest. HEENT: Conjunctiva and lids normal. Neck: Supple, no elevated JVP or carotid bruits, no thyromegaly. Lungs: Clear to auscultation, nonlabored breathing at rest. Cardiac: Regular rate and rhythm, no S3 or significant systolic murmur, no pericardial rub. Abdomen: Soft, nontender, bowel sounds present. Extremities: No pitting edema, distal pulses 2+. Skin: Warm and dry. Musculoskeletal: No kyphosis. Neuropsychiatric: Alert and oriented x3, affect grossly appropriate.  ECG:  An ECG dated 05/09/2018 was personally reviewed today and demonstrated:  Normal sinus rhythm.  Recent Labwork: 04/22/2018: B Natriuretic Peptide 433.0 05/10/2018: TSH 6.159 05/29/2018: Magnesium 2.0 05/31/2018: ALT 15; AST 16 06/11/2018: BUN 19; Creatinine, Ser 1.04; Hemoglobin 9.6; Platelets 374; Potassium 3.9; Sodium 137     Component Value Date/Time   CHOL 79 05/04/2018 1800   TRIG 89 05/04/2018 1802   HDL 15 (L) 05/04/2018 1800   CHOLHDL 5.3 05/04/2018 1800   VLDL 17 05/04/2018 1800   LDLCALC 47 05/04/2018 1800    Other Studies Reviewed Today:  Echocardiogram 04/23/2018:  1. The left ventricle has normal systolic function, with an ejection fraction of 55-60%. The cavity size was normal. Left ventricular diastolic Doppler parameters are  indeterminate. No evidence of left ventricular regional wall motion abnormalities.  2. The right ventricle has normal systolic function. The cavity was normal. There is no increase in right ventricular wall thickness. Right ventricular systolic pressure could not be assessed.  3. The aortic valve is tricuspid Mild thickening of the aortic valve Mild calcification of the aortic valve. Aortic valve regurgitation is trivial by color flow Doppler.. Mild aortic annular calcification noted.  4. The mitral valve is grossly normal.  Assessment and Plan:  1.  Paroxysmal atrial fibrillation and flutter with CHADSVASC score of 3.  He reports no palpitations and his heart rate is regular today.  Plan to continue Eliquis for stroke prophylaxis, also maintain combination of Cardizem CD and Lopressor which  he is tolerating.  Follow-up CBC and BMET for next visit.  2.  Mild to moderate multivessel CAD being managed medically.  He reports no active angina symptoms at this time.  Currently not on aspirin given use of Eliquis.  3.  Essential hypertension, blood pressure is elevated today.  Continue with current treatment, keep follow-up with Dr. Legrand Rams.  Could consider addition of ARB.  4.  Severe COPD.  Medication Adjustments/Labs and Tests Ordered: Current medicines are reviewed at length with the patient today.  Concerns regarding medicines are outlined above.   Tests Ordered: Orders Placed This Encounter  Procedures   CBC   Basic Metabolic Panel (BMET)    Medication Changes: No orders of the defined types were placed in this encounter.   Disposition:  Follow up 6 months in the [place] office.  Signed, Satira Sark, MD, [facility] 08/15/2018 10:51 AM    [facility] [address], [place], [place] [address] Phone: [phone]; Fax: [phone]

## 2018-09-17 ENCOUNTER — Telehealth: Payer: Self-pay | Admitting: Nurse Practitioner

## 2018-09-17 ENCOUNTER — Other Ambulatory Visit: Payer: Self-pay | Admitting: Physical Medicine and Rehabilitation

## 2018-09-17 ENCOUNTER — Ambulatory Visit: Payer: Medicare Other | Admitting: Nurse Practitioner

## 2018-09-17 ENCOUNTER — Encounter: Payer: Self-pay | Admitting: Nurse Practitioner

## 2018-09-17 NOTE — Progress Notes (Deleted)
Primary Care Physician:  Rosita Fire, MD Primary Gastroenterologist:  Dr.   Rayne Du chief complaint on file.   HPI:   Kyle Lowery is a 68 y.o. male who presents on referral from primary care to schedule colonoscopy.  Nurse/phone triage was deferred to office visit due to anticoagulation medication.  Reviewed information provided with referral including ***.  Last colonoscopy dated 11/21/2003 which found 3 small polyps and small external hemorrhoids felt to be source of intermittent hematochezia.  No surgical pathology lab report can be found.  Regardless, the patient would be overdue for colonoscopy even with a 10-year surveillance recommendation.  Today he states   Past Medical History:  Diagnosis Date  . Acute on chronic respiratory failure with hypoxia ([place])   . Carotid artery stenosis   . COPD (chronic obstructive pulmonary disease) ([place])   . Coronary atherosclerosis of native coronary artery    Mild to moderate NOCAD with 80% ostial diagonal 1/12  . Delirium, acute   . Depression    Prior suicide attempt  . Essential hypertension   . GERD (gastroesophageal reflux disease)    Esophageal dilatation  . Mixed hyperlipidemia   . Paroxysmal atrial fibrillation (HCC)   . Pneumonia due to Pseudomonas aeruginosa ([place])   . Syncope    Neurocardiogenic    Past Surgical History:  Procedure Laterality Date  . ESOPHAGOGASTRODUODENOSCOPY (EGD) WITH ESOPHAGEAL DILATION    . INGUINAL HERNIA REPAIR    . LEFT HEART CATH     Unable to Stent Blockage  . Left orchiectomy    . VASECTOMY      Current Outpatient Medications  Medication Sig Dispense Refill  . acetaminophen (TYLENOL) 325 MG tablet Take 1-2 tablets (325-650 mg total) by mouth every 4 (four) hours as needed for mild pain.    Marland Kitchen albuterol (PROVENTIL) (2.5 MG/3ML) 0.083% nebulizer solution Take 3 mLs (2.5 mg total) by nebulization every 4 (four) hours as needed for wheezing or shortness of breath. 75 mL 12  . apixaban  (ELIQUIS) 5 MG TABS tablet Place 1 tablet (5 mg total) into feeding tube 2 (two) times daily. 60 tablet 1  . bethanechol (URECHOLINE) 25 MG tablet Take 1 tablet (25 mg total) by mouth 3 (three) times daily. 90 tablet 1  . budesonide (PULMICORT) 0.5 MG/2ML nebulizer solution Take 2 mLs (0.5 mg total) by nebulization 2 (two) times daily. 120 mL 12  . clonazePAM (KLONOPIN) 0.25 MG disintegrating tablet Take 1 tablet (0.25 mg total) by mouth every 8 (eight) hours. 60 tablet 0  . diltiazem (CARDIZEM CD) 240 MG 24 hr capsule Take 1 capsule (240 mg total) by mouth daily. 90 capsule 3  . ferrous [id number] C-folic acid (TRINSICON / FOLTRIN) capsule Take 1 capsule by mouth 2 (two) times daily before lunch and supper. 60 capsule 1  . folic acid (FOLVITE) 1 MG tablet Take 1 tablet (1 mg total) by mouth daily. 30 tablet 1  . guaiFENesin 200 MG tablet Take 1 tablet (200 mg total) by mouth every 6 (six) hours. 60 tablet 0  . isosorbide mononitrate (IMDUR) 30 MG 24 hr tablet Take 0.5 tablets (15 mg total) by mouth daily. 15 tablet 1  . Menthol-Methyl Salicylate (MUSCLE RUB) 10-15 % CREA Apply 1 application topically 2 (two) times daily after a meal.  0  . metoprolol tartrate (LOPRESSOR) 50 MG tablet Take 1 tablet (50 mg total) by mouth every 8 (eight) hours. 90 tablet 1  . pantoprazole (PROTONIX) 40 MG tablet Take  1 tablet (40 mg total) by mouth 2 (two) times daily. 60 tablet 1   No current facility-administered medications for this visit.     Allergies as of 09/17/2018  . (No Known Allergies)    Family History  Problem Relation Age of Onset  . Depression Father        Suicide age 21  . Hypercholesterolemia Father   . Hypertension Father   . Hypertension Mother   . Melanoma Mother   . Hypertension Brother   . Lung disease Neg Hx     Social History   Socioeconomic History  . Marital status: Married    Spouse name: Not on file  . Number of children: Not on file  . Years of education:  Not on file  . Highest education level: Not on file  Occupational History  . Occupation: Disabled  Social Needs  . Financial resource strain: Not on file  . Food insecurity    Worry: Not on file    Inability: Not on file  . Transportation needs    Medical: Not on file    Non-medical: Not on file  Tobacco Use  . Smoking status: Former Smoker    Packs/day: 1.00    Years: 53.00    Pack years: 53.00    Types: Cigarettes    Start date: 08/23/1963    Quit date: 06/08/2018    Years since quitting: 0.2  . Smokeless tobacco: Never Used  . Tobacco comment: Peak rate of 2ppd  Substance and Sexual Activity  . Alcohol use: No    Alcohol/week: 0.0 standard drinks  . Drug use: No  . Sexual activity: Not on file  Lifestyle  . Physical activity    Days per week: Not on file    Minutes per session: Not on file  . Stress: Not on file  Relationships  . Social Herbalist on phone: Not on file    Gets together: Not on file    Attends religious service: Not on file    Active member of club or organization: Not on file    Attends meetings of clubs or organizations: Not on file    Relationship status: Not on file  . Intimate partner violence    Fear of current or ex partner: Not on file    Emotionally abused: Not on file    Physically abused: Not on file    Forced sexual activity: Not on file  Other Topics Concern  . Not on file  Social History Narrative   Disabled - mainly Architect    Married - third marriage   Tobacco Use - Yes.    1 child      [facility] (02/12/16):   Originally from [place]. He has lived in [place] most of his life. Previously has worked as a Dealer and also in Architect. Unsure of asbestos exposure. No mold exposure. Currently has an outside cat. No bird exposure. Denies any travel.     Review of Systems: General: Negative for anorexia, weight loss, fever, chills, fatigue, weakness. Eyes: Negative for vision changes.  ENT: Negative for hoarseness,  difficulty swallowing , nasal congestion. CV: Negative for chest pain, angina, palpitations, dyspnea on exertion, peripheral edema.  Respiratory: Negative for dyspnea at rest, dyspnea on exertion, cough, sputum, wheezing.  GI: See history of present illness. GU:  Negative for dysuria, hematuria, urinary incontinence, urinary frequency, nocturnal urination.  MS: Negative for joint pain, low back pain.  Derm: Negative for rash  or itching.  Neuro: Negative for weakness, abnormal sensation, seizure, frequent headaches, memory loss, confusion.  Psych: Negative for anxiety, depression, suicidal ideation, hallucinations.  Endo: Negative for unusual weight change.  Heme: Negative for bruising or bleeding. Allergy: Negative for rash or hives.    Physical Exam: There were no vitals taken for this visit. General:   Alert and oriented. Pleasant and cooperative. Well-nourished and well-developed.  Head:  Normocephalic and atraumatic. Eyes:  Without icterus, sclera clear and conjunctiva pink.  Ears:  Normal auditory acuity. Mouth:  No deformity or lesions, oral mucosa pink.  Throat/Neck:  Supple, without mass or thyromegaly. Cardiovascular:  S1, S2 present without murmurs appreciated. Normal pulses noted. Extremities without clubbing or edema. Respiratory:  Clear to auscultation bilaterally. No wheezes, rales, or rhonchi. No distress.  Gastrointestinal:  +BS, soft, non-tender and non-distended. No HSM noted. No guarding or rebound. No masses appreciated.  Rectal:  Deferred  Musculoskalatal:  Symmetrical without gross deformities. Normal posture. Skin:  Intact without significant lesions or rashes. Neurologic:  Alert and oriented x4;  grossly normal neurologically. Psych:  Alert and cooperative. Normal mood and affect. Heme/Lymph/Immune: No significant cervical adenopathy. No excessive bruising noted.    09/17/2018 7:41 AM   Disclaimer: This note was dictated with voice recognition software.  Similar sounding words can inadvertently be transcribed and may not be corrected upon review.

## 2018-09-17 NOTE — Telephone Encounter (Signed)
PATIENT WAS A NO SHOW AND LETTER SENT  °

## 2018-11-23 DIAGNOSIS — R52 Pain, unspecified: Secondary | ICD-10-CM | POA: Diagnosis not present

## 2018-11-23 DIAGNOSIS — Z6821 Body mass index (BMI) 21.0-21.9, adult: Secondary | ICD-10-CM | POA: Diagnosis not present

## 2018-11-23 DIAGNOSIS — R05 Cough: Secondary | ICD-10-CM | POA: Diagnosis not present

## 2018-11-23 DIAGNOSIS — J441 Chronic obstructive pulmonary disease with (acute) exacerbation: Secondary | ICD-10-CM | POA: Diagnosis not present

## 2018-12-18 DIAGNOSIS — I2510 Atherosclerotic heart disease of native coronary artery without angina pectoris: Secondary | ICD-10-CM | POA: Diagnosis not present

## 2018-12-18 DIAGNOSIS — I10 Essential (primary) hypertension: Secondary | ICD-10-CM | POA: Diagnosis not present

## 2018-12-18 DIAGNOSIS — J449 Chronic obstructive pulmonary disease, unspecified: Secondary | ICD-10-CM | POA: Diagnosis not present

## 2018-12-18 DIAGNOSIS — Z23 Encounter for immunization: Secondary | ICD-10-CM | POA: Diagnosis not present

## 2019-01-17 DIAGNOSIS — J988 Other specified respiratory disorders: Secondary | ICD-10-CM | POA: Diagnosis not present

## 2019-01-17 DIAGNOSIS — R432 Parageusia: Secondary | ICD-10-CM | POA: Diagnosis not present

## 2019-01-17 DIAGNOSIS — R05 Cough: Secondary | ICD-10-CM | POA: Diagnosis not present

## 2019-01-17 DIAGNOSIS — R0602 Shortness of breath: Secondary | ICD-10-CM | POA: Diagnosis not present

## 2019-01-17 DIAGNOSIS — J029 Acute pharyngitis, unspecified: Secondary | ICD-10-CM | POA: Diagnosis not present

## 2019-01-17 DIAGNOSIS — R1110 Vomiting, unspecified: Secondary | ICD-10-CM | POA: Diagnosis not present

## 2019-01-17 DIAGNOSIS — R0689 Other abnormalities of breathing: Secondary | ICD-10-CM | POA: Diagnosis not present

## 2019-01-17 DIAGNOSIS — R430 Anosmia: Secondary | ICD-10-CM | POA: Diagnosis not present

## 2019-02-03 DIAGNOSIS — J020 Streptococcal pharyngitis: Secondary | ICD-10-CM | POA: Diagnosis not present

## 2019-02-15 DIAGNOSIS — I2510 Atherosclerotic heart disease of native coronary artery without angina pectoris: Secondary | ICD-10-CM | POA: Diagnosis not present

## 2019-02-15 DIAGNOSIS — J189 Pneumonia, unspecified organism: Secondary | ICD-10-CM | POA: Diagnosis not present

## 2019-02-15 DIAGNOSIS — F329 Major depressive disorder, single episode, unspecified: Secondary | ICD-10-CM | POA: Diagnosis not present

## 2019-02-15 DIAGNOSIS — I110 Hypertensive heart disease with heart failure: Secondary | ICD-10-CM | POA: Diagnosis not present

## 2019-02-15 DIAGNOSIS — A419 Sepsis, unspecified organism: Secondary | ICD-10-CM | POA: Diagnosis present

## 2019-02-15 DIAGNOSIS — I5023 Acute on chronic systolic (congestive) heart failure: Secondary | ICD-10-CM | POA: Diagnosis present

## 2019-02-15 DIAGNOSIS — J9601 Acute respiratory failure with hypoxia: Secondary | ICD-10-CM | POA: Diagnosis not present

## 2019-02-15 DIAGNOSIS — J440 Chronic obstructive pulmonary disease with acute lower respiratory infection: Secondary | ICD-10-CM | POA: Diagnosis present

## 2019-02-15 DIAGNOSIS — R7989 Other specified abnormal findings of blood chemistry: Secondary | ICD-10-CM | POA: Diagnosis not present

## 2019-02-15 DIAGNOSIS — I080 Rheumatic disorders of both mitral and aortic valves: Secondary | ICD-10-CM | POA: Diagnosis not present

## 2019-02-15 DIAGNOSIS — U071 COVID-19: Secondary | ICD-10-CM | POA: Diagnosis not present

## 2019-02-15 DIAGNOSIS — I509 Heart failure, unspecified: Secondary | ICD-10-CM | POA: Diagnosis not present

## 2019-02-15 DIAGNOSIS — E876 Hypokalemia: Secondary | ICD-10-CM | POA: Diagnosis not present

## 2019-02-15 DIAGNOSIS — I160 Hypertensive urgency: Secondary | ICD-10-CM | POA: Diagnosis present

## 2019-02-15 DIAGNOSIS — F17200 Nicotine dependence, unspecified, uncomplicated: Secondary | ICD-10-CM | POA: Diagnosis not present

## 2019-02-15 DIAGNOSIS — E7800 Pure hypercholesterolemia, unspecified: Secondary | ICD-10-CM | POA: Diagnosis present

## 2019-02-15 DIAGNOSIS — K219 Gastro-esophageal reflux disease without esophagitis: Secondary | ICD-10-CM | POA: Diagnosis present

## 2019-02-15 DIAGNOSIS — Z720 Tobacco use: Secondary | ICD-10-CM | POA: Diagnosis not present

## 2019-02-15 DIAGNOSIS — Z7901 Long term (current) use of anticoagulants: Secondary | ICD-10-CM | POA: Diagnosis not present

## 2019-02-15 DIAGNOSIS — Z20828 Contact with and (suspected) exposure to other viral communicable diseases: Secondary | ICD-10-CM | POA: Diagnosis not present

## 2019-02-15 DIAGNOSIS — J9690 Respiratory failure, unspecified, unspecified whether with hypoxia or hypercapnia: Secondary | ICD-10-CM | POA: Diagnosis not present

## 2019-02-15 DIAGNOSIS — R0602 Shortness of breath: Secondary | ICD-10-CM | POA: Diagnosis not present

## 2019-02-15 DIAGNOSIS — J9622 Acute and chronic respiratory failure with hypercapnia: Secondary | ICD-10-CM | POA: Diagnosis present

## 2019-02-15 DIAGNOSIS — Z20822 Contact with and (suspected) exposure to covid-19: Secondary | ICD-10-CM | POA: Diagnosis not present

## 2019-02-15 DIAGNOSIS — J9621 Acute and chronic respiratory failure with hypoxia: Secondary | ICD-10-CM | POA: Diagnosis present

## 2019-02-16 DIAGNOSIS — R7989 Other specified abnormal findings of blood chemistry: Secondary | ICD-10-CM | POA: Diagnosis not present

## 2019-02-16 DIAGNOSIS — J9690 Respiratory failure, unspecified, unspecified whether with hypoxia or hypercapnia: Secondary | ICD-10-CM | POA: Diagnosis not present

## 2019-02-16 DIAGNOSIS — I509 Heart failure, unspecified: Secondary | ICD-10-CM | POA: Diagnosis not present

## 2019-02-16 DIAGNOSIS — J189 Pneumonia, unspecified organism: Secondary | ICD-10-CM | POA: Diagnosis not present

## 2019-02-17 DIAGNOSIS — I509 Heart failure, unspecified: Secondary | ICD-10-CM | POA: Diagnosis not present

## 2019-02-17 DIAGNOSIS — J9690 Respiratory failure, unspecified, unspecified whether with hypoxia or hypercapnia: Secondary | ICD-10-CM | POA: Diagnosis not present

## 2019-02-17 DIAGNOSIS — J189 Pneumonia, unspecified organism: Secondary | ICD-10-CM | POA: Diagnosis not present

## 2019-02-17 DIAGNOSIS — R7989 Other specified abnormal findings of blood chemistry: Secondary | ICD-10-CM | POA: Diagnosis not present

## 2019-02-19 ENCOUNTER — Encounter: Payer: Self-pay | Admitting: Cardiovascular Disease

## 2019-03-19 ENCOUNTER — Encounter: Payer: Self-pay | Admitting: Cardiology

## 2019-03-19 ENCOUNTER — Ambulatory Visit: Payer: BC Managed Care – PPO | Admitting: Cardiology

## 2019-03-19 NOTE — Progress Notes (Deleted)
Cardiology Office Note  Date: 03/19/2019   ID: Kyle Lowery, DOB 1950/07/31, MRN [id number]  PCP:  Rosita Fire, MD  Cardiologist:  Rozann Lesches, MD Electrophysiologist:  None   No chief complaint on file.   History of Present Illness: Kyle Lowery is a 69 y.o. male last seen in July 2020.  Recent follow-up echocardiogram performed at [facility] in January indicated LVEF approximately 35% with grade 1 diastolic dysfunction, mild mitral regurgitation, mild to moderate aortic regurgitation, and mild to moderate left atrial enlargement.  Past Medical History:  Diagnosis Date  . Acute on chronic respiratory failure with hypoxia ([place])   . Carotid artery stenosis   . COPD (chronic obstructive pulmonary disease) ([place])   . Coronary atherosclerosis of native coronary artery    Mild to moderate NOCAD with 80% ostial diagonal 1/12  . Delirium, acute   . Depression    Prior suicide attempt  . Essential hypertension   . GERD (gastroesophageal reflux disease)    Esophageal dilatation  . Mixed hyperlipidemia   . Paroxysmal atrial fibrillation (HCC)   . Pneumonia due to Pseudomonas aeruginosa ([place])   . Syncope    Neurocardiogenic    Past Surgical History:  Procedure Laterality Date  . CARDIAC CATHETERIZATION    . ESOPHAGOGASTRODUODENOSCOPY (EGD) WITH ESOPHAGEAL DILATION    . INGUINAL HERNIA REPAIR    . Left orchiectomy    . VASECTOMY      Current Outpatient Medications  Medication Sig Dispense Refill  . acetaminophen (TYLENOL) 325 MG tablet Take 1-2 tablets (325-650 mg total) by mouth every 4 (four) hours as needed for mild pain.    Marland Kitchen albuterol (PROVENTIL) (2.5 MG/3ML) 0.083% nebulizer solution Take 3 mLs (2.5 mg total) by nebulization every 4 (four) hours as needed for wheezing or shortness of breath. 75 mL 12  . apixaban (ELIQUIS) 5 MG TABS tablet Place 1 tablet (5 mg total) into feeding tube 2 (two) times daily. 60 tablet 1  . bethanechol (URECHOLINE) 25 MG  tablet Take 1 tablet (25 mg total) by mouth 3 (three) times daily. 90 tablet 1  . budesonide (PULMICORT) 0.5 MG/2ML nebulizer solution Take 2 mLs (0.5 mg total) by nebulization 2 (two) times daily. 120 mL 12  . clonazePAM (KLONOPIN) 0.25 MG disintegrating tablet Take 1 tablet (0.25 mg total) by mouth every 8 (eight) hours. 60 tablet 0  . diltiazem (CARDIZEM CD) 240 MG 24 hr capsule Take 1 capsule (240 mg total) by mouth daily. 90 capsule 3  . ferrous [id number] C-folic acid (TRINSICON / FOLTRIN) capsule Take 1 capsule by mouth 2 (two) times daily before lunch and supper. 60 capsule 1  . folic acid (FOLVITE) 1 MG tablet Take 1 tablet (1 mg total) by mouth daily. 30 tablet 1  . guaiFENesin 200 MG tablet Take 1 tablet (200 mg total) by mouth every 6 (six) hours. 60 tablet 0  . isosorbide mononitrate (IMDUR) 30 MG 24 hr tablet Take 0.5 tablets (15 mg total) by mouth daily. 15 tablet 1  . Menthol-Methyl Salicylate (MUSCLE RUB) 10-15 % CREA Apply 1 application topically 2 (two) times daily after a meal.  0  . metoprolol tartrate (LOPRESSOR) 50 MG tablet Take 1 tablet (50 mg total) by mouth every 8 (eight) hours. 90 tablet 1  . pantoprazole (PROTONIX) 40 MG tablet Take 1 tablet (40 mg total) by mouth 2 (two) times daily. 60 tablet 1   No current facility-administered medications for this visit.   Allergies:  Patient has no known allergies.   Social History: The patient  reports that he quit smoking about 9 months ago. His smoking use included cigarettes. He started smoking about 55 years ago. He has a 53.00 pack-year smoking history. He has never used smokeless tobacco. He reports that he does not drink alcohol or use drugs.   Family History: The patient's family history includes Depression in his father; Hypercholesterolemia in his father; Hypertension in his brother, father, and mother; Melanoma in his mother.   ROS:  Please see the history of present illness. Otherwise, complete review of  systems is positive for {NONE DEFAULTED:18576::"none"}.  All other systems are reviewed and negative.   Physical Exam: VS:  There were no vitals taken for this visit., BMI There is no height or weight on file to calculate BMI.  Wt Readings from Last 3 Encounters:  08/15/18 141 lb (64 kg)  07/03/18 137 lb 12.8 oz (62.5 kg)  06/18/18 137 lb 12.8 oz (62.5 kg)    General: Patient appears comfortable at rest. HEENT: Conjunctiva and lids normal, oropharynx clear with moist mucosa. Neck: Supple, no elevated JVP or carotid bruits, no thyromegaly. Lungs: Clear to auscultation, nonlabored breathing at rest. Cardiac: Regular rate and rhythm, no S3 or significant systolic murmur, no pericardial rub. Abdomen: Soft, nontender, no hepatomegaly, bowel sounds present, no guarding or rebound. Extremities: No pitting edema, distal pulses 2+. Skin: Warm and dry. Musculoskeletal: No kyphosis. Neuropsychiatric: Alert and oriented x3, affect grossly appropriate.  ECG:  An ECG dated 02/15/2019 was personally reviewed today and demonstrated:  Sinus rhythm with frequent PACs, left atrial enlargement, nonspecific ST changes.  Recent Labwork: 04/22/2018: B Natriuretic Peptide 433.0 05/10/2018: TSH 6.159 05/29/2018: Magnesium 2.0 05/31/2018: ALT 15; AST 16 06/11/2018: BUN 19; Creatinine, Ser 1.04; Hemoglobin 9.6; Platelets 374; Potassium 3.9; Sodium 137     Component Value Date/Time   CHOL 79 05/04/2018 1800   TRIG 89 05/04/2018 1802   HDL 15 (L) 05/04/2018 1800   CHOLHDL 5.3 05/04/2018 1800   VLDL 17 05/04/2018 1800   LDLCALC 47 05/04/2018 1800    Other Studies Reviewed Today:  Echocardiogram 02/15/2019 [facility]): Summary  1. Technically difficult study due to chest wall/lung interference and  patient position.  2. The left ventricle is normal in size with mildly increased wall  thickness.  3. The left ventricular systolic function is moderately decreased, LVEF is  visually estimated at  35%.  4. There is grade I diastolic dysfunction (impaired relaxation).  5. There is mild mitral valve regurgitation.  6. There is mild to moderate aortic regurgitation.  7. The left atrium is mildly to moderately dilated in size.  8. The right ventricle is normal in size, with normal systolic function.  9. There is borderline pulmonary hypertension, estimated pulmonary artery  systolic pressure is 39 mmHg.   Chest x-ray 02/17/2019 [facility]): No acute cardiopulmonary disease with resolution of previous patchy lung opacities.  Assessment and Plan:   Medication Adjustments/Labs and Tests Ordered: Current medicines are reviewed at length with the patient today.  Concerns regarding medicines are outlined above.   Tests Ordered: No orders of the defined types were placed in this encounter.   Medication Changes: No orders of the defined types were placed in this encounter.   Disposition:  Follow up {follow up:15908}  Signed, Satira Sark, MD, [facility] 03/19/2019 1:55 PM    [place] at [facility] [address], [place], [place] [address] Phone: [phone];  Fax: [phone]

## 2019-03-22 ENCOUNTER — Encounter: Payer: Self-pay | Admitting: Cardiology

## 2019-03-22 DIAGNOSIS — U071 COVID-19: Secondary | ICD-10-CM | POA: Diagnosis not present

## 2019-04-19 ENCOUNTER — Other Ambulatory Visit: Payer: Self-pay | Admitting: Physician Assistant

## 2019-04-19 DIAGNOSIS — U071 COVID-19: Secondary | ICD-10-CM | POA: Diagnosis not present

## 2019-05-03 DIAGNOSIS — J449 Chronic obstructive pulmonary disease, unspecified: Secondary | ICD-10-CM | POA: Diagnosis not present

## 2019-05-03 DIAGNOSIS — I480 Paroxysmal atrial fibrillation: Secondary | ICD-10-CM | POA: Diagnosis not present

## 2019-05-03 DIAGNOSIS — I2510 Atherosclerotic heart disease of native coronary artery without angina pectoris: Secondary | ICD-10-CM | POA: Diagnosis not present

## 2019-05-03 DIAGNOSIS — I10 Essential (primary) hypertension: Secondary | ICD-10-CM | POA: Diagnosis not present

## 2019-05-20 DIAGNOSIS — U071 COVID-19: Secondary | ICD-10-CM | POA: Diagnosis not present

## 2019-05-29 ENCOUNTER — Encounter: Payer: Self-pay | Admitting: Cardiology

## 2019-05-29 ENCOUNTER — Other Ambulatory Visit: Payer: Self-pay

## 2019-05-29 ENCOUNTER — Ambulatory Visit (INDEPENDENT_AMBULATORY_CARE_PROVIDER_SITE_OTHER): Payer: BC Managed Care – PPO | Admitting: Cardiology

## 2019-05-29 VITALS — BP 122/70 | HR 72 | Ht 69.0 in | Wt 142.0 lb

## 2019-05-29 DIAGNOSIS — Z8679 Personal history of other diseases of the circulatory system: Secondary | ICD-10-CM

## 2019-05-29 DIAGNOSIS — I480 Paroxysmal atrial fibrillation: Secondary | ICD-10-CM

## 2019-05-29 DIAGNOSIS — I2510 Atherosclerotic heart disease of native coronary artery without angina pectoris: Secondary | ICD-10-CM

## 2019-05-29 NOTE — Progress Notes (Signed)
Cardiology Office Note  Date: 05/29/2019   ID: Kyle Lowery, DOB [birth year], MRN [id number]  PCP:  Rosita Fire, MD  Cardiologist:  Rozann Lesches, MD Electrophysiologist:  None   Chief Complaint  Patient presents with  . Cardiac follow-up    History of Present Illness: Kyle Lowery is a 69 y.o. male last seen in July 2020.  He presents for a routine visit.  Records indicate hospital stay at [facility] back in January with acute hypoxic respiratory failure associated with pneumonia, demand ischemia, also newly documented cardiomyopathy with LVEF 35%.  SARS coronavirus 2 test was negative at that time.  He was seen by cardiology at that facility.  He presents today reporting chronic fatigue, intermittent vertigo symptoms when he sits up or lays down, no palpitations or syncope.  I reviewed his medications which are outlined below and overall stable from a cardiac perspective.  He has continued on Eliquis, easy bruising but no major bleeding problems.  Heart rate is regular today.  Past Medical History:  Diagnosis Date  . Carotid artery stenosis   . COPD (chronic obstructive pulmonary disease) ([place])   . Coronary atherosclerosis of native coronary artery    Mild to moderate NOCAD with 80% ostial diagonal 1/12  . Depression    Prior suicide attempt  . Essential hypertension   . GERD (gastroesophageal reflux disease)    Esophageal dilatation  . Mixed hyperlipidemia   . Paroxysmal atrial fibrillation (HCC)   . Pneumonia due to Pseudomonas aeruginosa ([place])   . Syncope    Neurocardiogenic    Past Surgical History:  Procedure Laterality Date  . CARDIAC CATHETERIZATION    . ESOPHAGOGASTRODUODENOSCOPY (EGD) WITH ESOPHAGEAL DILATION    . INGUINAL HERNIA REPAIR    . Left orchiectomy    . VASECTOMY      Current Outpatient Medications  Medication Sig Dispense Refill  . acetaminophen (TYLENOL) 325 MG tablet Take 1-2 tablets (325-650 mg total) by mouth every 4  (four) hours as needed for mild pain.    Marland Kitchen albuterol (PROVENTIL) (2.5 MG/3ML) 0.083% nebulizer solution Take 3 mLs (2.5 mg total) by nebulization every 4 (four) hours as needed for wheezing or shortness of breath. 75 mL 12  . apixaban (ELIQUIS) 5 MG TABS tablet Place 1 tablet (5 mg total) into feeding tube 2 (two) times daily. 60 tablet 1  . bethanechol (URECHOLINE) 25 MG tablet Take 1 tablet (25 mg total) by mouth 3 (three) times daily. 90 tablet 1  . budesonide (PULMICORT) 0.5 MG/2ML nebulizer solution Take 2 mLs (0.5 mg total) by nebulization 2 (two) times daily. 120 mL 12  . diltiazem (CARDIZEM CD) 240 MG 24 hr capsule Take 1 capsule by mouth once daily 90 capsule 0  . ferrous [id number] C-folic acid (TRINSICON / FOLTRIN) capsule Take 1 capsule by mouth 2 (two) times daily before lunch and supper. 60 capsule 1  . folic acid (FOLVITE) 1 MG tablet Take 1 tablet (1 mg total) by mouth daily. 30 tablet 1  . isosorbide mononitrate (IMDUR) 30 MG 24 hr tablet Take 0.5 tablets (15 mg total) by mouth daily. 15 tablet 1  . metoprolol tartrate (LOPRESSOR) 50 MG tablet Take 1 tablet (50 mg total) by mouth every 8 (eight) hours. 90 tablet 1  . omeprazole (PRILOSEC) 20 MG capsule Take 20 mg by mouth daily.    Marland Kitchen PARoxetine (PAXIL) 20 MG tablet Take 20 mg by mouth daily.    . potassium chloride (KLOR-CON) 20 MEQ  packet Take 20 mEq by mouth daily.     No current facility-administered medications for this visit.   Allergies:  Patient has no known allergies.   ROS:  Negative except as outlined above.  Physical Exam: VS:  BP 122/70   Pulse 72   Ht 5\' 9"  (1.753 m)   Wt 142 lb (64.4 kg)   SpO2 96%   BMI 20.97 kg/m , BMI Body mass index is 20.97 kg/m.  Wt Readings from Last 3 Encounters:  05/29/19 142 lb (64.4 kg)  08/15/18 141 lb (64 kg)  07/03/18 137 lb 12.8 oz (62.5 kg)    General: Chronically ill-appearing male, no distress. HEENT: Conjunctiva and lids normal, wearing a mask. Neck:  Supple, no elevated JVP or carotid bruits, no thyromegaly. Lungs: Clear to auscultation, nonlabored breathing at rest. Cardiac: Regular rate and rhythm, no S3, soft systolic murmur. Abdomen: Soft, nontender, bowel sounds present. Extremities: No pitting edema, distal pulses 2+.  ECG:  An ECG dated 02/15/2019 was personally reviewed today and demonstrated:  Sinus rhythm with PACs, left atrial enlargement.  Recent Labwork: 05/31/2018: ALT 15; AST 16 06/11/2018: BUN 19; Creatinine, Ser 1.04; Hemoglobin 9.6; Platelets 374; Potassium 3.9; Sodium 137     Component Value Date/Time   CHOL 79 05/04/2018 1800   TRIG 89 05/04/2018 1802   HDL 15 (L) 05/04/2018 1800   CHOLHDL 5.3 05/04/2018 1800   VLDL 17 05/04/2018 1800   LDLCALC 47 05/04/2018 1800    Other Studies Reviewed Today:  Echocardiogram 04/23/2018: 1. The left ventricle has normal systolic function, with an ejection fraction of 55-60%. The cavity size was normal. Left ventricular diastolic Doppler parameters are indeterminate. No evidence of left ventricular regional wall motion abnormalities. 2. The right ventricle has normal systolic function. The cavity was normal. There is no increase in right ventricular wall thickness. Right ventricular systolic pressure could not be assessed. 3. The aortic valve is tricuspid Mild thickening of the aortic valve Mild calcification of the aortic valve. Aortic valve regurgitation is trivial by color flow Doppler.. Mild aortic annular calcification noted. 4. The mitral valve is grossly normal.  Assessment and Plan:  1.  Paroxysmal atrial fibrillation and atrial flutter, CHA2DS2-VASc score of at least 3.  He does not report any active palpitations and heart rate is regular today.  For now we will continue Eliquis, Cardizem CD, and Lopressor.  Follow-up CBC and BMET.  2.  History of cardiomyopathy based on work-up at [facility] back in January, reportedly LVEF approximately 35%.  Previously LVEF  normal at 55 to 60% in March 2020.  We will obtain a follow-up echocardiogram.  3.  History of mild to moderate multivessel CAD, plan to continue medical therapy at this point along with observation in the absence of progressive angina symptoms.  He is not on aspirin given use of Eliquis.  Continue beta-blocker and Imdur.  Medication Adjustments/Labs and Tests Ordered: Current medicines are reviewed at length with the patient today.  Concerns regarding medicines are outlined above.   Tests Ordered: Orders Placed This Encounter  Procedures  . CBC  . Basic metabolic panel  . ECHOCARDIOGRAM COMPLETE    Medication Changes: No orders of the defined types were placed in this encounter.   Disposition:  Follow up test results and determine next step.  Signed, Satira Sark, MD, [facility] 05/29/2019 1:26 PM     [place] at [place], [place],  [address] Phone: [phone]; Fax: [phone])  [phone] 

## 2019-05-29 NOTE — Patient Instructions (Signed)
Medication Instructions:  Continue all current medications.  Labwork: CBC, BMET - orders given today.  Testing/Procedures: Your physician has requested that you have an echocardiogram. Echocardiography is a painless test that uses sound waves to create images of your heart. It provides your doctor with information about the size and shape of your heart and how well your heart's chambers and valves are working. This procedure takes approximately one hour. There are no restrictions for this procedure.  Follow-Up:  Office will contact with results via phone or letter.    Follow up pending test results.   Any Other Special Instructions Will Be Listed Below (If Applicable).  If you need a refill on your cardiac medications before your next appointment, please call your pharmacy.

## 2019-05-30 ENCOUNTER — Other Ambulatory Visit: Payer: Self-pay | Admitting: Cardiology

## 2019-05-30 DIAGNOSIS — I429 Cardiomyopathy, unspecified: Secondary | ICD-10-CM

## 2019-05-30 DIAGNOSIS — I480 Paroxysmal atrial fibrillation: Secondary | ICD-10-CM

## 2019-05-30 DIAGNOSIS — I2510 Atherosclerotic heart disease of native coronary artery without angina pectoris: Secondary | ICD-10-CM | POA: Diagnosis not present

## 2019-06-03 ENCOUNTER — Telehealth: Payer: Self-pay | Admitting: *Deleted

## 2019-06-03 NOTE — Telephone Encounter (Signed)
-----   Message from Satira Sark, MD sent at 05/31/2019 11:07 AM EDT ----- Results reviewed.  Hemoglobin and renal function are stable.  Continue current medications and follow-up plan.

## 2019-06-14 NOTE — Telephone Encounter (Signed)
Patient informed. Copy sent to PCP °

## 2019-06-19 DIAGNOSIS — U071 COVID-19: Secondary | ICD-10-CM | POA: Diagnosis not present

## 2019-06-20 ENCOUNTER — Ambulatory Visit (INDEPENDENT_AMBULATORY_CARE_PROVIDER_SITE_OTHER): Payer: BC Managed Care – PPO

## 2019-06-20 ENCOUNTER — Telehealth: Payer: Self-pay | Admitting: *Deleted

## 2019-06-20 ENCOUNTER — Other Ambulatory Visit: Payer: Self-pay

## 2019-06-20 DIAGNOSIS — I480 Paroxysmal atrial fibrillation: Secondary | ICD-10-CM

## 2019-06-20 DIAGNOSIS — I429 Cardiomyopathy, unspecified: Secondary | ICD-10-CM

## 2019-06-20 NOTE — Telephone Encounter (Signed)
-----   Message from Satira Sark, MD sent at 06/20/2019  2:53 PM EDT ----- Results reviewed.  Follow-up echocardiogram shows that his LVEF is now normal at 60 to 65% as opposed to the reduced levels noted around his prior hospitalization earlier this year.  Aortic regurgitation has increased into moderate range however since last years echocardiogram.  Schedule a 32-month office follow-up and continue current medications for now.

## 2019-06-20 NOTE — Telephone Encounter (Signed)
Patient informed. Copy sent to PCP °

## 2019-07-20 DIAGNOSIS — U071 COVID-19: Secondary | ICD-10-CM | POA: Diagnosis not present

## 2019-07-29 ENCOUNTER — Other Ambulatory Visit: Payer: Self-pay | Admitting: Physical Medicine and Rehabilitation

## 2019-08-19 DIAGNOSIS — U071 COVID-19: Secondary | ICD-10-CM | POA: Diagnosis not present

## 2019-08-26 ENCOUNTER — Other Ambulatory Visit: Payer: Self-pay | Admitting: Internal Medicine

## 2019-08-26 DIAGNOSIS — Z0000 Encounter for general adult medical examination without abnormal findings: Secondary | ICD-10-CM | POA: Diagnosis not present

## 2019-08-26 DIAGNOSIS — Z87891 Personal history of nicotine dependence: Secondary | ICD-10-CM

## 2019-08-26 DIAGNOSIS — I10 Essential (primary) hypertension: Secondary | ICD-10-CM | POA: Diagnosis not present

## 2019-08-26 DIAGNOSIS — J449 Chronic obstructive pulmonary disease, unspecified: Secondary | ICD-10-CM | POA: Diagnosis not present

## 2019-08-26 DIAGNOSIS — Z23 Encounter for immunization: Secondary | ICD-10-CM | POA: Diagnosis not present

## 2019-08-26 DIAGNOSIS — F17210 Nicotine dependence, cigarettes, uncomplicated: Secondary | ICD-10-CM | POA: Diagnosis not present

## 2019-08-26 DIAGNOSIS — F339 Major depressive disorder, recurrent, unspecified: Secondary | ICD-10-CM | POA: Diagnosis not present

## 2019-08-27 DIAGNOSIS — Z0001 Encounter for general adult medical examination with abnormal findings: Secondary | ICD-10-CM | POA: Diagnosis not present

## 2019-08-27 DIAGNOSIS — Z79899 Other long term (current) drug therapy: Secondary | ICD-10-CM | POA: Diagnosis not present

## 2019-09-16 ENCOUNTER — Ambulatory Visit (HOSPITAL_COMMUNITY)
Admission: RE | Admit: 2019-09-16 | Discharge: 2019-09-16 | Disposition: A | Discharge status: Home / Self Care | Payer: BC Managed Care – PPO | Source: Ambulatory Visit | Attending: Internal Medicine | Admitting: Internal Medicine

## 2019-09-16 ENCOUNTER — Other Ambulatory Visit: Payer: Self-pay

## 2019-09-16 DIAGNOSIS — Z87891 Personal history of nicotine dependence: Secondary | ICD-10-CM | POA: Insufficient documentation

## 2019-09-16 DIAGNOSIS — J439 Emphysema, unspecified: Secondary | ICD-10-CM | POA: Diagnosis not present

## 2019-09-16 DIAGNOSIS — Z122 Encounter for screening for malignant neoplasm of respiratory organs: Secondary | ICD-10-CM | POA: Diagnosis not present

## 2019-09-16 DIAGNOSIS — J984 Other disorders of lung: Secondary | ICD-10-CM | POA: Diagnosis not present

## 2019-09-16 DIAGNOSIS — I700 Atherosclerosis of aorta: Secondary | ICD-10-CM | POA: Diagnosis not present

## 2019-09-19 DIAGNOSIS — U071 COVID-19: Secondary | ICD-10-CM | POA: Diagnosis not present

## 2019-10-20 DIAGNOSIS — U071 COVID-19: Secondary | ICD-10-CM | POA: Diagnosis not present

## 2019-10-29 ENCOUNTER — Other Ambulatory Visit: Payer: Self-pay | Admitting: Physician Assistant

## 2019-11-19 DIAGNOSIS — U071 COVID-19: Secondary | ICD-10-CM | POA: Diagnosis not present

## 2019-12-04 NOTE — Progress Notes (Addendum)
Cardiology Office Note  Date: 12/05/2019   ID: Kyle Lowery, DOB Nov 02, 1950, MRN [id number]  PCP:  Rosita Fire, MD  Cardiologist:  Rozann Lesches, MD Electrophysiologist:  None   Chief Complaint: Follow-up PAF  History of Present Illness: Kyle Lowery is a 69 y.o. male with a history of PAF, COPD, carotid artery stenosis, HTN, GERD, HLD.  Previous hospital stay at [facility] January 2021 with acute hypoxic respiratory failure associated with pneumonia, demand ischemia, newly documented cardiomyopathy with LVEF 35%.  He was seen by cardiology during that visit.  Last seen by Dr. Domenic Polite 05/29/2019 reporting chronic fatigue, intermittent vertigo upon sitting or laying down.  No palpitations or syncope.  Was continuing Eliquis with easy bruising but no major bleeding problems.  Heart rate was regular.  He was to continue Eliquis, Cardizem CD, Lopressor, with labs including CBC and BMP.  Follow-up echo was ordered.  No coronary artery disease symptoms noted.  He was continuing aspirin and Eliquis.  Continuing beta-blocker and Imdur.  Here today for 28-month follow-up.  He is continuing shortness of breath.  He continues to smoke with 53-year history of smoking.  States he cannot sense any palpitations or fluttering sensation in his chest.  States he has been feeling dizzy when going from a sitting to standing position and when returning from a standing or sitting position to lying down.  Denies any recent near syncopal or syncopal episodes.  Admits to significant snoring, periods of apnea and excessive daytime sleepiness.  States he has never been evaluated by a pulmonologist.  Recent echocardiogram showed improvement of EF from previous echo in January at [facility] his EF was 35%.  He had mild to moderate aortic regurgitation.  Most recent echo ordered by Dr. Domenic Polite 06/20/2019: EF of 60 to 65%.  Moderate aortic regurgitation.  Had a recent low-dose CT scan by PCP to screen for  lung CA.  He had biapical pleural/parenchymal scarring.  Mild paraseptal emphysematous changes at the lung apices.  Small calcified and noncalcified pulmonary nodules measuring up to 4.3 mm in posterior right lung apex.  Aortic atherosclerosis.   Past Medical History:  Diagnosis Date  . Carotid artery stenosis   . COPD (chronic obstructive pulmonary disease) ([place])   . Coronary atherosclerosis of native coronary artery    Mild to moderate NOCAD with 80% ostial diagonal 1/12  . Depression    Prior suicide attempt  . Essential hypertension   . GERD (gastroesophageal reflux disease)    Esophageal dilatation  . Mixed hyperlipidemia   . Paroxysmal atrial fibrillation (HCC)   . Pneumonia due to Pseudomonas aeruginosa ([place])   . Syncope    Neurocardiogenic    Past Surgical History:  Procedure Laterality Date  . CARDIAC CATHETERIZATION    . ESOPHAGOGASTRODUODENOSCOPY (EGD) WITH ESOPHAGEAL DILATION    . INGUINAL HERNIA REPAIR    . Left orchiectomy    . VASECTOMY      Current Outpatient Medications  Medication Sig Dispense Refill  . acetaminophen (TYLENOL) 325 MG tablet Take 1-2 tablets (325-650 mg total) by mouth every 4 (four) hours as needed for mild pain.    Marland Kitchen albuterol (PROVENTIL) (2.5 MG/3ML) 0.083% nebulizer solution Take 3 mLs (2.5 mg total) by nebulization every 4 (four) hours as needed for wheezing or shortness of breath. 75 mL 12  . apixaban (ELIQUIS) 5 MG TABS tablet Place 1 tablet (5 mg total) into feeding tube 2 (two) times daily. 60 tablet 1  . bethanechol (  URECHOLINE) 25 MG tablet Take 1 tablet (25 mg total) by mouth 3 (three) times daily. 90 tablet 1  . budesonide (PULMICORT) 0.5 MG/2ML nebulizer solution Take 2 mLs (0.5 mg total) by nebulization 2 (two) times daily. 120 mL 12  . diltiazem (CARDIZEM CD) 240 MG 24 hr capsule Take 1 capsule (240 mg total) by mouth daily. 90 capsule 0  . ferrous [id number] C-folic acid (TRINSICON / FOLTRIN) capsule Take 1 capsule by  mouth 2 (two) times daily before lunch and supper. 60 capsule 1  . folic acid (FOLVITE) 1 MG tablet Take 1 tablet (1 mg total) by mouth daily. 30 tablet 1  . isosorbide mononitrate (IMDUR) 30 MG 24 hr tablet Take 0.5 tablets (15 mg total) by mouth daily. 15 tablet 1  . metoprolol tartrate (LOPRESSOR) 50 MG tablet Take 1 tablet (50 mg total) by mouth every 8 (eight) hours. 90 tablet 1  . omeprazole (PRILOSEC) 20 MG capsule Take 20 mg by mouth daily.    Marland Kitchen PARoxetine (PAXIL) 20 MG tablet Take 20 mg by mouth daily.    . potassium chloride (KLOR-CON) 20 MEQ packet Take 20 mEq by mouth daily.     No current facility-administered medications for this visit.   Allergies:  Patient has no known allergies.   Social History: The patient  reports that he has been smoking cigarettes. He started smoking about 56 years ago. He has a 53.00 pack-year smoking history. He has never used smokeless tobacco. He reports that he does not drink alcohol and does not use drugs.   Family History: The patient's family history includes Depression in his father; Hypercholesterolemia in his father; Hypertension in his brother, father, and mother; Melanoma in his mother.   ROS:  Please see the history of present illness. Otherwise, complete review of systems is positive for none.  All other systems are reviewed and negative.   Physical Exam: VS:  BP (!) 120/58   Pulse 60   Ht 5\' 9"  (1.753 m)   Wt 137 lb 9.6 oz (62.4 kg)   SpO2 98%   BMI 20.32 kg/m , BMI Body mass index is 20.32 kg/m.  Wt Readings from Last 3 Encounters:  12/05/19 137 lb 9.6 oz (62.4 kg)  05/29/19 142 lb (64.4 kg)  08/15/18 141 lb (64 kg)    General: Patient appears comfortable at rest. Neck: Supple, no elevated JVP or carotid bruits, no thyromegaly. Lungs: Prolonged expiratory phase with faint expiratory wheezes in lung bases posteriorly nonlabored breathing at rest. Cardiac: Regular rate and rhythm, no S3 or significant systolic murmur, no  pericardial rub. Abdomen: Soft, nontender, no hepatomegaly, bowel sounds present, no guarding or rebound. Extremities: No pitting edema, distal pulses 2+. Skin: Warm and dry. Musculoskeletal: No kyphosis. Neuropsychiatric: Alert and oriented x3, affect grossly appropriate.  ECG:    Recent Labwork: No results found for requested labs within last 8760 hours.     Component Value Date/Time   CHOL 79 05/04/2018 1800   TRIG 89 05/04/2018 1802   HDL 15 (L) 05/04/2018 1800   CHOLHDL 5.3 05/04/2018 1800   VLDL 17 05/04/2018 1800   LDLCALC 47 05/04/2018 1800    Other Studies Reviewed Today:   Echocardiogram 06/20/2019 1. Left ventricular ejection fraction, by estimation, is 60 to 65%. The left ventricle has normal function. The left ventricle has no regional wall motion abnormalities. Left ventricular diastolic parameters were normal. Normal global longitudinal strain of -21.9% 2. Right ventricular systolic function is normal. The right ventricular  size is normal. There is mildly elevated pulmonary artery systolic pressure. The estimated right ventricular systolic pressure is 39.7 mmHg. 3. The mitral valve is grossly normal. Mild mitral valve regurgitation. 4. The aortic valve is tricuspid with mild annular calcification. Aortic valve regurgitation is moderate - increased since last study in March 2020. 5. The inferior vena cava is normal in size with greater than 50% respiratory variability, suggesting right atrial pressure of 3 mmHg.   Echocardiogram 02/15/2019 [facility] Summary  1. Technically difficult study due to chest wall/lung interference and  patient position.  2. The left ventricle is normal in size with mildly increased wall  thickness.  3. The left ventricular systolic function is moderately decreased, LVEF is  visually estimated at 35%.  4. There is grade I diastolic dysfunction (impaired relaxation).  5. There is mild mitral valve regurgitation.  6. There is  mildto moderate aortic regurgitation.  7. The left atrium is mildly to moderately dilated in size.  8. The right ventricle is normal in size, with normal systolic function.  9. There is borderline pulmonary hypertension, estimated pulmonary artery  systolic pressure is 39 mmHg.     Assessment and Plan:  1. Paroxysmal atrial fibrillation (HCC)   2. History of cardiomyopathy   3. CAD in native artery   4. Moderate aortic regurgitation    1. Paroxysmal atrial fibrillation (HCC) Denies any recent episodes of palpitations or arrhythmias.  Has chronic fatigue.  Heart rate is 60 today.  Decrease metoprolol to 25 mg p.o. 3 times daily.  Continue Eliquis 5 mg p.o. twice daily.  Continue Cardizem 240 mg daily.  2. History of cardiomyopathy Echocardiogram 06/20/2019 EF 60 to 65%, mild MR, moderate AR, compared to echocardiogram in January 2021 at [facility] where EF was 35%, G1 DD, mild MR, mild to moderate AR.  3. CAD in native artery No recent anginal symptoms.  Continue Imdur 30 mg daily.  4. Moderate aortic regurgitation Moderate AR noted on recent echocardiogram 06/20/2019.   5. SOB/ DOE Patient having increased dyspnea on exertion/shortness of breath.  Has a greater than 50+-pack-year smoking history.  Has never seen pulmonology.  Refer to pulmonology for evaluation and possible PFTs.  He continues to smoke.  Highly advised to stop.  6.  Suspected sleep apnea Reports significant history of snoring, periods of apnea, and excessive daytime sleepiness.  We are referring to pulmonology for shortness of breath and DOE.  Requesting they perform sleep study based on suspected sleep apnea.  7.  Dizziness Patient reporting dizziness whenever he goes from a lying to sitting to standing position or when going from a standing or sitting position to lying down.  Denies any near syncopal or syncopal episode.  Please get a 14-day ZIO monitor to check for symptomatic bradycardia, episodes of  PAF/atrial fibrillation burden.  Patient states he cannot sense when he goes into atrial fibrillation.  Medication Adjustments/Labs and Tests Ordered: Current medicines are reviewed at length with the patient today.  Concerns regarding medicines are outlined above.   Disposition: Follow-up with Dr. Domenic Polite or APP 8 weeks  Signed, Levell July, NP 12/05/2019 1:28 PM    Kyle Lowery at [address] [place], [place], [place] [address] Phone: [phone]; Fax: [phone]

## 2019-12-05 ENCOUNTER — Ambulatory Visit (INDEPENDENT_AMBULATORY_CARE_PROVIDER_SITE_OTHER): Payer: BC Managed Care – PPO | Admitting: Family Medicine

## 2019-12-05 ENCOUNTER — Ambulatory Visit (INDEPENDENT_AMBULATORY_CARE_PROVIDER_SITE_OTHER): Payer: BC Managed Care – PPO

## 2019-12-05 ENCOUNTER — Encounter: Payer: Self-pay | Admitting: Family Medicine

## 2019-12-05 VITALS — BP 120/58 | HR 60 | Ht 69.0 in | Wt 137.6 lb

## 2019-12-05 DIAGNOSIS — R0609 Other forms of dyspnea: Secondary | ICD-10-CM

## 2019-12-05 DIAGNOSIS — R42 Dizziness and giddiness: Secondary | ICD-10-CM

## 2019-12-05 DIAGNOSIS — I480 Paroxysmal atrial fibrillation: Secondary | ICD-10-CM

## 2019-12-05 DIAGNOSIS — R29818 Other symptoms and signs involving the nervous system: Secondary | ICD-10-CM

## 2019-12-05 DIAGNOSIS — R0600 Dyspnea, unspecified: Secondary | ICD-10-CM | POA: Diagnosis not present

## 2019-12-05 DIAGNOSIS — I2510 Atherosclerotic heart disease of native coronary artery without angina pectoris: Secondary | ICD-10-CM

## 2019-12-05 DIAGNOSIS — I351 Nonrheumatic aortic (valve) insufficiency: Secondary | ICD-10-CM | POA: Diagnosis not present

## 2019-12-05 DIAGNOSIS — Z8679 Personal history of other diseases of the circulatory system: Secondary | ICD-10-CM

## 2019-12-05 MED ORDER — METOPROLOL TARTRATE 25 MG PO TABS: 25.0000 mg | ORAL_TABLET | Freq: Three times a day (TID) | ORAL | 1 refills | Status: DC

## 2019-12-05 NOTE — Patient Instructions (Addendum)
Medication Instructions:   Your physician has recommended you make the following change in your medication:   Decrease your metoprolol tartrate to 25 mg by mouth three times daily  Continue other medications the same  Labwork:  None  Testing/Procedures: ZIO- Long Term Monitor Instructions   Your physician has requested you wear your ZIO patch monitor 14 days.   This is a single patch monitor.  [employer] supplies one patch monitor per enrollment.  Additional stickers are not available.   Please do not apply patch if you will be having a Nuclear Stress Test, Echocardiogram, Cardiac CT, MRI, or Chest Xray during the time frame you would be wearing the monitor. The patch cannot be worn during these tests.  You cannot remove and re-apply the ZIO XT patch monitor.   Your ZIO patch monitor will be sent USPS Priority mail from [facility] directly to your home address. The monitor may also be mailed to a PO BOX if home delivery is not available.   It may take 3-5 days to receive your monitor after you have been enrolled.   Once you have received you monitor, please review enclosed instructions.  Your monitor has already been registered assigning a specific monitor serial # to you.   Applying the monitor   Shave hair from upper left chest.   Hold abrader disc by orange tab.  Rub abrader in 40 strokes over left upper chest as indicated in your monitor instructions.   Clean area with 4 enclosed alcohol pads .  Use all pads to assure are is cleaned thoroughly.  Let dry.   Apply patch as indicated in monitor instructions.  Patch will be place under collarbone on left side of chest with arrow pointing upward.   Rub patch adhesive wings for 2 minutes.Remove white label marked "1".  Remove white label marked "2".  Rub patch adhesive wings for 2 additional minutes.   While looking in a mirror, press and release button in center of patch.  A small green light will flash 3-4 times .  This  will be your only indicator the monitor has been turned on.     Do not shower for the first 24 hours.  You may shower after the first 24 hours.   Press button if you feel a symptom. You will hear a small click.  Record Date, Time and Symptom in the Patient Log Book.   When you are ready to remove patch, follow instructions on last 2 pages of Patient Log Book.  Stick patch monitor onto last page of Patient Log Book.   Place Patient Log Book in [place] box.  Use locking tab on box and tape box closed securely.  The [employer] and [employer] has [employer] on it.  Please place in mailbox as soon as possible.  Your physician should have your test results approximately 7 days after the monitor has been mailed back to [facility].   Call [facility] at [phone] if you have questions regarding your ZIO XT patch monitor.  Call them immediately if you see an orange light blinking on your monitor.    If your monitor falls off in less than 4 days contact our Monitor department at [phone].  If your monitor becomes loose or falls off after 4 days call [employer] at [phone] for suggestions on securing your monitor.  Follow-Up:  Your physician recommends that you schedule a follow-up appointment in: 2 months.  You have been referred to [place].  Any Other Special Instructions Will Be Listed Below (If Applicable).  If you need a refill on your cardiac medications before your next appointment, please call your pharmacy.

## 2019-12-10 DIAGNOSIS — I480 Paroxysmal atrial fibrillation: Secondary | ICD-10-CM

## 2019-12-10 DIAGNOSIS — R42 Dizziness and giddiness: Secondary | ICD-10-CM

## 2019-12-20 DIAGNOSIS — U071 COVID-19: Secondary | ICD-10-CM | POA: Diagnosis not present

## 2019-12-23 ENCOUNTER — Ambulatory Visit: Payer: Medicare Other | Admitting: Cardiology

## 2020-01-08 DIAGNOSIS — I480 Paroxysmal atrial fibrillation: Secondary | ICD-10-CM | POA: Diagnosis not present

## 2020-01-08 DIAGNOSIS — R42 Dizziness and giddiness: Secondary | ICD-10-CM | POA: Diagnosis not present

## 2020-01-17 ENCOUNTER — Telehealth: Payer: Self-pay | Admitting: *Deleted

## 2020-01-17 NOTE — Telephone Encounter (Signed)
-----   Message from Massie Maroon, [place] sent at 01/13/2020 11:12 AM EST -----  ----- Message ----- From: Verta Ellen., NP Sent: 01/08/2020   6:06 PM EST To: Laurine Blazer, LPN  Please call the patient and let him know the monitor showed some frequent episodes of SVT but most of the episodes were brief. Had occasional episodes atrial fibrillation. He had one nearly 4 second pause on November 14 a little after 630 pm but it was not triggered by him. Another 3 second pause on November 9 at 130 in the afternoon. We may need to send him to see EP for evaluation. Dr Domenic Polite read the report. 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 Ask him if he remembers if he was having any symptoms around these two dates when he was having the pauses. Go ahead and refer him to EP to have them evaluate him. Thank You

## 2020-01-17 NOTE — Telephone Encounter (Signed)
Pt voiced understanding

## 2020-01-19 DIAGNOSIS — U071 COVID-19: Secondary | ICD-10-CM | POA: Diagnosis not present

## 2020-01-29 ENCOUNTER — Other Ambulatory Visit: Payer: Self-pay | Admitting: Physician Assistant

## 2020-01-29 NOTE — Telephone Encounter (Signed)
This is a [place] pt °

## 2020-02-17 ENCOUNTER — Ambulatory Visit (INDEPENDENT_AMBULATORY_CARE_PROVIDER_SITE_OTHER): Payer: BC Managed Care – PPO | Admitting: Cardiology

## 2020-02-17 ENCOUNTER — Encounter: Payer: Self-pay | Admitting: Cardiology

## 2020-02-17 ENCOUNTER — Other Ambulatory Visit: Payer: Self-pay

## 2020-02-17 VITALS — BP 130/64 | HR 65 | Ht 68.0 in | Wt 135.0 lb

## 2020-02-17 DIAGNOSIS — I480 Paroxysmal atrial fibrillation: Secondary | ICD-10-CM

## 2020-02-17 DIAGNOSIS — I6523 Occlusion and stenosis of bilateral carotid arteries: Secondary | ICD-10-CM | POA: Diagnosis not present

## 2020-02-17 DIAGNOSIS — I6529 Occlusion and stenosis of unspecified carotid artery: Secondary | ICD-10-CM | POA: Insufficient documentation

## 2020-02-17 DIAGNOSIS — I25119 Atherosclerotic heart disease of native coronary artery with unspecified angina pectoris: Secondary | ICD-10-CM | POA: Diagnosis not present

## 2020-02-17 MED ORDER — METOPROLOL TARTRATE 25 MG PO TABS: 25.0000 mg | ORAL_TABLET | Freq: Two times a day (BID) | ORAL | 2 refills | Status: AC

## 2020-02-17 NOTE — Patient Instructions (Addendum)
Medication Instructions:   Your physician has recommended you make the following change in your medication:   Decrease metoprolol tartrate to 25 mg by mouth twice daily  Continue other medications the same  Labwork:  none  Testing/Procedures: Your physician has requested that you have a carotid duplex. This test is an ultrasound of the carotid arteries in your neck. It looks at blood flow through these arteries that supply the brain with blood. Allow one hour for this exam. There are no restrictions or special instructions.  Follow-Up:  Your physician recommends that you schedule a follow-up appointment in: 6 months.  Any Other Special Instructions Will Be Listed Below (If Applicable).  If you need a refill on your cardiac medications before your next appointment, please call your pharmacy.

## 2020-02-17 NOTE — Progress Notes (Signed)
Cardiology Office Note  Date: 02/17/2020   ID: Kyle Lowery, DOB 07/17/1950, MRN [id number]  PCP:  Rosita Fire, MD  Cardiologist:  Rozann Lesches, MD Electrophysiologist:  None   Chief Complaint  Patient presents with  . Cardiac follow-up    History of Present Illness: Kyle Lowery is a 70 y.o. male last seen in October 2021 by Mr. Leonides Sake NP.  He presents for a routine visit.  Reports no sense of palpitations or exertional chest pain.  He does not describe any spontaneous bleeding problems on Eliquis.  I reviewed his medications which are outlined below.  He states that he has been forgetting to take the third dose of Lopressor at least a few days a week, for now we will simplify this to twice daily.  I personally reviewed his ECG which shows sinus bradycardia at 57 bpm.  He is due to see Dr. Legrand Rams for lab work.  Past Medical History:  Diagnosis Date  . Carotid artery stenosis   . COPD (chronic obstructive pulmonary disease) ([place])   . Coronary atherosclerosis of native coronary artery    Mild to moderate NOCAD with 80% ostial diagonal 1/12  . Depression    Prior suicide attempt  . Essential hypertension   . GERD (gastroesophageal reflux disease)    Esophageal dilatation  . Mixed hyperlipidemia   . Paroxysmal atrial fibrillation (HCC)   . Pneumonia due to Pseudomonas aeruginosa ([place])   . Syncope    Neurocardiogenic    Past Surgical History:  Procedure Laterality Date  . CARDIAC CATHETERIZATION    . ESOPHAGOGASTRODUODENOSCOPY (EGD) WITH ESOPHAGEAL DILATION    . INGUINAL HERNIA REPAIR    . Left orchiectomy    . VASECTOMY      Current Outpatient Medications  Medication Sig Dispense Refill  . albuterol (PROVENTIL) (2.5 MG/3ML) 0.083% nebulizer solution Take 3 mLs (2.5 mg total) by nebulization every 4 (four) hours as needed for wheezing or shortness of breath. 75 mL 12  . apixaban (ELIQUIS) 5 MG TABS tablet Place 1 tablet (5 mg total) into feeding tube 2  (two) times daily. 60 tablet 1  . budesonide (PULMICORT) 0.5 MG/2ML nebulizer solution Take 2 mLs (0.5 mg total) by nebulization 2 (two) times daily. 120 mL 12  . diltiazem (CARDIZEM CD) 240 MG 24 hr capsule Take 1 capsule by mouth once daily 90 capsule 1  . ferrous [id number] C-folic acid (TRINSICON / FOLTRIN) capsule Take 1 capsule by mouth 2 (two) times daily before lunch and supper. 60 capsule 1  . folic acid (FOLVITE) 1 MG tablet Take 1 tablet (1 mg total) by mouth daily. 30 tablet 1  . isosorbide mononitrate (IMDUR) 30 MG 24 hr tablet Take 0.5 tablets (15 mg total) by mouth daily. 15 tablet 1  . omeprazole (PRILOSEC) 20 MG capsule Take 20 mg by mouth daily.    Marland Kitchen PARoxetine (PAXIL) 20 MG tablet Take 20 mg by mouth daily.    . tamsulosin (FLOMAX) 0.4 MG CAPS capsule Take 0.4 mg by mouth daily.    . metoprolol tartrate (LOPRESSOR) 25 MG tablet Take 1 tablet (25 mg total) by mouth 2 (two) times daily. 180 tablet 2   No current facility-administered medications for this visit.   Allergies:  Patient has no known allergies.   ROS: No syncope.  Physical Exam: VS:  BP 130/64   Pulse 65   Ht 5\' 8"  (1.727 m)   Wt 135 lb (61.2 kg)   SpO2 91%  BMI 20.53 kg/m , BMI Body mass index is 20.53 kg/m.  Wt Readings from Last 3 Encounters:  02/17/20 135 lb (61.2 kg)  12/05/19 137 lb 9.6 oz (62.4 kg)  05/29/19 142 lb (64.4 kg)    General: Patient appears comfortable at rest. HEENT: Conjunctiva and lids normal, wearing a mask. Neck: Supple, no elevated JVP or carotid bruits, no thyromegaly. Lungs: Clear to auscultation, nonlabored breathing at rest. Cardiac: Regular rate and rhythm, no S3 or significant systolic murmur, no pericardial rub. Extremities: No pitting edema.  ECG:  An ECG dated 02/15/2019 was personally reviewed today and demonstrated:  Sinus rhythm with PACs, biatrial enlargement, nonspecific ST changes.  Recent Labwork:  April 2021: BUN 11, creatinine 0.98, potassium  4.1, hemoglobin 14.6, platelets 279 July 2021: Hemoglobin 13.4, platelets 241, AST 11, ALT 11, BUN 17, creatinine 1.23, cholesterol 148, triglycerides 227, HDL 25, LDL 78  Other Studies Reviewed Today:  Echocardiogram 06/20/2019: 1. Left ventricular ejection fraction, by estimation, is 60 to 65%. The  left ventricle has normal function. The left ventricle has no regional  wall motion abnormalities. Left ventricular diastolic parameters were  normal. Normal global longitudinal  strain of -21.9%  2. Right ventricular systolic function is normal. The right ventricular  size is normal. There is mildly elevated pulmonary artery systolic  pressure. The estimated right ventricular systolic pressure is 02.6 mmHg.  3. The mitral valve is grossly normal. Mild mitral valve regurgitation.  4. The aortic valve is tricuspid with mild annular calcification. Aortic  valve regurgitation is moderate - increased since last study in March  2020.  5. The inferior vena cava is normal in size with greater than 50%  respiratory variability, suggesting right atrial pressure of 3 mmHg.  Assessment and Plan:  1. Paroxysmal atrial fibrillation and flutter.  CHA2DS2-VASc score is at least 3.  He reports no palpitations.  Plan to continue Eliquis for stroke prophylaxis, he is due for follow-up lab work with Dr. Legrand Rams.  Otherwise decrease Lopressor to 25 mg twice daily and continue current dose of Cardizem CD.  2. Carotid artery disease, asymptomatic.  We discussed getting follow-up carotid Dopplers.  3.  Moderate multivessel CAD, asymptomatic in terms of palpitations.  ECG reviewed.  Continue observation on Lopressor and Imdur.  Medication Adjustments/Labs and Tests Ordered: Current medicines are reviewed at length with the patient today.  Concerns regarding medicines are outlined above.   Tests Ordered: Orders Placed This Encounter  Procedures  . EKG 12-Lead  . VAS US CAROTID    Medication  Changes: Meds ordered this encounter  Medications  . metoprolol tartrate (LOPRESSOR) 25 MG tablet    Sig: Take 1 tablet (25 mg total) by mouth 2 (two) times daily.    Dispense:  180 tablet    Refill:  2    02/17/2020 dose decrease    Disposition:  Follow up 6 months in the [place] office.  Signed, Satira Sark, MD, [facility] 02/17/2020 2:37 PM    [facility] at [place], [place], [place] [address] Phone: [phone]; Fax: [phone]

## 2020-02-18 DIAGNOSIS — J441 Chronic obstructive pulmonary disease with (acute) exacerbation: Secondary | ICD-10-CM | POA: Diagnosis not present

## 2020-02-18 DIAGNOSIS — F339 Major depressive disorder, recurrent, unspecified: Secondary | ICD-10-CM | POA: Diagnosis not present

## 2020-02-18 DIAGNOSIS — F17210 Nicotine dependence, cigarettes, uncomplicated: Secondary | ICD-10-CM | POA: Diagnosis not present

## 2020-02-18 DIAGNOSIS — I10 Essential (primary) hypertension: Secondary | ICD-10-CM | POA: Diagnosis not present

## 2020-02-18 DIAGNOSIS — I2510 Atherosclerotic heart disease of native coronary artery without angina pectoris: Secondary | ICD-10-CM | POA: Diagnosis not present

## 2020-02-19 ENCOUNTER — Ambulatory Visit (INDEPENDENT_AMBULATORY_CARE_PROVIDER_SITE_OTHER): Payer: BC Managed Care – PPO

## 2020-02-19 DIAGNOSIS — U071 COVID-19: Secondary | ICD-10-CM | POA: Diagnosis not present

## 2020-02-19 DIAGNOSIS — I6529 Occlusion and stenosis of unspecified carotid artery: Secondary | ICD-10-CM

## 2020-02-19 DIAGNOSIS — I6523 Occlusion and stenosis of bilateral carotid arteries: Secondary | ICD-10-CM

## 2020-03-21 DIAGNOSIS — U071 COVID-19: Secondary | ICD-10-CM | POA: Diagnosis not present

## 2020-04-01 IMAGING — CR PORTABLE CHEST - 1 VIEW
2 series · 2 of 2 positions shown · non-contrast
Comparison: April 17, 2018

CLINICAL DATA: High heart rate.

EXAM:
PORTABLE CHEST 1 VIEW

[portable (1 of 2)]
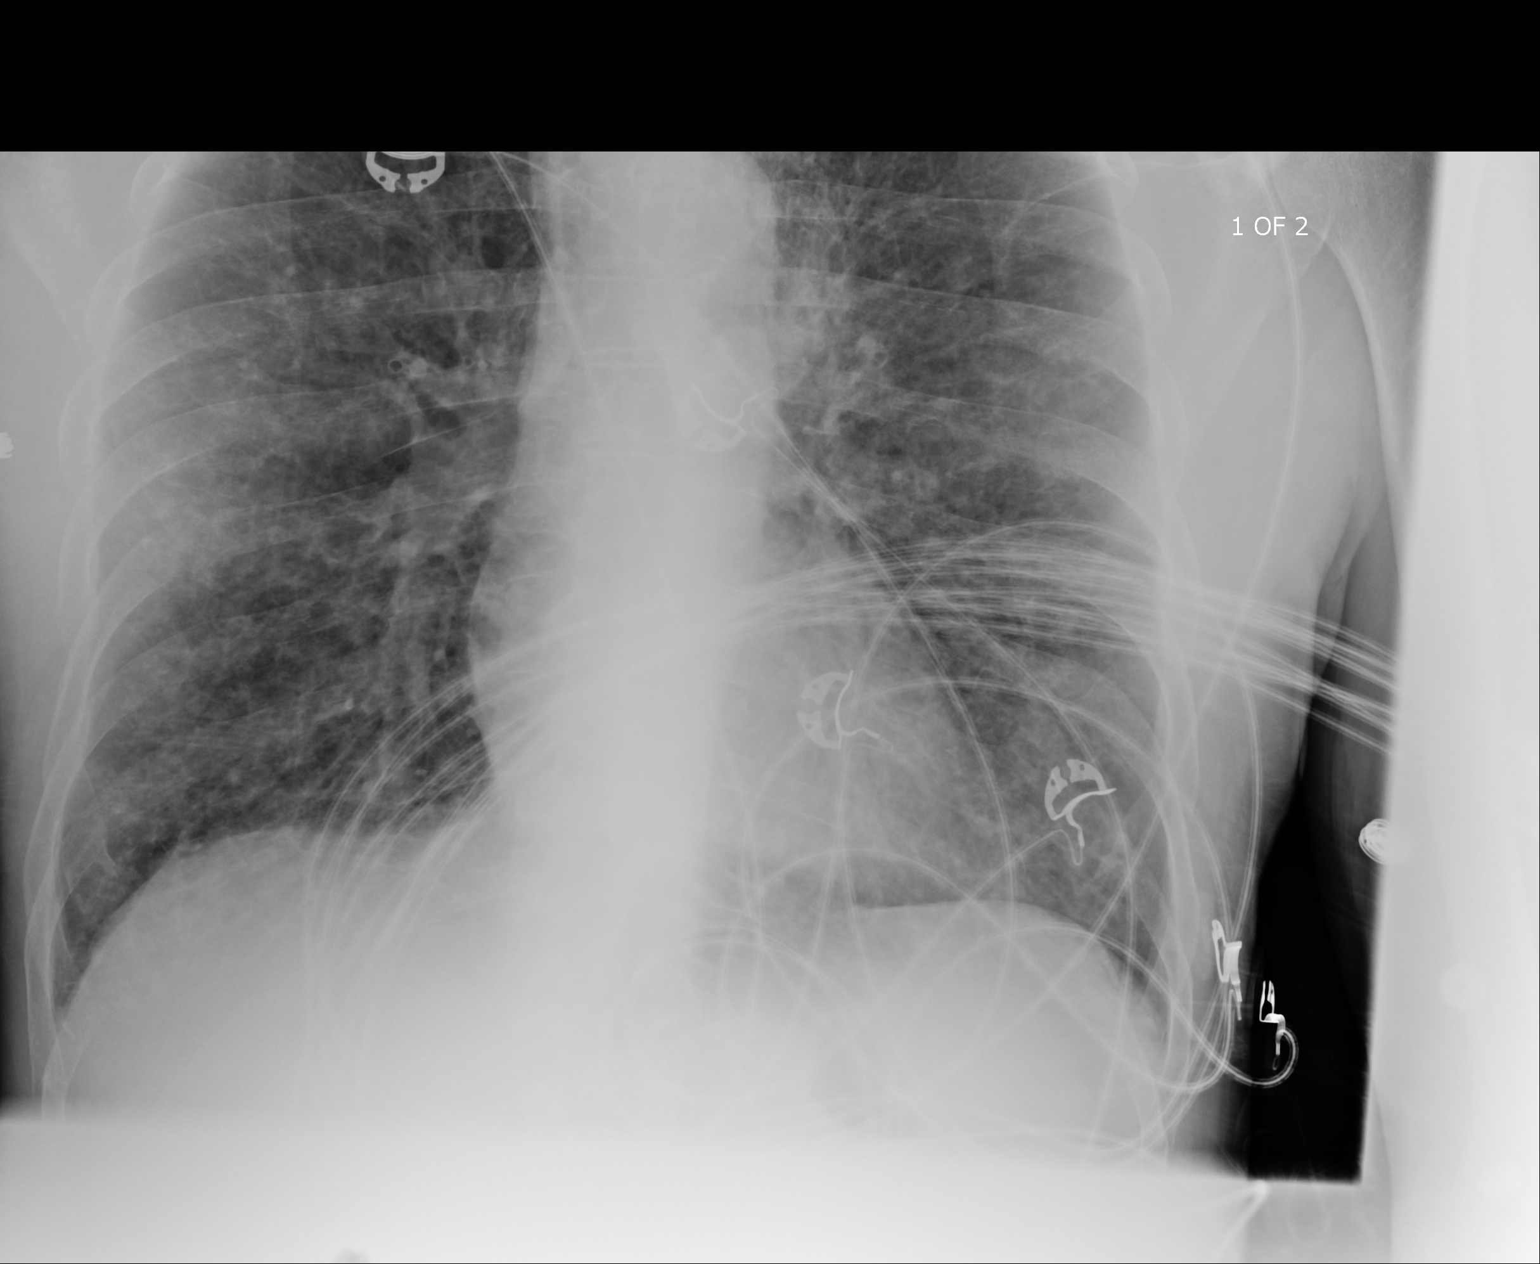

[portable (2 of 2)]
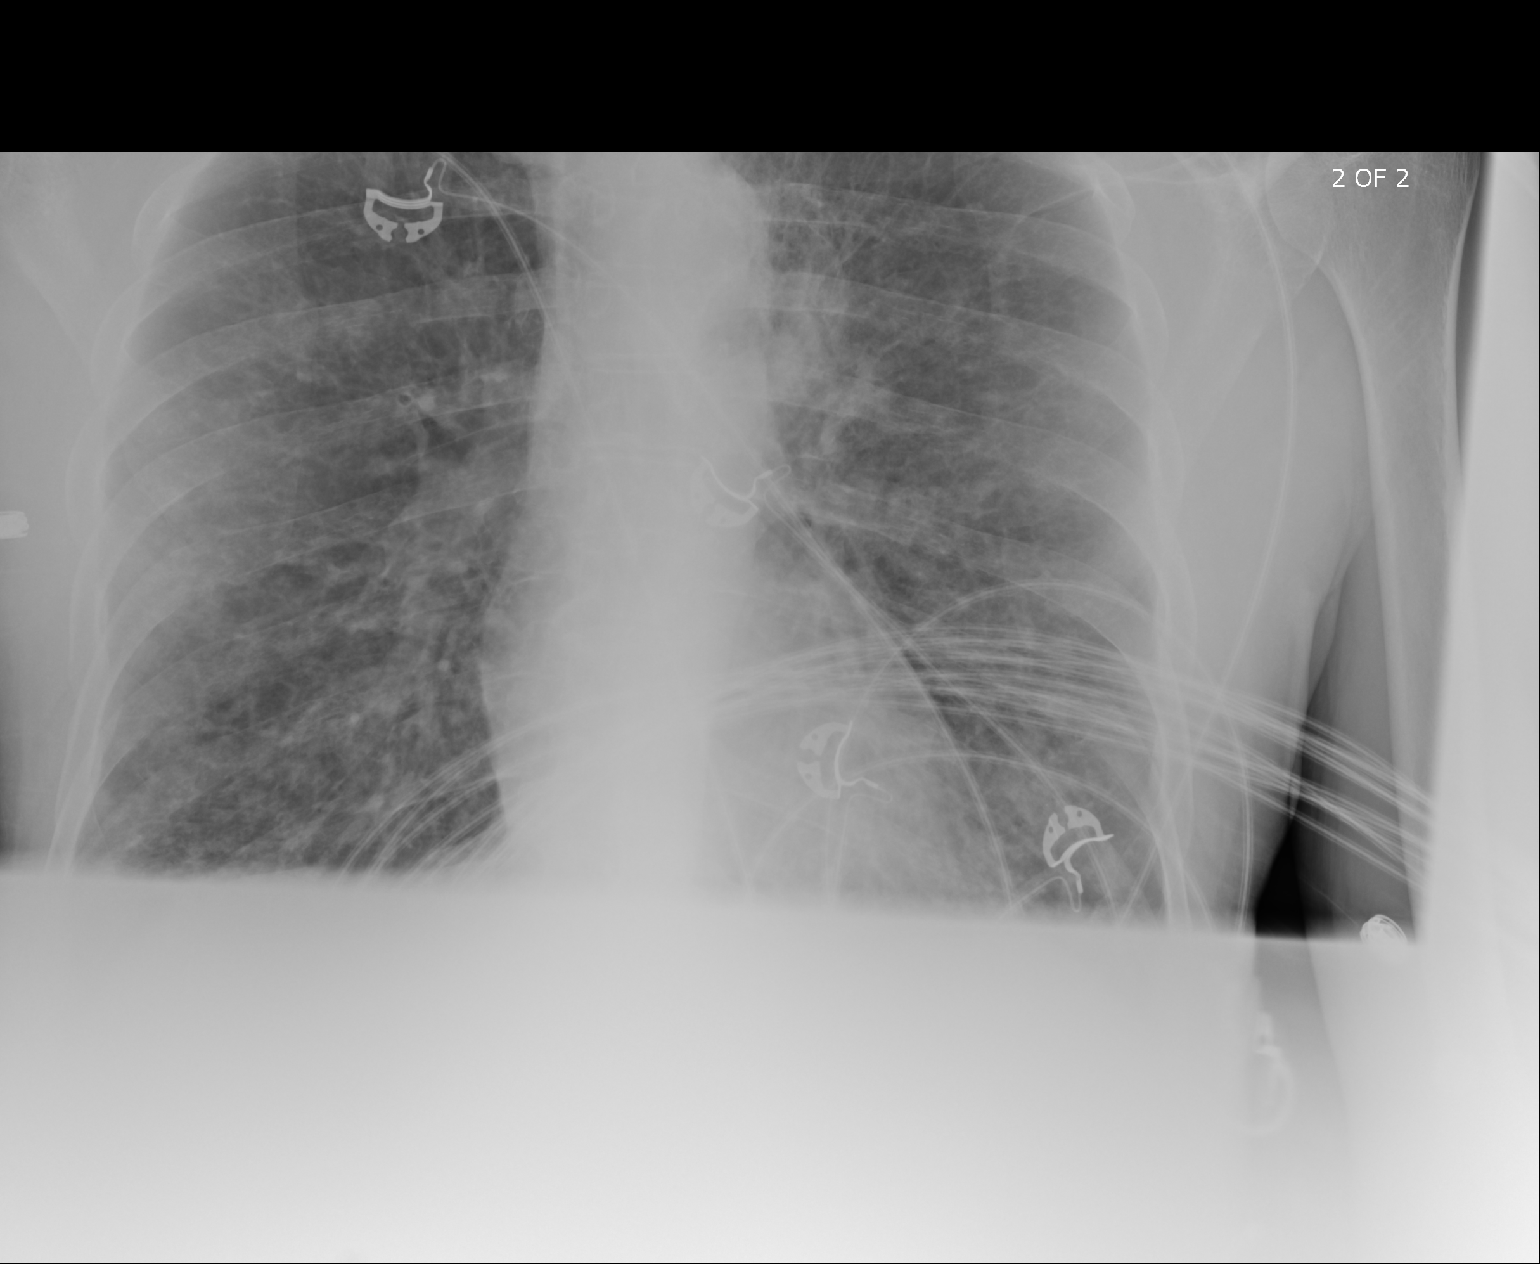

[2 of 2 positions shown; findings below may reference images not displayed]

FINDINGS: The heart size is normal. The hila and mediastinum are unremarkable.
Diffuse bilateral pulmonary interstitial opacities are identified.
No focal infiltrates, nodules, or masses. No pneumothorax. No other
acute abnormalities.
IMPRESSION: Diffuse interstitial opacities in the lungs are nonspecific. An
atypical infection/pneumonia should be considered. Pulmonary edema
is possible as well but there is no evidence of cardiomegaly.

## 2020-04-07 IMAGING — DX PORTABLE CHEST - 1 VIEW
1 series · 1 of 1 positions shown · non-contrast
Comparison: 04/26/2018

CLINICAL DATA: Respiration abnormal

EXAM:
PORTABLE CHEST 1 VIEW

[chest ap]
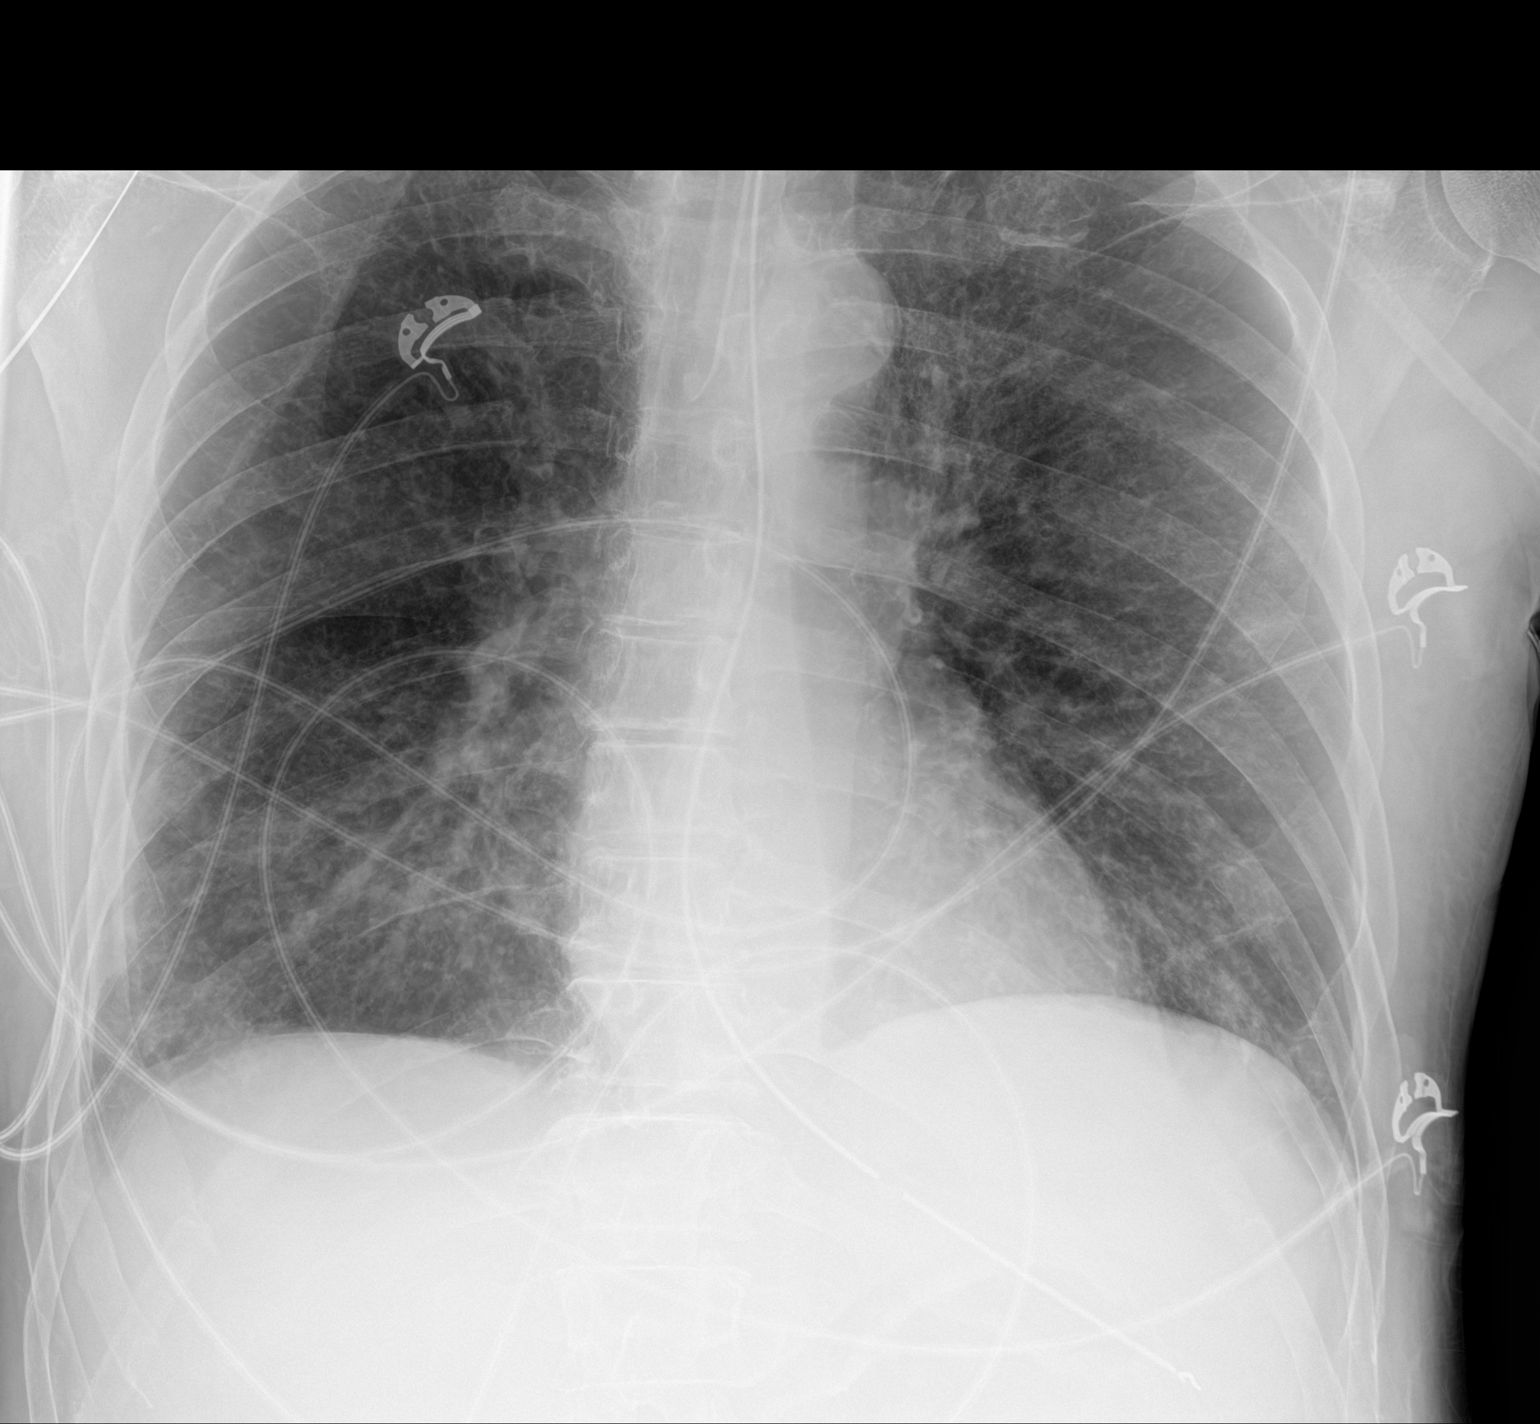

[1 of 1 positions shown; findings below may reference images not displayed]

FINDINGS: Endotracheal tube remains in good position.  NG in the stomach.

Diffusely prominent lung markings with mild interval improvement in
the bases. Small right pleural effusion. Heart size normal.
IMPRESSION: Mild improvement in bilateral diffuse airspace disease.

## 2020-04-07 IMAGING — DX PORTABLE CHEST - 1 VIEW
1 series · 1 of 1 positions shown · non-contrast
Comparison: Chest x-rays dated 04/28/2018 and 04/26/2018.

CLINICAL DATA: Respiratory failure

EXAM:
PORTABLE CHEST 1 VIEW

[chest]
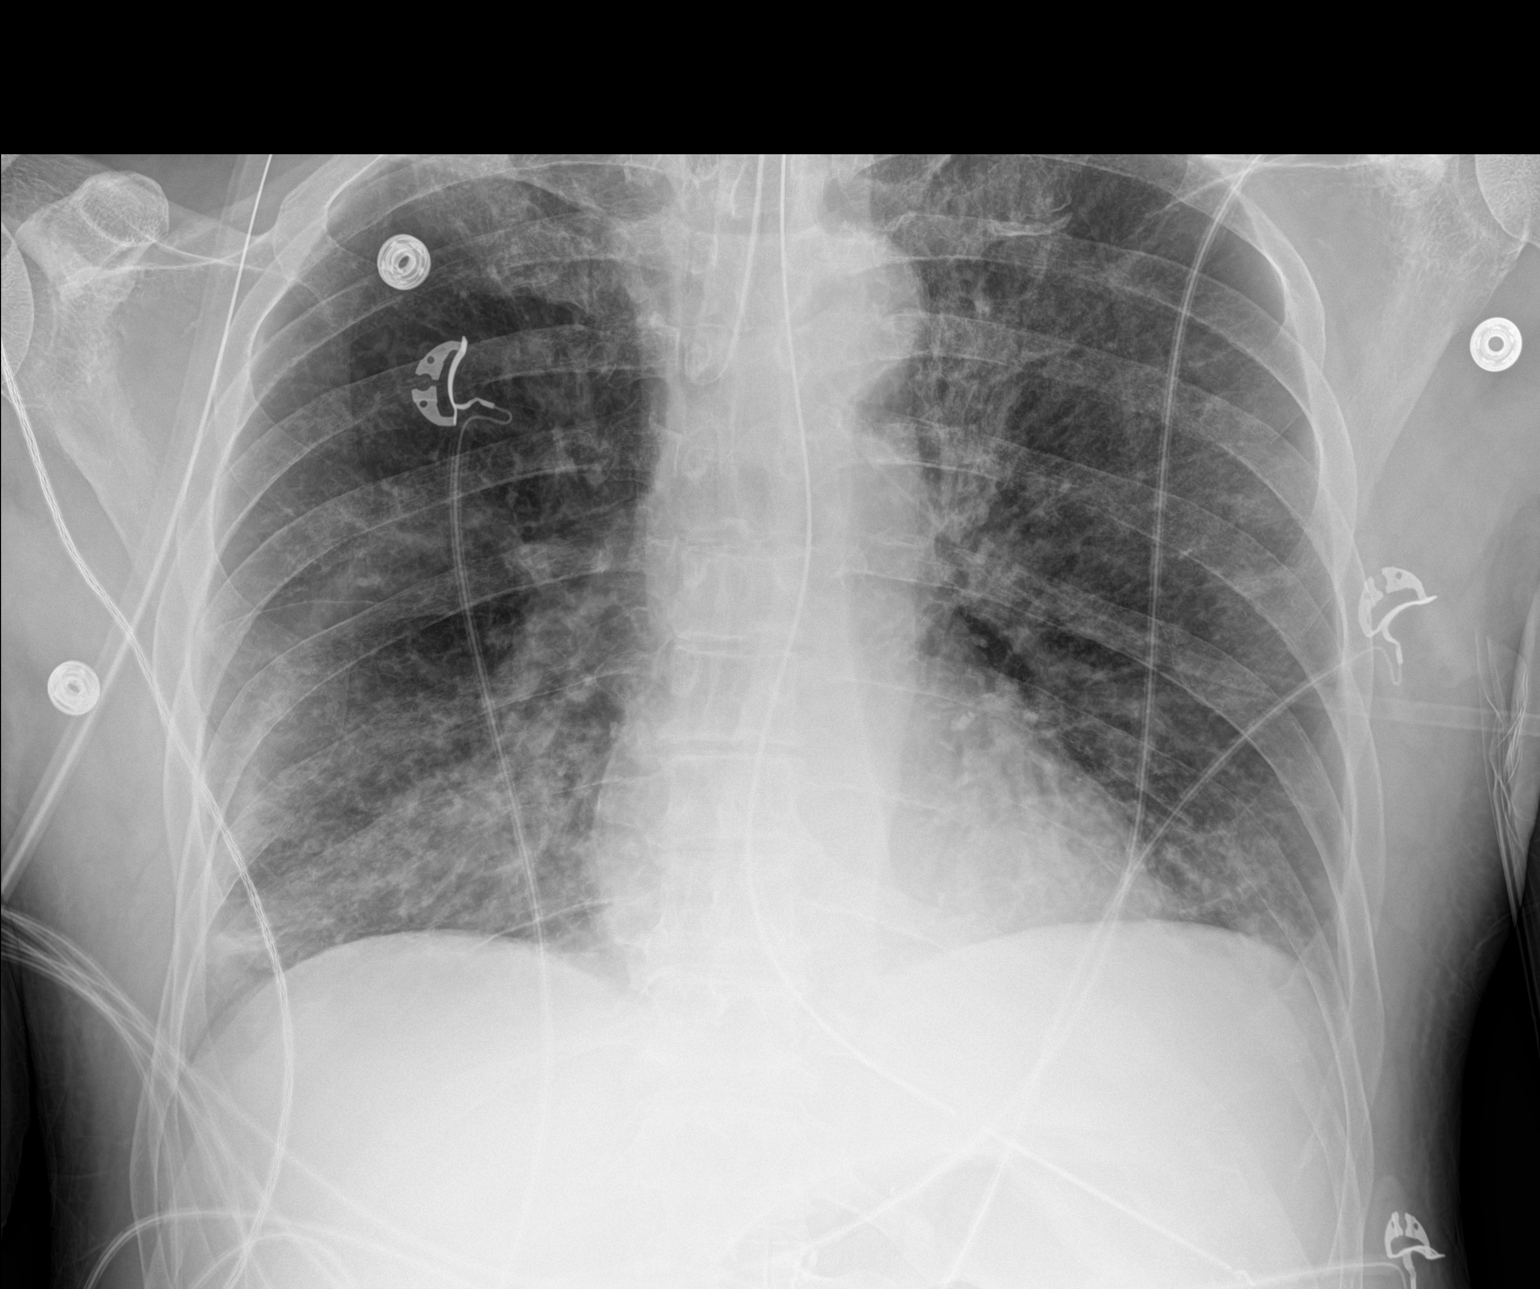

[1 of 1 positions shown; findings below may reference images not displayed]

FINDINGS: Endotracheal tube remains well positioned with tip approximately 3
cm above the carina. Enteric tube passes below the diaphragm. Hazy
opacities persist at the lung bases, RIGHT slightly greater than
LEFT, and suprahilar LEFT lung, similar to the previous studies. No
new lung findings. No pleural effusion or pneumothorax seen.
IMPRESSION: 1. Persistent hazy opacities at the lung bases, RIGHT slightly
greater than LEFT, and within the suprahilar LEFT lung, similar to
earlier studies, suggesting waxing and waning edema.
2. Support apparatus appears appropriately positioned.

## 2020-04-11 IMAGING — CR ABDOMEN - 1 VIEW
1 series · 1 of 1 positions shown · non-contrast
Comparison: May 01, 2018

CLINICAL DATA: Nasogastric tube placement

EXAM:
ABDOMEN - 1 VIEW

[AP]
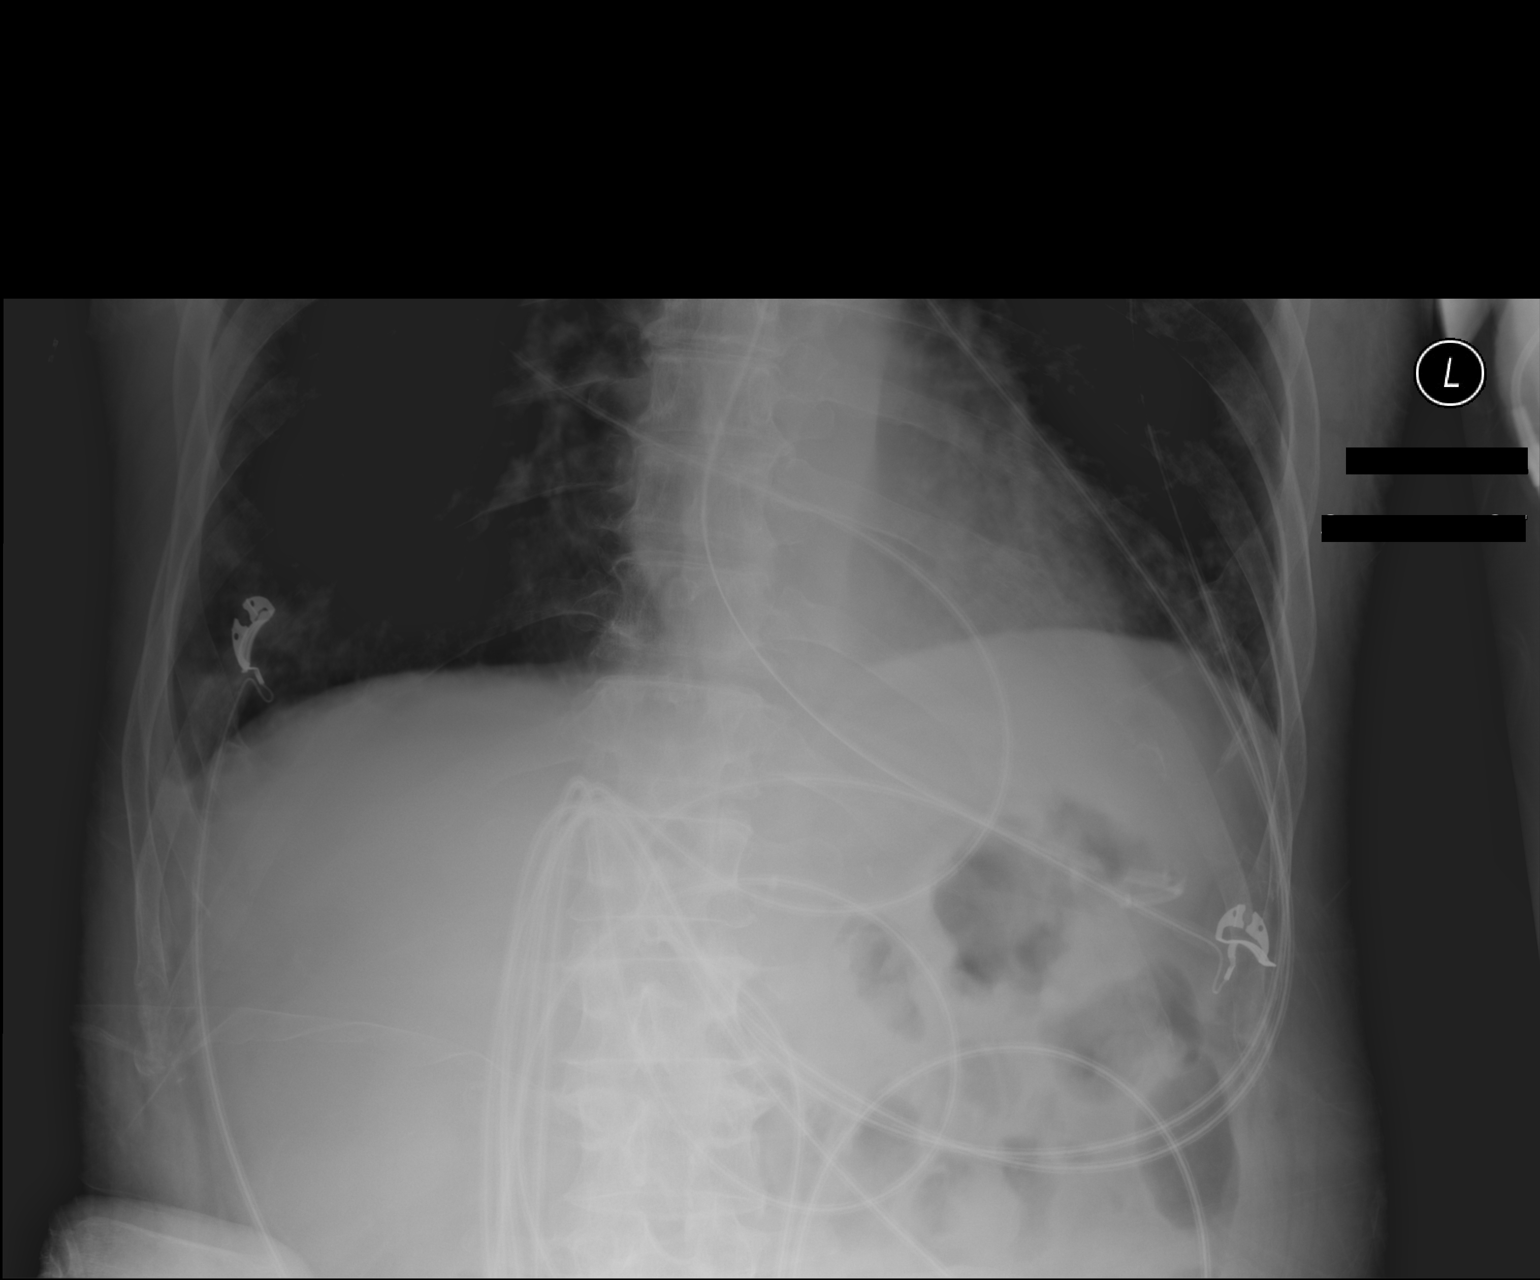

[1 of 1 positions shown; findings below may reference images not displayed]

FINDINGS: Nasogastric tube tip and side port are in the stomach. Visualized
bowel gas pattern is unremarkable. No demonstrable bowel obstruction
or free air. Lung bases clear.
IMPRESSION: Nasogastric tube tip and side port in stomach. Visualized bowel gas
pattern unremarkable. No evident free air.

## 2020-04-13 IMAGING — DX PORTABLE CHEST - 1 VIEW
1 series · 1 of 1 positions shown · non-contrast
Comparison: May 01, 2018

CLINICAL DATA: Respiratory failure

EXAM:
PORTABLE CHEST 1 VIEW

[chest]
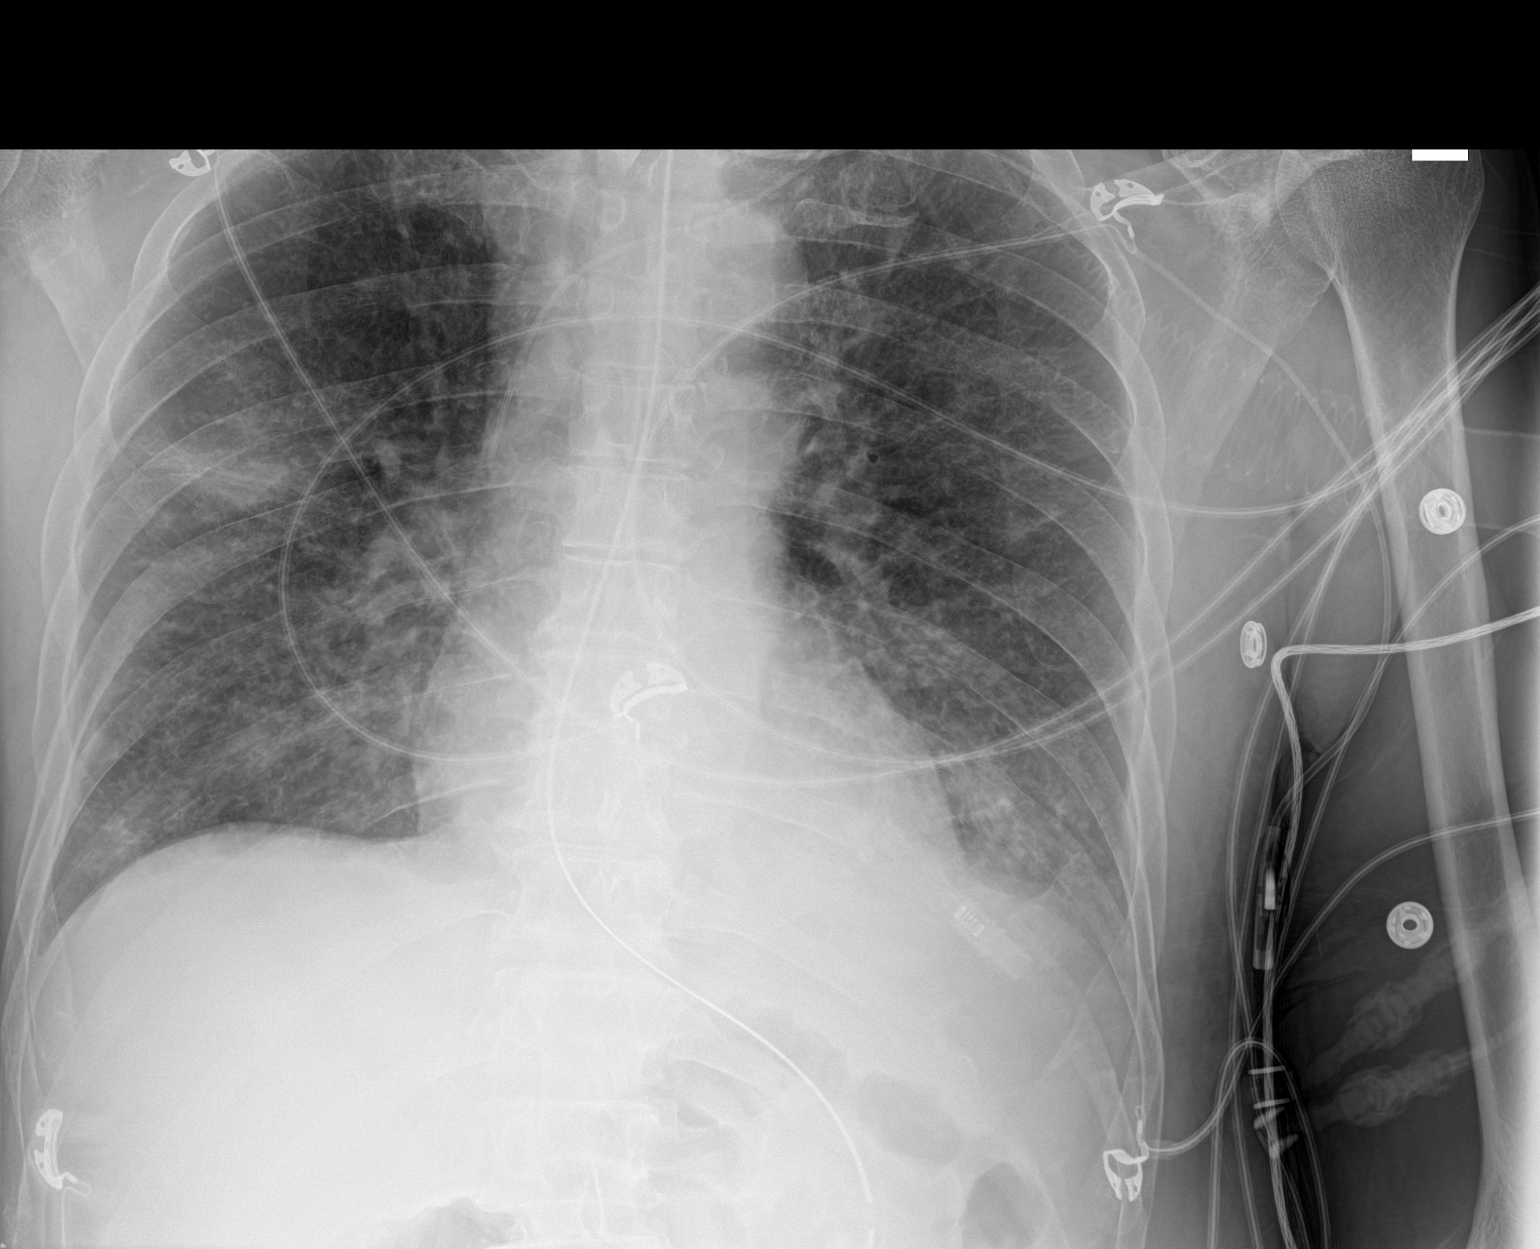

[1 of 1 positions shown; findings below may reference images not displayed]

FINDINGS: Tracheostomy catheter tip is 6.0 cm above the carina. Central
catheter tip is in the superior vena cava. Nasogastric tube tip and
side port are below the diaphragm. No pneumothorax. There is focal
airspace consolidation in the right mid lung. There is patchy
infiltrate in the left base. Lungs elsewhere are clear. Heart size
and pulmonary vascularity are normal. No adenopathy. No bone
lesions.
IMPRESSION: Tube and catheter positions as described without pneumothorax. Focal
airspace opacity felt to represent pneumonia in the right mid lung
and left base regions. Stable cardiac silhouette.

## 2020-04-18 DIAGNOSIS — U071 COVID-19: Secondary | ICD-10-CM | POA: Diagnosis not present

## 2020-05-18 DIAGNOSIS — J440 Chronic obstructive pulmonary disease with acute lower respiratory infection: Secondary | ICD-10-CM | POA: Diagnosis not present

## 2020-05-18 DIAGNOSIS — I2510 Atherosclerotic heart disease of native coronary artery without angina pectoris: Secondary | ICD-10-CM | POA: Diagnosis not present

## 2020-05-18 DIAGNOSIS — I10 Essential (primary) hypertension: Secondary | ICD-10-CM | POA: Diagnosis not present

## 2020-05-18 DIAGNOSIS — F17210 Nicotine dependence, cigarettes, uncomplicated: Secondary | ICD-10-CM | POA: Diagnosis not present

## 2020-05-18 DIAGNOSIS — F17200 Nicotine dependence, unspecified, uncomplicated: Secondary | ICD-10-CM | POA: Diagnosis not present

## 2020-05-19 DIAGNOSIS — U071 COVID-19: Secondary | ICD-10-CM | POA: Diagnosis not present

## 2020-06-18 DIAGNOSIS — U071 COVID-19: Secondary | ICD-10-CM | POA: Diagnosis not present

## 2020-07-14 ENCOUNTER — Other Ambulatory Visit (HOSPITAL_COMMUNITY): Payer: Self-pay | Admitting: Gerontology

## 2020-07-14 ENCOUNTER — Ambulatory Visit (HOSPITAL_COMMUNITY)
Admission: RE | Admit: 2020-07-14 | Discharge: 2020-07-14 | Disposition: A | Discharge status: Home / Self Care | Payer: BC Managed Care – PPO | Source: Ambulatory Visit | Attending: Gerontology | Admitting: Gerontology

## 2020-07-14 ENCOUNTER — Other Ambulatory Visit: Payer: Self-pay

## 2020-07-14 DIAGNOSIS — J441 Chronic obstructive pulmonary disease with (acute) exacerbation: Secondary | ICD-10-CM | POA: Diagnosis not present

## 2020-07-14 DIAGNOSIS — R0602 Shortness of breath: Secondary | ICD-10-CM

## 2020-07-14 DIAGNOSIS — F17200 Nicotine dependence, unspecified, uncomplicated: Secondary | ICD-10-CM | POA: Diagnosis not present

## 2020-07-14 DIAGNOSIS — I10 Essential (primary) hypertension: Secondary | ICD-10-CM | POA: Diagnosis not present

## 2020-07-14 DIAGNOSIS — J439 Emphysema, unspecified: Secondary | ICD-10-CM | POA: Diagnosis not present

## 2020-07-19 DIAGNOSIS — U071 COVID-19: Secondary | ICD-10-CM | POA: Diagnosis not present

## 2020-08-18 DIAGNOSIS — U071 COVID-19: Secondary | ICD-10-CM | POA: Diagnosis not present

## 2020-08-26 DIAGNOSIS — Z125 Encounter for screening for malignant neoplasm of prostate: Secondary | ICD-10-CM | POA: Diagnosis not present

## 2020-08-26 DIAGNOSIS — E7841 Elevated Lipoprotein(a): Secondary | ICD-10-CM | POA: Diagnosis not present

## 2020-08-26 DIAGNOSIS — F17210 Nicotine dependence, cigarettes, uncomplicated: Secondary | ICD-10-CM | POA: Diagnosis not present

## 2020-08-26 DIAGNOSIS — I10 Essential (primary) hypertension: Secondary | ICD-10-CM | POA: Diagnosis not present

## 2020-08-26 DIAGNOSIS — J449 Chronic obstructive pulmonary disease, unspecified: Secondary | ICD-10-CM | POA: Diagnosis not present

## 2020-08-26 DIAGNOSIS — F339 Major depressive disorder, recurrent, unspecified: Secondary | ICD-10-CM | POA: Diagnosis not present

## 2020-08-26 DIAGNOSIS — Z0001 Encounter for general adult medical examination with abnormal findings: Secondary | ICD-10-CM | POA: Diagnosis not present

## 2020-08-27 DIAGNOSIS — Z79899 Other long term (current) drug therapy: Secondary | ICD-10-CM | POA: Diagnosis not present

## 2020-09-03 ENCOUNTER — Encounter: Payer: Self-pay | Admitting: Internal Medicine

## 2020-09-18 DIAGNOSIS — U071 COVID-19: Secondary | ICD-10-CM | POA: Diagnosis not present

## 2020-10-19 DIAGNOSIS — U071 COVID-19: Secondary | ICD-10-CM | POA: Diagnosis not present

## 2020-11-10 ENCOUNTER — Telehealth: Payer: Self-pay | Admitting: *Deleted

## 2020-11-10 ENCOUNTER — Encounter: Payer: Self-pay | Admitting: Gastroenterology

## 2020-11-10 ENCOUNTER — Ambulatory Visit (INDEPENDENT_AMBULATORY_CARE_PROVIDER_SITE_OTHER): Payer: BC Managed Care – PPO | Admitting: Gastroenterology

## 2020-11-10 ENCOUNTER — Other Ambulatory Visit: Payer: Self-pay

## 2020-11-10 DIAGNOSIS — R634 Abnormal weight loss: Secondary | ICD-10-CM

## 2020-11-10 DIAGNOSIS — I6523 Occlusion and stenosis of bilateral carotid arteries: Secondary | ICD-10-CM

## 2020-11-10 DIAGNOSIS — R109 Unspecified abdominal pain: Secondary | ICD-10-CM

## 2020-11-10 NOTE — Progress Notes (Signed)
Primary Care Physician:  Rosita Fire, MD Referring Physician: Dr. Legrand Rams  Primary Gastroenterologist:  Dr. Abbey Chatters  Chief Complaint  Patient presents with   Weight Loss    Weighed 130's, has been down to 116. Last tcs <10 yrs ago. Feels like something is pressing against bladder. Pees a lot. Don't know he has to have bm sometimes until it's too late. Has been eating less. Stomach starts hurting when starts eating    HPI:   Kyle Lowery is a 70 y.o. male presenting today at the request of Dr. Legrand Rams due to weight loss. He has a remote history of EGD with dilation in 2005. Colonoscopy at that time with polyps but path not available.   Weight loss from the 130s. States he was in the 130s historically. Now 119. He states he can't hold his water (urine). States feels like something is pressing against bladder. Has BM every day. Runny. No hematochezia. States stool is black but on iron. Abdominal pain above umbilicus for "a good while". Takes Goody powders when it starts to hurt. Hurts in epigastric region. Feels like he is choking at times. Gets nauseated at times.   Omeprazole 20 mg daily.   Past Medical History:  Diagnosis Date   Carotid artery stenosis    COPD (chronic obstructive pulmonary disease) (HCC)    Coronary atherosclerosis of native coronary artery    Mild to moderate NOCAD with 80% ostial diagonal 1/12   Depression    Prior suicide attempt   Essential hypertension    GERD (gastroesophageal reflux disease)    Esophageal dilatation   Mixed hyperlipidemia    Paroxysmal atrial fibrillation (HCC)    Pneumonia due to Pseudomonas aeruginosa [facility])    Syncope    Neurocardiogenic    Past Surgical History:  Procedure Laterality Date   CARDIAC CATHETERIZATION     COLONOSCOPY  2005   3 polyps, no path available   ESOPHAGOGASTRODUODENOSCOPY (EGD) WITH ESOPHAGEAL DILATION  2005   normal esophagus s/p dilation, antral gastritis and pyloric channel inflamed appearing. no  path available   INGUINAL HERNIA REPAIR     Left orchiectomy     VASECTOMY      Current Outpatient Medications  Medication Sig Dispense Refill   albuterol (PROVENTIL) (2.5 MG/3ML) 0.083% nebulizer solution Take 3 mLs (2.5 mg total) by nebulization every 4 (four) hours as needed for wheezing or shortness of breath. 75 mL 12   apixaban (ELIQUIS) 5 MG TABS tablet Place 1 tablet (5 mg total) into feeding tube 2 (two) times daily. 60 tablet 1   budesonide (PULMICORT) 0.5 MG/2ML nebulizer solution Take 2 mLs (0.5 mg total) by nebulization 2 (two) times daily. 120 mL 12   diltiazem (CARDIZEM CD) 240 MG 24 hr capsule Take 1 capsule by mouth once daily 90 capsule 1   ferrous [id number] C-folic acid (TRINSICON / FOLTRIN) capsule Take 1 capsule by mouth 2 (two) times daily before lunch and supper. 60 capsule 1   folic acid (FOLVITE) 1 MG tablet Take 1 tablet (1 mg total) by mouth daily. 30 tablet 1   isosorbide mononitrate (IMDUR) 30 MG 24 hr tablet Take 0.5 tablets (15 mg total) by mouth daily. 15 tablet 1   metoprolol tartrate (LOPRESSOR) 25 MG tablet Take 1 tablet (25 mg total) by mouth 2 (two) times daily. 180 tablet 2   omeprazole (PRILOSEC) 20 MG capsule Take 20 mg by mouth daily.     PARoxetine (PAXIL) 20 MG tablet Take 20  mg by mouth daily.     tamsulosin (FLOMAX) 0.4 MG CAPS capsule Take 0.4 mg by mouth daily.     polyethylene glycol-electrolytes (NULYTELY) 420 g solution As directed 4000 mL 0   No current facility-administered medications for this visit.    Allergies as of 11/10/2020   (No Known Allergies)    Family History  Problem Relation Age of Onset   Hypertension Mother    Melanoma Mother    Depression Father        Suicide age 55   Hypercholesterolemia Father    Hypertension Father    Hypertension Brother    Lung disease Neg Hx    Colon cancer Neg Hx     Social History   Socioeconomic History   Marital status: Married    Spouse name: Not on file   Number  of children: Not on file   Years of education: Not on file   Highest education level: Not on file  Occupational History   Occupation: Disabled  Tobacco Use   Smoking status: Every Day    Packs/day: 1.00    Years: 53.00    Pack years: 53.00    Types: Cigarettes    Start date: 08/23/1963    Last attempt to quit: 06/08/2018    Years since quitting: 2.4   Smokeless tobacco: Never   Tobacco comments:    Peak rate of 2ppd  Vaping Use   Vaping Use: Not on file  Substance and Sexual Activity   Alcohol use: Yes    Comment: "drink one once in a while"   Drug use: Yes    Types: Marijuana   Sexual activity: Not on file  Other Topics Concern   Not on file  Social History Narrative   Disabled - mainly Architect    Married - third marriage   Tobacco Use - Yes.    1 child       Pulmonary (02/12/16):   Originally from [place]. He has lived in [place] most of his life. Previously has worked as a Dealer and also in Architect. Unsure of asbestos exposure. No mold exposure. Currently has an outside cat. No bird exposure. Denies any travel.    Social Determinants of Health   Financial Resource Strain: Not on file  Food Insecurity: Not on file  Transportation Needs: Not on file  Physical Activity: Not on file  Stress: Not on file  Social Connections: Not on file  Intimate Partner Violence: Not on file    Review of Systems: Gen: Denies any fever, chills, fatigue, weight loss, lack of appetite.  CV: Denies chest pain, heart palpitations, peripheral edema, syncope.  Resp: Denies shortness of breath at rest or with exertion. Denies wheezing or cough.  GI: see HPI GU : see HPI MS: Denies joint pain, muscle weakness, cramps, or limitation of movement.  Derm: Denies rash, itching, dry skin Psych: Denies depression, anxiety, memory loss, and confusion Heme: Denies bruising, bleeding, and enlarged lymph nodes.  Physical Exam: BP (!) 197/85   Pulse 77   Temp (!) 97.5 F (36.4 C)  (Temporal)   Ht 5\' 8"  (1.727 m)   Wt 119 lb 12.8 oz (54.3 kg)   BMI 18.22 kg/m  General:   Alert and oriented. Pleasant and cooperative. Well-nourished and well-developed.  Head:  Normocephalic and atraumatic. Eyes:  Without icterus, sclera clear and conjunctiva pink.  Ears:  Normal auditory acuity. Mouth:  No deformity or lesions, oral mucosa pink.  Lungs:  scattered mild wheeze  bilaterally Heart:  S1, S2 present with systolic murmur Abdomen:  +BS, soft, non-tender and non-distended. No HSM noted. No guarding or rebound. No masses appreciated.  Rectal:  Deferred  Msk:  Symmetrical without gross deformities. Normal posture. Extremities:  Without edema. Neurologic:  Alert and  oriented x4;  grossly normal neurologically. Skin:  Intact without significant lesions or rashes. Psych:  Alert and cooperative. Normal mood and affect.  ASSESSMENT: Kyle Lowery is a 70 y.o. male presenting today at the request of Dr. Legrand Rams due to unintentional weight loss. He is also reporting multiple symptoms including abdominal pain, dysphagia, and nausea.   Weight loss: unintended. No recent imaging on file. Will pursue CT abd/pelvis due to his constellation of symptoms. He is also reporting pressure against bladder and difficulty with urinary leakage. Referring to Urology for further evaluation.   Abdominal pain: reporting Goody powders. Suspect PUD. Less likely chronic mesenteric ischemia but need to keep in differentials. Will pursue EGD with dilation as well due to dysphagia. Last EGD with dilation in 2015. CT already planned due to abdominal pain.   Pursuing CT first. Will then pursue TCS/EGD/dilation. Will need to hold Eliquis X 48 hours prior.    PLAN:  CT abd/pelvis with contrast  Referral to Urology  Proceed with colonoscopy/EGD/dilation by Dr. Abbey Chatters  in near future: the risks, benefits, and alternatives have been discussed with the patient in detail. The patient states understanding and  desires to proceed.   HOLD ELIQUIS 48 hours prior  Continue omeprazole daily  Annitta Needs, PhD, [facility] Gastroenterology

## 2020-11-10 NOTE — Telephone Encounter (Signed)
CT A/P scheduled for 10/10 arrival 10:15am, npo 4 hrs prior, pick up oral prep from [place] pt. He is aware of of appt details. He voiced understanding

## 2020-11-10 NOTE — Patient Instructions (Signed)
We are arranging a CT scan in the near future. After that, we can pursue the colonoscopy, endoscopy, and dilation likely.   I would like to get a CT first due to many of your symptoms.  Further recommendations shortly!  It was a pleasure to see you today. I want to create trusting relationships with patients to provide genuine, compassionate, and quality care. I value your feedback. If you receive a survey regarding your visit,  I greatly appreciate you taking time to fill this out.   Annitta Needs, PhD, ANP-BC [facility] Gastroenterology

## 2020-11-10 NOTE — H&P (View-Only) (Signed)
Primary Care Physician:  Rosita Fire, MD Referring Physician: Dr. Legrand Rams  Primary Gastroenterologist:  Dr. Abbey Chatters  Chief Complaint  Patient presents with   Weight Loss    Weighed 130's, has been down to 116. Last tcs <10 yrs ago. Feels like something is pressing against bladder. Pees a lot. Don't know he has to have bm sometimes until it's too late. Has been eating less. Stomach starts hurting when starts eating    HPI:   Kyle Lowery is a 70 y.o. male presenting today at the request of Dr. Legrand Rams due to weight loss. He has a remote history of EGD with dilation in 2005. Colonoscopy at that time with polyps but path not available.   Weight loss from the 130s. States he was in the 130s historically. Now 119. He states he can't hold his water (urine). States feels like something is pressing against bladder. Has BM every day. Runny. No hematochezia. States stool is black but on iron. Abdominal pain above umbilicus for "a good while". Takes Goody powders when it starts to hurt. Hurts in epigastric region. Feels like he is choking at times. Gets nauseated at times.   Omeprazole 20 mg daily.   Past Medical History:  Diagnosis Date   Carotid artery stenosis    COPD (chronic obstructive pulmonary disease) (HCC)    Coronary atherosclerosis of native coronary artery    Mild to moderate NOCAD with 80% ostial diagonal 1/12   Depression    Prior suicide attempt   Essential hypertension    GERD (gastroesophageal reflux disease)    Esophageal dilatation   Mixed hyperlipidemia    Paroxysmal atrial fibrillation (HCC)    Pneumonia due to Pseudomonas aeruginosa [facility])    Syncope    Neurocardiogenic    Past Surgical History:  Procedure Laterality Date   CARDIAC CATHETERIZATION     COLONOSCOPY  2005   3 polyps, no path available   ESOPHAGOGASTRODUODENOSCOPY (EGD) WITH ESOPHAGEAL DILATION  2005   normal esophagus s/p dilation, antral gastritis and pyloric channel inflamed appearing. no  path available   INGUINAL HERNIA REPAIR     Left orchiectomy     VASECTOMY      Current Outpatient Medications  Medication Sig Dispense Refill   albuterol (PROVENTIL) (2.5 MG/3ML) 0.083% nebulizer solution Take 3 mLs (2.5 mg total) by nebulization every 4 (four) hours as needed for wheezing or shortness of breath. 75 mL 12   apixaban (ELIQUIS) 5 MG TABS tablet Place 1 tablet (5 mg total) into feeding tube 2 (two) times daily. 60 tablet 1   budesonide (PULMICORT) 0.5 MG/2ML nebulizer solution Take 2 mLs (0.5 mg total) by nebulization 2 (two) times daily. 120 mL 12   diltiazem (CARDIZEM CD) 240 MG 24 hr capsule Take 1 capsule by mouth once daily 90 capsule 1   ferrous [id number] C-folic acid (TRINSICON / FOLTRIN) capsule Take 1 capsule by mouth 2 (two) times daily before lunch and supper. 60 capsule 1   folic acid (FOLVITE) 1 MG tablet Take 1 tablet (1 mg total) by mouth daily. 30 tablet 1   isosorbide mononitrate (IMDUR) 30 MG 24 hr tablet Take 0.5 tablets (15 mg total) by mouth daily. 15 tablet 1   metoprolol tartrate (LOPRESSOR) 25 MG tablet Take 1 tablet (25 mg total) by mouth 2 (two) times daily. 180 tablet 2   omeprazole (PRILOSEC) 20 MG capsule Take 20 mg by mouth daily.     PARoxetine (PAXIL) 20 MG tablet Take 20  mg by mouth daily.     tamsulosin (FLOMAX) 0.4 MG CAPS capsule Take 0.4 mg by mouth daily.     polyethylene glycol-electrolytes (NULYTELY) 420 g solution As directed 4000 mL 0   No current facility-administered medications for this visit.    Allergies as of 11/10/2020   (No Known Allergies)    Family History  Problem Relation Age of Onset   Hypertension Mother    Melanoma Mother    Depression Father        Suicide age 33   Hypercholesterolemia Father    Hypertension Father    Hypertension Brother    Lung disease Neg Hx    Colon cancer Neg Hx     Social History   Socioeconomic History   Marital status: Married    Spouse name: Not on file   Number  of children: Not on file   Years of education: Not on file   Highest education level: Not on file  Occupational History   Occupation: Disabled  Tobacco Use   Smoking status: Every Day    Packs/day: 1.00    Years: 53.00    Pack years: 53.00    Types: Cigarettes    Start date: 08/23/1963    Last attempt to quit: 06/08/2018    Years since quitting: 2.4   Smokeless tobacco: Never   Tobacco comments:    Peak rate of 2ppd  Vaping Use   Vaping Use: Not on file  Substance and Sexual Activity   Alcohol use: Yes    Comment: "drink one once in a while"   Drug use: Yes    Types: Marijuana   Sexual activity: Not on file  Other Topics Concern   Not on file  Social History Narrative   Disabled - mainly Architect    Married - third marriage   Tobacco Use - Yes.    1 child      [place] [facility] (02/12/16):   Originally from [place]. He has lived in [place] most of his life. Previously has worked as a Dealer and also in Architect. Unsure of asbestos exposure. No mold exposure. Currently has an outside cat. No bird exposure. Denies any travel.    Social Determinants of Health   Financial Resource Strain: Not on file  Food Insecurity: Not on file  Transportation Needs: Not on file  Physical Activity: Not on file  Stress: Not on file  Social Connections: Not on file  Intimate Partner Violence: Not on file    Review of Systems: Gen: Denies any fever, chills, fatigue, weight loss, lack of appetite.  CV: Denies chest pain, heart palpitations, peripheral edema, syncope.  Resp: Denies shortness of breath at rest or with exertion. Denies wheezing or cough.  GI: see HPI GU : see HPI MS: Denies joint pain, muscle weakness, cramps, or limitation of movement.  Derm: Denies rash, itching, dry skin Psych: Denies depression, anxiety, memory loss, and confusion Heme: Denies bruising, bleeding, and enlarged lymph nodes.  Physical Exam: BP (!) 197/85   Pulse 77   Temp (!) 97.5 F (36.4 C)  (Temporal)   Ht 5\' 8"  (1.727 m)   Wt 119 lb 12.8 oz (54.3 kg)   BMI 18.22 kg/m  General:   Alert and oriented. Pleasant and cooperative. Well-nourished and well-developed.  Head:  Normocephalic and atraumatic. Eyes:  Without icterus, sclera clear and conjunctiva pink.  Ears:  Normal auditory acuity. Mouth:  No deformity or lesions, oral mucosa pink.  Lungs:  scattered mild wheeze  bilaterally Heart:  S1, S2 present with systolic murmur Abdomen:  +BS, soft, non-tender and non-distended. No HSM noted. No guarding or rebound. No masses appreciated.  Rectal:  Deferred  Msk:  Symmetrical without gross deformities. Normal posture. Extremities:  Without edema. Neurologic:  Alert and  oriented x4;  grossly normal neurologically. Skin:  Intact without significant lesions or rashes. Psych:  Alert and cooperative. Normal mood and affect.  ASSESSMENT: Kyle Lowery is a 70 y.o. male presenting today at the request of Dr. Legrand Rams due to unintentional weight loss. He is also reporting multiple symptoms including abdominal pain, dysphagia, and nausea.   Weight loss: unintended. No recent imaging on file. Will pursue CT abd/pelvis due to his constellation of symptoms. He is also reporting pressure against bladder and difficulty with urinary leakage. Referring to Urology for further evaluation.   Abdominal pain: reporting Goody powders. Suspect PUD. Less likely chronic mesenteric ischemia but need to keep in differentials. Will pursue EGD with dilation as well due to dysphagia. Last EGD with dilation in 2015. CT already planned due to abdominal pain.   Pursuing CT first. Will then pursue TCS/EGD/dilation. Will need to hold Eliquis X 48 hours prior.    PLAN:  CT abd/pelvis with contrast  Referral to Urology  Proceed with colonoscopy/EGD/dilation by Dr. Abbey Chatters  in near future: the risks, benefits, and alternatives have been discussed with the patient in detail. The patient states understanding and  desires to proceed.   HOLD ELIQUIS 48 hours prior  Continue omeprazole daily  Annitta Needs, PhD, [facility] Gastroenterology

## 2020-11-11 ENCOUNTER — Telehealth: Payer: Self-pay | Admitting: Gastroenterology

## 2020-11-11 NOTE — Telephone Encounter (Signed)
Patient needs his prescription sent to [facility] in [place], we sent it to [facility]

## 2020-11-12 NOTE — Telephone Encounter (Signed)
Returned the pts call and to find out he was suppose to pick up his medication from [facility]. He expressed understanding

## 2020-11-16 ENCOUNTER — Ambulatory Visit (HOSPITAL_COMMUNITY)
Admission: RE | Admit: 2020-11-16 | Discharge: 2020-11-16 | Disposition: A | Discharge status: Home / Self Care | Payer: BC Managed Care – PPO | Source: Ambulatory Visit | Attending: Gastroenterology | Admitting: Gastroenterology

## 2020-11-16 ENCOUNTER — Other Ambulatory Visit: Payer: Self-pay

## 2020-11-16 DIAGNOSIS — R634 Abnormal weight loss: Secondary | ICD-10-CM | POA: Diagnosis not present

## 2020-11-16 DIAGNOSIS — R109 Unspecified abdominal pain: Secondary | ICD-10-CM | POA: Insufficient documentation

## 2020-11-16 MED ORDER — IOHEXOL 300 MG/ML  SOLN
100.0000 mL | Freq: Once | INTRAMUSCULAR | Status: AC | PRN
  Administered 2020-11-16: 100 mL via INTRAVENOUS

## 2020-11-18 DIAGNOSIS — J449 Chronic obstructive pulmonary disease, unspecified: Secondary | ICD-10-CM | POA: Diagnosis not present

## 2020-11-18 DIAGNOSIS — I2510 Atherosclerotic heart disease of native coronary artery without angina pectoris: Secondary | ICD-10-CM | POA: Diagnosis not present

## 2020-11-18 DIAGNOSIS — I10 Essential (primary) hypertension: Secondary | ICD-10-CM | POA: Diagnosis not present

## 2020-11-18 DIAGNOSIS — U071 COVID-19: Secondary | ICD-10-CM | POA: Diagnosis not present

## 2020-11-18 DIAGNOSIS — Z23 Encounter for immunization: Secondary | ICD-10-CM | POA: Diagnosis not present

## 2020-11-18 DIAGNOSIS — F339 Major depressive disorder, recurrent, unspecified: Secondary | ICD-10-CM | POA: Diagnosis not present

## 2020-11-20 ENCOUNTER — Other Ambulatory Visit: Payer: Self-pay | Admitting: *Deleted

## 2020-11-20 ENCOUNTER — Encounter: Payer: Self-pay | Admitting: *Deleted

## 2020-11-20 ENCOUNTER — Encounter: Payer: Self-pay | Admitting: Gastroenterology

## 2020-11-20 DIAGNOSIS — R9389 Abnormal findings on diagnostic imaging of other specified body structures: Secondary | ICD-10-CM

## 2020-11-20 LAB — POCT I-STAT CREATININE: Creatinine, Ser: 0.9 mg/dL (ref 0.61–1.24)

## 2020-11-20 MED ORDER — PEG 3350-KCL-NA BICARB-NACL 420 G PO SOLR: ORAL | 0 refills | Status: AC

## 2020-11-20 NOTE — Addendum Note (Signed)
Addended by: Cheron Every on: 11/20/2020 08:02 AM   Modules accepted: Orders

## 2020-11-24 ENCOUNTER — Encounter: Payer: Self-pay | Admitting: Urology

## 2020-12-01 NOTE — Progress Notes (Signed)
Assessment: 1. Abnormal CT scan, bladder   2. Microscopic hematuria   3. Urinary frequency   4. BPH with obstruction/lower urinary tract symptoms    Plan: PSA today Urine culture today Today I had a discussion with the patient regarding the findings of microscopic hematuria including the implications and differential diagnoses associated with it.  I also discussed recommendations for further evaluation including the rationale for upper tract imaging and cystoscopy.  I discussed the nature of these procedures including potential risk and complications.  The patient expressed an understanding of these issues. Schedule for cystoscopy in 2 weeks Continue tamsulosin Trial of Myrbetriq 25 mg daily. Samples given.  Use and side effects discussed.   Chief Complaint:  Chief Complaint  Patient presents with   Urinary Frequency    History of Present Illness:  Kyle Lowery is a 70 y.o. year old male who is seen in consultation from Roseanne Kaufman, NP for evaluation of abnormal CT results.  He recently had a CT scan of the abdomen and pelvis for evaluation of abdominal pain with unintentional weight loss.  The CT from 11/17/20 showed bilateral subcentimeter hypodense renal lesions likely representing cyst, symmetrical wall thickening of an incompletely distended urinary bladder, and heterogeneous enhancement of the prostate gland.  He reports a >34-month history of lower urinary tract symptoms including frequency, nocturia, urgency, and urge incontinence.  He is voiding approximately every hour.  No dysuria or gross hematuria.  He was recently started on tamsulosin but has not noticed any change in his symptoms.  He reports intermittent abdominal pain, generally associated with eating or drinking.  He also reports a 15-20 pound weight loss in the past 6 months.  No prior history of UTIs or prostatitis.  No history of kidney stones. AUA score = 18 today.  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  He is on Eliquis for a. Fib.  Past Medical History:  Past Medical History:  Diagnosis Date   Carotid artery stenosis    COPD (chronic obstructive pulmonary disease) (HCC)    Coronary atherosclerosis of native coronary artery    Mild to moderate NOCAD with 80% ostial diagonal 1/12   Depression    Prior suicide attempt   Essential hypertension    GERD (gastroesophageal reflux disease)    Esophageal dilatation   Mixed hyperlipidemia    Paroxysmal atrial fibrillation (HCC)    Pneumonia due to Pseudomonas aeruginosa ([place])    Syncope    Neurocardiogenic    Past Surgical History:  Past Surgical History:  Procedure Laterality Date   CARDIAC CATHETERIZATION     COLONOSCOPY  2005   3 polyps, no path available   ESOPHAGOGASTRODUODENOSCOPY (EGD) WITH ESOPHAGEAL DILATION  2005   normal esophagus s/p dilation, antral gastritis and pyloric channel inflamed appearing. no path available   INGUINAL HERNIA REPAIR     Left orchiectomy     VASECTOMY      Allergies:  No Known Allergies  Family History:  Family History  Problem Relation Age of Onset   Hypertension Mother    Melanoma Mother    Depression Father        Suicide age 78   Hypercholesterolemia Father    Hypertension Father    Hypertension Brother    Lung disease Neg Hx    Colon cancer Neg Hx     Social History:  Social History   Tobacco Use   Smoking status: Every Day  Packs/day: 1.00    Years: 53.00    Pack years: 53.00    Types: Cigarettes    Start date: 08/23/1963    Last attempt to quit: 06/08/2018    Years since quitting: 2.4   Smokeless tobacco: Never   Tobacco comments:    Peak rate of 2ppd  Substance Use Topics   Alcohol use: Yes    Comment: "drink one once in a while"   Drug use: Yes    Types: Marijuana    Review of symptoms:  Constitutional:  Negative for night sweats, fever, chills ENT:  Negative for nose bleeds, sinus pain, painful swallowing CV:   Negative for chest pain, shortness of breath, exercise intolerance, palpitations, loss of consciousness Resp:  Negative for cough, wheezing, shortness of breath GI:  Negative for nausea, vomiting, diarrhea, bloody stools GU:  Positives noted in HPI; otherwise negative for gross hematuria, dysuria Neuro:  Negative for seizures, poor balance, limb weakness, slurred speech Psych:  Negative for lack of energy, depression, anxiety Endocrine:  Negative for polydipsia, polyuria, symptoms of hypoglycemia (dizziness, hunger, sweating) Hematologic:  Negative for anemia, purpura, petechia, prolonged or excessive bleeding, use of anticoagulants  Allergic:  Negative for difficulty breathing or choking as a result of exposure to anything; no shellfish allergy; no allergic response (rash/itch) to materials, foods  Physical exam: BP (!) 170/68   Pulse 70  GENERAL APPEARANCE:  Well appearing, well developed, well nourished, NAD HEENT: Atraumatic, Normocephalic, oropharynx clear. NECK: Supple without lymphadenopathy or thyromegaly. LUNGS: Clear to auscultation bilaterally. HEART: Regular Rate and Rhythm without murmurs, gallops, or rubs. ABDOMEN: Soft, non-tender, No Masses. EXTREMITIES: Moves all extremities well.  Without clubbing, cyanosis, or edema. NEUROLOGIC:  Alert and oriented x 3, normal gait, CN II-XII grossly intact.  MENTAL STATUS:  Appropriate. BACK:  Non-tender to palpation.  No CVAT SKIN:  Warm, dry and intact.   GU: Penis:  circumcised Meatus: Normal Scrotum: normal, no masses Testis: surgically absent on left; right normal Prostate: 40 g, NT, no nodules Rectum: Normal tone,  no masses or tenderness   Results: U/A: >30 RBCs    PVR = 4 ml

## 2020-12-02 ENCOUNTER — Other Ambulatory Visit: Payer: Self-pay

## 2020-12-02 ENCOUNTER — Encounter: Payer: Self-pay | Admitting: Urology

## 2020-12-02 ENCOUNTER — Encounter (HOSPITAL_COMMUNITY): Payer: Self-pay

## 2020-12-02 ENCOUNTER — Encounter (HOSPITAL_COMMUNITY)
Admission: RE | Admit: 2020-12-02 | Discharge: 2020-12-02 | Disposition: A | Discharge status: Home / Self Care | Payer: BC Managed Care – PPO | Source: Ambulatory Visit | Attending: Internal Medicine | Admitting: Internal Medicine

## 2020-12-02 ENCOUNTER — Ambulatory Visit (INDEPENDENT_AMBULATORY_CARE_PROVIDER_SITE_OTHER): Payer: BC Managed Care – PPO | Admitting: Urology

## 2020-12-02 VITALS — BP 170/68 | HR 70

## 2020-12-02 DIAGNOSIS — N401 Enlarged prostate with lower urinary tract symptoms: Secondary | ICD-10-CM | POA: Diagnosis not present

## 2020-12-02 DIAGNOSIS — E440 Moderate protein-calorie malnutrition: Secondary | ICD-10-CM

## 2020-12-02 DIAGNOSIS — R9341 Abnormal radiologic findings on diagnostic imaging of renal pelvis, ureter, or bladder: Secondary | ICD-10-CM

## 2020-12-02 DIAGNOSIS — R3129 Other microscopic hematuria: Secondary | ICD-10-CM

## 2020-12-02 DIAGNOSIS — R350 Frequency of micturition: Secondary | ICD-10-CM | POA: Diagnosis not present

## 2020-12-02 DIAGNOSIS — N138 Other obstructive and reflux uropathy: Secondary | ICD-10-CM

## 2020-12-02 DIAGNOSIS — D649 Anemia, unspecified: Secondary | ICD-10-CM

## 2020-12-02 LAB — URINALYSIS, ROUTINE W REFLEX MICROSCOPIC
Bilirubin, UA: NEGATIVE
Glucose, UA: NEGATIVE
Ketones, UA: NEGATIVE
Leukocytes,UA: NEGATIVE
Nitrite, UA: NEGATIVE
RBC, UA: NEGATIVE
Specific Gravity, UA: 1.015 (ref 1.005–1.030)
Urobilinogen, Ur: 0.2 mg/dL (ref 0.2–1.0)
pH, UA: 7 (ref 5.0–7.5)

## 2020-12-02 LAB — MICROSCOPIC EXAMINATION
Bacteria, UA: NONE SEEN
RBC, Urine: >30 /hpf — AB (ref 0–2)
Renal Epithel, UA: NONE SEEN /hpf
WBC, UA: NONE SEEN /hpf (ref 0–5)

## 2020-12-02 MED ORDER — MIRABEGRON ER 25 MG PO TB24
25.0000 mg | ORAL_TABLET | Freq: Every day | ORAL | 0 refills | Status: DC
Start: 2020-12-02 — End: 2020-12-15

## 2020-12-02 NOTE — Progress Notes (Signed)
post void residual=4  Urological Symptom Review  Patient is experiencing the following symptoms: Frequent urination Hard to postpone urination Get up at night to urinate Leakage of urine   Review of Systems  Gastrointestinal (upper)  : Negative for upper GI symptoms  Gastrointestinal (lower) : Negative for lower GI symptoms  Constitutional : Weight loss  Skin: Negative for skin symptoms  Eyes: Negative for eye symptoms  Ear/Nose/Throat : Sinus problems  Hematologic/Lymphatic: Easy bruising  Cardiovascular : Negative for cardiovascular symptoms  Respiratory : Cough Shortness of breath  Endocrine: Excessive thirst  Musculoskeletal: Back pain  Neurological: Headaches  Psychologic: Negative for psychiatric symptoms

## 2020-12-02 NOTE — Patient Instructions (Signed)
Kyle Lowery  12/02/2020     @PREFPERIOPPHARMACY @   Your procedure is scheduled on 12/08/2019.   Report to [facility] at  [place] A.M.   Call this number if you have problems the morning of surgery:  [phone]   Remember:  Follow the diet and prep instructions given to you by the office.     Your last dose of eliquis should be 10/28.    Use your nebulizer before you come to the hospital.      Take these medicines the morning of surgery with A SIP OF WATER      diltiazem, isosorbide, metoprolol, prilosec, paxil, flomax.    Do not wear jewelry, make-up or nail polish.  Do not wear lotions, powders, or perfumes, or deodorant.  Do not shave 48 hours prior to surgery.  Men may shave face and neck.  Do not bring valuables to the hospital.  [facility] is not responsible for any belongings or valuables.  Contacts, dentures or bridgework may not be worn into surgery.  Leave your suitcase in the car.  After surgery it may be brought to your room.  For patients admitted to the hospital, discharge time will be determined by your treatment team.  Patients discharged the day of surgery will not be allowed to drive home and must have someone with them for 24 hours.    Special instructions:   DO NOT smoke tobacco or vape for 24 hours before your procedure.  Please read over the following fact sheets that you were given. Anesthesia Post-op Instructions and Care and Recovery After Surgery      Upper Endoscopy, Adult, Care After This sheet gives you information about how to care for yourself after your procedure. Your health care provider may also give you more specific instructions. If you have problems or questions, contact your health care provider. What can I expect after the procedure? After the procedure, it is common to have: A sore throat. Mild stomach pain or discomfort. Bloating. Nausea. Follow these instructions at home:  Follow instructions from  your health care provider about what to eat or drink after your procedure. Return to your normal activities as told by your health care provider. Ask your health care provider what activities are safe for you. Take over-the-counter and prescription medicines only as told by your health care provider. If you were given a sedative during the procedure, it can affect you for several hours. Do not drive or operate machinery until your health care provider says that it is safe. Keep all follow-up visits as told by your health care provider. This is important. Contact a health care provider if you have: A sore throat that lasts longer than one day. Trouble swallowing. Get help right away if: You vomit blood or your vomit looks like coffee grounds. You have: A fever. Bloody, black, or tarry stools. A severe sore throat or you cannot swallow. Difficulty breathing. Severe pain in your chest or abdomen. Summary After the procedure, it is common to have a sore throat, mild stomach discomfort, bloating, and nausea. If you were given a sedative during the procedure, it can affect you for several hours. Do not drive or operate machinery until your health care provider says that it is safe. Follow instructions from your health care provider about what to eat or drink after your procedure. Return to your normal activities as told by your health care provider. This information  is not intended to replace advice given to you by your health care provider. Make sure you discuss any questions you have with your health care provider. Document Revised: 01/22/2019 Document Reviewed: 06/26/2017 Elsevier Patient Education  2022 [employer]. Esophageal Dilatation Esophageal dilatation, also called esophageal dilation, is a procedure to widen or open a blocked or narrowed part of the esophagus. The esophagus is the part of the body that moves food and liquid from the mouth to the stomach. You may need this procedure  if: You have a buildup of scar tissue in your esophagus that makes it difficult, painful, or impossible to swallow. This can be caused by gastroesophageal reflux disease (GERD). You have cancer of the esophagus. There is a problem with how food moves through your esophagus. In some cases, you may need this procedure repeated at a later time to dilate the esophagus gradually. Tell a health care provider about: Any allergies you have. All medicines you are taking, including vitamins, herbs, eye drops, creams, and over-the-counter medicines. Any problems you or family members have had with anesthetic medicines. Any blood disorders you have. Any surgeries you have had. Any medical conditions you have. Any antibiotic medicines you are required to take before dental procedures. Whether you are pregnant or may be pregnant. What are the risks? Generally, this is a safe procedure. However, problems may occur, including: Bleeding due to a tear in the lining of the esophagus. A hole, or perforation, in the esophagus. What happens before the procedure? Ask your health care provider about: Changing or stopping your regular medicines. This is especially important if you are taking diabetes medicines or blood thinners. Taking medicines such as aspirin and ibuprofen. These medicines can thin your blood. Do not take these medicines unless your health care provider tells you to take them. Taking over-the-counter medicines, vitamins, herbs, and supplements. Follow instructions from your health care provider about eating or drinking restrictions. Plan to have a responsible adult take you home from the hospital or clinic. Plan to have a responsible adult care for you for the time you are told after you leave the hospital or clinic. This is important. What happens during the procedure? You may be given a medicine to help you relax (sedative). A numbing medicine may be sprayed into the back of your throat, or  you may gargle the medicine. Your health care provider may perform the dilatation using various surgical instruments, such as: Simple dilators. This instrument is carefully placed in the esophagus to stretch it. Guided wire bougies. This involves using an endoscope to insert a wire into the esophagus. A dilator is passed over this wire to enlarge the esophagus. Then the wire is removed. Balloon dilators. An endoscope with a small balloon is inserted into the esophagus. The balloon is inflated to stretch the esophagus and open it up. The procedure may vary among health care providers and hospitals. What can I expect after the procedure? Your blood pressure, heart rate, breathing rate, and blood oxygen level will be monitored until you leave the hospital or clinic. Your throat may feel slightly sore and numb. This will get better over time. You will not be allowed to eat or drink until your throat is no longer numb. When you are able to drink, urinate, and sit on the edge of the bed without nausea or dizziness, you may be able to return home. Follow these instructions at home: Take over-the-counter and prescription medicines only as told by your health care provider.  If you were given a sedative during the procedure, it can affect you for several hours. Do not drive or operate machinery until your health care provider says that it is safe. Plan to have a responsible adult care for you for the time you are told. This is important. Follow instructions from your health care provider about any eating or drinking restrictions. Do not use any products that contain nicotine or tobacco, such as cigarettes, e-cigarettes, and chewing tobacco. If you need help quitting, ask your health care provider. Keep all follow-up visits. This is important. Contact a health care provider if: You have a fever. You have pain that is not relieved by medicine. Get help right away if: You have chest pain. You have trouble  breathing. You have trouble swallowing. You vomit blood. You have black, tarry, or bloody stools. These symptoms may represent a serious problem that is an emergency. Do not wait to see if the symptoms will go away. Get medical help right away. Call your local emergency services (911 in the [place] Do not drive yourself to the hospital. Summary Esophageal dilatation, also called esophageal dilation, is a procedure to widen or open a blocked or narrowed part of the esophagus. Plan to have a responsible adult take you home from the hospital or clinic. For this procedure, a numbing medicine may be sprayed into the back of your throat, or you may gargle the medicine. Do not drive or operate machinery until your health care provider says that it is safe. This information is not intended to replace advice given to you by your health care provider. Make sure you discuss any questions you have with your health care provider. Document Revised: 06/12/2019 Document Reviewed: 06/12/2019 Elsevier Patient Education  [place]. Colonoscopy, Adult, Care After This sheet gives you information about how to care for yourself after your procedure. Your health care provider may also give you more specific instructions. If you have problems or questions, contact your health care provider. What can I expect after the procedure? After the procedure, it is common to have: A small amount of blood in your stool for 24 hours after the procedure. Some gas. Mild cramping or bloating of your abdomen. Follow these instructions at home: Eating and drinking  Drink enough fluid to keep your urine pale yellow. Follow instructions from your health care provider about eating or drinking restrictions. Resume your normal diet as instructed by your health care provider. Avoid heavy or fried foods that are hard to digest. Activity Rest as told by your health care provider. Avoid sitting for a long time without moving. Get  up to take short walks every 1-2 hours. This is important to improve blood flow and breathing. Ask for help if you feel weak or unsteady. Return to your normal activities as told by your health care provider. Ask your health care provider what activities are safe for you. Managing cramping and bloating  Try walking around when you have cramps or feel bloated. Apply heat to your abdomen as told by your health care provider. Use the heat source that your health care provider recommends, such as a moist heat pack or a heating pad. Place a towel between your skin and the heat source. Leave the heat on for 20-30 minutes. Remove the heat if your skin turns bright red. This is especially important if you are unable to feel pain, heat, or cold. You may have a greater risk of getting burned. General instructions If you were  given a sedative during the procedure, it can affect you for several hours. Do not drive or operate machinery until your health care provider says that it is safe. For the first 24 hours after the procedure: Do not sign important documents. Do not drink alcohol. Do your regular daily activities at a slower pace than normal. Eat soft foods that are easy to digest. Take over-the-counter and prescription medicines only as told by your health care provider. Keep all follow-up visits as told by your health care provider. This is important. Contact a health care provider if: You have blood in your stool 2-3 days after the procedure. Get help right away if you have: More than a small spotting of blood in your stool. Large blood clots in your stool. Swelling of your abdomen. Nausea or vomiting. A fever. Increasing pain in your abdomen that is not relieved with medicine. Summary After the procedure, it is common to have a small amount of blood in your stool. You may also have mild cramping and bloating of your abdomen. If you were given a sedative during the procedure, it can affect  you for several hours. Do not drive or operate machinery until your health care provider says that it is safe. Get help right away if you have a lot of blood in your stool, nausea or vomiting, a fever, or increased pain in your abdomen. This information is not intended to replace advice given to you by your health care provider. Make sure you discuss any questions you have with your health care provider. Document Revised: 01/18/2019 Document Reviewed: 08/20/2018 Elsevier Patient Education  [place] After This sheet gives you information about how to care for yourself after your procedure. Your health care provider may also give you more specific instructions. If you have problems or questions, contact your health care provider. What can I expect after the procedure? After the procedure, it is common to have: Tiredness. Forgetfulness about what happened after the procedure. Impaired judgment for important decisions. Nausea or vomiting. Some difficulty with balance. Follow these instructions at home: For the time period you were told by your health care provider:   Rest as needed. Do not participate in activities where you could fall or become injured. Do not drive or use machinery. Do not drink alcohol. Do not take sleeping pills or medicines that cause drowsiness. Do not make important decisions or sign legal documents. Do not take care of children on your own. Eating and drinking Follow the diet that is recommended by your health care provider. Drink enough fluid to keep your urine pale yellow. If you vomit: Drink water, juice, or soup when you can drink without vomiting. Make sure you have little or no nausea before eating solid foods. General instructions Have a responsible adult stay with you for the time you are told. It is important to have someone help care for you until you are awake and alert. Take over-the-counter and prescription  medicines only as told by your health care provider. If you have sleep apnea, surgery and certain medicines can increase your risk for breathing problems. Follow instructions from your health care provider about wearing your sleep device: Anytime you are sleeping, including during daytime naps. While taking prescription pain medicines, sleeping medicines, or medicines that make you drowsy. Avoid smoking. Keep all follow-up visits as told by your health care provider. This is important. Contact a health care provider if: You keep feeling nauseous or you keep  vomiting. You feel light-headed. You are still sleepy or having trouble with balance after 24 hours. You develop a rash. You have a fever. You have redness or swelling around the IV site. Get help right away if: You have trouble breathing. You have new-onset confusion at home. Summary For several hours after your procedure, you may feel tired. You may also be forgetful and have poor judgment. Have a responsible adult stay with you for the time you are told. It is important to have someone help care for you until you are awake and alert. Rest as told. Do not drive or operate machinery. Do not drink alcohol or take sleeping pills. Get help right away if you have trouble breathing, or if you suddenly become confused. This information is not intended to replace advice given to you by your health care provider. Make sure you discuss any questions you have with your health care provider. Document Revised: 10/10/2019 Document Reviewed: 12/27/2018 Elsevier Patient Education  2022 [employer].

## 2020-12-03 ENCOUNTER — Other Ambulatory Visit: Payer: Self-pay

## 2020-12-03 ENCOUNTER — Telehealth: Payer: Self-pay | Admitting: *Deleted

## 2020-12-03 ENCOUNTER — Encounter (HOSPITAL_COMMUNITY): Payer: Self-pay

## 2020-12-03 ENCOUNTER — Encounter (HOSPITAL_COMMUNITY)
Admission: RE | Admit: 2020-12-03 | Discharge: 2020-12-03 | Disposition: A | Discharge status: Home / Self Care | Payer: BC Managed Care – PPO | Source: Ambulatory Visit | Attending: Internal Medicine | Admitting: Internal Medicine

## 2020-12-03 DIAGNOSIS — Z01812 Encounter for preprocedural laboratory examination: Secondary | ICD-10-CM | POA: Insufficient documentation

## 2020-12-03 DIAGNOSIS — E440 Moderate protein-calorie malnutrition: Secondary | ICD-10-CM | POA: Insufficient documentation

## 2020-12-03 DIAGNOSIS — D649 Anemia, unspecified: Secondary | ICD-10-CM | POA: Insufficient documentation

## 2020-12-03 LAB — CBC WITH DIFFERENTIAL/PLATELET
Abs Immature Granulocytes: 0.01 10*3/uL (ref 0.00–0.07)
Basophils Absolute: 0.1 10*3/uL (ref 0.0–0.1)
Basophils Relative: 1 %
Eosinophils Absolute: 0.2 10*3/uL (ref 0.0–0.5)
Eosinophils Relative: 2 %
HCT: 40.4 % (ref 39.0–52.0)
Hemoglobin: 13.5 g/dL (ref 13.0–17.0)
Immature Granulocytes: 0 %
Lymphocytes Relative: 29 %
Lymphs Abs: 2.3 10*3/uL (ref 0.7–4.0)
MCH: 34.3 pg — ABNORMAL HIGH (ref 26.0–34.0)
MCHC: 33.4 g/dL (ref 30.0–36.0)
MCV: 102.5 fL — ABNORMAL HIGH (ref 80.0–100.0)
Monocytes Absolute: 0.8 10*3/uL (ref 0.1–1.0)
Monocytes Relative: 10 %
Neutro Abs: 4.6 10*3/uL (ref 1.7–7.7)
Neutrophils Relative %: 58 %
Platelets: 240 10*3/uL (ref 150–400)
RBC: 3.94 MIL/uL — ABNORMAL LOW (ref 4.22–5.81)
RDW: 12.8 % (ref 11.5–15.5)
WBC: 7.9 10*3/uL (ref 4.0–10.5)
nRBC: 0 % (ref 0.0–0.2)

## 2020-12-03 LAB — BASIC METABOLIC PANEL
Anion gap: 8 (ref 5–15)
BUN: 15 mg/dL (ref 8–23)
CO2: 27 mmol/L (ref 22–32)
Calcium: 8.3 mg/dL — ABNORMAL LOW (ref 8.9–10.3)
Chloride: 102 mmol/L (ref 98–111)
Creatinine, Ser: 0.81 mg/dL (ref 0.61–1.24)
GFR, Estimated: >60 mL/min (ref >60)
Glucose, Bld: 107 mg/dL — ABNORMAL HIGH (ref 70–99)
Potassium: 3.1 mmol/L — ABNORMAL LOW (ref 3.5–5.1)
Sodium: 137 mmol/L (ref 135–145)

## 2020-12-03 LAB — PSA: Prostate Specific Ag, Serum: 1.3 ng/mL (ref 0.0–4.0)

## 2020-12-03 NOTE — Telephone Encounter (Signed)
Called pt, he reports he had an urology appt yesterday and was not able to make his pre-op appt. Provided # to carolyn to call to see if his pre-op can be done.

## 2020-12-03 NOTE — Telephone Encounter (Signed)
-----   Message from Jacqulynn Cadet, RN sent at 12/02/2020 12:00 PM EDT ----- Regarding: No Show Kyle Lowery did not show up for his PAT testing this morning

## 2020-12-04 ENCOUNTER — Telehealth: Payer: Self-pay

## 2020-12-04 LAB — URINE CULTURE

## 2020-12-04 NOTE — Telephone Encounter (Signed)
Patient called and notified.

## 2020-12-04 NOTE — Telephone Encounter (Signed)
-----   Message from Primus Bravo, MD sent at 12/04/2020 12:30 PM EDT ----- Please notify patient that his urine culture does not show evidence of a UTI.   Recommend proceeding with plans for cystoscopy as scheduled.

## 2020-12-07 ENCOUNTER — Ambulatory Visit (HOSPITAL_COMMUNITY): Payer: BC Managed Care – PPO | Admitting: Anesthesiology

## 2020-12-07 ENCOUNTER — Ambulatory Visit (HOSPITAL_COMMUNITY)
Admission: RE | Admit: 2020-12-07 | Discharge: 2020-12-07 | Disposition: A | Discharge status: Home / Self Care | Payer: BC Managed Care – PPO | Attending: Internal Medicine | Admitting: Internal Medicine

## 2020-12-07 ENCOUNTER — Encounter (HOSPITAL_COMMUNITY): Payer: Self-pay

## 2020-12-07 ENCOUNTER — Encounter (HOSPITAL_COMMUNITY)
Admission: RE | Disposition: A | Discharge status: Home / Self Care | Payer: Self-pay | Source: Home / Self Care | Attending: Internal Medicine

## 2020-12-07 DIAGNOSIS — K2970 Gastritis, unspecified, without bleeding: Secondary | ICD-10-CM | POA: Insufficient documentation

## 2020-12-07 DIAGNOSIS — K648 Other hemorrhoids: Secondary | ICD-10-CM | POA: Insufficient documentation

## 2020-12-07 DIAGNOSIS — Q394 Esophageal web: Secondary | ICD-10-CM | POA: Insufficient documentation

## 2020-12-07 DIAGNOSIS — D120 Benign neoplasm of cecum: Secondary | ICD-10-CM | POA: Insufficient documentation

## 2020-12-07 DIAGNOSIS — R634 Abnormal weight loss: Secondary | ICD-10-CM | POA: Insufficient documentation

## 2020-12-07 DIAGNOSIS — D123 Benign neoplasm of transverse colon: Secondary | ICD-10-CM | POA: Insufficient documentation

## 2020-12-07 DIAGNOSIS — I10 Essential (primary) hypertension: Secondary | ICD-10-CM | POA: Diagnosis not present

## 2020-12-07 DIAGNOSIS — K319 Disease of stomach and duodenum, unspecified: Secondary | ICD-10-CM | POA: Diagnosis not present

## 2020-12-07 DIAGNOSIS — J449 Chronic obstructive pulmonary disease, unspecified: Secondary | ICD-10-CM | POA: Diagnosis not present

## 2020-12-07 DIAGNOSIS — R1310 Dysphagia, unspecified: Secondary | ICD-10-CM | POA: Diagnosis not present

## 2020-12-07 DIAGNOSIS — D122 Benign neoplasm of ascending colon: Secondary | ICD-10-CM | POA: Diagnosis not present

## 2020-12-07 DIAGNOSIS — F17200 Nicotine dependence, unspecified, uncomplicated: Secondary | ICD-10-CM | POA: Diagnosis not present

## 2020-12-07 DIAGNOSIS — K219 Gastro-esophageal reflux disease without esophagitis: Secondary | ICD-10-CM | POA: Insufficient documentation

## 2020-12-07 DIAGNOSIS — K635 Polyp of colon: Secondary | ICD-10-CM | POA: Diagnosis not present

## 2020-12-07 DIAGNOSIS — F17210 Nicotine dependence, cigarettes, uncomplicated: Secondary | ICD-10-CM | POA: Diagnosis not present

## 2020-12-07 DIAGNOSIS — D126 Benign neoplasm of colon, unspecified: Secondary | ICD-10-CM | POA: Diagnosis not present

## 2020-12-07 HISTORY — PX: POLYPECTOMY: SHX5525

## 2020-12-07 HISTORY — PX: COLONOSCOPY WITH PROPOFOL: SHX5780

## 2020-12-07 HISTORY — PX: BIOPSY: SHX5522

## 2020-12-07 HISTORY — PX: BALLOON DILATION: SHX5330

## 2020-12-07 HISTORY — PX: ESOPHAGOGASTRODUODENOSCOPY (EGD) WITH PROPOFOL: SHX5813

## 2020-12-07 SURGERY — COLONOSCOPY WITH PROPOFOL
Anesthesia: General

## 2020-12-07 MED ORDER — LACTATED RINGERS IV SOLN
INTRAVENOUS | Status: DC
  Administered 2020-12-07: 1000 mL via INTRAVENOUS

## 2020-12-07 MED ORDER — OMEPRAZOLE 20 MG PO CPDR: 20.0000 mg | DELAYED_RELEASE_CAPSULE | Freq: Two times a day (BID) | ORAL | 5 refills | Status: AC

## 2020-12-07 MED ORDER — LIDOCAINE HCL (CARDIAC) PF 100 MG/5ML IV SOSY
PREFILLED_SYRINGE | INTRAVENOUS | Status: DC | PRN
  Administered 2020-12-07: 50 mg via INTRAVENOUS

## 2020-12-07 MED ORDER — PROPOFOL 500 MG/50ML IV EMUL
INTRAVENOUS | Status: DC | PRN
  Administered 2020-12-07: 125 ug/kg/min via INTRAVENOUS

## 2020-12-07 MED ORDER — PROPOFOL 10 MG/ML IV BOLUS
INTRAVENOUS | Status: DC | PRN
  Administered 2020-12-07: 80 mg via INTRAVENOUS

## 2020-12-07 MED ORDER — PHENYLEPHRINE HCL (PRESSORS) 10 MG/ML IV SOLN
INTRAVENOUS | Status: DC | PRN
  Administered 2020-12-07 (×2): 80 ug via INTRAVENOUS

## 2020-12-07 NOTE — Op Note (Signed)
Kyle Lowery Procedure Date: 12/07/2020 12:48 PM MRN: [id number] Date of Birth: 24-Jun-1950 Attending MD: Kyle Lowery. Edgar Frisk CSN: [id number] Age: 70 Admit Type: Outpatient Procedure:                Colonoscopy Indications:              Lower abdominal pain, Weight loss Providers:                Kyle Lowery. Kyle Chatters, DO, Selena Lesser RN, RN,                            Randa Spike, Technician Referring MD:              Medicines:                See the Anesthesia note for documentation of the                            administered medications Complications:            No immediate complications. Estimated Blood Loss:     Estimated blood loss was minimal. Procedure:                Pre-Anesthesia Assessment:                           - The anesthesia plan was to use monitored                            anesthesia care (MAC).                           After obtaining informed consent, the colonoscope                            was passed under direct vision. Throughout the                            procedure, the patient's blood pressure, pulse, and                            oxygen saturations were monitored continuously. The                            PCF-HQ190L ([id number]) scope was introduced through                            the anus and advanced to the the cecum, identified                            by appendiceal orifice and ileocecal valve. The                            colonoscopy was performed without difficulty. The                            patient tolerated the procedure well.  The quality                            of the bowel preparation was evaluated using the                            BBPS [facility] Bowel Preparation Scale) with scores                            of: Right Colon = 3, Transverse Colon = 3 and Left                            Colon = 3 (entire mucosa seen well with no residual                            staining, small  fragments of stool or opaque                            liquid). The total BBPS score equals 9. Scope In: 12:50:03 PM Scope Out: 1:20:03 PM Scope Withdrawal Time: 0 hours 27 minutes 3 seconds  Total Procedure Duration: 0 hours 30 minutes 0 seconds  Findings:      The perianal and digital rectal examinations were normal.      Non-bleeding internal hemorrhoids were found during endoscopy.      Three sessile polyps were found in the transverse colon. The polyps were       6 to 8 mm in size. These polyps were removed with a cold snare.       Resection and retrieval were complete.      Three sessile polyps were found in the ascending colon and cecum. The       polyps were 4 to 6 mm in size. These polyps were removed with a cold       snare. Resection and retrieval were complete.      A 16 mm polyp was found in the cecum. The polyp was fsessile. The polyp       was removed with a hot snare. Resection and retrieval were complete.      A 10 mm polyp was found in the cecum. The polyp was flat. Preparations       were made for mucosal resection. Eleview was injected with adequate lift       of the lesion from the muscularis propria. Margins were well demarcated.       Snare mucosal resection was performed. Resection and retrieval were       complete. There was no bleeding at the end of the procedure. Impression:               - Non-bleeding internal hemorrhoids.                           - Three 6 to 8 mm polyps in the transverse colon,                            removed with a cold snare. Resected and retrieved.                           -  Three 4 to 6 mm polyps in the ascending colon and                            in the cecum, removed with a cold snare. Resected                            and retrieved.                           - One 16 mm polyp in the cecum, removed with a hot                            snare. Resected and retrieved.                           - One 10 mm polyp in the cecum,  removed with                            mucosal resection. Resected and retrieved.                           - Mucosal resection was performed. Resection and                            retrieval were complete. Moderate Sedation:      Per Anesthesia Care Recommendation:           - Patient has a contact number available for                            emergencies. The signs and symptoms of potential                            delayed complications were discussed with the                            patient. Return to normal activities tomorrow.                            Written discharge instructions were provided to the                            patient.                           - Resume previous diet.                           - Continue present medications.                           - Await pathology results.                           - Repeat colonoscopy in 1 year for surveillance.                           -  Return to GI clinic in 6 months. Procedure Code(s):        --- Professional ---                           [phone], Colonoscopy, flexible; with endoscopic                            mucosal resection                           45385, 88, Colonoscopy, flexible; with removal of                            tumor(s), polyp(s), or other lesion(s) by snare                            technique Diagnosis Code(s):        --- Professional ---                           K63.5, Polyp of colon                           K64.8, Other hemorrhoids                           R10.30, Lower abdominal pain, unspecified                           R63.4, Abnormal weight loss CPT copyright 2019 American [employer] Association. All rights reserved. The codes documented in this report are preliminary and upon coder review may  be revised to meet current compliance requirements. Kyle Lowery. Kyle Chatters, DO [place] Kyle Chatters, DO 12/07/2020 1:37:11 PM This report has been signed electronically. Number of Addenda: 0

## 2020-12-07 NOTE — Interval H&P Note (Signed)
History and Physical Interval Note:  12/07/2020 12:24 PM  Kyle Lowery  has presented today for surgery, with the diagnosis of weight loss, abominal pain, dysphagia.  The various methods of treatment have been discussed with the patient and family. After consideration of risks, benefits and other options for treatment, the patient has consented to  Procedure(s) with comments: COLONOSCOPY WITH PROPOFOL (N/A) - 12:45pm ESOPHAGOGASTRODUODENOSCOPY (EGD) WITH PROPOFOL (N/A) BALLOON DILATION (N/A) as a surgical intervention.  The patient's history has been reviewed, patient examined, no change in status, stable for surgery.  I have reviewed the patient's chart and labs.  Questions were answered to the patient's satisfaction.     Eloise Harman

## 2020-12-07 NOTE — Transfer of Care (Signed)
Immediate Anesthesia Transfer of Care Note  Patient: Kyle Lowery  Procedure(s) Performed: COLONOSCOPY WITH PROPOFOL ESOPHAGOGASTRODUODENOSCOPY (EGD) WITH PROPOFOL BALLOON DILATION BIOPSY POLYPECTOMY  Patient Location: Short Stay  Anesthesia Type:General  Level of Consciousness: awake, alert  and oriented  Airway & Oxygen Therapy: Patient Spontanous Breathing  Post-op Assessment: Report given to RN and Post -op Vital signs reviewed and stable  Post vital signs: Reviewed and stable  Last Vitals:  Vitals Value Taken Time  BP    Temp    Pulse    Resp    SpO2      Last Pain:  Vitals:   12/07/20 1236  TempSrc:   PainSc: 0-No pain      Patients Stated Pain Goal: 6 (71/29/29 0903)  Complications: No notable events documented.

## 2020-12-07 NOTE — Anesthesia Preprocedure Evaluation (Signed)
Anesthesia Evaluation  Patient identified by MRN, date of birth, ID band Patient awake    Reviewed: Allergy & Precautions, H&P , NPO status , Patient's Chart, lab work & pertinent test results, reviewed documented beta blocker date and time   Airway Mallampati: II  TM Distance: >3 FB Neck ROM: full    Dental no notable dental hx.    Pulmonary pneumonia, COPD, Current Smoker,    Pulmonary exam normal breath sounds clear to auscultation       Cardiovascular Exercise Tolerance: Good hypertension, + CAD  negative cardio ROS   Rhythm:regular Rate:Normal     Neuro/Psych  Headaches, PSYCHIATRIC DISORDERS Depression negative neurological ROS  negative psych ROS   GI/Hepatic negative GI ROS, Neg liver ROS, GERD  Medicated,  Endo/Other  negative endocrine ROS  Renal/GU negative Renal ROS  negative genitourinary   Musculoskeletal   Abdominal   Peds  Hematology negative hematology ROS (+) Blood dyscrasia, anemia ,   Anesthesia Other Findings   Reproductive/Obstetrics negative OB ROS                             Anesthesia Physical Anesthesia Plan  ASA: 3  Anesthesia Plan: General   Post-op Pain Management:    Induction:   PONV Risk Score and Plan: Propofol infusion  Airway Management Planned:   Additional Equipment:   Intra-op Plan:   Post-operative Plan:   Informed Consent: I have reviewed the patients History and Physical, chart, labs and discussed the procedure including the risks, benefits and alternatives for the proposed anesthesia with the patient or authorized representative who has indicated his/her understanding and acceptance.     Dental Advisory Given  Plan Discussed with: CRNA  Anesthesia Plan Comments:         Anesthesia Quick Evaluation

## 2020-12-07 NOTE — Op Note (Signed)
[facility] Patient Name: Kyle Lowery Procedure Date: 12/07/2020 12:21 PM MRN: [id number] Date of Birth: 10-11-1950 Attending MD: [name] Edgar Frisk CSN: [id number] Age: 70 Admit Type: Outpatient Procedure:                Upper GI endoscopy Indications:              Dysphagia, Weight loss Providers:                [name] Abbey Chatters, DO, Charlsie Quest. Theda Sers RN, RN,                            Randa Spike, Technician Referring MD:              Medicines:                See the Anesthesia note for documentation of the                            administered medications Complications:            No immediate complications. Estimated Blood Loss:     Estimated blood loss was minimal. Procedure:                Pre-Anesthesia Assessment:                           - The anesthesia plan was to use monitored                            anesthesia care (MAC).                           After obtaining informed consent, the endoscope was                            passed under direct vision. Throughout the                            procedure, the patient's blood pressure, pulse, and                            oxygen saturations were monitored continuously. The                            GIF-H190 ([id number]) scope was introduced through the                            mouth, and advanced to the second part of duodenum.                            The upper GI endoscopy was accomplished without                            difficulty. The patient tolerated the procedure                            well. Scope In:  12:40:34 PM Scope Out: 50:09:38 PM Total Procedure Duration: 0 hours 5 minutes 28 seconds  Findings:      There is no endoscopic evidence of bleeding, areas of erosion,       esophagitis, hiatal hernia, ulcerations or varices in the entire       esophagus.      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 A TTS dilator was       passed through the scope. Dilation with an  18-19-20 mm balloon dilator       was performed to 20 mm. The dilation site was examined and showed       moderate improvement in luminal narrowing.      Patchy mild inflammation characterized by erythema was found in the       gastric body. Biopsies were taken with a cold forceps for Helicobacter       pylori testing.      The duodenal bulb, first portion of the duodenum and second portion of       the duodenum were normal. 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                           - Gastritis. Biopsied.                           - Normal duodenal bulb, first portion of the                            duodenum and second portion of the duodenum. Moderate Sedation:      Per Anesthesia Care Recommendation:           - Patient has a contact number available for                            emergencies. The signs and symptoms of potential                            delayed complications were discussed with the                            patient. Return to normal activities tomorrow.                            Written discharge instructions were provided to the                            patient.                           - Resume previous diet.                           - Continue present medications.                           - Await pathology results.                           -  Repeat upper endoscopy PRN for retreatment.                           - Return to GI clinic in 6 months.                           - Use Prilosec (omeprazole) 20 mg PO BID. Procedure Code(s):        --- Professional ---                           [phone], Esophagogastroduodenoscopy, flexible,                            transoral; with transendoscopic balloon dilation of                            esophagus (less than 30 mm diameter)                           43239, 59, Esophagogastroduodenoscopy, flexible,                            transoral; with biopsy, single or  multiple Diagnosis Code(s):        --- Professional ---                           Q39.4, Esophageal web                           K29.70, Gastritis, unspecified, without bleeding                           R13.10, Dysphagia, unspecified                           R63.4, Abnormal weight loss CPT copyright 2019 American Medical Association. All rights reserved. The codes documented in this report are preliminary and upon coder review may  be revised to meet current compliance requirements. [name] Abbey Chatters, DO [place], DO 12/07/2020 12:49:17 PM Number of Addenda: 0

## 2020-12-07 NOTE — Discharge Instructions (Addendum)
EGD Discharge instructions Please read the instructions outlined below and refer to this sheet in the next few weeks. These discharge instructions provide you with general information on caring for yourself after you leave the hospital. Your doctor may also give you specific instructions. While your treatment has been planned according to the most current medical practices available, unavoidable complications occasionally occur. If you have any problems or questions after discharge, please call your doctor. ACTIVITY You may resume your regular activity but move at a slower pace for the next 24 hours.  Take frequent rest periods for the next 24 hours.  Walking will help expel (get rid of) the air and reduce the bloated feeling in your abdomen.  No driving for 24 hours (because of the anesthesia (medicine) used during the test).  You may shower.  Do not sign any important legal documents or operate any machinery for 24 hours (because of the anesthesia used during the test).  NUTRITION Drink plenty of fluids.  You may resume your normal diet.  Begin with a light meal and progress to your normal diet.  Avoid alcoholic beverages for 24 hours or as instructed by your caregiver.  MEDICATIONS You may resume your normal medications unless your caregiver tells you otherwise.  WHAT YOU CAN EXPECT TODAY You may experience abdominal discomfort such as a feeling of fullness or "gas" pains.  FOLLOW-UP Your doctor will discuss the results of your test with you.  SEEK IMMEDIATE MEDICAL ATTENTION IF ANY OF THE FOLLOWING OCCUR: Excessive nausea (feeling sick to your stomach) and/or vomiting.  Severe abdominal pain and distention (swelling).  Trouble swallowing.  Temperature over 101 F (37.8 C).  Rectal bleeding or vomiting of blood.     Colonoscopy Discharge Instructions  Read the instructions outlined below and refer to this sheet in the next few weeks. These discharge instructions provide you with  general information on caring for yourself after you leave the hospital. Your doctor may also give you specific instructions. While your treatment has been planned according to the most current medical practices available, unavoidable complications occasionally occur.   ACTIVITY You may resume your regular activity, but move at a slower pace for the next 24 hours.  Take frequent rest periods for the next 24 hours.  Walking will help get rid of the air and reduce the bloated feeling in your belly (abdomen).  No driving for 24 hours (because of the medicine (anesthesia) used during the test).   Do not sign any important legal documents or operate any machinery for 24 hours (because of the anesthesia used during the test).  NUTRITION Drink plenty of fluids.  You may resume your normal diet as instructed by your doctor.  Begin with a light meal and progress to your normal diet. Heavy or fried foods are harder to digest and may make you feel sick to your stomach (nauseated).  Avoid alcoholic beverages for 24 hours or as instructed.  MEDICATIONS You may resume your normal medications unless your doctor tells you otherwise.  WHAT YOU CAN EXPECT TODAY Some feelings of bloating in the abdomen.  Passage of more gas than usual.  Spotting of blood in your stool or on the toilet paper.  IF YOU HAD POLYPS REMOVED DURING THE COLONOSCOPY: No aspirin products for 7 days or as instructed.  No alcohol for 7 days or as instructed.  Eat a soft diet for the next 24 hours.  FINDING OUT THE RESULTS OF YOUR TEST Not all test results are  available during your visit. If your test results are not back during the visit, make an appointment with your caregiver to find out the results. Do not assume everything is normal if you have not heard from your caregiver or the medical facility. It is important for you to follow up on all of your test results.  SEEK IMMEDIATE MEDICAL ATTENTION IF: You have more than a spotting of  blood in your stool.  Your belly is swollen (abdominal distention).  You are nauseated or vomiting.  You have a temperature over 101.  You have abdominal pain or discomfort that is severe or gets worse throughout the day.   Your EGD revealed mild amount inflammation in your stomach.  I took biopsies of this to rule out infection with a bacteria called H. pylori.  Await pathology results, my office will contact you.  You did have a tightening in your esophagus so I stretched this with a balloon.  Hopefully this helps with your swallowing.  I am going to increase your omeprazole to 20 mg twice daily.  Your colonoscopy revealed 8 polyp(s) which I removed successfully. Await pathology results, my office will contact you. I recommend repeating colonoscopy in 1-3 years for surveillance purposes.   Follow up with GI in 4-6 months.    I hope you have a great rest of your week!  [name] Abbey Chatters, D.O. Gastroenterology and Hepatology [facility]

## 2020-12-07 NOTE — Anesthesia Postprocedure Evaluation (Signed)
Anesthesia Post Note  Patient: Kyle Lowery  Procedure(s) Performed: COLONOSCOPY WITH PROPOFOL ESOPHAGOGASTRODUODENOSCOPY (EGD) WITH PROPOFOL BALLOON DILATION BIOPSY POLYPECTOMY  Patient location during evaluation: Phase II Anesthesia Type: General Level of consciousness: awake Pain management: pain level controlled Vital Signs Assessment: post-procedure vital signs reviewed and stable Respiratory status: spontaneous breathing and respiratory function stable Cardiovascular status: blood pressure returned to baseline and stable Postop Assessment: no headache and no apparent nausea or vomiting Anesthetic complications: no Comments: Late entry   No notable events documented.   Last Vitals:  Vitals:   12/07/20 1119 12/07/20 1326  BP: (!) 151/61 (!) 124/98  Pulse: 63 68  Resp: (!) 22   Temp: 36.6 C 36.4 C  SpO2: 97% 97%    Last Pain:  Vitals:   12/07/20 1326  TempSrc: Oral  PainSc: 0-No pain                 Louann Sjogren

## 2020-12-10 LAB — SURGICAL PATHOLOGY

## 2020-12-15 ENCOUNTER — Encounter: Payer: Self-pay | Admitting: Urology

## 2020-12-15 ENCOUNTER — Other Ambulatory Visit: Payer: Self-pay

## 2020-12-15 ENCOUNTER — Ambulatory Visit (INDEPENDENT_AMBULATORY_CARE_PROVIDER_SITE_OTHER): Payer: BC Managed Care – PPO | Admitting: Urology

## 2020-12-15 VITALS — BP 161/64 | HR 60 | Temp 98.1°F

## 2020-12-15 DIAGNOSIS — R3129 Other microscopic hematuria: Secondary | ICD-10-CM

## 2020-12-15 DIAGNOSIS — R339 Retention of urine, unspecified: Secondary | ICD-10-CM

## 2020-12-15 DIAGNOSIS — N138 Other obstructive and reflux uropathy: Secondary | ICD-10-CM

## 2020-12-15 DIAGNOSIS — R350 Frequency of micturition: Secondary | ICD-10-CM | POA: Diagnosis not present

## 2020-12-15 DIAGNOSIS — N401 Enlarged prostate with lower urinary tract symptoms: Secondary | ICD-10-CM

## 2020-12-15 LAB — MICROSCOPIC EXAMINATION
Bacteria, UA: NONE SEEN
Epithelial Cells (non renal): NONE SEEN /hpf (ref 0–10)
Renal Epithel, UA: NONE SEEN /hpf
WBC, UA: NONE SEEN /hpf (ref 0–5)

## 2020-12-15 LAB — URINALYSIS, ROUTINE W REFLEX MICROSCOPIC
Bilirubin, UA: NEGATIVE
Glucose, UA: NEGATIVE
Ketones, UA: NEGATIVE
Leukocytes,UA: NEGATIVE
Nitrite, UA: NEGATIVE
Protein,UA: NEGATIVE
Specific Gravity, UA: 1.02 (ref 1.005–1.030)
Urobilinogen, Ur: 0.2 mg/dL (ref 0.2–1.0)
pH, UA: 7 (ref 5.0–7.5)

## 2020-12-15 MED ORDER — SOLIFENACIN SUCCINATE 5 MG PO TABS: 5.0000 mg | ORAL_TABLET | Freq: Every day | ORAL | 11 refills | Status: AC

## 2020-12-15 MED ORDER — CIPROFLOXACIN HCL 500 MG PO TABS
500.0000 mg | ORAL_TABLET | Freq: Once | ORAL | Status: AC
  Administered 2020-12-15: 500 mg via ORAL

## 2020-12-15 MED ORDER — CIPROFLOXACIN HCL 500 MG PO TABS: 500.0000 mg | ORAL_TABLET | Freq: Once | ORAL | Status: DC

## 2020-12-15 NOTE — Progress Notes (Signed)
Assessment: 1. Microscopic hematuria   2. BPH with obstruction/lower urinary tract symptoms   3. Urinary frequency    Plan: Cystoscopy results discussed with the patient today. Free and total PSA today Cipro x 1 following cystoscopy Continue tamsulosin Trial of Vesicare 5 mg daily.  Rx sent. Discontinue Myrbetriq Return to office in 1 month  Chief Complaint:  Chief Complaint  Patient presents with   Hematuria     History of Present Illness:  Kyle Lowery is a 70 y.o. year old male who is seen for further evaluation of abnormal CT results.  He recently had a CT scan of the abdomen and pelvis for evaluation of abdominal pain with unintentional weight loss.  The CT from 11/17/20 showed bilateral subcentimeter hypodense renal lesions likely representing cyst, symmetrical wall thickening of an incompletely distended urinary bladder, and heterogeneous enhancement of the prostate gland.  He reports a >73-month history of lower urinary tract symptoms including frequency, nocturia, urgency, and urge incontinence.  He is voiding approximately every hour.  No dysuria or gross hematuria.  He was recently started on tamsulosin but has not noticed any change in his symptoms.  He reports intermittent abdominal pain, generally associated with eating or drinking.  He also reports a 15-20 pound weight loss in the past 6 months.  No prior history of UTIs or prostatitis.  No history of kidney stones. AUA score = 18. PVR = 4 ml.  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  He is on Eliquis for a. Fib. U/A from last visit showed >30 RBCs.  Urine culture showed <10K colonies.  He presents today for cystoscopy. He was given a trial of Myrbetriq 25 mg daily at his last visit.  He has not seen any improvement in his frequency.  He continues to void approximately every hour, and has urgency and urge incontinence.  No dysuria or gross hematuria.  Portions of the  above documentation were copied from a prior visit for review purposes only.   Past Medical History:  Past Medical History:  Diagnosis Date   Carotid artery stenosis    COPD (chronic obstructive pulmonary disease) (HCC)    Coronary atherosclerosis of native coronary artery    Mild to moderate NOCAD with 80% ostial diagonal 1/12   Depression    Prior suicide attempt   Essential hypertension    GERD (gastroesophageal reflux disease)    Esophageal dilatation   Mixed hyperlipidemia    Paroxysmal atrial fibrillation (HCC)    Pneumonia due to Pseudomonas aeruginosa ([place])    Syncope    Neurocardiogenic    Past Surgical History:  Past Surgical History:  Procedure Laterality Date   CARDIAC CATHETERIZATION     COLONOSCOPY  2005   3 polyps, no path available   ESOPHAGOGASTRODUODENOSCOPY (EGD) WITH ESOPHAGEAL DILATION  2005   normal esophagus s/p dilation, antral gastritis and pyloric channel inflamed appearing. no path available   INGUINAL HERNIA REPAIR Left    Left orchiectomy     VASECTOMY      Allergies:  No Known Allergies  Family History:  Family History  Problem Relation Age of Onset   Hypertension Mother    Melanoma Mother    Depression Father        Suicide age 7   Hypercholesterolemia Father    Hypertension Father    Hypertension Brother    Lung disease Neg Hx    Colon cancer Neg Hx  Social History:  Social History   Tobacco Use   Smoking status: Every Day    Packs/day: 1.00    Years: 53.00    Pack years: 53.00    Types: Cigarettes    Start date: 08/23/1963    Last attempt to quit: 06/08/2018    Years since quitting: 2.5   Smokeless tobacco: Never   Tobacco comments:    Peak rate of 2ppd  Substance Use Topics   Alcohol use: Yes    Comment: "drink one once in a while"   Drug use: Yes    Types: Marijuana    ROS: Constitutional:  Negative for fever, chills, weight loss CV: Negative for chest pain, previous MI, hypertension Respiratory:   Negative for shortness of breath, wheezing, sleep apnea, frequent cough GI:  Negative for nausea, vomiting, bloody stool, GERD  Physical exam: BP (!) 161/64   Pulse 60   Temp 98.1 F (36.7 C)  GENERAL APPEARANCE:  Well appearing, well developed, well nourished, NAD HEENT:  Atraumatic, normocephalic, oropharynx clear NECK:  Supple without lymphadenopathy or thyromegaly ABDOMEN:  Soft, non-tender, no masses EXTREMITIES:  Moves all extremities well, without clubbing, cyanosis, or edema NEUROLOGIC:  Alert and oriented x 3, normal gait, CN II-XII grossly intact MENTAL STATUS:  appropriate BACK:  Non-tender to palpation, No CVAT SKIN:  Warm, dry, and intact  Results: U/A: 0-2 RBCs  Procedure:  Flexible Cystourethroscopy  Pre-operative Diagnosis: Microscopic hematuria  Post-operative Diagnosis: Microscopic hematuria  Anesthesia:  local with lidocaine jelly  Surgical Narrative:  After appropriate informed consent was obtained, the patient was prepped and draped in the usual sterile fashion in the supine position.  He was correctly identified and the proper procedure delineated prior to proceeding.  Sterile lidocaine gel was instilled in the urethra. The flexible cystoscope was introduced without difficulty.  Findings:  Anterior urethra: Normal  Posterior urethra:  No significant enlargement of the prostate  Bladder:  1+ trabeculations; no mucosal lesions  Ureteral orifices: normal  Additional findings: none  Saline bladder wash for cytology was not performed.    The cystoscope was then removed.  The patient tolerated the procedure well.

## 2020-12-15 NOTE — Progress Notes (Signed)
Urological Symptom Review  Patient is experiencing the following symptoms: Frequent urination Hard to postpone urination Get up at night to urinate Leakage of urine   Review of Systems  Gastrointestinal (upper)  : Negative for upper GI symptoms  Gastrointestinal (lower) : Negative for lower GI symptoms  Constitutional : Weight loss  Skin: Negative for skin symptoms  Eyes: Negative for eye symptoms  Ear/Nose/Throat : Sinus problems  Hematologic/Lymphatic: Negative for Hematologic/Lymphatic symptoms  Cardiovascular : Negative for cardiovascular symptoms  Respiratory : Shortness of breath  Endocrine: Excessive thirst  Musculoskeletal: Back pain  Neurological: Negative for neurological symptoms  Psychologic: Negative for psychiatric symptoms

## 2020-12-16 LAB — PSA, TOTAL AND FREE
PSA, Free Pct: 22 %
PSA, Free: 0.33 ng/mL
Prostate Specific Ag, Serum: 1.5 ng/mL (ref 0.0–4.0)

## 2020-12-17 NOTE — Progress Notes (Signed)
Sent via mail 

## 2020-12-19 DIAGNOSIS — U071 COVID-19: Secondary | ICD-10-CM | POA: Diagnosis not present

## 2020-12-28 ENCOUNTER — Encounter (HOSPITAL_COMMUNITY): Payer: Self-pay | Admitting: Internal Medicine

## 2020-12-28 DIAGNOSIS — E7800 Pure hypercholesterolemia, unspecified: Secondary | ICD-10-CM | POA: Diagnosis not present

## 2020-12-28 DIAGNOSIS — J1089 Influenza due to other identified influenza virus with other manifestations: Secondary | ICD-10-CM | POA: Diagnosis not present

## 2020-12-28 DIAGNOSIS — R079 Chest pain, unspecified: Secondary | ICD-10-CM | POA: Diagnosis not present

## 2020-12-28 DIAGNOSIS — Z720 Tobacco use: Secondary | ICD-10-CM | POA: Diagnosis not present

## 2020-12-28 DIAGNOSIS — R059 Cough, unspecified: Secondary | ICD-10-CM | POA: Diagnosis not present

## 2020-12-28 DIAGNOSIS — F17210 Nicotine dependence, cigarettes, uncomplicated: Secondary | ICD-10-CM | POA: Diagnosis not present

## 2020-12-28 DIAGNOSIS — I10 Essential (primary) hypertension: Secondary | ICD-10-CM | POA: Diagnosis not present

## 2020-12-28 DIAGNOSIS — R0602 Shortness of breath: Secondary | ICD-10-CM | POA: Diagnosis not present

## 2020-12-28 DIAGNOSIS — J101 Influenza due to other identified influenza virus with other respiratory manifestations: Secondary | ICD-10-CM | POA: Diagnosis not present

## 2020-12-28 DIAGNOSIS — J439 Emphysema, unspecified: Secondary | ICD-10-CM | POA: Diagnosis not present

## 2020-12-28 DIAGNOSIS — I2510 Atherosclerotic heart disease of native coronary artery without angina pectoris: Secondary | ICD-10-CM | POA: Diagnosis not present

## 2021-01-05 DIAGNOSIS — R059 Cough, unspecified: Secondary | ICD-10-CM | POA: Diagnosis not present

## 2021-01-05 DIAGNOSIS — J9600 Acute respiratory failure, unspecified whether with hypoxia or hypercapnia: Secondary | ICD-10-CM | POA: Diagnosis not present

## 2021-01-05 DIAGNOSIS — J9811 Atelectasis: Secondary | ICD-10-CM | POA: Diagnosis not present

## 2021-01-05 DIAGNOSIS — D72829 Elevated white blood cell count, unspecified: Secondary | ICD-10-CM | POA: Diagnosis not present

## 2021-01-05 DIAGNOSIS — E7800 Pure hypercholesterolemia, unspecified: Secondary | ICD-10-CM | POA: Diagnosis not present

## 2021-01-05 DIAGNOSIS — J440 Chronic obstructive pulmonary disease with acute lower respiratory infection: Secondary | ICD-10-CM | POA: Diagnosis not present

## 2021-01-05 DIAGNOSIS — D696 Thrombocytopenia, unspecified: Secondary | ICD-10-CM | POA: Diagnosis not present

## 2021-01-05 DIAGNOSIS — J441 Chronic obstructive pulmonary disease with (acute) exacerbation: Secondary | ICD-10-CM | POA: Diagnosis not present

## 2021-01-05 DIAGNOSIS — I480 Paroxysmal atrial fibrillation: Secondary | ICD-10-CM | POA: Diagnosis not present

## 2021-01-05 DIAGNOSIS — Z20822 Contact with and (suspected) exposure to covid-19: Secondary | ICD-10-CM | POA: Diagnosis not present

## 2021-01-05 DIAGNOSIS — K5900 Constipation, unspecified: Secondary | ICD-10-CM | POA: Diagnosis not present

## 2021-01-05 DIAGNOSIS — N3001 Acute cystitis with hematuria: Secondary | ICD-10-CM | POA: Diagnosis not present

## 2021-01-05 DIAGNOSIS — E46 Unspecified protein-calorie malnutrition: Secondary | ICD-10-CM | POA: Diagnosis not present

## 2021-01-05 DIAGNOSIS — K219 Gastro-esophageal reflux disease without esophagitis: Secondary | ICD-10-CM | POA: Diagnosis not present

## 2021-01-05 DIAGNOSIS — J9601 Acute respiratory failure with hypoxia: Secondary | ICD-10-CM | POA: Diagnosis not present

## 2021-01-05 DIAGNOSIS — R918 Other nonspecific abnormal finding of lung field: Secondary | ICD-10-CM | POA: Diagnosis not present

## 2021-01-05 DIAGNOSIS — F17210 Nicotine dependence, cigarettes, uncomplicated: Secondary | ICD-10-CM | POA: Diagnosis not present

## 2021-01-05 DIAGNOSIS — R1310 Dysphagia, unspecified: Secondary | ICD-10-CM | POA: Diagnosis not present

## 2021-01-05 DIAGNOSIS — I4892 Unspecified atrial flutter: Secondary | ICD-10-CM | POA: Diagnosis not present

## 2021-01-05 DIAGNOSIS — E870 Hyperosmolality and hypernatremia: Secondary | ICD-10-CM | POA: Diagnosis not present

## 2021-01-05 DIAGNOSIS — E876 Hypokalemia: Secondary | ICD-10-CM | POA: Diagnosis not present

## 2021-01-05 DIAGNOSIS — J09X1 Influenza due to identified novel influenza A virus with pneumonia: Secondary | ICD-10-CM | POA: Diagnosis not present

## 2021-01-05 DIAGNOSIS — J189 Pneumonia, unspecified organism: Secondary | ICD-10-CM | POA: Diagnosis not present

## 2021-01-05 DIAGNOSIS — R0602 Shortness of breath: Secondary | ICD-10-CM | POA: Diagnosis not present

## 2021-01-05 DIAGNOSIS — F0390 Unspecified dementia without behavioral disturbance: Secondary | ICD-10-CM | POA: Diagnosis not present

## 2021-01-05 DIAGNOSIS — I4891 Unspecified atrial fibrillation: Secondary | ICD-10-CM | POA: Diagnosis not present

## 2021-01-05 DIAGNOSIS — J1100 Influenza due to unidentified influenza virus with unspecified type of pneumonia: Secondary | ICD-10-CM | POA: Diagnosis not present

## 2021-01-05 DIAGNOSIS — M419 Scoliosis, unspecified: Secondary | ICD-10-CM | POA: Diagnosis not present

## 2021-01-05 DIAGNOSIS — U071 COVID-19: Secondary | ICD-10-CM | POA: Diagnosis not present

## 2021-01-05 DIAGNOSIS — J1000 Influenza due to other identified influenza virus with unspecified type of pneumonia: Secondary | ICD-10-CM | POA: Diagnosis not present

## 2021-01-05 DIAGNOSIS — E43 Unspecified severe protein-calorie malnutrition: Secondary | ICD-10-CM | POA: Diagnosis not present

## 2021-01-05 DIAGNOSIS — R1312 Dysphagia, oropharyngeal phase: Secondary | ICD-10-CM | POA: Diagnosis not present

## 2021-01-05 DIAGNOSIS — Z4682 Encounter for fitting and adjustment of non-vascular catheter: Secondary | ICD-10-CM | POA: Diagnosis not present

## 2021-01-05 DIAGNOSIS — J90 Pleural effusion, not elsewhere classified: Secondary | ICD-10-CM | POA: Diagnosis not present

## 2021-01-05 DIAGNOSIS — I469 Cardiac arrest, cause unspecified: Secondary | ICD-10-CM | POA: Diagnosis not present

## 2021-01-05 DIAGNOSIS — Z681 Body mass index (BMI) 19 or less, adult: Secondary | ICD-10-CM | POA: Diagnosis not present

## 2021-01-05 DIAGNOSIS — J9690 Respiratory failure, unspecified, unspecified whether with hypoxia or hypercapnia: Secondary | ICD-10-CM | POA: Diagnosis not present

## 2021-01-05 DIAGNOSIS — I10 Essential (primary) hypertension: Secondary | ICD-10-CM | POA: Diagnosis not present

## 2021-01-05 DIAGNOSIS — R7989 Other specified abnormal findings of blood chemistry: Secondary | ICD-10-CM | POA: Diagnosis not present

## 2021-01-05 DIAGNOSIS — R6520 Severe sepsis without septic shock: Secondary | ICD-10-CM | POA: Diagnosis not present

## 2021-01-05 DIAGNOSIS — E440 Moderate protein-calorie malnutrition: Secondary | ICD-10-CM | POA: Diagnosis not present

## 2021-01-05 DIAGNOSIS — E785 Hyperlipidemia, unspecified: Secondary | ICD-10-CM | POA: Diagnosis not present

## 2021-01-05 DIAGNOSIS — G9341 Metabolic encephalopathy: Secondary | ICD-10-CM | POA: Diagnosis not present

## 2021-01-05 DIAGNOSIS — J439 Emphysema, unspecified: Secondary | ICD-10-CM | POA: Diagnosis not present

## 2021-01-05 DIAGNOSIS — Z9911 Dependence on respirator [ventilator] status: Secondary | ICD-10-CM | POA: Diagnosis not present

## 2021-01-05 DIAGNOSIS — J1089 Influenza due to other identified influenza virus with other manifestations: Secondary | ICD-10-CM | POA: Diagnosis not present

## 2021-01-05 DIAGNOSIS — R0902 Hypoxemia: Secondary | ICD-10-CM | POA: Diagnosis not present

## 2021-01-05 DIAGNOSIS — I2510 Atherosclerotic heart disease of native coronary artery without angina pectoris: Secondary | ICD-10-CM | POA: Diagnosis not present

## 2021-01-05 DIAGNOSIS — Z8679 Personal history of other diseases of the circulatory system: Secondary | ICD-10-CM | POA: Diagnosis not present

## 2021-01-05 DIAGNOSIS — I160 Hypertensive urgency: Secondary | ICD-10-CM | POA: Diagnosis not present

## 2021-01-05 DIAGNOSIS — J156 Pneumonia due to other aerobic Gram-negative bacteria: Secondary | ICD-10-CM | POA: Diagnosis not present

## 2021-01-05 DIAGNOSIS — J984 Other disorders of lung: Secondary | ICD-10-CM | POA: Diagnosis not present

## 2021-01-05 DIAGNOSIS — I429 Cardiomyopathy, unspecified: Secondary | ICD-10-CM | POA: Diagnosis not present

## 2021-01-05 DIAGNOSIS — A419 Sepsis, unspecified organism: Secondary | ICD-10-CM | POA: Diagnosis not present

## 2021-01-06 ENCOUNTER — Ambulatory Visit: Payer: BC Managed Care – PPO | Admitting: Gastroenterology

## 2021-01-14 ENCOUNTER — Ambulatory Visit: Payer: BC Managed Care – PPO | Admitting: Urology

## 2021-01-14 DIAGNOSIS — R350 Frequency of micturition: Secondary | ICD-10-CM

## 2021-01-14 DIAGNOSIS — R3129 Other microscopic hematuria: Secondary | ICD-10-CM

## 2021-01-14 NOTE — Progress Notes (Deleted)
Assessment: 1. Urinary frequency   2. Microscopic hematuria   3. BPH with obstruction/lower urinary tract symptoms    Plan: Free and total PSA today Continue tamsulosin Trial of Vesicare 5 mg daily.  Rx sent. Return to office in 1 month  Chief Complaint:  No chief complaint on file.    History of Present Illness:  Kyle Lowery is a 70 y.o. year old male who is seen for further evaluation of abnormal CT results, microscopic hematuria, BPH with lower urinary tract symptoms.   He had a CT scan of the abdomen and pelvis for evaluation of abdominal pain with unintentional weight loss.  The CT from 11/17/20 showed bilateral subcentimeter hypodense renal lesions likely representing cyst, symmetrical wall thickening of an incompletely distended urinary bladder, and heterogeneous enhancement of the prostate gland.  He reported a >38-month history of lower urinary tract symptoms including frequency, nocturia, urgency, and urge incontinence.  He is voiding approximately every hour.  No dysuria or gross hematuria.  He was recently started on tamsulosin but has not noticed any change in his symptoms.  He reports intermittent abdominal pain, generally associated with eating or drinking.  He also reports a 15-20 pound weight loss in the past 6 months.  No prior history of UTIs or prostatitis.  No history of kidney stones. AUA score = 18. PVR = 4 ml.  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  He is on Eliquis for a. Fib. U/A showed >30 RBCs.  Urine culture showed <10K colonies. He was evaluated with cystoscopy 12/15/2020 which showed no mucosal lesions within the bladder.  He was given a trial of Myrbetriq 25 mg daily but did not see any improvement in his frequency.  He continued to void approximately every hour, and has urgency and urge incontinence.  No dysuria or gross hematuria. He was given a trial of Vesicare 5 mg daily at his visit in  11/22.  PSA from 11/22: 1.5 with 22% free  Portions of the above documentation were copied from a prior visit for review purposes only.   Past Medical History:  Past Medical History:  Diagnosis Date   Carotid artery stenosis    COPD (chronic obstructive pulmonary disease) (HCC)    Coronary atherosclerosis of native coronary artery    Mild to moderate NOCAD with 80% ostial diagonal 1/12   Depression    Prior suicide attempt   Essential hypertension    GERD (gastroesophageal reflux disease)    Esophageal dilatation   Mixed hyperlipidemia    Paroxysmal atrial fibrillation (HCC)    Pneumonia due to Pseudomonas aeruginosa ([place])    Syncope    Neurocardiogenic    Past Surgical History:  Past Surgical History:  Procedure Laterality Date   BALLOON DILATION N/A 12/07/2020   Procedure: BALLOON DILATION;  Surgeon: Eloise Harman, DO;  [facility];  Service: Endoscopy;  Laterality: N/A;   BIOPSY  12/07/2020   Procedure: BIOPSY;  Surgeon: Eloise Harman, DO;  [facility]: [facility];  Service: Endoscopy;;   CARDIAC CATHETERIZATION     COLONOSCOPY  2005   3 polyps, no path available   COLONOSCOPY WITH PROPOFOL N/A 12/07/2020   Procedure: COLONOSCOPY WITH PROPOFOL;  Surgeon: Eloise Harman, DO;  Location: [facility];  Service: Endoscopy;  Laterality: N/A;  12:45pm   ESOPHAGOGASTRODUODENOSCOPY (EGD) WITH ESOPHAGEAL DILATION  2005   normal esophagus s/p dilation, antral gastritis and pyloric channel inflamed appearing. no  path available   ESOPHAGOGASTRODUODENOSCOPY (EGD) WITH PROPOFOL N/A 12/07/2020   Procedure: ESOPHAGOGASTRODUODENOSCOPY (EGD) WITH PROPOFOL;  Surgeon: Eloise Harman, DO;  [facility]: [facility];  Service: Endoscopy;  Laterality: N/A;   INGUINAL HERNIA REPAIR Left    Left orchiectomy     POLYPECTOMY  12/07/2020   Procedure: POLYPECTOMY;  Surgeon: Eloise Harman, DO;  [facility]: [facility];  Service: Endoscopy;;   VASECTOMY       Allergies:  No Known Allergies  Family History:  Family History  Problem Relation Age of Onset   Hypertension Mother    Melanoma Mother    Depression Father        Suicide age 49   Hypercholesterolemia Father    Hypertension Father    Hypertension Brother    Lung disease Neg Hx    Colon cancer Neg Hx     Social History:  Social History   Tobacco Use   Smoking status: Every Day    Packs/day: 1.00    Years: 53.00    Pack years: 53.00    Types: Cigarettes    Start date: 08/23/1963    Last attempt to quit: 06/08/2018    Years since quitting: 2.6   Smokeless tobacco: Never   Tobacco comments:    Peak rate of 2ppd  Substance Use Topics   Alcohol use: Yes    Comment: "drink one once in a while"   Drug use: Yes    Types: Marijuana    ROS: Constitutional:  Negative for fever, chills, weight loss CV: Negative for chest pain, previous MI, hypertension Respiratory:  Negative for shortness of breath, wheezing, sleep apnea, frequent cough GI:  Negative for nausea, vomiting, bloody stool, GERD  Physical exam: There were no vitals taken for this visit. GENERAL APPEARANCE:  Well appearing, well developed, well nourished, NAD HEENT:  Atraumatic, normocephalic, oropharynx clear NECK:  Supple without lymphadenopathy or thyromegaly ABDOMEN:  Soft, non-tender, no masses EXTREMITIES:  Moves all extremities well, without clubbing, cyanosis, or edema NEUROLOGIC:  Alert and oriented x 3, normal gait, CN II-XII grossly intact MENTAL STATUS:  appropriate BACK:  Non-tender to palpation, No CVAT SKIN:  Warm, dry, and intact  Results: ***

## 2021-02-07 DEATH — deceased

## 2021-02-18 DIAGNOSIS — U071 COVID-19: Secondary | ICD-10-CM | POA: Diagnosis not present

## 2021-05-31 ENCOUNTER — Ambulatory Visit: Payer: BC Managed Care – PPO | Admitting: Gastroenterology

## 2021-12-07 ENCOUNTER — Encounter: Payer: Self-pay | Admitting: *Deleted

## 2021-12-23 ENCOUNTER — Telehealth: Payer: Self-pay | Admitting: Cardiology

## 2022-01-17 NOTE — Telephone Encounter (Signed)
ERROR
